# Patient Record
Sex: Male | Born: 1960 | Race: Black or African American | Hispanic: No | Marital: Single | Smoking: Former smoker
Health system: Southern US, Community
[De-identification: ages and names within clinical notes are randomized; demographics above are authoritative.]

## PROBLEM LIST (undated history)

## (undated) DIAGNOSIS — R739 Hyperglycemia, unspecified: Secondary | ICD-10-CM

## (undated) DIAGNOSIS — F17200 Nicotine dependence, unspecified, uncomplicated: Secondary | ICD-10-CM

## (undated) DIAGNOSIS — I77810 Thoracic aortic ectasia: Secondary | ICD-10-CM

## (undated) DIAGNOSIS — I2510 Atherosclerotic heart disease of native coronary artery without angina pectoris: Secondary | ICD-10-CM

## (undated) DIAGNOSIS — E785 Hyperlipidemia, unspecified: Secondary | ICD-10-CM

## (undated) DIAGNOSIS — R569 Unspecified convulsions: Secondary | ICD-10-CM

## (undated) DIAGNOSIS — I219 Acute myocardial infarction, unspecified: Secondary | ICD-10-CM

## (undated) DIAGNOSIS — I10 Essential (primary) hypertension: Secondary | ICD-10-CM

## (undated) DIAGNOSIS — Z91199 Patient's noncompliance with other medical treatment and regimen due to unspecified reason: Secondary | ICD-10-CM

## (undated) DIAGNOSIS — E119 Type 2 diabetes mellitus without complications: Secondary | ICD-10-CM

## (undated) DIAGNOSIS — D689 Coagulation defect, unspecified: Secondary | ICD-10-CM

## (undated) DIAGNOSIS — Z9119 Patient's noncompliance with other medical treatment and regimen: Secondary | ICD-10-CM

## (undated) DIAGNOSIS — D649 Anemia, unspecified: Secondary | ICD-10-CM

## (undated) DIAGNOSIS — I5042 Chronic combined systolic (congestive) and diastolic (congestive) heart failure: Secondary | ICD-10-CM

## (undated) DIAGNOSIS — G40909 Epilepsy, unspecified, not intractable, without status epilepticus: Secondary | ICD-10-CM

## (undated) DIAGNOSIS — I38 Endocarditis, valve unspecified: Secondary | ICD-10-CM

## (undated) HISTORY — DX: Atherosclerotic heart disease of native coronary artery without angina pectoris: I25.10

## (undated) HISTORY — DX: Anemia, unspecified: D64.9

## (undated) HISTORY — DX: Thoracic aortic ectasia: I77.810

## (undated) HISTORY — DX: Coagulation defect, unspecified: D68.9

## (undated) HISTORY — DX: Endocarditis, valve unspecified: I38

## (undated) HISTORY — DX: Epilepsy, unspecified, not intractable, without status epilepticus: G40.909

## (undated) HISTORY — DX: Hyperlipidemia, unspecified: E78.5

## (undated) HISTORY — DX: Patient's noncompliance with other medical treatment and regimen due to unspecified reason: Z91.199

## (undated) HISTORY — DX: Chronic combined systolic (congestive) and diastolic (congestive) heart failure: I50.42

## (undated) HISTORY — PX: COLONOSCOPY: SHX174

## (undated) HISTORY — DX: Hyperglycemia, unspecified: R73.9

## (undated) HISTORY — DX: Patient's noncompliance with other medical treatment and regimen: Z91.19

---

## 1999-02-11 ENCOUNTER — Emergency Department (HOSPITAL_COMMUNITY): Admission: EM | Admit: 1999-02-11 | Discharge: 1999-02-12 | Payer: Self-pay | Admitting: Emergency Medicine

## 2000-03-27 ENCOUNTER — Emergency Department (HOSPITAL_COMMUNITY): Admission: EM | Admit: 2000-03-27 | Discharge: 2000-03-28 | Payer: Self-pay | Admitting: Emergency Medicine

## 2004-06-27 ENCOUNTER — Ambulatory Visit: Payer: Self-pay | Admitting: Nurse Practitioner

## 2004-07-31 ENCOUNTER — Ambulatory Visit: Payer: Self-pay | Admitting: Family Medicine

## 2004-10-05 ENCOUNTER — Ambulatory Visit: Payer: Self-pay | Admitting: *Deleted

## 2004-10-05 ENCOUNTER — Ambulatory Visit: Payer: Self-pay | Admitting: Family Medicine

## 2005-09-17 ENCOUNTER — Ambulatory Visit: Payer: Self-pay | Admitting: Family Medicine

## 2006-04-10 ENCOUNTER — Emergency Department (HOSPITAL_COMMUNITY): Admission: EM | Admit: 2006-04-10 | Discharge: 2006-04-11 | Payer: Self-pay | Admitting: Emergency Medicine

## 2007-10-22 HISTORY — PX: CORONARY ANGIOPLASTY WITH STENT PLACEMENT: SHX49

## 2007-10-28 ENCOUNTER — Ambulatory Visit: Payer: Self-pay | Admitting: Cardiology

## 2007-10-28 ENCOUNTER — Emergency Department (HOSPITAL_COMMUNITY): Admission: EM | Admit: 2007-10-28 | Discharge: 2007-10-28 | Payer: Self-pay | Admitting: *Deleted

## 2007-10-28 ENCOUNTER — Inpatient Hospital Stay (HOSPITAL_COMMUNITY): Admission: EM | Admit: 2007-10-28 | Discharge: 2007-10-30 | Payer: Self-pay | Admitting: Emergency Medicine

## 2007-11-11 ENCOUNTER — Ambulatory Visit: Payer: Self-pay | Admitting: Cardiovascular Disease

## 2008-01-18 ENCOUNTER — Ambulatory Visit: Payer: Self-pay | Admitting: Cardiovascular Disease

## 2008-03-05 ENCOUNTER — Emergency Department (HOSPITAL_COMMUNITY): Admission: EM | Admit: 2008-03-05 | Discharge: 2008-03-05 | Payer: Self-pay | Admitting: Emergency Medicine

## 2008-04-21 ENCOUNTER — Emergency Department (HOSPITAL_COMMUNITY): Admission: EM | Admit: 2008-04-21 | Discharge: 2008-04-22 | Payer: Self-pay | Admitting: Emergency Medicine

## 2008-06-22 ENCOUNTER — Ambulatory Visit: Payer: Self-pay | Admitting: Internal Medicine

## 2008-06-22 LAB — CONVERTED CEMR LAB
ALT: 18 units/L (ref 0–53)
AST: 30 units/L (ref 0–37)
Albumin: 3.4 g/dL — ABNORMAL LOW (ref 3.5–5.2)
Bilirubin, Direct: 0.1 mg/dL (ref 0.0–0.3)
Cholesterol: 126 mg/dL (ref 0–200)
HDL: 43.6 mg/dL (ref >39.0)
Total CHOL/HDL Ratio: 2.9
Total Protein: 7.3 g/dL (ref 6.0–8.3)
Triglycerides: 41 mg/dL (ref 0–149)
VLDL: 8 mg/dL (ref 0–40)

## 2008-08-11 ENCOUNTER — Emergency Department (HOSPITAL_COMMUNITY): Admission: EM | Admit: 2008-08-11 | Discharge: 2008-08-11 | Payer: Self-pay | Admitting: Emergency Medicine

## 2008-08-15 ENCOUNTER — Emergency Department (HOSPITAL_COMMUNITY): Admission: EM | Admit: 2008-08-15 | Discharge: 2008-08-15 | Payer: Self-pay | Admitting: *Deleted

## 2008-09-29 ENCOUNTER — Emergency Department (HOSPITAL_COMMUNITY): Admission: EM | Admit: 2008-09-29 | Discharge: 2008-09-29 | Payer: Self-pay | Admitting: Emergency Medicine

## 2008-12-05 ENCOUNTER — Ambulatory Visit: Payer: Self-pay | Admitting: Cardiovascular Disease

## 2008-12-05 ENCOUNTER — Emergency Department (HOSPITAL_COMMUNITY): Admission: EM | Admit: 2008-12-05 | Discharge: 2008-12-05 | Payer: Self-pay | Admitting: Emergency Medicine

## 2008-12-30 ENCOUNTER — Emergency Department (HOSPITAL_COMMUNITY): Admission: EM | Admit: 2008-12-30 | Discharge: 2008-12-30 | Payer: Self-pay | Admitting: Emergency Medicine

## 2009-02-04 ENCOUNTER — Inpatient Hospital Stay (HOSPITAL_COMMUNITY): Admission: EM | Admit: 2009-02-04 | Discharge: 2009-02-05 | Payer: Self-pay | Admitting: Emergency Medicine

## 2009-02-04 ENCOUNTER — Ambulatory Visit: Payer: Self-pay | Admitting: Cardiology

## 2009-02-06 ENCOUNTER — Telehealth: Payer: Self-pay | Admitting: Cardiovascular Disease

## 2009-02-06 DIAGNOSIS — E7800 Pure hypercholesterolemia, unspecified: Secondary | ICD-10-CM | POA: Insufficient documentation

## 2009-02-06 DIAGNOSIS — R569 Unspecified convulsions: Secondary | ICD-10-CM

## 2009-02-07 ENCOUNTER — Encounter: Payer: Self-pay | Admitting: Cardiovascular Disease

## 2009-02-07 ENCOUNTER — Ambulatory Visit: Payer: Self-pay | Admitting: Cardiovascular Disease

## 2009-02-08 ENCOUNTER — Ambulatory Visit: Payer: Self-pay | Admitting: Cardiovascular Disease

## 2009-02-08 ENCOUNTER — Inpatient Hospital Stay (HOSPITAL_COMMUNITY): Admission: RE | Admit: 2009-02-08 | Discharge: 2009-02-09 | Payer: Self-pay | Admitting: Cardiovascular Disease

## 2009-02-22 ENCOUNTER — Ambulatory Visit: Payer: Self-pay | Admitting: Cardiovascular Disease

## 2009-02-22 ENCOUNTER — Encounter: Payer: Self-pay | Admitting: Physician Assistant

## 2009-02-22 DIAGNOSIS — Z87891 Personal history of nicotine dependence: Secondary | ICD-10-CM | POA: Insufficient documentation

## 2009-02-22 DIAGNOSIS — F17200 Nicotine dependence, unspecified, uncomplicated: Secondary | ICD-10-CM

## 2009-02-22 DIAGNOSIS — Z720 Tobacco use: Secondary | ICD-10-CM

## 2009-02-22 HISTORY — DX: Nicotine dependence, unspecified, uncomplicated: F17.200

## 2009-03-16 ENCOUNTER — Emergency Department (HOSPITAL_COMMUNITY): Admission: EM | Admit: 2009-03-16 | Discharge: 2009-03-16 | Payer: Self-pay | Admitting: Emergency Medicine

## 2009-04-30 ENCOUNTER — Emergency Department (HOSPITAL_COMMUNITY): Admission: EM | Admit: 2009-04-30 | Discharge: 2009-04-30 | Payer: Self-pay | Admitting: Emergency Medicine

## 2009-05-06 ENCOUNTER — Emergency Department (HOSPITAL_COMMUNITY): Admission: EM | Admit: 2009-05-06 | Discharge: 2009-05-06 | Payer: Self-pay | Admitting: Emergency Medicine

## 2009-05-08 ENCOUNTER — Emergency Department (HOSPITAL_COMMUNITY): Admission: EM | Admit: 2009-05-08 | Discharge: 2009-05-08 | Payer: Self-pay | Admitting: Emergency Medicine

## 2009-05-09 ENCOUNTER — Encounter: Payer: Self-pay | Admitting: Internal Medicine

## 2009-06-13 ENCOUNTER — Ambulatory Visit: Payer: Self-pay | Admitting: Cardiovascular Disease

## 2009-06-16 ENCOUNTER — Encounter: Payer: Self-pay | Admitting: Cardiovascular Disease

## 2009-06-19 ENCOUNTER — Telehealth (INDEPENDENT_AMBULATORY_CARE_PROVIDER_SITE_OTHER): Payer: Self-pay | Admitting: *Deleted

## 2009-06-29 ENCOUNTER — Telehealth (INDEPENDENT_AMBULATORY_CARE_PROVIDER_SITE_OTHER): Payer: Self-pay | Admitting: *Deleted

## 2009-07-03 ENCOUNTER — Telehealth: Payer: Self-pay | Admitting: Cardiovascular Disease

## 2009-07-16 ENCOUNTER — Emergency Department (HOSPITAL_COMMUNITY): Admission: EM | Admit: 2009-07-16 | Discharge: 2009-07-16 | Payer: Self-pay | Admitting: Emergency Medicine

## 2009-09-25 ENCOUNTER — Telehealth: Payer: Self-pay | Admitting: Cardiovascular Disease

## 2010-01-22 ENCOUNTER — Telehealth (INDEPENDENT_AMBULATORY_CARE_PROVIDER_SITE_OTHER): Payer: Self-pay | Admitting: *Deleted

## 2010-03-22 ENCOUNTER — Emergency Department (HOSPITAL_COMMUNITY): Admission: EM | Admit: 2010-03-22 | Discharge: 2010-03-22 | Payer: Self-pay | Admitting: Emergency Medicine

## 2010-05-09 ENCOUNTER — Telehealth (INDEPENDENT_AMBULATORY_CARE_PROVIDER_SITE_OTHER): Payer: Self-pay | Admitting: *Deleted

## 2010-09-25 ENCOUNTER — Telehealth (INDEPENDENT_AMBULATORY_CARE_PROVIDER_SITE_OTHER): Payer: Self-pay | Admitting: *Deleted

## 2010-10-02 ENCOUNTER — Encounter: Payer: Self-pay | Admitting: Cardiovascular Disease

## 2010-10-11 ENCOUNTER — Encounter: Payer: Self-pay | Admitting: Cardiovascular Disease

## 2010-10-18 ENCOUNTER — Telehealth (INDEPENDENT_AMBULATORY_CARE_PROVIDER_SITE_OTHER): Payer: Self-pay | Admitting: *Deleted

## 2010-10-20 ENCOUNTER — Emergency Department (HOSPITAL_COMMUNITY)
Admission: EM | Admit: 2010-10-20 | Discharge: 2010-10-20 | Payer: Self-pay | Source: Home / Self Care | Admitting: Emergency Medicine

## 2010-11-20 NOTE — Progress Notes (Signed)
Summary: needs refill of pravix  Phone Note Refill Request   Refills Requested: Medication #1:  PLAVIX 75 MG TABS Take one tablet by mouth daily pt needs refill plavix with the patient assistance program-pls call when [phone]  Initial call taken by: Glynda Jaeger,  May 09, 2010 1:13 PM  Follow-up for Phone Call        Med. ordered Latanya Presser will arrive between the nex 5 to 7 business days Follow-up by: Vikki Ports,  May 09, 2010 2:04 PM     Appended Document: needs refill of pravix Plavix arrived in the office and placed at the front desk for pick-up.  Luz notified pt.  Order #[id number].

## 2010-11-20 NOTE — Progress Notes (Signed)
Summary: PT need his Plavix ordered  Phone Note Refill Request Message from:  Patient on January 22, 2010 4:51 PM  Refills Requested: Medication #1:  PLAVIX 75 MG TABS Take one tablet by mouth daily Pt calling to get Plavix ordered  Initial call taken by: Judie Grieve,  January 22, 2010 4:52 PM  Follow-up for Phone Call        Plavix ordered will arrive between the next 5 to 7 days. Follow-up by: Vikki Ports,  January 23, 2010 1:47 PM     Appended Document: PT need his Plavix ordered Plavix arrived.  I left a message on the pt's voicemail that plavix is at the front desk for pick-up. Order #[id number], Customer PO  number [phone].

## 2010-11-20 NOTE — Progress Notes (Signed)
Summary: plavix  Phone Note Refill Request Message from:  Patient on September 25, 2010 12:46 PM  Refills Requested: Medication #1:  PLAVIX 75 MG TABS Take one tablet by mouth daily patient assistance program-pls call when ready   Method Requested: Pick up at Office Initial call taken by: Roe Coombs,  September 25, 2010 12:47 PM  Follow-up for Phone Call        [facility] contacted. patient assistance expired on september/11. They  will fax a new application. Message left on patient's answer machine to pick up new form and fill it out. Vikki Ports  September 27, 2010 3:54 PM Patient will come next Monday to fill out form. Vikki Ports  September 27, 2010 4:45 PM

## 2010-11-22 NOTE — Progress Notes (Signed)
Summary: Plavix   Phone Note Outgoing Call   Summary of Call: (ORDER # [id number]) Plavix from [facility] placed at front desk. Message left patient for pick up. Vikki Ports  October 18, 2010 12:50 PM

## 2010-11-22 NOTE — Letter (Signed)
Summary: [facility]   Imported By: Marylou Mccoy 11/14/2010 09:45:10  _____________________________________________________________________  External Attachment:    Type:   Image     Comment:   External Document

## 2010-11-25 ENCOUNTER — Emergency Department (HOSPITAL_COMMUNITY)
Admission: EM | Admit: 2010-11-25 | Discharge: 2010-11-25 | Disposition: A | Discharge status: Home / Self Care | Payer: Self-pay | Attending: Emergency Medicine | Admitting: Emergency Medicine

## 2010-11-25 DIAGNOSIS — I252 Old myocardial infarction: Secondary | ICD-10-CM | POA: Insufficient documentation

## 2010-11-25 DIAGNOSIS — Z79899 Other long term (current) drug therapy: Secondary | ICD-10-CM | POA: Insufficient documentation

## 2010-11-25 DIAGNOSIS — G40909 Epilepsy, unspecified, not intractable, without status epilepticus: Secondary | ICD-10-CM | POA: Insufficient documentation

## 2010-11-25 DIAGNOSIS — I2510 Atherosclerotic heart disease of native coronary artery without angina pectoris: Secondary | ICD-10-CM | POA: Insufficient documentation

## 2010-11-25 DIAGNOSIS — R51 Headache: Secondary | ICD-10-CM | POA: Insufficient documentation

## 2010-11-28 NOTE — Letter (Signed)
Summary: [employer] Patient Assistance Program Form   Imported By: Roderic Ovens 11/20/2010 16:13:46  _____________________________________________________________________  External Attachment:    Type:   Image     Comment:   External Document

## 2010-12-31 LAB — URINALYSIS, ROUTINE W REFLEX MICROSCOPIC
Bilirubin Urine: NEGATIVE
Glucose, UA: NEGATIVE mg/dL
Specific Gravity, Urine: 1.02 (ref 1.005–1.030)

## 2010-12-31 LAB — URINE MICROSCOPIC-ADD ON

## 2010-12-31 LAB — DIFFERENTIAL
Lymphocytes Relative: 37 % (ref 12–46)
Monocytes Relative: 8 % (ref 3–12)

## 2010-12-31 LAB — RAPID URINE DRUG SCREEN, HOSP PERFORMED
Amphetamines: NOT DETECTED
Tetrahydrocannabinol: POSITIVE — AB

## 2010-12-31 LAB — BASIC METABOLIC PANEL
Calcium: 9 mg/dL (ref 8.4–10.5)
Creatinine, Ser: 1.16 mg/dL (ref 0.4–1.5)
GFR calc Af Amer: >60 mL/min (ref >60)
GFR calc non Af Amer: >60 mL/min (ref >60)
Potassium: 3.9 mEq/L (ref 3.5–5.1)
Sodium: 139 mEq/L (ref 135–145)

## 2010-12-31 LAB — CBC
MCH: 30.5 pg (ref 26.0–34.0)
MCHC: 33.7 g/dL (ref 30.0–36.0)
RDW: 15.3 % (ref 11.5–15.5)

## 2010-12-31 LAB — PHENOBARBITAL LEVEL: Phenobarbital: 8.8 ug/mL — ABNORMAL LOW (ref 15.0–40.0)

## 2011-01-07 LAB — PHENOBARBITAL LEVEL: Phenobarbital: 6.3 ug/mL — ABNORMAL LOW (ref 15.0–40.0)

## 2011-01-07 LAB — GLUCOSE, CAPILLARY: Glucose-Capillary: 141 mg/dL — ABNORMAL HIGH (ref 70–99)

## 2011-01-27 LAB — BASIC METABOLIC PANEL
BUN: 10 mg/dL (ref 6–23)
CO2: 21 mEq/L (ref 19–32)
Calcium: 8.6 mg/dL (ref 8.4–10.5)
Calcium: 9.1 mg/dL (ref 8.4–10.5)
Calcium: 9 mg/dL (ref 8.4–10.5)
Chloride: 106 mEq/L (ref 96–112)
Creatinine, Ser: 0.92 mg/dL (ref 0.4–1.5)
Creatinine, Ser: 1.11 mg/dL (ref 0.4–1.5)
Creatinine, Ser: 0.98 mg/dL (ref 0.4–1.5)
GFR calc Af Amer: >60 mL/min (ref >60)
GFR calc Af Amer: >60 mL/min (ref >60)
GFR calc non Af Amer: >60 mL/min (ref >60)
GFR calc non Af Amer: >60 mL/min (ref >60)
GFR calc non Af Amer: >60 mL/min (ref >60)
Glucose, Bld: 98 mg/dL (ref 70–99)
Glucose, Bld: 88 mg/dL (ref 70–99)
Potassium: 3.7 mEq/L (ref 3.5–5.1)
Sodium: 138 mEq/L (ref 135–145)
Sodium: 140 mEq/L (ref 135–145)
Sodium: 140 mEq/L (ref 135–145)

## 2011-01-27 LAB — CBC
Hemoglobin: 12.4 g/dL — ABNORMAL LOW (ref 13.0–17.0)
RBC: 4.01 MIL/uL — ABNORMAL LOW (ref 4.22–5.81)
RDW: 17.2 % — ABNORMAL HIGH (ref 11.5–15.5)

## 2011-01-27 LAB — TROPONIN I: Troponin I: 0.02 ng/mL (ref 0.00–0.06)

## 2011-01-27 LAB — DIFFERENTIAL
Basophils Absolute: 0 10*3/uL (ref 0.0–0.1)
Basophils Relative: 1 % (ref 0–1)
Lymphocytes Relative: 20 % (ref 12–46)
Monocytes Absolute: 0.8 10*3/uL (ref 0.1–1.0)
Neutro Abs: 5 10*3/uL (ref 1.7–7.7)
Neutrophils Relative %: 66 % (ref 43–77)

## 2011-01-27 LAB — PHENYTOIN LEVEL, TOTAL: Phenytoin Lvl: <2.5 ug/mL — ABNORMAL LOW (ref 10.0–20.0)

## 2011-01-29 LAB — POCT I-STAT, CHEM 8
BUN: 12 mg/dL (ref 6–23)
Chloride: 112 mEq/L (ref 96–112)
Creatinine, Ser: 1 mg/dL (ref 0.4–1.5)
Potassium: 4.1 mEq/L (ref 3.5–5.1)
Sodium: 140 mEq/L (ref 135–145)
TCO2: 17 mmol/L (ref 0–100)

## 2011-01-29 LAB — RAPID URINE DRUG SCREEN, HOSP PERFORMED
Amphetamines: NOT DETECTED
Barbiturates: POSITIVE — AB
Opiates: NOT DETECTED

## 2011-01-29 LAB — POCT CARDIAC MARKERS
CKMB, poc: <1 ng/mL — ABNORMAL LOW (ref 1.0–8.0)
Myoglobin, poc: 133 ng/mL (ref 12–200)
Myoglobin, poc: 81.9 ng/mL (ref 12–200)
Troponin i, poc: <0.05 ng/mL (ref 0.00–0.09)

## 2011-01-29 LAB — PHENOBARBITAL LEVEL: Phenobarbital: 8 ug/mL — ABNORMAL LOW (ref 15.0–40.0)

## 2011-01-30 LAB — CBC
HCT: 34 % — ABNORMAL LOW (ref 39.0–52.0)
HCT: 34.6 % — ABNORMAL LOW (ref 39.0–52.0)
HCT: 38.5 % — ABNORMAL LOW (ref 39.0–52.0)
Hemoglobin: 11.9 g/dL — ABNORMAL LOW (ref 13.0–17.0)
MCHC: 33.9 g/dL (ref 30.0–36.0)
MCV: 88.8 fL (ref 78.0–100.0)
MCV: 90.3 fL (ref 78.0–100.0)
Platelets: 401 10*3/uL — ABNORMAL HIGH (ref 150–400)
Platelets: 480 10*3/uL — ABNORMAL HIGH (ref 150–400)
Platelets: 517 10*3/uL — ABNORMAL HIGH (ref 150–400)
RBC: 3.89 MIL/uL — ABNORMAL LOW (ref 4.22–5.81)
RDW: 15.2 % (ref 11.5–15.5)
WBC: 8.3 10*3/uL (ref 4.0–10.5)

## 2011-01-30 LAB — BASIC METABOLIC PANEL
BUN: 13 mg/dL (ref 6–23)
BUN: 7 mg/dL (ref 6–23)
CO2: 28 mEq/L (ref 19–32)
Creatinine, Ser: 0.8 mg/dL (ref 0.4–1.5)
Creatinine, Ser: 0.85 mg/dL (ref 0.4–1.5)
GFR calc Af Amer: >60 mL/min (ref >60)
GFR calc non Af Amer: >60 mL/min (ref >60)
GFR calc non Af Amer: >60 mL/min (ref >60)
Glucose, Bld: 111 mg/dL — ABNORMAL HIGH (ref 70–99)
Glucose, Bld: 93 mg/dL (ref 70–99)
Potassium: 3.4 mEq/L — ABNORMAL LOW (ref 3.5–5.1)

## 2011-01-30 LAB — POCT CARDIAC MARKERS
Myoglobin, poc: 77.7 ng/mL (ref 12–200)
Troponin i, poc: 0.19 ng/mL — ABNORMAL HIGH (ref 0.00–0.09)

## 2011-01-30 LAB — DIFFERENTIAL
Basophils Relative: 0 % (ref 0–1)
Eosinophils Absolute: 0.2 10*3/uL (ref 0.0–0.7)
Eosinophils Relative: 3 % (ref 0–5)
Lymphs Abs: 1.5 10*3/uL (ref 0.7–4.0)
Neutrophils Relative %: 78 % — ABNORMAL HIGH (ref 43–77)

## 2011-01-30 LAB — URINALYSIS, ROUTINE W REFLEX MICROSCOPIC
Ketones, ur: 15 mg/dL — AB
Nitrite: NEGATIVE
Specific Gravity, Urine: 1.025 (ref 1.005–1.030)
Urobilinogen, UA: 1 mg/dL (ref 0.0–1.0)
pH: 5.5 (ref 5.0–8.0)

## 2011-01-30 LAB — CK TOTAL AND CKMB (NOT AT ARMC)
CK, MB: 7.4 ng/mL — ABNORMAL HIGH (ref 0.3–4.0)
Total CK: 190 U/L (ref 7–232)

## 2011-01-30 LAB — POCT I-STAT, CHEM 8
Calcium, Ion: 1.07 mmol/L — ABNORMAL LOW (ref 1.12–1.32)
HCT: 40 % (ref 39.0–52.0)
Hemoglobin: 13.6 g/dL (ref 13.0–17.0)
Sodium: 138 mEq/L (ref 135–145)
TCO2: 23 mmol/L (ref 0–100)

## 2011-01-30 LAB — URINE CULTURE: Culture: NO GROWTH

## 2011-01-30 LAB — CARDIAC PANEL(CRET KIN+CKTOT+MB+TROPI)
CK, MB: 10.7 ng/mL — ABNORMAL HIGH (ref 0.3–4.0)
CK, MB: 29.5 ng/mL — ABNORMAL HIGH (ref 0.3–4.0)
Relative Index: 3.8 — ABNORMAL HIGH (ref 0.0–2.5)
Relative Index: 6.3 — ABNORMAL HIGH (ref 0.0–2.5)
Total CK: 280 U/L — ABNORMAL HIGH (ref 7–232)
Troponin I: 3.3 ng/mL (ref 0.00–0.06)

## 2011-01-30 LAB — PROTIME-INR
INR: 0.9 (ref 0.00–1.49)
Prothrombin Time: 12 seconds (ref 11.6–15.2)

## 2011-01-30 LAB — RAPID URINE DRUG SCREEN, HOSP PERFORMED
Amphetamines: NOT DETECTED
Cocaine: NOT DETECTED
Opiates: NOT DETECTED
Tetrahydrocannabinol: POSITIVE — AB

## 2011-01-31 LAB — URINALYSIS, ROUTINE W REFLEX MICROSCOPIC
Ketones, ur: NEGATIVE mg/dL
Nitrite: NEGATIVE
Protein, ur: 30 mg/dL — AB
Urobilinogen, UA: 1 mg/dL (ref 0.0–1.0)

## 2011-01-31 LAB — GLUCOSE, CAPILLARY: Glucose-Capillary: 116 mg/dL — ABNORMAL HIGH (ref 70–99)

## 2011-01-31 LAB — BASIC METABOLIC PANEL
BUN: 9 mg/dL (ref 6–23)
CO2: 18 mEq/L — ABNORMAL LOW (ref 19–32)
Calcium: 9.9 mg/dL (ref 8.4–10.5)
Chloride: 108 mEq/L (ref 96–112)
Creatinine, Ser: 1.07 mg/dL (ref 0.4–1.5)

## 2011-01-31 LAB — RAPID URINE DRUG SCREEN, HOSP PERFORMED
Amphetamines: NOT DETECTED
Barbiturates: POSITIVE — AB
Benzodiazepines: NOT DETECTED
Opiates: NOT DETECTED

## 2011-01-31 LAB — DIFFERENTIAL
Basophils Relative: 0 % (ref 0–1)
Eosinophils Absolute: 0.5 10*3/uL (ref 0.0–0.7)
Neutrophils Relative %: 44 % (ref 43–77)

## 2011-01-31 LAB — CBC
MCHC: 34.1 g/dL (ref 30.0–36.0)
MCV: 91.8 fL (ref 78.0–100.0)
Platelets: 408 10*3/uL — ABNORMAL HIGH (ref 150–400)
WBC: 11.2 10*3/uL — ABNORMAL HIGH (ref 4.0–10.5)

## 2011-01-31 LAB — PHENYTOIN LEVEL, TOTAL: Phenytoin Lvl: <2.5 ug/mL — ABNORMAL LOW (ref 10.0–20.0)

## 2011-01-31 LAB — ETHANOL: Alcohol, Ethyl (B): 5 mg/dL (ref 0–10)

## 2011-03-01 ENCOUNTER — Emergency Department (HOSPITAL_COMMUNITY): Payer: Self-pay

## 2011-03-01 ENCOUNTER — Emergency Department (HOSPITAL_COMMUNITY)
Admission: EM | Admit: 2011-03-01 | Discharge: 2011-03-02 | Disposition: A | Discharge status: Home / Self Care | Payer: Self-pay | Attending: Emergency Medicine | Admitting: Emergency Medicine

## 2011-03-01 DIAGNOSIS — I2510 Atherosclerotic heart disease of native coronary artery without angina pectoris: Secondary | ICD-10-CM | POA: Insufficient documentation

## 2011-03-01 DIAGNOSIS — S058X9A Other injuries of unspecified eye and orbit, initial encounter: Secondary | ICD-10-CM | POA: Insufficient documentation

## 2011-03-01 DIAGNOSIS — Z9119 Patient's noncompliance with other medical treatment and regimen: Secondary | ICD-10-CM | POA: Insufficient documentation

## 2011-03-01 DIAGNOSIS — S0510XA Contusion of eyeball and orbital tissues, unspecified eye, initial encounter: Secondary | ICD-10-CM | POA: Insufficient documentation

## 2011-03-01 DIAGNOSIS — M542 Cervicalgia: Secondary | ICD-10-CM | POA: Insufficient documentation

## 2011-03-01 DIAGNOSIS — Z91199 Patient's noncompliance with other medical treatment and regimen due to unspecified reason: Secondary | ICD-10-CM | POA: Insufficient documentation

## 2011-03-01 DIAGNOSIS — E7800 Pure hypercholesterolemia, unspecified: Secondary | ICD-10-CM | POA: Insufficient documentation

## 2011-03-01 DIAGNOSIS — G40802 Other epilepsy, not intractable, without status epilepticus: Secondary | ICD-10-CM | POA: Insufficient documentation

## 2011-03-01 DIAGNOSIS — Z79899 Other long term (current) drug therapy: Secondary | ICD-10-CM | POA: Insufficient documentation

## 2011-03-01 DIAGNOSIS — Y998 Other external cause status: Secondary | ICD-10-CM | POA: Insufficient documentation

## 2011-03-01 DIAGNOSIS — Z7902 Long term (current) use of antithrombotics/antiplatelets: Secondary | ICD-10-CM | POA: Insufficient documentation

## 2011-03-01 DIAGNOSIS — I252 Old myocardial infarction: Secondary | ICD-10-CM | POA: Insufficient documentation

## 2011-03-01 LAB — DIFFERENTIAL
Basophils Absolute: 0 10*3/uL (ref 0.0–0.1)
Eosinophils Relative: 1 % (ref 0–5)
Lymphocytes Relative: 23 % (ref 12–46)
Monocytes Absolute: 0.8 10*3/uL (ref 0.1–1.0)

## 2011-03-01 LAB — LIPASE, BLOOD: Lipase: 32 U/L (ref 11–59)

## 2011-03-01 LAB — COMPREHENSIVE METABOLIC PANEL
AST: 12 U/L (ref 0–37)
BUN: 10 mg/dL (ref 6–23)
CO2: 25 mEq/L (ref 19–32)
Calcium: 9.2 mg/dL (ref 8.4–10.5)
Creatinine, Ser: 0.92 mg/dL (ref 0.4–1.5)
GFR calc Af Amer: >60 mL/min (ref >60)
GFR calc non Af Amer: >60 mL/min (ref >60)
Glucose, Bld: 95 mg/dL (ref 70–99)

## 2011-03-01 LAB — ETHANOL: Alcohol, Ethyl (B): <11 mg/dL — ABNORMAL HIGH (ref 0–10)

## 2011-03-01 LAB — CBC
HCT: 33.8 % — ABNORMAL LOW (ref 39.0–52.0)
MCHC: 34 g/dL (ref 30.0–36.0)
RDW: 15.5 % (ref 11.5–15.5)

## 2011-03-01 LAB — PROTIME-INR: Prothrombin Time: 12.5 seconds (ref 11.6–15.2)

## 2011-03-02 LAB — URINALYSIS, ROUTINE W REFLEX MICROSCOPIC
Bilirubin Urine: NEGATIVE
Specific Gravity, Urine: 1.008 (ref 1.005–1.030)
pH: 6 (ref 5.0–8.0)

## 2011-03-02 LAB — RAPID URINE DRUG SCREEN, HOSP PERFORMED
Barbiturates: NOT DETECTED
Cocaine: NOT DETECTED
Opiates: NOT DETECTED

## 2011-03-02 LAB — URINE MICROSCOPIC-ADD ON

## 2011-03-05 NOTE — Assessment & Plan Note (Signed)
[facility] HEALTHCARE                            CARDIOLOGY OFFICE NOTE   [name], [name]                       MRN:          [id number]  DATE:11/11/2007                            DOB:          02-03-1961    The patient attended the [facility] on November 11, 2007 per Dr.  Excell Seltzer.  This is a 50 year old African American male patient who  presented to [facility] with a non-ST elevation MI treated with  overlapping bare metal stents to the circumflex artery.  He has a  chronic right coronary artery occlusion, mild LAD stenosis of 30%, and a  moderate LV dysfunction, ejection fraction 45%.   The patient was doing well until he went back to work yesterday.  He  said he had some sharp, shooting chest pain from some lifting and  climbing stairs.  He works as a Nurse, adult at [employer]  He also  had a little bit of dizziness when he was doing dishes at the church.  He denies any dizziness when he goes from a lying or sitting position to  sitting or standing position, and this is the only episode he had.  No  presyncope.  He denies any chest heaviness, arm pain, shortness of  breath, dyspnea on exertion.  To his credit, he has quit smoking  altogether and dramatically changed his diet by avoiding fast foods.  He  cannot afford the Lipitor prescription that was given to him, and he is  about to run out of samples of the Plavix that he was given at the  hospital as well.  The other medications he can afford because they are  four dollar prescriptions.   CURRENT MEDICATIONS:  1. Metoprolol 12.5 mg b.i.d.  2. Plavix 75 mg daily.  3. Lipitor 80 mg.  He is not taking.  4. Phenobarbital 100 mg daily.  5. Aspirin 81 mg daily .  6. Lisinopril 5 mg daily.   PHYSICAL EXAMINATION:  This is a pleasant 50 year old African American  male in no acute distress.  Blood pressure:  98/68.  Pulse:  72.  Weight:  193.  NECK:  Without JVD, HR, bruit or thyroid  enlargement.  LUNGS:  Clear posterior and lateral.  HEART:  Regular rate and rhythm at 70 beats per minutes.  Normal S1, S2.  Positive S4.  No murmur, rub, bruit, thrill or heave noted.  Right  coronary is without  hematoma or hemorrhage.  EXTREMITIES:  Without cyanosis, clubbing or edema.  __________  distal  pulses.   IMPRESSION:  1. Coronary artery disease status post non-ST elevation Myocardial      infarction on October 28, 2007 treated with overlapping bare metal      stents to the circumflex with chronic total RCA, mild LAD disease      and left ventricular dysfunction, ejection fraction 45%.  2. Dyslipidemia.  3. Tobacco abuse.  Quit.  4. History of epilepsy.  5. Limited resources financially with [facility]erve medical and dietary      compliance.   PLAN:  At this time patient has done  quite well as far as smoking  cessation and diet.  We will change his Lipitor to lovastatin 20 mg  daily so he can get it at [facility] for $4.00.  We are giving him samples  of Plavix as well as having him fill out forms for drug assistance.  The  patient's blood pressure is a little bit on the low side today, but  because of his recent myocardial infarction and left ventricular  dysfunction, I am reluctant to change his medications.  I have asked him  to call us if he has any recurrent dizziness and if so, we may need to  stop his lisinopril.  He will see Dr. Excell Seltzer back in one month's time.      Jacolyn Reedy, PA-C  Electronically Signed      Veverly Fells. Excell Seltzer, MD  Electronically Signed   ML/MedQ  DD: 11/11/2007  DT: 11/11/2007  Job #: [id number]

## 2011-03-05 NOTE — Assessment & Plan Note (Signed)
[facility] HEALTHCARE                            CARDIOLOGY OFFICE NOTE   ANTOINE, VANDERMEULEN                       MRN:          [id number]  DATE:12/05/2008                            DOB:          19-Jun-1961    REASON FOR VISIT:  Followup coronary artery disease.   HISTORY OF PRESENT ILLNESS:  Mr. Beyl is a 50 year old gentleman with  coronary artery disease who initially presented 1 year ago with a non-ST  elevation MI.  He had severe left circumflex stenosis and was treated  with overlapping bare metal stents.  He also had chronic right coronary  artery occlusion and minor nonobstructive LAD stenosis with an LVEF of  45%.   Unfortunately, Mr. Knierim fell out of bed this morning and injured his  eye on the bedside table.  He just left the emergency room and is  presenting for his regular followup visit.  From a cardiovascular  standpoint, he is doing well.  He has not been engaged in regular  physical exercise, but he remains physically active with his work at [employer].  He denies chest pain, dyspnea, palpitations, orthopnea, PND,  lightheadedness, or syncope.  He is tolerating his medical program well  and has been compliant with his medications.   MEDICATIONS:  1. Plavix 75 mg daily.  2. Phenobarbital 100 mg daily.  3. Aspirin 81 mg 4 daily.  4. Lisinopril 5 mg daily.  5. Lovastatin 20 mg daily.   ALLERGIES:  NKDA.   PHYSICAL EXAMINATION:  GENERAL:  The patient is alert and oriented.  He  is in no acute distress.  VITAL SIGNS:  Weight is 181 pounds, blood pressure 104/70, heart rate  75, respiratory rate 12.  HEENT:  Normal.  There is edema in the right periorbital region with a  small laceration present just inferior to the eye.  NECK:  Normal carotid upstrokes.  No bruits.  JVP normal.  LUNGS:  Clear bilaterally.  HEART:  Regular rate and rhythm.  No murmurs or gallops.  ABDOMEN:  Soft, nontender.  No organomegaly.  No abdominal bruits.  EXTREMITIES:  No clubbing, cyanosis, or edema.  Peripheral pulses are  intact and equal.   EKG shows normal sinus rhythm is within normal limits.   ASSESSMENT:  1. Coronary artery disease.  The patient remains symptom free with no      angina.  I recommend continue current medical program which      includes dual antiplatelet therapy with aspirin and Plavix.      Continue secondary risk reduction measures with treatment of      hypertension and dyslipidemia.  2. Dyslipidemia.  Lipids from September 2009 showed a total      cholesterol 126, triglycerides 41, HDL 44, LDL 74.  Continue      lovastatin.  Followup lipids and LFTs at the time of next office      visit.  3. Hypertension, blood pressure remains well controlled on lisinopril.   For followup, I would like to see Mr. Stick back in 6 months.  I would  be happy  to see him sooner if any problems arise.     Veverly Fells. Excell Seltzer, MD     MDC/MedQ  DD: 12/07/2008  DT: 12/08/2008  Job #: [id number]

## 2011-03-05 NOTE — Cardiovascular Report (Signed)
NAMESHER, HELLINGER                ACCOUNT NO.:  [id number]   MEDICAL RECORD NO.:  [id number]          PATIENT TYPE:  INP   LOCATION:  6525                         FACILITY:  [facility]   PHYSICIAN:  Veverly Fells. Excell Seltzer, MD  DATE OF BIRTH:  June 30, 1961   DATE OF PROCEDURE:  10/29/2007  DATE OF DISCHARGE:                            CARDIAC CATHETERIZATION   PROCEDURE:  Left heart catheterization, selective coronary angiography,  left ventricular angiography, percutaneous transluminal coronary  angioplasty and stenting of the left circumflex, Angio-Seal of the right  femoral artery.   INDICATIONS:  Mr. Blanck is a 50 year old gentleman who developed severe  chest pain after an argument.  He had no cardiac history.  He was  evaluated in the ER but left against medical advice.  He had further  chest pain and returned to the emergency room.  He ultimately is ruled  in for a myocardial infarction with elevated cardiac enzymes.  He was  referred for cardiac catheterization.  He is been treated with aspirin,  Integrilin and heparin.  He is pain free at the onset of the procedure.   Risks and indications of the procedure were reviewed with the patient.  Informed consent was obtained.  The right groin was prepped, draped,  anesthetized with 1% lidocaine using the modified Seldinger technique.  A 6-French sheath was inserted into the right femoral artery with a  front wall puncture.  Standard 6-French Judkins catheters were used for  selective coronary angiography.  An angled pigtail catheter was used for  left ventriculography.  At the completion of the diagnostic procedure, I  elected to intervene on the left circumflex.   The left circumflex had a severe 95% stenosis in the midportion.  The  proximal portion of the circumflex has diffuse disease.  The right  coronary artery is occluded but this appears chronic.  There is a well-  formed left-to-right collateral network, and there are bridging  collaterals in the right coronary artery, all suggestive of chronic  occlusion.  The patient was already on heparin and Integrilin.  His  initial ACT was 190.  He was given an additional 1500 units of heparin.  A 6-French [id number] guide catheter was used once a therapeutic ACT was  achieved.  A Cougar guidewire was passed beyond the area of severe  stenosis into the distal circumflex.  The lesion was predilated with a  2.0 x 20-mm Sprinter balloon at 12 atmospheres.  I elected to treat the  lesion focally with a bare-metal stent.  A 2.5 x 18-mm Driver stent was  used, and it was deployed at 14 atmospheres.  The stent appeared well  expanded.  After stent placement, I decided to post dilate with a 2.75 x  15-mm DuraStar balloon.  Despite multiple attempts, I could not get the  balloon beyond the proximal portion of the stent.  It was in an unusual  place that the balloon would not advance, as it was beyond the proximal  stent edge.  I then attempted a 2.75 x 8 Quantum Maverick, and it would  not pass.  It would  not pass beyond the same place.  I then attempted a  normal compliant balloon with a 2.5 x 12-mm Sprinter.  Sprinter would  also not pass beyond the area.  I decided to go ahead and dilate with  that balloon, and it was taken to 8 atmospheres.  I then tried the  noncompliant balloon again, and it would not pass.  I then elected to  buddy wire the vessel, and a BMW wire was used.  I attempted the balloon  over the BMW wire and the Cougar wire, and it would not pass over either  wire.  There appeared to be some clot in the O ring, and I pulled all  the equipment out of the coronary and flushed everything through with  saline.  At that point, angiography was performed, and there appeared to  be dissection at just off the proximal aspect of the stent.  I then  rewired the vessel with a BMW wire and did a balloon inflation with a 2-  0 x 20-mm Sprinter to 8 atmospheres.  This improved the  appearance, but  I elected to follow that with a 2.75 x 8-mm Quantum Maverick which was  taken up to 14 atmospheres in the distal aspect of the stent and 20  atmospheres in the mid aspect of the stent.  Following post dilatation,  there still was a hazy appearance proximal to the stent, and I decided  to treat that with a second stent.  A 2.5 x 12-mm driver was used and  was deployed at 12 atmospheres.  I then advanced the stent balloon down  into the stent overlap and inflated to 16 atmospheres.  Finally at the  completion of the procedure, I decided to dilate the entire area with a  2.75 x 15-mm DuraStar which was taken to 16 atmospheres on 3 inflations.  At the completion of the procedure, there was an excellent angiographic  result.  The stents in the circumflex were widely patent, and there was  TIMI 3 flow.  An Angio-Seal device was used to close the arteriotomy.  There were no complications.   FINDINGS:  Aortic pressure 100/65 with a mean of 80, left ventricular  pressure 103/13.   The left mainstem has minor luminal irregularities.  It is widely  patent.  It trifurcates into the LAD intermediate branch and circumflex.  The LAD is a large-caliber vessel that courses down and wraps around the  left ventricular apex.  The LAD has minor plaque in the midportion just  after the first diagonal with 30% stenosis in that region.  The proximal  portion of the LAD is angiographically normal.  The first diagonal  branch is relatively large and has no significant stenosis.  The  remaining portions of the mid and distal LAD have no significant  stenosis.   The intermediate branch is a large-caliber vessel.  There is a 40%  stenosis in the proximal aspect of that branch.   The left circumflex is severely diseased.  There is a 50% stenosis in  the proximal portion followed by 95% stenosis.  The midportion has a 30%  stenosis.  This courses down and supplies a large branch in the second  OM  territory.   Right coronary artery is occluded in its proximal portion.  There is  some bridging collaterals identified.  The distal portions of the right  coronary artery with the PDA and posterolateral branches fill via left-  to-right collaterals.  There is  no antegrade filling through the right  coronary artery into the distal branch vessels.   Left ventricular function assessed by ventriculography shows basal and  mid inferior wall akinesis.  The LVEF is estimated at 45%.  The other LV  wall is normal.   ASSESSMENT:  1. Severe left circumflex stenosis with successful percutaneous      coronary intervention using overlapping bare-metal stents.  2. Chronic right coronary artery occlusion.  3. Mild left anterior descending artery stenosis.  4. Moderate left ventricular systolic dysfunction.   PLAN:  Mr. Sheeran will remain on Integrilin overnight.  He was loaded  with 300 mg of Plavix.  He will continue on aspirin and Plavix for a  minimum of 30 days and preferably for 1 year in the setting of his non-  ST-elevation MI.  We will observe him overnight and determine whether he  will be eligible for discharge tomorrow.      Veverly Fells. Excell Seltzer, MD  Electronically Signed     MDC/MEDQ  D:  10/29/2007  T:  10/30/2007  Job:  [id number]

## 2011-03-05 NOTE — Discharge Summary (Signed)
NAMEFINNLEY, [name]                ACCOUNT NO.:  [id number]   MEDICAL RECORD NO.:  [id number]          PATIENT TYPE:  INP   LOCATION:  6525                         FACILITY:  [facility]   PHYSICIAN:  Veverly Fells. Excell Seltzer, MD  DATE OF BIRTH:  10/14/1961   DATE OF ADMISSION:  10/28/2007  DATE OF DISCHARGE:  10/30/2007                               DISCHARGE SUMMARY   PRIMARY CARDIOLOGIST:  Veverly Fells. Excell Seltzer, MD.   PROCEDURES PERFORMED:  Cardiac catheterization on October 29, 2007  performed by Dr. Tonny Bollman.  Severe left circumflex stenosis with successful percutaneous coronary  intervention using overlapping bare-metal stent.  Chronic right coronary  artery occlusion.  Mild left anterior descending artery stenosis.  Moderate left ventricular systolic function.   FINAL DISCHARGE DIAGNOSES:  1. Non-ST elevated myocardial infarction with 2-vessel coronary artery      disease.  2. Status post percutaneous coronary intervention to the left      circumflex with bare-metal stents and chronic right coronary artery      occlusion.  3. Dyslipidemia.  4. Tobacco abuse.  5. History of epilepsy.  6. Limited resources financially with concern of medical and dietary      compliance.   HOSPITAL COURSE:  This is a 50 year old African-American male who  originally came to the emergency room on the morning of October 28, 2007  after complaints of chest pain.  The patient had chest pain during the  early morning hours.  He works nights.  Around 3 a.m. after he was still  awake, he had chest discomfort while witnessing a major disagreement  with other people in his house.  The chest pain continued and he felt it  is a squeezing tightness.  He came to the emergency room.  First  troponin was 0.24.  Second troponin was 0.61.  The patient had no EKG  changes.  However, his symptoms were concerning.  The patient was  advised to be admitted and follow up with continued cardiac enzymes  evaluation, EKG  checks and stress Myoview versus catheterization if  troponins became positive.   Unfortunately, the patient left AMA secondary to hesitancy to be  admitted.  He was also concerned about his car, money and personal  belongings.  Apparently, someone who was with him took those home.  He  felt that he had taken them from him and he left to find the belongings.   The patient subsequently returned to the emergency room as requested  secondary to continued discomfort.  The patient was re-admitted and  cardiac enzymes were continued to cycle.  Follow up cardiac enzymes  revealed a troponin of 2.39 and 1.30.  The patient subsequently was  taken to cardiac catheterization lab for cardiac catheterization the  following morning.  In the interim, the patient was placed on aspirin,  Plavix and heparin.   The patient did undergo cardiac catheterization per Dr. Excell Seltzer resulting  in left circumflex disease and right coronary artery occlusion.  The  patient did have intervention to the left circumflex using drug-eluting  bare-metal stents which were overlapping.  The right  coronary artery was  not intervened secondary to total occlusion.  The patient also had some  mid left anterior descending stenosis with moderate left ventricular  systolic dysfunction.  His EF was found to be about 45%.  The patient  recovered well after the procedure.  He did have some oozing of his  right groin site the morning following.  He was seen and assessed by Dr.  Tonny Bollman.  If the oozing subsided and the patient was able to get  up and walk around, the plan was to let him be discharged.   A lengthy discussion by Dr. Excell Seltzer towards the patient concerning  lifestyle modifications to include dietary modifications and smoking  cessation was had.  The patient apparently eats a lot of fast food  moving from [employer] to [employer], to [employer], to [facility], to [employer], not eating at home, eating a lot of high-fat foods,  high-  cholesterol foods and continuing to smoke like a __________ It was  advised by Dr. Excell Seltzer and myself that the patient will need to make  significant lifestyle changes in his diet and to quit smoking.   The patient requested that Dr. Excell Seltzer speak with his mother.  Dr. Excell Seltzer  did call the patient's mother, and again explained the situation and  need for dietary compliance along with medical compliance especially  with the Plavix and the other medications.  His mother verbalized  understanding and was willing to be supportive in the patient's care.  Also care manager was requested to see the patient concerning drug  assistance.  Drug assistance forms were filled out along with  medication, coupon for free Plavix for 2 weeks.  The patient will follow  up with care manager's recommendations.   LABORATORY DATA:  Sodium 138, potassium 3.7, chloride 108, CO2 25, BUN  8, creatinine 0.88, glucose 86, hemoglobin 11.0, hematocrit 32.7, white  blood cells 8.9, platelets 502, troponin 2.39, 1.30, 1.60 respectively  with CK 461, 375, 298 respectively, cholesterol 204, triglycerides 33,  LDL 168, HDL 29.   Vital signs on discharge:  Blood pressure 119/74, pulse 75, respirations  18, temperature 98.8, O2 sat 98% on room air.   DIAGNOSTICS:  EKG on October 29, 2007 revealed normal sinus rhythm,  ventricular rate of 69 beats per minute.   DISCHARGE MEDICATIONS:  1. Metoprolol 12.5 mg twice a day.  2. Plavix 75 mg daily.  3. Lipitor 80 mg daily.  4. Phenobarbital 100 mg daily.  5. Aspirin 325 mg daily.  6. Nitroglycerin 0.4 mg p.r.n. sublingual for chest pain.  7. Lisinopril 5 mg daily.   ALLERGIES:  NO KNOWN DRUG ALLERGIES.   FOLLOW UP:  1. The patient will follow up with Dr. Tonny Bollman on November 11, 2007 at 12 p.m. in the afternoon.  2. The patient is advised to contact primary care physician for      continued medical management.   PLAN:  1. Case managers worked with the  patient to assist in medications and      affordability, possibly [employer] brand medications versus drug      assistant program if patient qualifies.  2. The patient has been advised on smoking cessation.  3. The patient has been advised on dietary compliance and need to      review his diet which was gone over by cardiac rehab.  4. The patient has been given a voucher for 2 weeks of Plavix and  may      be eligible for free medication prior to discharge until he is      plugged into a facility that will help him with medications.  5. The patient is advised not to return to work for 1 week.  He      verbalizes understanding.      Bettey Mare. Lyman Bishop, NP      Veverly Fells. Excell Seltzer, MD  Electronically Signed    KML/MEDQ  D:  10/30/2007  T:  10/30/2007  Job:  [id number]

## 2011-03-05 NOTE — Discharge Summary (Signed)
NAMEILLYA, GIENGER                ACCOUNT NO.:  [id number]   MEDICAL RECORD NO.:  [id number]          PATIENT TYPE:  INP   LOCATION:  2503                         FACILITY:  [facility]   PHYSICIAN:  Veverly Fells. Excell Seltzer, MD  DATE OF BIRTH:  1961-05-26   DATE OF ADMISSION:  02/08/2009  DATE OF DISCHARGE:  02/09/2009                               DISCHARGE SUMMARY   PRIMARY CARDIOLOGIST:  Veverly Fells. Excell Seltzer, MD   PRIMARY CARE PHYSICIAN:  None.   DISCHARGE DIAGNOSES:  Coronary artery disease, status post non ST-  elevated myocardial infarction (left hospital against medical advice  over the weekend and returned and had successful percutaneous coronary  intervention to left circumflex on February 08, 2009, with drug-eluting  stent.   SECONDARY DIAGNOSES:  1. Hypertension.  2. Dyslipidemia.  3. History of seizure disorder.   ALLERGIES:  NKDA.   PROCEDURE PERFORMED DURING THIS HOSPITALIZATION:  1. Cardiac catheterization and successful percutaneous coronary      intervention on February 08, 2009, and drug-eluting stent to left      circumflex (in-stent restenosis).  2. EKG performed on February 09, 2009, showing normal sinus rhythm at a      rate of 73 bpm, left posterior fascicular block, peak T-waves in      V3, less so in V2 and V4.  T-wave inversion one in aVL.  Large S-      wave in V3 and large R-wave V4.  Questionable limb lead reversal      and then on February 07, 2009, EKG performed showing normal sinus      rhythm rate of 82 bpm, slightly peaked T-waves in V3 and V4, normal      axis, evidence of left ventricular hypertrophy, possible left      atrial enlargement.  No acute ST-T wave changes compared to      previous EKG limb lead reversal highly likely in prior tracing.   HISTORY OF PRESENT ILLNESS:  Mr. Agerton is a 50 year old male with a  history of CAD, status post NSTMI in January 2009, treated with  overlapping bare-metal stents to circumflex.  At that time, his cath was  noted to  have chronic total occlusion of RCA with left-to-right  collaterals.  His EF was 45% with basal, mid, and inferior akinesis.  He  had done well and was in his usual state of health until the morning of  February 04, 2009, when he developed sudden cough, severe, sharp and heavy  substernal chest discomfort with radiation to his back.  Pain associated  with shortness of breath, nausea and vomiting x3.  Denied syncope.  He  came to the ER, was given aspirin and nitro and eventually Nitro paste.  Chest pain decreased from 10/10-5/10.  Upon initial eval by Cardiology,  the patient was actually seen sleeping, but when aroused, complaining of  mild chest discomfort.  He was placed on IV heparin and nitro.  After  this, he was asymptomatic.  Initial blood POC markers were negative, but  follow up troponin and CK-MB were elevated.  The patient denied recent  history of exertional chest pain, DOE, orthopnea, PND, or pedal edema.  The patient was admitted for planned cardiac catheterization with  possible PCI, but left AMA over the weekend returning on February 07, 2009.   HOSPITAL COURSE:  The patient admitted on February 08, 2009, he underwent  the procedures described above.  He tolerated them well without  significant complications.  At the time of discharge, the patient in  stable condition, however, complaining of mild groin pain.  Note, the  patient's vital signs remained stable during short hospital course.  The  patient underwent both phase 1 cardiac rehab and tobacco cessation this  admission.  The patient will be given his medication list and new  prescriptions, post cath instructions, and follow up instructions in  both oral and written form.  At the time of his discharge, he should to  have no questions or concerns that are not addressed.   DISCHARGE LABORATORIES:  WBC 12.2, HGB 11.6, HCT 34.0, and PLT count  401.  Sodium was 140, potassium 3.4, chloride 106, CO2 of 27, glucose  93, BUN 7,  creatinine 0.8, and calcium 8.5.   FOLLOWUP PLANS AND APPOINTMENTS:  Appointment with Wende Bushy, at  [facility] on May 25, 2009, at 8:30 a.m.   DISCHARGE MEDICATIONS:  1. Plavix 75 mg p.o. daily.  2. Enteric-coated aspirin 325 mg p.o. daily.  3. Phenobarbital as previously prescribed for seizure disorder.  4. Lisinopril 5 mg p.o. daily.  5. Lovastatin 20 mg p.o. daily.  6. Nitroglycerin 0.4 mg sublingual p.r.n. for chest pain.  7. Tylenol 650 mg p.o. q.6 h. p.r.n. for groin pain.   DURATION OF DISCHARGE ENCOUNTER INCLUDING PHYSICIAN TIME:  Thirty-five  minutes.      Jarrett Ables, [facility]      Veverly Fells. Excell Seltzer, MD  Electronically Signed    MS/MEDQ  D:  02/09/2009  T:  02/10/2009  Job:  [id number]   cc:   Wende Bushy, [facility]
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
## 2011-03-05 NOTE — Discharge Summary (Signed)
NAMEOMARRION, CARMER                ACCOUNT NO.:  [id number]   MEDICAL RECORD NO.:  [id number]          PATIENT TYPE:  INP   LOCATION:  2503                         FACILITY:  [facility]   PHYSICIAN:  Veverly Fells. Excell Seltzer, MD  DATE OF BIRTH:  August 21, 1961   DATE OF ADMISSION:  02/08/2009  DATE OF DISCHARGE:  02/09/2009                               DISCHARGE SUMMARY   ADDENDUM:  Please note, the patient mistakenly discharged without being  on a beta-blocker.  No recorded allergy or intolerance to beta-blocker  in progress notes from this admission or H and P completed on original  admit date of February 04, 2009.  The patient has been contacted by way of  voice mail left instructing him to page Jarrett Ables, physician  assistant regarding getting a prescription called into his pharmacy  since possible.  However, the patient does have a followup appointment  on Feb 22, 2009 and if the patient is still not been put on a beta-  blocker at this point, this issue should be addressed then.  This has  been documented in the patient's chart as well.      Jarrett Ables, [facility]      Veverly Fells. Excell Seltzer, MD  Electronically Signed    MS/MEDQ  D:  02/09/2009  T:  02/10/2009  Job:  [id number]

## 2011-03-05 NOTE — Discharge Summary (Signed)
NAMELANDYN, BUCKALEW                ACCOUNT NO.:  [id number]   MEDICAL RECORD NO.:  [id number]          PATIENT TYPE:  INP   LOCATION:  2503                         FACILITY:  [facility]   PHYSICIAN:  Veverly Fells. Excell Seltzer, MD  DATE OF BIRTH:  1961/09/18   DATE OF ADMISSION:  02/08/2009  DATE OF DISCHARGE:  02/09/2009                               DISCHARGE SUMMARY   ADDENDUM   Original report number [id number].  Addendum report number [id number].   REASON FOR ADDENDUM:  Per Dr. Earmon Phoenix instruction, the patient is not  on beta-blocker inpatient and should not be started outpatient.  This  can be reassessed at the followup appointment with Jacolyn Reedy per her  consultation with Dr. Excell Seltzer.  The patient will be notified of this  also.  This has been documented in the chart as well.      Jarrett Ables, [facility]      Veverly Fells. Excell Seltzer, MD  Electronically Signed    MS/MEDQ  D:  02/09/2009  T:  02/10/2009  Job:  [id number]
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
## 2011-03-05 NOTE — Assessment & Plan Note (Signed)
[facility]                            CARDIOLOGY OFFICE NOTE   [name], [name]                       MRN:          [id number]  DATE:01/18/2008                            DOB:          Jul 25, 1961    Clarisa Kindred returned for followup at the [facility] office on  January 18, 2008.  Mr. Osgood is a very nice, 50 year old gentleman with  coronary artery disease who had a non-ST-elevation MI back in January.  He was found to have multivessel CAD, mainly involving chronic occlusion  of the right coronary artery and severe stenosis in the left circumflex.  His LAD disease was only mild.  He was treated with stenting of the left  circumflex using bare metal stents.   Since his hospitalization, Mr. Brigante has done relatively well.  He has  had some trouble with lightheadedness and dizziness.  His blood pressure  has been low.  His blood pressure medicines have been reduced and he is  doing well at present.  He has occasional dizziness when first standing  up, but really has no other symptoms at this time.  He denies chest  pain, dyspnea, orthopnea, PND or edema.  He has had no palpitations.   Mr. Kana is smoking occasionally, but has cut way back.  He has made  dramatic changes in his diet.  He had a horrendous diet prior to his  infarct where he basically lived on [place], [place], [employer]  and other fast food.  He has completely sworn off those foods and has  been eating fish, salads and other heart-healthy foods.  He has gone  from 205 pounds to down under 180 pounds.  He is back to work at [employer] and is doing physical work there without any exertional symptoms.   CURRENT MEDICATIONS:  1. Plavix 75 mg daily.  2. Phenobarbital 100 mg daily.  3. Aspirin 324 mg daily.  4. Lisinopril 5 mg daily.  5. Lovastatin 20 mg daily.   ALLERGIES:  No known drug allergies.   PHYSICAL EXAMINATION:  GENERAL:  The patient is alert and oriented.   He  is in no distress.  VITAL SIGNS:  Weight 179, blood pressure is 98/70, heart rate 72,  respiratory rate 16.  HEENT:  Poor dentition with multiple missing teeth.  NECK:  Normal carotid upstrokes without bruits.  Jugular venous pressure  is normal.  LUNGS:  Clear bilaterally.  HEART:  Regular rate and rhythm without murmurs or gallops.  ABDOMEN:  Soft, nontender, no organomegaly.  No abdominal bruits.  EXTREMITIES:  No clubbing, cyanosis or edema.  Peripheral pulses 2+ and  equal throughout.   ASSESSMENT:  Mr. Mader is currently stable from a cardiovascular  standpoint.  I applauded him today for his lifestyle changes.  I am  hopeful that he can completely discontinue cigarettes.  I did not make  any adjustments in his medical regimen today.  Ideally, he would benefit  from the addition of a beta-blocker, but his blood pressure remains  borderline and I think an ACE inhibitor is important  in his medical  regimen in the setting of his mild left ventricular dysfunction.  He  should have a 61-month followup for his lipids and LFTs since he is  taking lovastatin.  He will otherwise will continue on dual antiplatelet  therapy with aspirin and Plavix.  If he has any problems, he will let us  know, but otherwise will plan on seeing him back in 3 months for regular  followup.     Veverly Fells. Excell Seltzer, MD  Electronically Signed    MDC/MedQ  DD: 01/18/2008  DT: 01/19/2008  Job #: [id number]

## 2011-03-05 NOTE — H&P (Signed)
NAMECARVELL, HOEFFNER NO.:  [id number]   MEDICAL RECORD NO.:  [id number]          PATIENT TYPE:  INP   LOCATION:  2916                         FACILITY:  [facility]   PHYSICIAN:  Marca Ancona, MD      DATE OF BIRTH:  May 22, 1961   DATE OF ADMISSION:  02/04/2009  DATE OF DISCHARGE:                              HISTORY & PHYSICAL   CARDIOLOGIST:  Dr. Tonny Bollman   PRIMARY CARE PHYSICIAN:  None.   CHIEF COMPLAINT:  Chest pain.   HISTORY OF PRESENT ILLNESS:  [name] is a 50 year old male patient  with a history of coronary disease, status post non-ST-elevation  myocardial infarction January 2009 treated with overlapping bare metal  stents to the circumflex.  At the time of his catheterization he was  also noted to have a chronic total occlusion of the RCA with left-to-  right collaterals.  His EF at catheterization was 45% with basal and mid  inferior akinesis.  He has done well since his non-ST elevation  myocardial infarction.  He was in his usual state of health until this  morning around 5:30 when he developed sudden severe sharp and heavy  substernal chest discomfort with radiation to his back and associated  shortness of breath with nausea and vomiting x3.  He denies any  associated syncope.  He came the emergency room and was given aspirin  and nitroglycerin and eventually transition over Nitropaste.  The chest  pain went down from a 10/10 to 5/10.  Upon my initial interview he was  comfortable and actually sleeping.  He was still complaining of some  chest discomfort.  We placed him on IV heparin and IV nitroglycerin.  He  was chest pain free after this intervention.  His initial point of care  markers have been negative.  However, his follow-up troponin and CK-MB  are both elevated.  Of note, the patient denies any recent history of  exertional chest pain or dyspnea on exertion.  He denies orthopnea, PND  or pedal edema.  It should be noted that his EKG  does demonstrate some  peaking of T-waves, especially in V3 and V4, somewhat concerning for  hyperacute T-waves, but the changes do seem to be similar to prior  tracings.   PAST MEDICAL HISTORY:  1. Coronary disease.      a.     Status post non-ST elevation myocardial infarction treated       with overlapping bare mental stents to the circumflex in January       2009.      b.     Check cardiac catheterization January 2009:  RCA totally       occluded proximally with left-to-right collaterals; LAD 30% mid;       ramus intermedius 40% proximal; circumflex 50% proximal, 95% -       treated with PCI, 30% mid.      c.     EF 45% with basal and mid inferior akinesis.  2. Hyperlipidemia.  3. Hypertension.  4. Seizure disorder.   MEDICATIONS AT HOME:  1. Plavix  75 mg daily.  2. Phenobarbital 97.2 mg daily.  3. Lisinopril 5 mg daily.  4. Aspirin 81 mg times 4 tablets daily.  5. Lovastatin 20 mg daily.   ALLERGIES:  No known drug allergies.   SOCIAL HISTORY:  The patient lives in [place] by himself.  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  He continues to smoke cigars, but denies alcohol abuse.  He denies drug  abuse, although his urine drug screen is positive for THC.   FAMILY HISTORY:  Insignificant for premature CAD.   REVIEW OF SYSTEMS:  Please see HPI.  Denies fevers, chills, headache,  dysuria, hematuria, frequency or straining, bright blood per rectum,  melena, dysphagia, odynophagia or GERD symptoms.  Denies diarrhea.  Denies claudication symptoms.  Denies cough.  All other systems reviewed  and negative.   PHYSICAL EXAMINATION:  He is a well-nourished, well-developed male in no  distress.  Blood pressure is 101/58, pulse 89, respirations 19,  temperature 97.6, oxygen saturation 100% on room air.  HEENT is normal.  NECK:  Without JVD, lymphadenopathy.  ENDOCRINE:  Without thyromegaly.  CARDIAC:  S1, S2 regular rate and rhythm  without murmur.  LUNGS:  Clear to auscultation bilaterally without wheezing, rhonchi or  rales.  SKIN:  Without rash.  Warm and dry.  ABDOMEN:  Soft, nontender with normoactive bowel sounds.  No  organomegaly.  Extremities:  Without edema.  MUSCULOSKELETAL:  Without joint deformity.  NEUROLOGIC:  He is alert and oriented x3.  Cranial nerves II-XII grossly  intact.  VASCULAR EXAM:  No carotid bruits noted bilaterally.  Femoral artery  pulses are difficult to palpate but no bruits are auscultated  bilaterally.  Dorsalis pedis and posterior tibialis pulses are  diminished bilaterally.   Chest X-Ray:  Cardiomegaly without pulmonary edema.  Minimal atelectasis  of the right base.  EKG:  Sinus rhythm, heart rate of 80, normal axis, T-  wave changes in V3 and V4, most likely consistent with early  repolarization.   LABS:  Hemoglobin 13.6.  Potassium 3.6, creatinine 1, glucose 99.  INR  0.9.  Point of care markers negative times 1.  Subsequent CK 190, CK-MB  7.4, troponin of 0.32.  Urine drug screen positive for barbiturates and  THC.  Urinalysis:  Abnormal with moderate leukocytes and rare bacteria  and 11-20 white blood cells   ASSESSMENT:  Non-ST elevation myocardial infarction in a 50 year old  male with prior history of coronary artery disease and non-ST-elevation  myocardial infarction in June 2009 treated bare metal stents x2 to the  circumflex and known totally occluded right coronary artery with left  ventricular dysfunction with and ejection fraction of 45%.  1. Dyslipidemia.  2. Hypertension.  3. Tobacco abuse.  4. Seizure disorder  5. Marijuana use.  6. Asymptomatic pyuria.   RECOMMENDATIONS:  The patient was also interviewed and examined by Dr.  Shirlee Latch.  The patient presents with his first episode of chest pain since  his PCI in the setting of an NSTEMI in January 2009.  His pain lasted  total of 2 hours.  His ECG basically shows early repolarization.  He is  now chest  pain free.  His cardiac enzymes are now returning positive,  ruling him in for a non-ST elevation myocardial infarction.  He has  already been  placed on heparin drip and nitroglycerin drip.  His  lovastatin will be changed over to Lipitor 80 mg  a day.  He has been  placed on a beta blocker and his Lopressor will __________ at 12.5 mg  q.6 h.  He will be continued on his aspirin and Plavix.  We will go  ahead and bolus him with high-dose Plavix initially.  A urine culture  will be checked given his abnormal urinalysis.  Currently he is  asymptomatic without dysuria.  Ultimately we will plan for left heart  catheterization on Monday as long as he remains pain free.  Should he  have a change in his symptoms we will consider earlier cardiac  catheterization.      Kevin Newcomer, PA-C      Marca Ancona, MD  Electronically Signed    SW/MEDQ  D:  02/04/2009  T:  02/04/2009  Job:  [id number]

## 2011-03-22 HISTORY — PX: CORONARY ANGIOPLASTY: SHX604

## 2011-03-27 ENCOUNTER — Other Ambulatory Visit: Payer: Self-pay | Admitting: *Deleted

## 2011-03-27 NOTE — Telephone Encounter (Signed)
Plavix arrived from [facility]. Order # [id number] # 90 tablets.  Placed at front desk. Patient notified.

## 2011-04-06 ENCOUNTER — Inpatient Hospital Stay (HOSPITAL_COMMUNITY)
Admission: EM | Admit: 2011-04-06 | Discharge: 2011-04-08 | DRG: 251 | Disposition: A | Discharge status: Home / Self Care | Payer: Self-pay | Attending: Cardiovascular Disease | Admitting: Cardiovascular Disease

## 2011-04-06 ENCOUNTER — Emergency Department (HOSPITAL_COMMUNITY): Payer: Self-pay

## 2011-04-06 DIAGNOSIS — I252 Old myocardial infarction: Secondary | ICD-10-CM

## 2011-04-06 DIAGNOSIS — R079 Chest pain, unspecified: Secondary | ICD-10-CM

## 2011-04-06 DIAGNOSIS — Y92009 Unspecified place in unspecified non-institutional (private) residence as the place of occurrence of the external cause: Secondary | ICD-10-CM

## 2011-04-06 DIAGNOSIS — I214 Non-ST elevation (NSTEMI) myocardial infarction: Principal | ICD-10-CM | POA: Diagnosis present

## 2011-04-06 DIAGNOSIS — Y831 Surgical operation with implant of artificial internal device as the cause of abnormal reaction of the patient, or of later complication, without mention of misadventure at the time of the procedure: Secondary | ICD-10-CM | POA: Diagnosis present

## 2011-04-06 DIAGNOSIS — Z7902 Long term (current) use of antithrombotics/antiplatelets: Secondary | ICD-10-CM

## 2011-04-06 DIAGNOSIS — I2582 Chronic total occlusion of coronary artery: Secondary | ICD-10-CM | POA: Diagnosis present

## 2011-04-06 DIAGNOSIS — I059 Rheumatic mitral valve disease, unspecified: Secondary | ICD-10-CM | POA: Diagnosis present

## 2011-04-06 DIAGNOSIS — Z9861 Coronary angioplasty status: Secondary | ICD-10-CM

## 2011-04-06 DIAGNOSIS — F17200 Nicotine dependence, unspecified, uncomplicated: Secondary | ICD-10-CM | POA: Diagnosis present

## 2011-04-06 DIAGNOSIS — G40909 Epilepsy, unspecified, not intractable, without status epilepticus: Secondary | ICD-10-CM | POA: Diagnosis present

## 2011-04-06 DIAGNOSIS — I10 Essential (primary) hypertension: Secondary | ICD-10-CM | POA: Diagnosis present

## 2011-04-06 DIAGNOSIS — I2510 Atherosclerotic heart disease of native coronary artery without angina pectoris: Secondary | ICD-10-CM

## 2011-04-06 DIAGNOSIS — Z7982 Long term (current) use of aspirin: Secondary | ICD-10-CM

## 2011-04-06 DIAGNOSIS — E785 Hyperlipidemia, unspecified: Secondary | ICD-10-CM | POA: Diagnosis present

## 2011-04-06 LAB — COMPREHENSIVE METABOLIC PANEL
ALT: 27 U/L (ref 0–53)
Albumin: 3.3 g/dL — ABNORMAL LOW (ref 3.5–5.2)
Alkaline Phosphatase: 91 U/L (ref 39–117)
BUN: 13 mg/dL (ref 6–23)
Chloride: 104 mEq/L (ref 96–112)
GFR calc Af Amer: >60 mL/min (ref >60)
Glucose, Bld: 111 mg/dL — ABNORMAL HIGH (ref 70–99)
Potassium: 3.8 mEq/L (ref 3.5–5.1)
Total Bilirubin: 0.1 mg/dL — ABNORMAL LOW (ref 0.3–1.2)

## 2011-04-06 LAB — POCT ACTIVATED CLOTTING TIME: Activated Clotting Time: 144 seconds

## 2011-04-06 LAB — TROPONIN I: Troponin I: 0.33 ng/mL (ref <0.30)

## 2011-04-06 LAB — CBC
HCT: 35.2 % — ABNORMAL LOW (ref 39.0–52.0)
MCHC: 34.4 g/dL (ref 30.0–36.0)
MCV: 87.6 fL (ref 78.0–100.0)
RDW: 15.9 % — ABNORMAL HIGH (ref 11.5–15.5)

## 2011-04-06 LAB — CARDIAC PANEL(CRET KIN+CKTOT+MB+TROPI)
Relative Index: 7.8 — ABNORMAL HIGH (ref 0.0–2.5)
Total CK: 926 U/L — ABNORMAL HIGH (ref 7–232)
Troponin I: 13.65 ng/mL (ref <0.30)

## 2011-04-06 LAB — MRSA PCR SCREENING: MRSA by PCR: NEGATIVE

## 2011-04-06 MED ORDER — IOHEXOL 350 MG/ML SOLN
100.0000 mL | Freq: Once | INTRAVENOUS | Status: AC | PRN
  Administered 2011-04-06: 80 mL via INTRAVENOUS

## 2011-04-07 DIAGNOSIS — I059 Rheumatic mitral valve disease, unspecified: Secondary | ICD-10-CM

## 2011-04-07 DIAGNOSIS — I214 Non-ST elevation (NSTEMI) myocardial infarction: Secondary | ICD-10-CM

## 2011-04-07 LAB — CBC
HCT: 34.7 % — ABNORMAL LOW (ref 39.0–52.0)
MCH: 29 pg (ref 26.0–34.0)
MCV: 88.3 fL (ref 78.0–100.0)
Platelets: 339 10*3/uL (ref 150–400)
RBC: 3.93 MIL/uL — ABNORMAL LOW (ref 4.22–5.81)

## 2011-04-07 LAB — LIPID PANEL
Cholesterol: 162 mg/dL (ref 0–200)
Total CHOL/HDL Ratio: 4.5 RATIO

## 2011-04-07 LAB — BASIC METABOLIC PANEL
Calcium: 8.4 mg/dL (ref 8.4–10.5)
Chloride: 105 mEq/L (ref 96–112)
Creatinine, Ser: 0.82 mg/dL (ref 0.50–1.35)
GFR calc Af Amer: >60 mL/min (ref >60)
GFR calc non Af Amer: >60 mL/min (ref >60)

## 2011-04-07 LAB — CARDIAC PANEL(CRET KIN+CKTOT+MB+TROPI)
Relative Index: 6.8 — ABNORMAL HIGH (ref 0.0–2.5)
Troponin I: >25 ng/mL (ref <0.30)

## 2011-04-08 ENCOUNTER — Ambulatory Visit (HOSPITAL_COMMUNITY): Admission: RE | Admit: 2011-04-08 | Payer: Self-pay | Source: Ambulatory Visit

## 2011-04-08 LAB — CBC
HCT: 33.6 % — ABNORMAL LOW (ref 39.0–52.0)
Hemoglobin: 11.3 g/dL — ABNORMAL LOW (ref 13.0–17.0)
MCH: 29.1 pg (ref 26.0–34.0)
MCV: 86.6 fL (ref 78.0–100.0)
RBC: 3.88 MIL/uL — ABNORMAL LOW (ref 4.22–5.81)

## 2011-04-08 LAB — BASIC METABOLIC PANEL
BUN: 9 mg/dL (ref 6–23)
CO2: 28 mEq/L (ref 19–32)
Calcium: 8.6 mg/dL (ref 8.4–10.5)
Creatinine, Ser: 0.77 mg/dL (ref 0.50–1.35)
Glucose, Bld: 90 mg/dL (ref 70–99)

## 2011-04-09 ENCOUNTER — Ambulatory Visit (HOSPITAL_COMMUNITY): Admission: RE | Admit: 2011-04-09 | Payer: Self-pay | Source: Ambulatory Visit

## 2011-04-11 NOTE — H&P (Signed)
NAMECERRONE, DEBOLD NO.:  [id number]  MEDICAL RECORD NO.:  [id number]  LOCATION:  2917                         FACILITY:  [facility]  PHYSICIAN:  Peter M. Swaziland, M.D.  DATE OF BIRTH:  May 26, 1961  DATE OF ADMISSION:  04/06/2011 DATE OF DISCHARGE:                             HISTORY & PHYSICAL   HISTORY OF PRESENT ILLNESS:  Mr. Steckman is a 50 year old black male who presents to the emergency department this morning for acute onset of chest pain beginning at 6 a.m. today.  The pain radiates across his anterior precordium and into his back.  It is associated with diaphoresis and nausea and vomiting times one.  He has no associated shortness of breath.  He states the pain is the same as when he has had his previous cardiac events.  His pain is better at this time but is still present.  The patient is status post stenting of the proximal left circumflex coronary artery in January 2009, with overlapping bare-metal stents.  This included a 2.5 x 18 and 2.5 x 12 driver stents.  He had non-ST-elevation myocardial infarction in April 2010, and was found to have in-stent restenosis.  This was treated with drug-eluting stent, but I am unable to locate any cath report in his record.  The patient was noted at that time to have occlusion of his right coronary artery with good collateral flow.  The patient reports compliance with his medications and does mention that he is taking aspirin and Plavix.  He is not sure about his other medications.  PAST MEDICAL HISTORY: 1. Coronary artery disease with chronic total occlusion of the right     coronary artery with collaterals.  He is status post stenting     procedures of the proximal left circumflex coronary artery in     January 2009, and April 2010. 2. Hyperlipidemia. 3. Seizure disorder. 4. Question history of hypertension. [name]  MEDICATIONS: 1. Aspirin 81 mg daily. 2. Plavix 75 mg daily. 3. Phenobarbital 100 mg daily. 4. He  is taking another drug which sounds like Lipitor.  ALLERGIES:  He has no known allergies.  SOCIAL HISTORY:  He is unemployed.  He is unmarried.  He has no children.  He smokes three cigars per day.  He drinks an occasional beer but denies any drug use.  FAMILY HISTORY:  Negative for premature coronary artery disease.  REVIEW OF SYSTEMS:  He denies any change in bowel habits.  He has had no recent bleeding.  He has no history of TIA or stroke.  He is relatively inactive.  All other systems are reviewed and are negative.  PHYSICAL EXAMINATION:  GENERAL:  He is a pleasant black male in no distress. VITAL SIGNS:  Blood pressure 124/59, pulse is 75.  He is afebrile. HEENT:  He is normocephalic, atraumatic.  His pupils are equal, round and reactive.  Sclerae are clear.  Oropharynx is clear with poor dentition with several missing teeth. NECK:  Supple without JVD, adenopathy, thyromegaly, or bruits. LUNGS:  Clear. CARDIAC:  Regular rate and rhythm.  There is a grade 2/6 holosystolic murmur heard best at the apex radiating towards the left axilla.  There is no S3. ABDOMEN:  Soft and nontender without masses or bruits.  Femoral and pedal pulses and radial pulses are all 2+ and symmetric. EXTREMITIES:  He has no edema or cyanosis. NEUROLOGIC:  He is alert and oriented x3.  Cranial nerves II-XII are intact.  He has no focal motor or sensory deficits. SKIN:  Warm and dry.  LABORATORY DATA:  ECG shows normal sinus rhythm with possible LVH by voltage.  There are no acute ST or T-wave changes.  Sodium is 138, potassium 3.8, chloride 104, CO2 25, BUN 13, creatinine 0.98, glucose 111.  Liver function studies were normal.  White count 7600, hemoglobin 12.1, hematocrit 35.2, platelets 347,000.  Cardiac enzymes are negative x1.  CT of the chest and abdomen were negative for acute pathology.  He has mild aortic ectasia.  IMPRESSION: 1. Unstable angina pectoris. 2. Coronary artery disease with  known occlusion of the right coronary     artery.  Status post stenting of the proximal left circumflex     coronary artery x2. 3. Seizure disorder. 4. Hyperlipidemia. 5. Murmur that is not mentioned previously.  His exam is consistent     with mitral insufficiency.  PLAN:  We will admit to step-down unit.  He will be treated with IV nitrates and heparin.  We will continue aspirin and Plavix.  He will be placed on statin therapy and a beta-blocker.  We will obtain serial cardiac enzymes.  We will obtain an echocardiogram to further evaluate his murmur.  We will plan on cardiac catheterization with potential intervention on Monday, April 08, 2011.          ______________________________ Peter M. [place]il[place], M.D.     PMJ/MEDQ  D:  04/06/2011  T:  04/06/2011  Job:  [id number]  cc:   Veverly Fells. Excell Seltzer, MD  Electronically Signed by PETER Swaziland M.D. on 04/11/2011 07:43:41 AM

## 2011-04-19 ENCOUNTER — Encounter: Payer: Self-pay | Admitting: Cardiovascular Disease

## 2011-04-22 ENCOUNTER — Encounter: Payer: Self-pay | Admitting: Physician Assistant

## 2011-05-09 NOTE — Cardiovascular Report (Signed)
NAMECURTIS, CAIN NO.:  [id number]  MEDICAL RECORD NO.:  [id number]  LOCATION:  2917                         FACILITY:  [facility]  PHYSICIAN:  Veverly Fells. Excell Seltzer, MD  DATE OF BIRTH:  February 11, 1961  DATE OF PROCEDURE:  04/06/2011 DATE OF DISCHARGE:                           CARDIAC CATHETERIZATION   PROCEDURES: 1. Left heart catheterization. 2. Selective coronary angiography. 3. Left ventricular angiography. 4. Aspiration thrombectomy, percutaneous transluminal coronary     angioplasty, and intravascular ultrasound of left circumflex.  PROCEDURAL INDICATIONS:  Mr. Chismar is a 50 year old gentleman with CAD. He presented with non-ST-elevation infarction now on 3 occasions.  The first was in 2009 and he underwent stenting of the left circumflex with a drug-eluting stent platform.  He then presented again with non-ST- elevation infarction and was found to have severe in-stent restenosis. He was treated with a 2.75 x 23 mm Xience V drug-eluting stent within the previously placed stents.  That was about 2 years ago.  He has done well until early this morning when he developed severe substernal chest pain.  He presented and again ruled in with non-ST-elevation infarction. With increasing troponin, he was brought to the Cardiac Cath Lab emergently.  He admits that he ran out of Plavix about a week ago.  Risks and indications of procedure were reviewed with the patient. Informed consent was obtained.  The right groin was prepped, draped, and anesthetized with 1% lidocaine using modified Seldinger technique.  A 6- French sheath was placed in the right femoral artery.  Standard Judkins catheters were used for coronary angiography and left ventriculography. The patient tolerated the diagnostic procedure well.  Following the diagnostic procedure, I elected to proceed with PCI.  DIAGNOSTIC FINDINGS:  Aortic pressure 109/68 with a mean of 86.  Left ventricular pressure  110/19.  Left mainstem:  The left mainstem is large.  The distal left main has a 40% stenosis before it divides into the LAD and left circumflex.  LAD:  The LAD is a large-caliber vessel that reaches the LV apex.  The mid LAD has 50% stenosis.  The first diagonal branch is moderate caliber and patent.  There is no high-grade stenosis throughout the course of the LAD.  There are diffuse irregularities scattered throughout the vessel.  Left circumflex:  The left circumflex is patent.  The vessel gives off a high obtuse marginal branch that divides into twin vessels.  The OM has a 70% ostial stenosis which is stable from previous studies.  There is a long stented segment in the proximal circumflex just after the OM that has a hazy-appearing 90% lesion suggestive of thrombus.  The mid and distal circumflex leading beyond the OM branch fill slow and that portion of the vessel was diffusely diseased.  Right coronary artery:  The right coronary artery shows a chronic total occlusion in the mid vessel.  This was seen on previous studies.  There are extensive left-to-right collaterals filling the distal RCA territory.  Left ventriculography:  There is marked basal inferior wall akinesis. The left ventricular ejection fraction is estimated at 45%.  PERCUTANEOUS CORONARY INTERVENTION NOTE:  I elected to intervene on the thrombotic in-stent lesion  in the left circumflex.  Heparin and Integrilin were used for anticoagulation.  Once the therapeutic ACT was achieved, an XB-4 guide catheter was inserted and a Cougar wire was advanced easily past the area of stenosis.  Aspiration thrombectomy was performed with an aspiration catheter.  Multiple passes were made and two 20-mL syringes were filled.  This improved the appearance, but there was still some evidence of stenosis throughout that region.  A 3.0 x 15 mm [place] Quantum apex balloon was advanced and dilated to 18 atmospheres in the proximal  midportion of the stent.  At that point, I elected to perform intravascular ultrasound to assess whether there might be late stent mal-apposition or under-expansion.  A mechanical pullback was performed.  This demonstrated two layers of stent with fully-apposed stent throughout the vessel and a small amount of under-expansion in the proximal stented segment.  I then advanced the 3.0 [place] Quantum apex balloon again and dilated to 20 atmospheres for a final inflation. There was an excellent angiographic result with 0% residual stenosis and TIMI 3 flow.  The obtuse marginal branch remained jailed, but had TIMI 3 flow and the patient was chest pain free.  This stenosis remained in the 70% range.  The patient tolerated the procedure well.  There were no immediate complications.  I did not deployed a closure device because the patient had a marked amount of scar around his femoral artery with difficulty even passing a needle through the tissue.  The sheath will be pulled once the ACT is less than 150.  FINAL ASSESSMENT: 1. Severe in-stent thrombosis in the proximal left circumflex with     successful percutaneous coronary intervention as outlined above. 2. Moderate obtuse marginal stenosis, stable from previous studies. 3. Nonobstructive left anterior descending stenosis. 4. Chronic total occlusion of the right coronary artery with left-to-     right collaterals. 5. Mild to moderate segmental left ventricular systolic dysfunction.  RECOMMENDATIONS:  The patient should remain on aspirin and Plavix indefinitely.  He was reloaded with 600 mg of Plavix in the Cath Lab.  I am reluctant to use either Effient or Brilinta as I think the bleeding risk would be greater in this gentleman with grand mal seizures.  He did run out of Plavix about 1 week ago, so he had previously done well when he was on this medication.  He generally gets his Plavix through our office and we will make sure that this is  lined up again before he leaves the hospital.     Veverly Fells. Excell Seltzer, MD     MDC/MEDQ  D:  04/06/2011  T:  04/07/2011  Job:  [id number]  Electronically Signed by Tonny Bollman MD on 05/09/2011 12:19:21 AM

## 2011-05-09 NOTE — Discharge Summary (Signed)
[name], [name] NO.:  [id number]  MEDICAL RECORD NO.:  [id number]  LOCATION:  3706                         FACILITY:  [facility]  PHYSICIAN:  Veverly Fells. Excell Seltzer, MD  DATE OF BIRTH:  Apr 26, 1961  DATE OF ADMISSION:  04/06/2011 DATE OF DISCHARGE:  04/08/2011                              DISCHARGE SUMMARY   PRIMARY CARDIOLOGIST:  Veverly Fells. Excell Seltzer, MD  DISCHARGE DIAGNOSIS:  Non-ST-segment elevation myocardial infarction.  SECONDARY DIAGNOSES: 1. Coronary artery disease status post prior left circumflex stenting     with in-stent thrombosis this admission requiring repeat     percutaneous transluminal angioplasty. 2. Hyperlipidemia. 3. Seizure disorder. 4. Hypertension. 5. Tobacco abuse.  ALLERGIES:  No known drug allergies.  PROCEDURES:  Left heart cardiac catheterization performed April 06, 2011, revealing a chronic total occlusion of right coronary artery which is fed distally via left-to-right collaterals.  There was 90% in-stent thrombosis within the previously placed left circumflex stent.  There was 70% stenosis in the proximal obtuse marginal and 50% mid stenosis in the LAD.  The left main had a 40% distal stenosis.  The left circumflex was successfully treated with balloon angioplasty.  EF was 45% with basal inferior akinesis.  HISTORY OF PRESENT ILLNESS:  A 50 year old African American male prior history of coronary artery disease and known total occlusion right coronary artery with bare-metal stenting of the proximal left circumflex in January 2009.  The patient had a non-ST-elevation MI in April 2010, secondary to in-stent restenosis and this was treated with drug-eluting stent.  The patient had been doing well until the morning of admission when he reported sudden onset of substernal chest discomfort with radiation to his back associated with diaphoresis, nausea, and vomiting.  The patient presented to the ED where his initial point-of-care  markers were negative.  The patient was admitted for further evaluation.  HOSPITAL COURSE:  The patient ruled in for non-ST-elevation MI eventually peaking his CK of 1968, MB of 133.2, and troponin-I of greater than 25.  The patient did report that he ran out of Plavix approximately 1 week prior to admission.  Further, he continued to have chest discomfort on April 06, 2011, prompting urgent catheterization. Diagnostic catheterization revealed 90% in-stent thrombosis within the previously placed left circumflex stent and otherwise anatomy as outlined above.  EF was 45% with basal inferior akinesis.  The left circumflex was successfully treated with PTCA, followed by intravascular ultrasound.  The patient tolerated this procedure well and was reloaded with Plavix.  We have avoided Brilinta or Effient secondary to history of grand mal seizures.  Postprocedure, the patient has been ambulating without recurrent symptoms or limitations.  He has been seen by Cardiac Rehab with outpatient referral made.  He has been counseled on the importance of medication adherence as well as smoking cessation.  The patient will be discharged home today in good condition.  DISCHARGE LABORATORIES:  Hemoglobin 11.3, hematocrit 33.6, WBC 6.2, platelets 347.  Sodium 137, potassium 3.8, chloride 102, CO2 28, BUN 9, creatinine 0.7, glucose 90.  Total bilirubin 0.1, alkaline phosphatase 91, AST 26, ALT 27, total protein 7.3, albumin 3.3 calcium 8.6, CK 1968, MB 133.2, troponin-I  greater than 25.  Total cholesterol 152, triglycerides 62, HDL 36, LDL 114.  MRSA screen was negative.  DISPOSITION:  The patient will be discharged home today in good condition.  FOLLOWUP PLANS AND APPOINTMENTS:  The patient will follow up with Tereso Newcomer, PA on April 22, 2011, at 11:30 a.m. in our [place] office.  DISCHARGE MEDICATIONS: 1. Lisinopril 2.5 mg daily. 2. Metoprolol 25 mg b.i.d. 3. Nitroglycerin 0.4 mg sublingual  p.r.n. chest pain. 4. Pravachol 40 mg two times at bedtime. 5. Aspirin 81 mg daily. 6. Phenobarbital 97.2 mg daily. 7. Phenytoin 100 mg t.i.d. 8. Plavix 75 mg daily.  OUTSTANDING LABORATORY STUDIES:  The patient will require followup with LFTs in approximately 6-8 weeks.  He should have a followup basic metabolic panel once in the office on April 22, 2011, as we have initiated ACE inhibitor.  Duration of discharge encounter 45 minutes including physician time.     Nicolasa Ducking, ANP   ______________________________ Veverly Fells. Excell Seltzer, MD    CB/MEDQ  D:  04/08/2011  T:  04/09/2011  Job:  [id number]  Electronically Signed by Nicolasa Ducking ANP on 04/12/2011 02:32:42 PM Electronically Signed by Tonny Bollman MD on 05/09/2011 12:19:27 AM

## 2011-05-21 ENCOUNTER — Emergency Department (HOSPITAL_COMMUNITY)
Admission: EM | Admit: 2011-05-21 | Discharge: 2011-05-21 | Disposition: A | Discharge status: Home / Self Care | Payer: Self-pay | Attending: Emergency Medicine | Admitting: Emergency Medicine

## 2011-05-21 ENCOUNTER — Emergency Department (HOSPITAL_COMMUNITY): Payer: Self-pay

## 2011-05-21 ENCOUNTER — Encounter (HOSPITAL_COMMUNITY): Payer: Self-pay

## 2011-05-21 DIAGNOSIS — I252 Old myocardial infarction: Secondary | ICD-10-CM | POA: Insufficient documentation

## 2011-05-21 DIAGNOSIS — Z79899 Other long term (current) drug therapy: Secondary | ICD-10-CM | POA: Insufficient documentation

## 2011-05-21 DIAGNOSIS — G40909 Epilepsy, unspecified, not intractable, without status epilepticus: Secondary | ICD-10-CM | POA: Insufficient documentation

## 2011-05-21 DIAGNOSIS — I2510 Atherosclerotic heart disease of native coronary artery without angina pectoris: Secondary | ICD-10-CM | POA: Insufficient documentation

## 2011-05-21 LAB — POCT I-STAT, CHEM 8
BUN: 8 mg/dL (ref 6–23)
Calcium, Ion: 1.1 mmol/L — ABNORMAL LOW (ref 1.12–1.32)
Chloride: 110 mEq/L (ref 96–112)
Glucose, Bld: 71 mg/dL (ref 70–99)
Potassium: 4 mEq/L (ref 3.5–5.1)

## 2011-05-21 LAB — CBC
HCT: 35.2 % — ABNORMAL LOW (ref 39.0–52.0)
MCH: 28.8 pg (ref 26.0–34.0)
MCV: 88.9 fL (ref 78.0–100.0)
Platelets: 345 10*3/uL (ref 150–400)
RBC: 3.96 MIL/uL — ABNORMAL LOW (ref 4.22–5.81)

## 2011-05-21 LAB — DIFFERENTIAL
Eosinophils Absolute: 0.1 10*3/uL (ref 0.0–0.7)
Eosinophils Relative: 1 % (ref 0–5)
Lymphocytes Relative: 14 % (ref 12–46)
Lymphs Abs: 1.2 10*3/uL (ref 0.7–4.0)
Monocytes Relative: 7 % (ref 3–12)

## 2011-05-21 MED ORDER — LORAZEPAM 2 MG/ML IJ SOLN
INTRAMUSCULAR | Status: AC
  Filled 2011-05-21: qty 1

## 2011-07-11 LAB — LIPID PANEL
LDL Cholesterol: 168 — ABNORMAL HIGH
Total CHOL/HDL Ratio: 7
VLDL: 7

## 2011-07-11 LAB — PHENYTOIN LEVEL, TOTAL
Phenytoin Lvl: <2.5 — ABNORMAL LOW
Phenytoin Lvl: <2.5 — ABNORMAL LOW

## 2011-07-11 LAB — RAPID URINE DRUG SCREEN, HOSP PERFORMED
Amphetamines: NOT DETECTED
Barbiturates: POSITIVE — AB
Cocaine: NOT DETECTED
Opiates: NOT DETECTED
Tetrahydrocannabinol: NOT DETECTED

## 2011-07-11 LAB — BASIC METABOLIC PANEL
BUN: 7
BUN: 8
CO2: 25
Chloride: 108
Creatinine, Ser: 0.88
GFR calc Af Amer: >60
GFR calc non Af Amer: >60
Potassium: 3.8
Sodium: 132 — ABNORMAL LOW

## 2011-07-11 LAB — POCT CARDIAC MARKERS
CKMB, poc: 10.7
CKMB, poc: 22.5
Myoglobin, poc: 140
Myoglobin, poc: 174
Myoglobin, poc: 362
Operator id: 294341
Troponin i, poc: 2.54

## 2011-07-11 LAB — I-STAT 8, (EC8 V) (CONVERTED LAB)
Acid-Base Excess: 4 — ABNORMAL HIGH
Chloride: 106
Glucose, Bld: 103 — ABNORMAL HIGH
Potassium: 3.7
pCO2, Ven: 40.7 — ABNORMAL LOW
pH, Ven: 7.454 — ABNORMAL HIGH

## 2011-07-11 LAB — CBC
HCT: 34.8 — ABNORMAL LOW
Hemoglobin: 11.5 — ABNORMAL LOW
MCHC: 33.6
MCV: 87.7
Platelets: 502 — ABNORMAL HIGH
Platelets: 525 — ABNORMAL HIGH
Platelets: 528 — ABNORMAL HIGH
RDW: 15
RDW: 15
WBC: 8.2

## 2011-07-11 LAB — POCT I-STAT CREATININE
Creatinine, Ser: 1
Operator id: 198171

## 2011-07-11 LAB — CK TOTAL AND CKMB (NOT AT ARMC)
CK, MB: 7.4 — ABNORMAL HIGH
Relative Index: 3 — ABNORMAL HIGH
Total CK: 298 — ABNORMAL HIGH
Total CK: 375 — ABNORMAL HIGH

## 2011-07-11 LAB — APTT: aPTT: 169 — ABNORMAL HIGH

## 2011-07-11 LAB — HEPARIN LEVEL (UNFRACTIONATED): Heparin Unfractionated: 0.61

## 2011-07-17 LAB — CBC
HCT: 32.1 — ABNORMAL LOW
MCHC: 33.5
MCV: 88.3
Platelets: 444 — ABNORMAL HIGH
RBC: 3.64 — ABNORMAL LOW

## 2011-07-17 LAB — DIFFERENTIAL
Basophils Absolute: 0
Basophils Relative: 0
Neutro Abs: 7.1
Neutrophils Relative %: 78 — ABNORMAL HIGH

## 2011-07-17 LAB — PHENOBARBITAL LEVEL: Phenobarbital: <5 — ABNORMAL LOW

## 2011-07-17 LAB — COMPREHENSIVE METABOLIC PANEL
Alkaline Phosphatase: 52
BUN: 7
Chloride: 108
Glucose, Bld: 97
Potassium: 3.9
Total Bilirubin: 0.5

## 2011-07-18 LAB — POCT I-STAT, CHEM 8
Calcium, Ion: 1.16
HCT: 34 — ABNORMAL LOW
TCO2: 21

## 2011-07-18 LAB — PHENOBARBITAL LEVEL: Phenobarbital: <5 — ABNORMAL LOW

## 2011-07-26 LAB — POCT I-STAT, CHEM 8
BUN: 18 mg/dL (ref 6–23)
Calcium, Ion: 1.13 mmol/L (ref 1.12–1.32)
HCT: 37 % — ABNORMAL LOW (ref 39.0–52.0)
TCO2: 24 mmol/L (ref 0–100)

## 2011-07-26 LAB — PHENOBARBITAL LEVEL: Phenobarbital: <5 ug/mL — ABNORMAL LOW (ref 15.0–40.0)

## 2011-08-02 ENCOUNTER — Emergency Department (HOSPITAL_COMMUNITY): Payer: Self-pay

## 2011-08-02 ENCOUNTER — Emergency Department (HOSPITAL_COMMUNITY)
Admission: EM | Admit: 2011-08-02 | Discharge: 2011-08-02 | Disposition: A | Discharge status: Home / Self Care | Payer: Self-pay | Attending: Emergency Medicine | Admitting: Emergency Medicine

## 2011-08-02 DIAGNOSIS — Z9119 Patient's noncompliance with other medical treatment and regimen: Secondary | ICD-10-CM | POA: Insufficient documentation

## 2011-08-02 DIAGNOSIS — Z79899 Other long term (current) drug therapy: Secondary | ICD-10-CM | POA: Insufficient documentation

## 2011-08-02 DIAGNOSIS — I2510 Atherosclerotic heart disease of native coronary artery without angina pectoris: Secondary | ICD-10-CM | POA: Insufficient documentation

## 2011-08-02 DIAGNOSIS — Z91199 Patient's noncompliance with other medical treatment and regimen due to unspecified reason: Secondary | ICD-10-CM | POA: Insufficient documentation

## 2011-08-02 DIAGNOSIS — G40802 Other epilepsy, not intractable, without status epilepticus: Secondary | ICD-10-CM | POA: Insufficient documentation

## 2011-08-02 LAB — COMPREHENSIVE METABOLIC PANEL
ALT: 9 U/L (ref 0–53)
Calcium: 8.9 mg/dL (ref 8.4–10.5)
Creatinine, Ser: 0.9 mg/dL (ref 0.50–1.35)
GFR calc Af Amer: >90 mL/min (ref >90)
Glucose, Bld: 84 mg/dL (ref 70–99)
Sodium: 139 mEq/L (ref 135–145)
Total Protein: 6.9 g/dL (ref 6.0–8.3)

## 2011-08-03 ENCOUNTER — Emergency Department (HOSPITAL_COMMUNITY): Payer: Self-pay

## 2011-08-03 ENCOUNTER — Emergency Department (HOSPITAL_COMMUNITY)
Admission: EM | Admit: 2011-08-03 | Discharge: 2011-08-04 | Disposition: A | Discharge status: Home / Self Care | Payer: Self-pay | Attending: Emergency Medicine | Admitting: Emergency Medicine

## 2011-08-03 DIAGNOSIS — J029 Acute pharyngitis, unspecified: Secondary | ICD-10-CM | POA: Insufficient documentation

## 2011-08-03 DIAGNOSIS — R6889 Other general symptoms and signs: Secondary | ICD-10-CM | POA: Insufficient documentation

## 2011-08-03 DIAGNOSIS — R569 Unspecified convulsions: Secondary | ICD-10-CM | POA: Insufficient documentation

## 2011-08-23 ENCOUNTER — Telehealth: Payer: Self-pay | Admitting: Cardiovascular Disease

## 2011-08-23 MED ORDER — CLOPIDOGREL BISULFATE 75 MG PO TABS: 75.0000 mg | ORAL_TABLET | Freq: Every day | ORAL | Status: DC

## 2011-08-23 NOTE — Telephone Encounter (Signed)
Called requesting [place] to call to get pt assist for Plavix. Advised that Plavix is now generic and will have to send Rx to his drug store. See that he didn't keep app post hosp w/[facility]. States he was "locked up". Advised will need to call back to make an app w/Dr. Excell Seltzer. Sent Rx for Plavix to [facility].

## 2011-08-23 NOTE — Telephone Encounter (Signed)
Pt  On pt assitance program ,needs to re order plavix, pt [phone]

## 2011-09-08 ENCOUNTER — Inpatient Hospital Stay (HOSPITAL_COMMUNITY)
Admission: EM | Admit: 2011-09-08 | Discharge: 2011-09-09 | DRG: 101 | Discharge status: Against Medical Advice | Payer: Self-pay | Attending: Internal Medicine | Admitting: Internal Medicine

## 2011-09-08 ENCOUNTER — Encounter (HOSPITAL_COMMUNITY): Payer: Self-pay | Admitting: Emergency Medicine

## 2011-09-08 ENCOUNTER — Emergency Department (HOSPITAL_COMMUNITY): Payer: Self-pay

## 2011-09-08 DIAGNOSIS — Z91199 Patient's noncompliance with other medical treatment and regimen due to unspecified reason: Secondary | ICD-10-CM

## 2011-09-08 DIAGNOSIS — E785 Hyperlipidemia, unspecified: Secondary | ICD-10-CM | POA: Diagnosis present

## 2011-09-08 DIAGNOSIS — I2510 Atherosclerotic heart disease of native coronary artery without angina pectoris: Secondary | ICD-10-CM | POA: Diagnosis present

## 2011-09-08 DIAGNOSIS — Z720 Tobacco use: Secondary | ICD-10-CM | POA: Diagnosis present

## 2011-09-08 DIAGNOSIS — Z87891 Personal history of nicotine dependence: Secondary | ICD-10-CM | POA: Diagnosis present

## 2011-09-08 DIAGNOSIS — I252 Old myocardial infarction: Secondary | ICD-10-CM

## 2011-09-08 DIAGNOSIS — Z9119 Patient's noncompliance with other medical treatment and regimen: Secondary | ICD-10-CM

## 2011-09-08 DIAGNOSIS — F17200 Nicotine dependence, unspecified, uncomplicated: Secondary | ICD-10-CM | POA: Diagnosis present

## 2011-09-08 DIAGNOSIS — G40802 Other epilepsy, not intractable, without status epilepticus: Principal | ICD-10-CM | POA: Diagnosis present

## 2011-09-08 DIAGNOSIS — Z7982 Long term (current) use of aspirin: Secondary | ICD-10-CM

## 2011-09-08 DIAGNOSIS — R569 Unspecified convulsions: Secondary | ICD-10-CM | POA: Diagnosis present

## 2011-09-08 HISTORY — DX: Essential (primary) hypertension: I10

## 2011-09-08 HISTORY — DX: Unspecified convulsions: R56.9

## 2011-09-08 LAB — CBC
Hemoglobin: 10.7 g/dL — ABNORMAL LOW (ref 13.0–17.0)
MCH: 29.8 pg (ref 26.0–34.0)
RBC: 3.59 MIL/uL — ABNORMAL LOW (ref 4.22–5.81)
WBC: 7.3 10*3/uL (ref 4.0–10.5)

## 2011-09-08 LAB — DIFFERENTIAL
Lymphocytes Relative: 12 % (ref 12–46)
Lymphs Abs: 0.9 10*3/uL (ref 0.7–4.0)
Monocytes Relative: 17 % — ABNORMAL HIGH (ref 3–12)
Neutro Abs: 5 10*3/uL (ref 1.7–7.7)
Neutrophils Relative %: 69 % (ref 43–77)

## 2011-09-08 LAB — POCT I-STAT, CHEM 8
Calcium, Ion: 1.09 mmol/L — ABNORMAL LOW (ref 1.12–1.32)
Hemoglobin: 11.6 g/dL — ABNORMAL LOW (ref 13.0–17.0)
Sodium: 141 mEq/L (ref 135–145)
TCO2: 21 mmol/L (ref 0–100)

## 2011-09-08 LAB — COMPREHENSIVE METABOLIC PANEL
ALT: 6 U/L (ref 0–53)
Alkaline Phosphatase: 75 U/L (ref 39–117)
BUN: 9 mg/dL (ref 6–23)
CO2: 23 mEq/L (ref 19–32)
Chloride: 101 mEq/L (ref 96–112)
GFR calc Af Amer: >90 mL/min (ref >90)
Glucose, Bld: 92 mg/dL (ref 70–99)
Potassium: 3.5 mEq/L (ref 3.5–5.1)
Sodium: 138 mEq/L (ref 135–145)
Total Bilirubin: 0.2 mg/dL — ABNORMAL LOW (ref 0.3–1.2)

## 2011-09-08 MED ORDER — PHENOBARBITAL 100 MG PO TABS
200.0000 mg | ORAL_TABLET | Freq: Two times a day (BID) | ORAL | Status: DC
  Administered 2011-09-08: 200 mg via ORAL

## 2011-09-08 MED ORDER — LORAZEPAM 2 MG/ML IJ SOLN
INTRAMUSCULAR | Status: AC
  Administered 2011-09-08: 2 mg via INTRAVENOUS
  Filled 2011-09-08: qty 1

## 2011-09-08 MED ORDER — PHENOBARBITAL 100 MG PO TABS
200.0000 mg | ORAL_TABLET | ORAL | Status: DC
  Filled 2011-09-08: qty 2

## 2011-09-08 MED ORDER — LORAZEPAM 2 MG/ML IJ SOLN
2.0000 mg | Freq: Once | INTRAMUSCULAR | Status: AC
  Administered 2011-09-08: 2 mg via INTRAVENOUS

## 2011-09-08 MED ORDER — SODIUM CHLORIDE 0.9 % IV SOLN
1000.0000 mg | Freq: Once | INTRAVENOUS | Status: AC
  Administered 2011-09-08: 1000 mg via INTRAVENOUS
  Filled 2011-09-08: qty 20

## 2011-09-08 NOTE — ED Notes (Signed)
Pt waking up, responsive. There is no more noted seizure activity at this time. Parmacy called and asked to expidite dilantin. V/s obtained and are observed as being WNL.

## 2011-09-08 NOTE — ED Notes (Signed)
Pt has seizure hx. Pt had 2 seizures pta of ems. Pt post dictal upon ems arrival. Pt now alert and oriented x3. Pt moving all extremities. No neuro deficits.

## 2011-09-08 NOTE — ED Provider Notes (Addendum)
History     CSN: [id number] Arrival date & time: 09/08/2011  8:06 PM   First MD Initiated Contact with Patient 09/08/11 2025      Chief Complaint  Patient presents with  . Seizures    pt states had 2 today. witnessed by family. pt has seizure hx. pt states takes dilantin     (Consider location/radiation/quality/duration/timing/severity/associated sxs/prior treatment) HPI.... level V caveat for post ictal state.  Patient has known seizure disorder. Has not been taking his phenobarbital today. Has had 3 tonic-clonic seizures in the past 24 hours. Has long-standing seizure disorder. 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 Has not been ill lately. Nothing makes symptoms better or worse.  Past Medical History  Diagnosis Date  . CAD (coronary artery disease)   . HLD (hyperlipidemia)   . Seizure disorder     Past Surgical History  Procedure Date  . Carotid stent jan. 2009, apr. 2010    proximal left circumflex artery    History reviewed. No pertinent family history.  History  Substance Use Topics  . Smoking status: Smoker, Current Status Unknown  . Smokeless tobacco: Not on file  . Alcohol Use: Yes     occasionally      Review of Systems  Unable to perform ROS: Other    Allergies  Review of patient's allergies indicates no known allergies.  Home Medications   Current Outpatient Rx  Name Route Sig Dispense Refill  . ASPIRIN 81 MG PO TABS Oral Take 81 mg by mouth daily.      [name] CLOPIDOGREL BISULFATE 75 MG PO TABS Oral Take 1 tablet (75 mg total) by mouth daily. 30 tablet 3  . PHENOBARBITAL 100 MG PO TABS Oral Take 100 mg by mouth daily.       BP 110/74  Pulse 104  Temp(Src) 98.4 F (36.9 C) (Oral)  Resp 17  SpO2 99%  Physical Exam  Nursing note and vitals reviewed. Constitutional: He is oriented to person, place, and time. He appears well-developed and well-nourished.  HENT:  Head: Normocephalic and atraumatic.  Eyes: Conjunctivae and EOM are normal. Pupils  are equal, round, and reactive to light.  Neck: Normal range of motion. Neck supple.  Cardiovascular: Normal rate and regular rhythm.   Pulmonary/Chest: Effort normal and breath sounds normal.  Abdominal: Soft. Bowel sounds are normal.  Musculoskeletal: Normal range of motion.  Neurological: He is alert and oriented to person, place, and time.  Skin: Skin is warm and dry.  Psychiatric: He has a normal mood and affect.    ED Course  Procedures (including critical care time)  Labs Reviewed  PHENOBARBITAL LEVEL - Abnormal; Notable for the following:    Phenobarbital 5.3 (*)    All other components within normal limits  I-STAT, CHEM 8   No results found.   No diagnosis found.  Patient had tonic clonic seizure approximately 1930. Ativan 2 mg IV given along with Dilantin 1 g IV load.  Will admit patient. Discuss with neurology . CRITICAL CARE Performed by: Donnetta Hutching   Total critical care time: 30  Critical care time was exclusive of separately billable procedures and treating other patients.  Critical care was necessary to treat or prevent imminent or life-threatening deterioration.  Critical care was time spent personally by me on the following activities: development of treatment plan with patient and/or surrogate as well as nursing, discussions with consultants, evaluation of patient's response to treatment, examination of patient, obtaining history from patient or surrogate, ordering  and performing treatments and interventions, ordering and review of laboratory studies, ordering and review of radiographic studies, pulse oximetry and re-evaluation of patient's condition. MDM  Patient has had 3 seizures in past 24 hours. Also an additional seizure in the emergency department. Will give benzodiazepine IV along with Dilantin IV. Admit.        Donnetta Hutching, MD 09/08/11 1308  Donnetta Hutching, MD 09/09/11 [phone]

## 2011-09-08 NOTE — ED Notes (Signed)
Patient is resting comfortably. 

## 2011-09-08 NOTE — ED Notes (Signed)
Vital signs stable. 

## 2011-09-08 NOTE — ED Notes (Signed)
Family at bedside. 

## 2011-09-09 ENCOUNTER — Encounter (HOSPITAL_COMMUNITY): Payer: Self-pay | Admitting: Internal Medicine

## 2011-09-09 ENCOUNTER — Emergency Department (HOSPITAL_COMMUNITY): Payer: Self-pay

## 2011-09-09 ENCOUNTER — Encounter (HOSPITAL_COMMUNITY): Payer: Self-pay | Admitting: *Deleted

## 2011-09-09 LAB — CBC
HCT: 31.5 % — ABNORMAL LOW (ref 39.0–52.0)
Hemoglobin: 10.6 g/dL — ABNORMAL LOW (ref 13.0–17.0)
MCV: 89.5 fL (ref 78.0–100.0)
RBC: 3.52 MIL/uL — ABNORMAL LOW (ref 4.22–5.81)
RDW: 15.7 % — ABNORMAL HIGH (ref 11.5–15.5)
WBC: 5.7 10*3/uL (ref 4.0–10.5)

## 2011-09-09 LAB — RAPID URINE DRUG SCREEN, HOSP PERFORMED
Amphetamines: NOT DETECTED
Barbiturates: POSITIVE — AB
Benzodiazepines: NOT DETECTED
Cocaine: NOT DETECTED
Opiates: NOT DETECTED
Tetrahydrocannabinol: NOT DETECTED

## 2011-09-09 LAB — ETHANOL: Alcohol, Ethyl (B): <11 mg/dL (ref 0–11)

## 2011-09-09 LAB — COMPREHENSIVE METABOLIC PANEL
Albumin: 3.2 g/dL — ABNORMAL LOW (ref 3.5–5.2)
Alkaline Phosphatase: 74 U/L (ref 39–117)
BUN: 12 mg/dL (ref 6–23)
Calcium: 8.7 mg/dL (ref 8.4–10.5)
Potassium: 3.7 mEq/L (ref 3.5–5.1)
Sodium: 141 mEq/L (ref 135–145)
Total Protein: 6.9 g/dL (ref 6.0–8.3)

## 2011-09-09 LAB — PHENYTOIN LEVEL, TOTAL: Phenytoin Lvl: 11 ug/mL (ref 10.0–20.0)

## 2011-09-09 MED ORDER — PHENOBARBITAL 100 MG PO TABS
100.0000 mg | ORAL_TABLET | Freq: Every day | ORAL | Status: DC
Start: 2011-09-09 — End: 2011-09-09
  Administered 2011-09-09: 100 mg via ORAL
  Filled 2011-09-09: qty 1

## 2011-09-09 MED ORDER — ACETAMINOPHEN 650 MG RE SUPP: 650.0000 mg | Freq: Four times a day (QID) | RECTAL | Status: DC | PRN

## 2011-09-09 MED ORDER — ASPIRIN 81 MG PO CHEW
81.0000 mg | CHEWABLE_TABLET | Freq: Every day | ORAL | Status: DC
  Administered 2011-09-09: 81 mg via ORAL
  Filled 2011-09-09: qty 1

## 2011-09-09 MED ORDER — SODIUM CHLORIDE 0.9 % IV SOLN
INTRAVENOUS | Status: DC
  Administered 2011-09-09: 05:00:00 via INTRAVENOUS

## 2011-09-09 MED ORDER — ONDANSETRON HCL 4 MG PO TABS: 4.0000 mg | ORAL_TABLET | Freq: Four times a day (QID) | ORAL | Status: DC | PRN

## 2011-09-09 MED ORDER — LORAZEPAM 2 MG/ML IJ SOLN: 1.0000 mg | INTRAMUSCULAR | Status: DC | PRN

## 2011-09-09 MED ORDER — CLOPIDOGREL BISULFATE 75 MG PO TABS
75.0000 mg | ORAL_TABLET | Freq: Every day | ORAL | Status: DC
  Administered 2011-09-09: 75 mg via ORAL
  Filled 2011-09-09 (×2): qty 1

## 2011-09-09 MED ORDER — ONDANSETRON HCL 4 MG/2ML IJ SOLN: 4.0000 mg | Freq: Four times a day (QID) | INTRAMUSCULAR | Status: DC | PRN

## 2011-09-09 MED ORDER — PHENYTOIN SODIUM EXTENDED 100 MG PO CAPS
100.0000 mg | ORAL_CAPSULE | Freq: Three times a day (TID) | ORAL | Status: DC
  Administered 2011-09-09: 100 mg via ORAL
  Filled 2011-09-09 (×3): qty 1

## 2011-09-09 MED ORDER — ACETAMINOPHEN 325 MG PO TABS: 650.0000 mg | ORAL_TABLET | Freq: Four times a day (QID) | ORAL | Status: DC | PRN

## 2011-09-09 NOTE — Plan of Care (Signed)
Problem: Consults Goal: General Medical Patient Education See Patient Education Module for specific education. Outcome: Progressing Pt admitted for seizures

## 2011-09-09 NOTE — Consult Note (Signed)
Pt smokes 3 Black & Mild cigars per day and refuses counseling. Pt in pre-contemplation stage. Refused education info.

## 2011-09-09 NOTE — H&P (Signed)
[name] is an 50 y.o. male.   Chief Complaint: Seizure HPI: 68 yea rold male with known history of CAD S/P stent H/O seizure had ateast three generalzed tonic clonic seizure episodes in his home as witnessed by his mother in a twenty four hour period. He was brought to ER where in he had another seizure. He was given ativan 2mg  iv and dilantin loading dose 1 gm. Later it was foungd patient is on phenobarbital and his levels were sub therapeutic so a loading dose of 200 mg was given orally. Patient states he has not taken his medication for one day.  He had some neck pain x ray c spine does not show anything acute.  Past Medical History  Diagnosis Date  . CAD (coronary artery disease)   . HLD (hyperlipidemia)   . Seizure disorder     Past Surgical History  Procedure Date  . Carotid stent jan. 2009, apr. 2010    proximal left circumflex artery  . Cardiac catheterization     History reviewed. No pertinent family history. Social History:  reports that he has been smoking.  He does not have any smokeless tobacco history on file. He reports that he drinks alcohol. He reports that he does not use illicit drugs.  Allergies: No Known Allergies  Medications Prior to Admission  Medication Dose Route Frequency Provider Last Rate Last Dose  . LORazepam (ATIVAN) injection 2 mg  2 mg Intravenous Once Donnetta Hutching, MD   2 mg at 09/08/11 2128  . PHENObarbital (LUMINAL) tablet 200 mg  200 mg Oral BID Donnetta Hutching, MD   200 mg at 09/08/11 2341  . PHENObarbital (LUMINAL) tablet 200 mg  200 mg Oral To Major Meera Fernand Parkins, PHARMD      . phenytoin (DILANTIN) 1,000 mg in sodium chloride 0.9 % 250 mL IVPB  1,000 mg Intravenous Once Donnetta Hutching, MD   1,000 mg at 09/08/11 2204   Medications Prior to Admission  Medication Sig Dispense Refill  . aspirin 81 MG tablet Take 81 mg by mouth daily.        . clopidogrel (PLAVIX) 75 MG tablet Take 1 tablet (75 mg total) by mouth daily.  30 tablet  3  .  PHENObarbital (LUMINAL) 100 MG tablet Take 100 mg by mouth daily.         Results for orders placed during the hospital encounter of 09/08/11 (from the past 48 hour(s))  PHENOBARBITAL LEVEL     Status: Abnormal   Collection Time   09/08/11  9:06 PM      Component Value Range Comment   Phenobarbital 5.3 (*) 15.0 - 40.0 (ug/mL)   CBC     Status: Abnormal   Collection Time   09/08/11 11:14 PM      Component Value Range Comment   WBC 7.3  4.0 - 10.5 (K/uL)    RBC 3.59 (*) 4.22 - 5.81 (MIL/uL)    Hemoglobin 10.7 (*) 13.0 - 17.0 (g/dL)    HCT 40.9 (*) 81.1 - 52.0 (%)    MCV 88.3  78.0 - 100.0 (fL)    MCH 29.8  26.0 - 34.0 (pg)    MCHC 33.8  30.0 - 36.0 (g/dL)    RDW 91.4 (*) 78.2 - 15.5 (%)    Platelets 295  150 - 400 (K/uL)   DIFFERENTIAL     Status: Abnormal   Collection Time   09/08/11 11:14 PM      Component Value Range Comment  Neutrophils Relative 69  43 - 77 (%)    Neutro Abs 5.0  1.7 - 7.7 (K/uL)    Lymphocytes Relative 12  12 - 46 (%)    Lymphs Abs 0.9  0.7 - 4.0 (K/uL)    Monocytes Relative 17 (*) 3 - 12 (%)    Monocytes Absolute 1.3 (*) 0.1 - 1.0 (K/uL)    Eosinophils Relative 2  0 - 5 (%)    Eosinophils Absolute 0.1  0.0 - 0.7 (K/uL)    Basophils Relative 0  0 - 1 (%)    Basophils Absolute 0.0  0.0 - 0.1 (K/uL)   COMPREHENSIVE METABOLIC PANEL     Status: Abnormal   Collection Time   09/08/11 11:14 PM      Component Value Range Comment   Sodium 138  135 - 145 (mEq/L)    Potassium 3.5  3.5 - 5.1 (mEq/L)    Chloride 101  96 - 112 (mEq/L)    CO2 23  19 - 32 (mEq/L)    Glucose, Bld 92  70 - 99 (mg/dL)    BUN 9  6 - 23 (mg/dL)    Creatinine, Ser 9.14  0.50 - 1.35 (mg/dL)    Calcium 9.1  8.4 - 10.5 (mg/dL)    Total Protein 7.1  6.0 - 8.3 (g/dL)    Albumin 3.7  3.5 - 5.2 (g/dL)    AST 13  0 - 37 (U/L)    ALT 6  0 - 53 (U/L)    Alkaline Phosphatase 75  39 - 117 (U/L)    Total Bilirubin 0.2 (*) 0.3 - 1.2 (mg/dL)    GFR calc non Af Amer 81 (*) >90 (mL/min)    GFR  calc Af Amer >90  >90 (mL/min)   POCT I-STAT, CHEM 8     Status: Abnormal   Collection Time   09/08/11 11:19 PM      Component Value Range Comment   Sodium 141  135 - 145 (mEq/L)    Potassium 3.5  3.5 - 5.1 (mEq/L)    Chloride 106  96 - 112 (mEq/L)    BUN 8  6 - 23 (mg/dL)    Creatinine, Ser 7.82  0.50 - 1.35 (mg/dL)    Glucose, Bld 98  70 - 99 (mg/dL)    Calcium, Ion 9.56 (*) 1.12 - 1.32 (mmol/L)    TCO2 21  0 - 100 (mmol/L)    Hemoglobin 11.6 (*) 13.0 - 17.0 (g/dL)    HCT 21.3 (*) 08.6 - 52.0 (%)   ETHANOL     Status: Normal   Collection Time   09/09/11 12:05 AM      Component Value Range Comment   Alcohol, Ethyl (B) <11  0 - 11 (mg/dL)    Dg Cervical Spine Complete  09/08/2011  *RADIOLOGY REPORT*  Clinical Data: Seizures x 4 in the past 24 hours.  Neck pain.  CERVICAL SPINE - COMPLETE 4+ VIEW 09/08/2011:  Comparison: Soft tissue neck x-rays 08/03/2011.  CT cervical spine 03/01/2011.  Findings: Lateral images were obtained using cross-table technique. Straightening of the usual lordosis.  Anatomic posterior alignment. No visible fractures.  Normal prevertebral soft tissues.  Large bridging anterior osteophytes at C4-5 and C5-6.  Disc spaces well preserved.  Facet joints intact.  As the oblique images were obtained supine, it is difficult to comment on the neural foramina. No static evidence of instability.  No significant change since the prior CT.  IMPRESSION: Straightening of the usual  lordosis which may reflect positioning and/or spasm.  No evidence of fracture or static signs of instability.  Large bridging osteophytes anteriorly at C4-5 and C5- 6.  Original Report Authenticated By: Arnell Sieving, M.D.    Review of Systems  Constitutional: Negative.   HENT: Positive for neck pain.   Eyes: Negative.   Respiratory: Negative.   Cardiovascular: Negative.   Gastrointestinal: Negative.   Genitourinary: Negative.   Skin: Negative.   Neurological: Positive for seizures.    Endo/Heme/Allergies: Negative.   Psychiatric/Behavioral: Negative.     Blood pressure 93/59, pulse 82, temperature 98.4 F (36.9 C), temperature source Oral, resp. rate 13, SpO2 100.00%. Physical Exam  Constitutional: He is oriented to person, place, and time. He appears well-developed and well-nourished.  HENT:  Head: Normocephalic and atraumatic.  Right Ear: External ear normal.  Left Ear: External ear normal.  Nose: Nose normal.  Mouth/Throat: Oropharynx is clear and moist.  Eyes: Conjunctivae and EOM are normal. Pupils are equal, round, and reactive to light.  Neck: Normal range of motion. Neck supple.  Cardiovascular: Normal rate, regular rhythm, normal heart sounds and intact distal pulses.   Respiratory: Effort normal and breath sounds normal.  GI: Soft. Bowel sounds are normal.  Musculoskeletal: Normal range of motion.  Neurological: He is alert and oriented to person, place, and time. He has normal reflexes.       Moves upper and lower extremity 5/5  Skin: Skin is warm and dry.  Psychiatric: His behavior is normal.     Assessment/Plan 1)Break thru seizure 2)H/O CAD S/P stent 3)Ongoing tobacco abuse 4)Poor social condition  Plan:  Admit to telemetry Patient has already received loading doses of dilantin and phenabarbital and dilantin. Will check levels in am. Will now continue with home dose of phenobarbital and dilantin. We need confirm in am his regular home doses. CT head and urine drug screen are still pending which has to be followed Will need social worker consult as patient has difficulty paying for his medications.  Calil Amor N. 09/09/2011, 2:12 AM

## 2011-09-09 NOTE — Progress Notes (Signed)
Utilization review complete 

## 2011-10-09 NOTE — Discharge Summary (Signed)
DISCHARGE SUMMARY  [name]  MR#: [id number]  DOB:November 05, 1960  Date of Admission: 09/08/2011 Date of Discharge: 10/09/2011  Attending Physician:Tiyana Galla I.  Patient's PCP:No primary provider on file.  Consults:NONE Discharge Diagnoses: 1- seizure with break through 2-chest pain refused further investigation and signed against medical advise 3- ongoing tobacco abuse 4-CAD (coronary artery disease sp stent 5- hyperlipidemia Procedure: ct head without contrast No acute intracranial abnormality.  Partially opacified left mastoid air cells again noted. Correlate  clinically if concern for mastoiditis. Disposition: patient signed against medical advise  Discharge Medication List as of 09/09/2011  4:43 PM    CONTINUE these medications which have NOT CHANGED   Details  aspirin 81 MG tablet Take 81 mg by mouth daily.  , Until Discontinued, Historical Med    clopidogrel (PLAVIX) 75 MG tablet Take 1 tablet (75 mg total) by mouth daily., Starting 08/23/2011, Until Discontinued, Normal    lovastatin (MEVACOR) 20 MG tablet Take 20 mg by mouth daily.  , Until Discontinued, Historical Med    PHENObarbital (LUMINAL) 100 MG tablet Take 100 mg by mouth daily. , Until Discontinued, Historical Med          Hospital Course:50 yea rold male with known history of CAD S/P stent H/O seizure had ateast three generalzed tonic clonic seizure episodes in his home as witnessed by his mother in a twenty four hour period. He was brought to ER where in he had another seizure. He was given ativan 2mg  iv and dilantin loading dose 1 gm. Later it was foungd patient is on phenobarbital and his levels were sub therapeutic so a loading dose of 200 mg was given orally. Patient states he has not taken his medication for one day. He had some neck pain x ray c spine does not show anything acute.idid see and examined the patient, patient admitted he did not take his seizure medications for 4 days secondary to run  out of medications, patient also complains of left side chest pain for the last 2 weeks , patient insisted on discharge ,I did DW the patient important oF evaluate his chest pain but he refused to stay, patient given a prescription for his phenobarb,pt signed against medical advise    Day of Discharge BP 95/59  Pulse 75  Temp(Src) 98.1 F (36.7 C) (Oral)  Resp 16  Ht 5\' 10"  (1.778 m)  Wt 73.5 kg (162 lb 0.6 oz)  BMI 23.25 kg/m2  SpO2 98%   poor dental hygiene, dishelves Neck with no LN Heart s1 and s2 ,no added sounds abomen soft non tender BS positive  extremities with no edema  CNS  Awake and fully oriented no focal deficit No results found for this or any previous visit (from the past 24 hour(s)).  Disposition:  home   Follow-up Appts:   Follow-up with MD ONE WEEK AND    Tests Needing Follow-up: CHECK PHENOBARB LEVEL  Signed: Teja Costen I. 10/09/2011, 4:05 PM

## 2011-10-25 ENCOUNTER — Emergency Department (HOSPITAL_COMMUNITY): Payer: Self-pay

## 2011-10-25 ENCOUNTER — Other Ambulatory Visit: Payer: Self-pay

## 2011-10-25 ENCOUNTER — Encounter (HOSPITAL_COMMUNITY): Payer: Self-pay | Admitting: Emergency Medicine

## 2011-10-25 ENCOUNTER — Emergency Department (HOSPITAL_COMMUNITY)
Admission: EM | Admit: 2011-10-25 | Discharge: 2011-10-25 | Payer: Self-pay | Attending: Emergency Medicine | Admitting: Emergency Medicine

## 2011-10-25 DIAGNOSIS — R569 Unspecified convulsions: Secondary | ICD-10-CM

## 2011-10-25 DIAGNOSIS — I2510 Atherosclerotic heart disease of native coronary artery without angina pectoris: Secondary | ICD-10-CM | POA: Insufficient documentation

## 2011-10-25 DIAGNOSIS — R51 Headache: Secondary | ICD-10-CM | POA: Insufficient documentation

## 2011-10-25 DIAGNOSIS — I252 Old myocardial infarction: Secondary | ICD-10-CM | POA: Insufficient documentation

## 2011-10-25 DIAGNOSIS — F17200 Nicotine dependence, unspecified, uncomplicated: Secondary | ICD-10-CM | POA: Insufficient documentation

## 2011-10-25 DIAGNOSIS — Z7982 Long term (current) use of aspirin: Secondary | ICD-10-CM | POA: Insufficient documentation

## 2011-10-25 DIAGNOSIS — Z79899 Other long term (current) drug therapy: Secondary | ICD-10-CM | POA: Insufficient documentation

## 2011-10-25 DIAGNOSIS — Z9889 Other specified postprocedural states: Secondary | ICD-10-CM | POA: Insufficient documentation

## 2011-10-25 DIAGNOSIS — I10 Essential (primary) hypertension: Secondary | ICD-10-CM | POA: Insufficient documentation

## 2011-10-25 DIAGNOSIS — G40909 Epilepsy, unspecified, not intractable, without status epilepticus: Secondary | ICD-10-CM | POA: Insufficient documentation

## 2011-10-25 LAB — URINALYSIS, ROUTINE W REFLEX MICROSCOPIC
Bilirubin Urine: NEGATIVE
Glucose, UA: NEGATIVE mg/dL
Ketones, ur: NEGATIVE mg/dL
pH: 6 (ref 5.0–8.0)

## 2011-10-25 LAB — RAPID URINE DRUG SCREEN, HOSP PERFORMED
Amphetamines: NOT DETECTED
Barbiturates: POSITIVE — AB
Benzodiazepines: NOT DETECTED
Tetrahydrocannabinol: NOT DETECTED

## 2011-10-25 LAB — BASIC METABOLIC PANEL
CO2: 25 mEq/L (ref 19–32)
Calcium: 9.3 mg/dL (ref 8.4–10.5)
Creatinine, Ser: 1.04 mg/dL (ref 0.50–1.35)
GFR calc non Af Amer: 83 mL/min — ABNORMAL LOW (ref >90)
Glucose, Bld: 95 mg/dL (ref 70–99)

## 2011-10-25 LAB — URINE MICROSCOPIC-ADD ON

## 2011-10-25 MED ORDER — PHENOBARBITAL 60 MG PO TABS: 60.0000 mg | ORAL_TABLET | Freq: Two times a day (BID) | ORAL | Status: DC

## 2011-10-25 MED ORDER — PHENOBARBITAL 32.4 MG PO TABS
64.8000 mg | ORAL_TABLET | Freq: Two times a day (BID) | ORAL | Status: DC
  Administered 2011-10-25: 64.8 mg via ORAL
  Filled 2011-10-25: qty 2

## 2011-10-25 MED ORDER — PHENOBARBITAL 60 MG PO TABS: 120.0000 mg | ORAL_TABLET | Freq: Every day | ORAL | Status: DC

## 2011-10-25 NOTE — ED Provider Notes (Signed)
History     CSN: [id number]  Arrival date & time 10/25/11  0807   First MD Initiated Contact with Patient 10/25/11 0818      No chief complaint on file.   (Consider location/radiation/quality/duration/timing/severity/associated sxs/prior treatment) HPI Comments: Patient presents via EMS having a witnessed seizure at home. Mother states he had clonic tonic seizure for about 5 minutes this morning.  He does have a history of seizures and takes phenobarbital. States compliance with the phenobarbital. There is no tongue biting or incontinence. Patient is initially postictal EMS arrival but is now awake alert. He denies any complaints and is frustrated that he is here.  He has not seen his neurologist Dr. love in 4 years because he can't afford it. He was admitted in November 18 with breakthrough seizures and found to be noncompliant with phenobarbital.  The history is provided by the patient and a relative.    Past Medical History  Diagnosis Date  . CAD (coronary artery disease)   . HLD (hyperlipidemia)   . Seizure disorder   . Angina   . Seizures   . Hypertension   . Myocardial infarction     Past Surgical History  Procedure Date  . Carotid stent jan. 2009, apr. 2010    proximal left circumflex artery  . Cardiac catheterization     History reviewed. No pertinent family history.  History  Substance Use Topics  . Smoking status: Smoker, Current Status Unknown -- 1.0 packs/day for 35 years    Types: Cigarettes  . Smokeless tobacco: Not on file  . Alcohol Use: 0.6 oz/week    1 Cans of beer per week     occasionally      Review of Systems  Constitutional: Negative for fever, activity change and appetite change.  HENT: Negative for rhinorrhea.   Respiratory: Negative for cough, chest tightness and shortness of breath.   Cardiovascular: Negative for chest pain.  Gastrointestinal: Negative for nausea, vomiting and abdominal pain.  Genitourinary: Negative for dysuria.    Musculoskeletal: Negative for back pain.  Skin: Negative for rash.  Neurological: Positive for seizures. Negative for headaches.    Allergies  Food allergy formula  Home Medications   Current Outpatient Rx  Name Route Sig Dispense Refill  . ASPIRIN 81 MG PO TABS Oral Take 81 mg by mouth daily.      [name] CLOPIDOGREL BISULFATE 75 MG PO TABS Oral Take 1 tablet (75 mg total) by mouth daily. 30 tablet 3  . PHENOBARBITAL 100 MG PO TABS Oral Take 100 mg by mouth daily.     [name] LOVASTATIN 20 MG PO TABS Oral Take 20 mg by mouth daily.      [name] NITROGLYCERIN 0.4 MG SL SUBL Sublingual Place 0.4 mg under the tongue every 5 (five) minutes as needed.      [name] PHENOBARBITAL 60 MG PO TABS Oral Take 2 tablets (120 mg total) by mouth daily. 60 tablet 0    BP 120/77  Pulse 67  Temp(Src) 97.7 F (36.5 C) (Oral)  Resp 18  SpO2 100%  Physical Exam  Constitutional: He is oriented to person, place, and time. He appears well-developed and well-nourished. No distress.  HENT:  Head: Normocephalic and atraumatic.  Mouth/Throat: Oropharynx is clear and moist. No oropharyngeal exudate.  Eyes: Conjunctivae and EOM are normal. Pupils are equal, round, and reactive to light.  Neck: Normal range of motion. Neck supple.       No meningismus  Cardiovascular: Normal rate, regular rhythm  and normal heart sounds.   Pulmonary/Chest: Effort normal and breath sounds normal. No respiratory distress.  Abdominal: Soft. There is no tenderness. There is no rebound and no guarding.  Musculoskeletal: Normal range of motion. He exhibits no edema and no tenderness.  Neurological: He is alert and oriented to person, place, and time. No cranial nerve deficit.       Cranial nerves 2-12 intact, 5 of 5 strength throughout, no ataxia  Skin: Skin is warm.    ED Course  Procedures (including critical care time)  Labs Reviewed  BASIC METABOLIC PANEL - Abnormal; Notable for the following:    Sodium 134 (*)    GFR calc non Af Amer 83  (*)    All other components within normal limits  PHENOBARBITAL LEVEL - Abnormal; Notable for the following:    Phenobarbital 11.3 (*)    All other components within normal limits  URINALYSIS, ROUTINE W REFLEX MICROSCOPIC - Abnormal; Notable for the following:    Hgb urine dipstick TRACE (*)    All other components within normal limits  URINE RAPID DRUG SCREEN (HOSP PERFORMED) - Abnormal; Notable for the following:    Barbiturates POSITIVE (*)    All other components within normal limits  URINE MICROSCOPIC-ADD ON - Abnormal; Notable for the following:    Bacteria, UA FEW (*)    All other components within normal limits   Ct Head Wo Contrast  10/25/2011  *RADIOLOGY REPORT*  Clinical Data: Recent seizure.  History of seizure disorder. Headaches.  CT HEAD WITHOUT CONTRAST  Technique:  Contiguous axial images were obtained from the base of the skull through the vertex without contrast.  Comparison: 09/09/2011 is the most recent.  There are multiple other CT scans as far back as 2009.  Findings: Premature cerebellar atrophy is present for the patient's age of 79.  There is no definite cerebral cortical atrophy.  No definite white matter disease is seen.  I see no acute stroke, intracranial hemorrhage, mass lesion, hydrocephalus, or extra-axial fluid.  There is advanced vascular calcification in the carotid siphon regions and basilar artery. The calvarium is intact.  Left mastoid fluid remains of uncertain significance.  Mastoiditis is not excluded.  There may be some coalescence.  No middle ear fluid is seen.  Incompletely visualized sinuses are clear.  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  No acute intracranial findings.  Left mastoid fluid appears chronic; mastoiditis not excluded.  Original Report Authenticated By: Elsie Stain, M.D.     1. Seizure       MDM  Seizure with known seizure disorder. At neurological baseline, without complaint.  States compliance with phenobarbital.  We'll check labs lites, phenobarbital level   Date: 10/25/2011  Rate: 78  Rhythm: normal sinus rhythm  QRS Axis: normal  Intervals: normal  ST/T Wave abnormalities: nonspecific ST/T changes  Conduction Disutrbances:none  Narrative Interpretation:   Old EKG Reviewed: unchanged This is stable and the EMS had no further seizures. Awake and alert and his neurological baseline  Phenobarbital level subtherapeutic 11.3. Discussed with neurology Dr. Lyman Speller.  He recommends dosing 60 mg of phenobarbital now and increasing patient's daily dose to 120. He should followup with Dr. love next week.      Glynn Octave, MD 10/25/11 1451

## 2011-10-25 NOTE — ED Notes (Signed)
Patient transported to CT 

## 2011-10-25 NOTE — ED Notes (Signed)
ZOX:[id number]<AV> Expected date:10/25/11<BR> Expected time: 7:43 AM<BR> Means of arrival:Ambulance<BR> Comments:<BR> seizures

## 2011-12-08 ENCOUNTER — Other Ambulatory Visit: Payer: Self-pay

## 2011-12-08 ENCOUNTER — Emergency Department (HOSPITAL_COMMUNITY)
Admission: EM | Admit: 2011-12-08 | Discharge: 2011-12-08 | Disposition: A | Discharge status: Home / Self Care | Payer: Self-pay | Attending: Emergency Medicine | Admitting: Emergency Medicine

## 2011-12-08 ENCOUNTER — Encounter (HOSPITAL_COMMUNITY): Payer: Self-pay | Admitting: *Deleted

## 2011-12-08 DIAGNOSIS — I252 Old myocardial infarction: Secondary | ICD-10-CM | POA: Insufficient documentation

## 2011-12-08 DIAGNOSIS — G40909 Epilepsy, unspecified, not intractable, without status epilepticus: Secondary | ICD-10-CM | POA: Insufficient documentation

## 2011-12-08 DIAGNOSIS — I2510 Atherosclerotic heart disease of native coronary artery without angina pectoris: Secondary | ICD-10-CM | POA: Insufficient documentation

## 2011-12-08 DIAGNOSIS — I10 Essential (primary) hypertension: Secondary | ICD-10-CM | POA: Insufficient documentation

## 2011-12-08 DIAGNOSIS — R569 Unspecified convulsions: Secondary | ICD-10-CM

## 2011-12-08 DIAGNOSIS — Z79899 Other long term (current) drug therapy: Secondary | ICD-10-CM | POA: Insufficient documentation

## 2011-12-08 LAB — DIFFERENTIAL
Basophils Absolute: 0 10*3/uL (ref 0.0–0.1)
Lymphs Abs: 3.7 10*3/uL (ref 0.7–4.0)
Monocytes Relative: 9 % (ref 3–12)
Neutro Abs: 7.6 10*3/uL (ref 1.7–7.7)

## 2011-12-08 LAB — CBC
HCT: 38.4 % — ABNORMAL LOW (ref 39.0–52.0)
MCH: 29.9 pg (ref 26.0–34.0)
MCHC: 32.8 g/dL (ref 30.0–36.0)
MCV: 91 fL (ref 78.0–100.0)
RDW: 14.9 % (ref 11.5–15.5)
WBC: 12.7 10*3/uL — ABNORMAL HIGH (ref 4.0–10.5)

## 2011-12-08 LAB — BASIC METABOLIC PANEL
BUN: 16 mg/dL (ref 6–23)
Chloride: 102 mEq/L (ref 96–112)
Creatinine, Ser: 1.14 mg/dL (ref 0.50–1.35)
GFR calc Af Amer: 86 mL/min — ABNORMAL LOW (ref >90)

## 2011-12-08 MED ORDER — LORAZEPAM 2 MG/ML IJ SOLN
1.0000 mg | Freq: Once | INTRAMUSCULAR | Status: AC
  Administered 2011-12-08: 2 mg via INTRAVENOUS

## 2011-12-08 MED ORDER — PHENOBARBITAL 32.4 MG PO TABS
97.2000 mg | ORAL_TABLET | Freq: Two times a day (BID) | ORAL | Status: DC
  Administered 2011-12-08: 97.2 mg via ORAL
  Filled 2011-12-08: qty 3

## 2011-12-08 MED ORDER — LORAZEPAM 2 MG/ML IJ SOLN
INTRAMUSCULAR | Status: AC
  Administered 2011-12-08: 2 mg
  Filled 2011-12-08: qty 1

## 2011-12-08 MED ORDER — PHENOBARBITAL 60 MG PO TABS: 120.0000 mg | ORAL_TABLET | Freq: Every day | ORAL | Status: DC

## 2011-12-08 MED ORDER — PHENOBARBITAL 32.4 MG PO TABS
32.4000 mg | ORAL_TABLET | Freq: Two times a day (BID) | ORAL | Status: DC
  Administered 2011-12-08: 32.4 mg via ORAL
  Filled 2011-12-08: qty 1

## 2011-12-08 MED ORDER — PHENOBARBITAL 30 MG PO TABS
30.0000 mg | ORAL_TABLET | Freq: Two times a day (BID) | ORAL | Status: DC
  Filled 2011-12-08: qty 1

## 2011-12-08 MED ORDER — LORAZEPAM 2 MG PO TABS: 2.0000 mg | ORAL_TABLET | Freq: Three times a day (TID) | ORAL | Status: AC | PRN

## 2011-12-08 MED ORDER — PHENOBARBITAL 100 MG PO TABS
100.0000 mg | ORAL_TABLET | Freq: Two times a day (BID) | ORAL | Status: DC
  Filled 2011-12-08: qty 1

## 2011-12-08 NOTE — ED Notes (Signed)
ZOX:[id number]<AV> Expected date:12/08/11<BR> Expected time: 3:57 PM<BR> Means of arrival:Ambulance<BR> Comments:<BR> M11. 50 uyo m. Sz, hf same. Post ictal. 10 mins

## 2011-12-08 NOTE — ED Notes (Signed)
Per EMS: pt from home with hx of seizures. Ran out of medication and had seizure today at family members house. Family at bedside. Pt in NAD.

## 2011-12-08 NOTE — ED Provider Notes (Signed)
History     CSN: [id number]  Arrival date & time 12/08/11  1609   First MD Initiated Contact with Patient 12/08/11 1710      Chief Complaint  Patient presents with  . Seizures    (Consider location/radiation/quality/duration/timing/severity/associated sxs/prior treatment) Patient is a 51 y.o. male presenting with seizures. The history is provided by a relative. The history is limited by the condition of the patient (post ictal).  Seizures   He has a history of a seizure disorder for which he takes phenobarbital and did take his dose yesterday. He had a seizure last night and another one today. While in the emergency department, he had a third seizure. Seizures are generalized with a post ictal state. He did wake up completely following the 2 seizures prior to his being in the emergency department. There is no associated lip or tongue. Last seizure had been about 3 months ago.  Past Medical History  Diagnosis Date  . CAD (coronary artery disease)   . HLD (hyperlipidemia)   . Seizure disorder   . Angina   . Seizures   . Hypertension   . Myocardial infarction     Past Surgical History  Procedure Date  . Carotid stent jan. 2009, apr. 2010    proximal left circumflex artery  . Cardiac catheterization     History reviewed. No pertinent family history.  History  Substance Use Topics  . Smoking status: Smoker, Current Status Unknown -- 1.0 packs/day for 35 years    Types: Cigarettes  . Smokeless tobacco: Not on file  . Alcohol Use: 0.6 oz/week    1 Cans of beer per week     occasionally      Review of Systems  Unable to perform ROS: Mental status change  Neurological: Positive for seizures.    Allergies  Food allergy formula  Home Medications   Current Outpatient Rx  Name Route Sig Dispense Refill  . ASPIRIN 81 MG PO TABS Oral Take 81 mg by mouth daily.      [name] CLOPIDOGREL BISULFATE 75 MG PO TABS Oral Take 1 tablet (75 mg total) by mouth daily. 30 tablet 3  .  IBUPROFEN 200 MG PO TABS Oral Take 200 mg by mouth every 6 (six) hours as needed.    [name] LOVASTATIN 20 MG PO TABS Oral Take 20 mg by mouth daily.      [name] NITROGLYCERIN 0.4 MG SL SUBL Sublingual Place 0.4 mg under the tongue every 5 (five) minutes as needed.      [name] PHENOBARBITAL 100 MG PO TABS Oral Take 100 mg by mouth daily.       BP 123/61  Pulse 107  Temp(Src) 98.7 F (37.1 C) (Oral)  Resp 13  SpO2 99%  Physical Exam  Nursing note and vitals reviewed. 51 year old male who is post ictal but in no acute distress. Vital signs are significant for tachycardia with heart rate 107. Oxygen saturation is 99% which is normal. Head is normocephalic and atraumatic. PERRLA. He cannot cooperate for EOM exam because of post ictal state. Oropharynx shows poor dentition but no trauma. Neck is nontender and supple. Back is nontender. Lungs are clear without rales, wheezes, rhonchi. Heart is regular rate and rhythm without murmur. Abdomen is soft, flat without masses or hepatosplenomegaly. Extremities showed no trauma, no cyanosis or edema. Skin is warm and dry without rash. Neurologic: He is post ictal with no focal neurologic findings.  ED Course  Procedures (including critical care time)  Results  for orders placed during the hospital encounter of 12/08/11  CBC      Component Value Range   WBC 12.7 (*) 4.0 - 10.5 (K/uL)   RBC 4.22  4.22 - 5.81 (MIL/uL)   Hemoglobin 12.6 (*) 13.0 - 17.0 (g/dL)   HCT 54.0 (*) 98.1 - 52.0 (%)   MCV 91.0  78.0 - 100.0 (fL)   MCH 29.9  26.0 - 34.0 (pg)   MCHC 32.8  30.0 - 36.0 (g/dL)   RDW 19.1  47.8 - 29.5 (%)   Platelets 358  150 - 400 (K/uL)  DIFFERENTIAL      Component Value Range   Neutrophils Relative 60  43 - 77 (%)   Lymphocytes Relative 29  12 - 46 (%)   Monocytes Relative 9  3 - 12 (%)   Eosinophils Relative 2  0 - 5 (%)   Basophils Relative 0  0 - 1 (%)   Neutro Abs 7.6  1.7 - 7.7 (K/uL)   Lymphs Abs 3.7  0.7 - 4.0 (K/uL)   Monocytes Absolute 1.1 (*) 0.1  - 1.0 (K/uL)   Eosinophils Absolute 0.3  0.0 - 0.7 (K/uL)   Basophils Absolute 0.0  0.0 - 0.1 (K/uL)   Smear Review MORPHOLOGY UNREMARKABLE    BASIC METABOLIC PANEL      Component Value Range   Sodium 144  135 - 145 (mEq/L)   Potassium 3.3 (*) 3.5 - 5.1 (mEq/L)   Chloride 102  96 - 112 (mEq/L)   CO2 12 (*) 19 - 32 (mEq/L)   Glucose, Bld 115 (*) 70 - 99 (mg/dL)   BUN 16  6 - 23 (mg/dL)   Creatinine, Ser 6.21  0.50 - 1.35 (mg/dL)   Calcium 9.7  8.4 - 30.8 (mg/dL)   GFR calc non Af Amer 74 (*) >90 (mL/min)   GFR calc Af Amer 86 (*) >90 (mL/min)  PHENOBARBITAL LEVEL      Component Value Range   Phenobarbital <2.4 (*) 15.0 - 40.0 (ug/mL)  GLUCOSE, CAPILLARY      Component Value Range   Glucose-Capillary 79  70 - 99 (mg/dL)   Comment 1 Notify RN     He required Ativan following his seizure. He has had no further seizures in the emergency department and is now awake, alert, and oriented. Blood phenobarbital level has come back 0 indicating that subtherapeutic anticonvulsant levels the reason he had a seizures. He does state that he ran out of his medication yesterday. The bottle shows he was taking 120 mg of phenobarbital once a day. He is given a dose in the emergency department. Because he had several seizures and required Ativan, and he is also given a prescription for Ativan 2 mg #15 to take if needed if he has a seizures try and prevent additional seizures. He does not have a primary care physician. He will be given resource guide to try and get established with a PCP and appeared distressed that he should never allow his prescription for phenobarbital to run out.  1. Seizure       MDM   seizure in a patient who is compliant with his medication according to family. Blood level will need to be obtained of his phenobarbital and decision made whether he needs to have additional anticonvulsants added to his regimen. Old charts are reviewed, and he has had prior ED visits for  seizures.        Dione Booze, MD 12/08/11 2045

## 2012-02-10 ENCOUNTER — Encounter (HOSPITAL_COMMUNITY): Payer: Self-pay | Admitting: Emergency Medicine

## 2012-02-10 ENCOUNTER — Emergency Department (HOSPITAL_COMMUNITY)
Admission: EM | Admit: 2012-02-10 | Discharge: 2012-02-11 | Disposition: A | Discharge status: Home / Self Care | Payer: Self-pay | Attending: Emergency Medicine | Admitting: Emergency Medicine

## 2012-02-10 DIAGNOSIS — E785 Hyperlipidemia, unspecified: Secondary | ICD-10-CM | POA: Insufficient documentation

## 2012-02-10 DIAGNOSIS — R51 Headache: Secondary | ICD-10-CM | POA: Insufficient documentation

## 2012-02-10 DIAGNOSIS — I2510 Atherosclerotic heart disease of native coronary artery without angina pectoris: Secondary | ICD-10-CM | POA: Insufficient documentation

## 2012-02-10 DIAGNOSIS — I252 Old myocardial infarction: Secondary | ICD-10-CM | POA: Insufficient documentation

## 2012-02-10 DIAGNOSIS — R569 Unspecified convulsions: Secondary | ICD-10-CM | POA: Insufficient documentation

## 2012-02-10 DIAGNOSIS — I10 Essential (primary) hypertension: Secondary | ICD-10-CM | POA: Insufficient documentation

## 2012-02-10 MED ORDER — PHENOBARBITAL 60 MG PO TABS: 60.0000 mg | ORAL_TABLET | Freq: Two times a day (BID) | ORAL | Status: DC

## 2012-02-10 NOTE — ED Notes (Signed)
CBG 125 °

## 2012-02-10 NOTE — ED Notes (Signed)
Has missed doses of his Phenobarbital

## 2012-02-10 NOTE — ED Notes (Signed)
ZOX:[id number]<AV> Expected date:<BR> Expected time:<BR> Means of arrival:<BR> Comments:<BR> EMS 10-seizure-male

## 2012-02-10 NOTE — ED Provider Notes (Signed)
History     CSN: [id number]  Arrival date & time 02/10/12  2210   First MD Initiated Contact with Patient 02/10/12 2249      Chief Complaint  Patient presents with  . Seizures    C&A X 3 then X4 not Post Ictal    (Consider location/radiation/quality/duration/timing/severity/associated sxs/prior treatment) HPI This 51 year old male has a known seizure disorder, PA witnessed generalized seizure at home today without trauma, is now recovered fully and feels back to baseline except for his typical mild post ictal headache. He is no altered mental status, he is no change in speech or vision swallowing or understanding, he has no localizing or lateralizing weakness or numbness, he has no neck pain back pain chest pain shortness breath abdominal pain or other concerns. He is almost out of his phenobarbital and skipped his medicine this morning. He has not had a seizure in the last couple of months. 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 He does not want imaging or testing in the ED he feels well and wants to go home, his mom agrees with this assessment and plan as well. Past Medical History  Diagnosis Date  . CAD (coronary artery disease)   . HLD (hyperlipidemia)   . Seizure disorder   . Angina   . Seizures   . Hypertension   . Myocardial infarction     Past Surgical History  Procedure Date  . Carotid stent jan. 2009, apr. 2010    proximal left circumflex artery  . Cardiac catheterization     No family history on file.  History  Substance Use Topics  . Smoking status: Smoker, Current Status Unknown -- 1.0 packs/day for 35 years    Types: Cigarettes, Cigars  . Smokeless tobacco: Not on file  . Alcohol Use: No     occasionally      Review of Systems  Constitutional: Negative for fever.       10 Systems reviewed and are negative for acute change except as noted in the HPI.  HENT: Negative for congestion.     Eyes: Negative for discharge and redness.  Respiratory: Negative for cough and shortness of breath.   Cardiovascular: Negative for chest pain.  Gastrointestinal: Negative for vomiting and abdominal pain.  Musculoskeletal: Negative for back pain.  Skin: Negative for rash.  Neurological: Positive for headaches. Negative for syncope and numbness.  Psychiatric/Behavioral:       No behavior change.    Allergies  Food allergy formula  Home Medications   Current Outpatient Rx  Name Route Sig Dispense Refill  . ASPIRIN 81 MG PO TABS Oral Take 81 mg by mouth daily.      [name] CLOPIDOGREL BISULFATE 75 MG PO TABS Oral Take 1 tablet (75 mg total) by mouth daily. 30 tablet 3  . IBUPROFEN 200 MG PO TABS Oral Take 600 mg by mouth every 6 (six) hours as needed. Dental pain    . LOVASTATIN 20 MG PO TABS Oral Take 20 mg by mouth daily.      [name] NAPROXEN SODIUM 220 MG PO TABS Oral Take 440 mg by mouth 2 (two) times daily as needed. Dental pain    . NITROGLYCERIN 0.4 MG SL SUBL Sublingual Place 0.4 mg under the tongue every 5 (five) minutes as needed.      [name] PHENOBARBITAL 60 MG PO TABS Oral Take 2 tablets (120 mg total) by mouth daily. 60 tablet 0  .  PHENOBARBITAL 60 MG PO TABS Oral Take 1 tablet (60 mg total) by mouth 2 (two) times daily. 30 tablet 0    BP 104/70  Pulse 73  Temp(Src) 98.2 F (36.8 C) (Oral)  Resp 16  SpO2 100%  Physical Exam  Nursing note and vitals reviewed. Constitutional: He is oriented to person, place, and time.       Awake, alert, nontoxic appearance with baseline speech for patient.  HENT:  Head: Atraumatic.  Mouth/Throat: No oropharyngeal exudate.  Eyes: EOM are normal. Pupils are equal, round, and reactive to light. Right eye exhibits no discharge. Left eye exhibits no discharge.  Neck: Neck supple.  Cardiovascular: Normal rate and regular rhythm.   No murmur heard. Pulmonary/Chest: Effort normal and breath sounds normal. No stridor. No respiratory distress. He has no  wheezes. He has no rales. He exhibits no tenderness.  Abdominal: Soft. Bowel sounds are normal. He exhibits no mass. There is no tenderness. There is no rebound.  Musculoskeletal: He exhibits no tenderness.       Baseline ROM, moves extremities with no obvious new focal weakness.  Lymphadenopathy:    He has no cervical adenopathy.  Neurological: He is alert and oriented to person, place, and time.       Awake, alert, cooperative and aware of situation; motor strength bilaterally; sensation normal to light touch bilaterally; peripheral visual fields full to confrontation; no facial asymmetry; tongue midline; major cranial nerves appear intact; no pronator drift, normal finger to nose bilaterally  Skin: No rash noted.  Psychiatric: He has a normal mood and affect.    ED Course  Procedures (including critical care time) This patient was ready for discharge he had a witnessed generalized seizure so Ativan was given and the patient was placed back on pulse oximetry.  Pt feels improved after observation and/or treatment in ED.Patient / Family / Caregiver informed of clinical course, understand medical decision-making process, and agree with plan. Labs Reviewed - No data to display No results found.   1. Seizure       MDM  I doubt any other EMC precluding discharge at this time including, but not necessarily limited to the following:status epilepticus.        Hurman Horn, MD 02/20/12 1228

## 2012-02-10 NOTE — Discharge Instructions (Signed)
Epilepsy People with epilepsy have times when they shake and jerk uncontrollably (seizures). This happens when there is a sudden change in brain function. Epilepsy may have many possible causes. Anything that disturbs the normal pattern of brain cell activity can lead to seizures. HOME CARE   Listen to your doctor about driving and safety during normal activities.   Only take medicine as told by your doctor.   Take blood tests as told by your doctor.   Tell the people you live and work with that you have seizures. Make sure they know how to help you. They should:   Cushion your head and body.   Turn you on your side.   Not restrain you.   Not place anything inside your mouth.   Call for local emergency medical help if there is any question about what has happened.   Write down when your seizures happen and what you remember about each seizure. Write down anything you think may have caused the seizure to happen (trigger).   Keep all follow-up visits with your doctor. This is very important.  GET HELP RIGHT AWAY IF:   You get an infection or start to feel sick. You may have more seizures when you are sick.   You are having seizures more often.   Your seizure pattern is changing.   A seizure does not stop after a few seconds or minutes.   A seizure causes you to have trouble breathing.   A seizure gives you a very bad headache.   A seizure makes you unable to speak or use a part of your body.  MAKE SURE YOU:   Understand these instructions.   Will watch your condition.   Will get help right away if you are not doing well or get worse.  Document Released: 08/04/2009 Document Revised: 09/26/2011 Document Reviewed: 08/04/2009 [facility] Patient Information 2012 [place], [place].  Medical conditions can also worsen, so it is also important to return immediately as directed below, or if you have other serious concerns develop. RETURN IMMEDIATELY IF you develop new shortness of  breath, chest pain, fever, have difficulty moving parts of your body (new weakness, numbness, or incoordination), sudden change in speech, vision, swallowing, or understanding, faint or develop new dizziness, severe headache, become poorly responsive or have an altered mental status compared to baseline for you, new rash, abdominal pain, or bloody stools,  Return sooner also if you develop new problems for which you have not talked to your caregiver but you feel may be emergency medical conditions, or are unable to be cared for safely at home.

## 2012-02-11 MED ORDER — LORAZEPAM 2 MG/ML IJ SOLN
2.0000 mg | Freq: Once | INTRAMUSCULAR | Status: AC
  Administered 2012-02-11: 2 mg via INTRAMUSCULAR
  Filled 2012-02-11: qty 1

## 2012-02-11 NOTE — ED Notes (Signed)
DC'd for primary nurse per Charge nurse orders

## 2012-02-11 NOTE — ED Notes (Signed)
Patient sent home with family. Has taken 2 of his siezure pills and was given one prescription for refill of his medication.

## 2012-11-09 ENCOUNTER — Encounter (HOSPITAL_COMMUNITY): Payer: Self-pay | Admitting: Emergency Medicine

## 2012-11-09 ENCOUNTER — Other Ambulatory Visit: Payer: Self-pay

## 2012-11-09 ENCOUNTER — Emergency Department (HOSPITAL_COMMUNITY): Payer: Self-pay

## 2012-11-09 ENCOUNTER — Observation Stay (HOSPITAL_COMMUNITY)
Admission: EM | Admit: 2012-11-09 | Discharge: 2012-11-10 | Disposition: A | Discharge status: Home / Self Care | Payer: Medicaid Other | Attending: Internal Medicine | Admitting: Internal Medicine

## 2012-11-09 DIAGNOSIS — E785 Hyperlipidemia, unspecified: Secondary | ICD-10-CM

## 2012-11-09 DIAGNOSIS — Z87891 Personal history of nicotine dependence: Secondary | ICD-10-CM | POA: Diagnosis present

## 2012-11-09 DIAGNOSIS — I10 Essential (primary) hypertension: Secondary | ICD-10-CM

## 2012-11-09 DIAGNOSIS — E7800 Pure hypercholesterolemia, unspecified: Secondary | ICD-10-CM | POA: Diagnosis present

## 2012-11-09 DIAGNOSIS — Z720 Tobacco use: Secondary | ICD-10-CM | POA: Diagnosis present

## 2012-11-09 DIAGNOSIS — R569 Unspecified convulsions: Secondary | ICD-10-CM

## 2012-11-09 DIAGNOSIS — G40909 Epilepsy, unspecified, not intractable, without status epilepticus: Secondary | ICD-10-CM | POA: Insufficient documentation

## 2012-11-09 DIAGNOSIS — I2510 Atherosclerotic heart disease of native coronary artery without angina pectoris: Secondary | ICD-10-CM

## 2012-11-09 DIAGNOSIS — R079 Chest pain, unspecified: Principal | ICD-10-CM

## 2012-11-09 DIAGNOSIS — Z91199 Patient's noncompliance with other medical treatment and regimen due to unspecified reason: Secondary | ICD-10-CM | POA: Insufficient documentation

## 2012-11-09 DIAGNOSIS — Z9861 Coronary angioplasty status: Secondary | ICD-10-CM | POA: Insufficient documentation

## 2012-11-09 DIAGNOSIS — Z9119 Patient's noncompliance with other medical treatment and regimen: Secondary | ICD-10-CM | POA: Insufficient documentation

## 2012-11-09 DIAGNOSIS — F17200 Nicotine dependence, unspecified, uncomplicated: Secondary | ICD-10-CM

## 2012-11-09 HISTORY — DX: Nicotine dependence, unspecified, uncomplicated: F17.200

## 2012-11-09 MED ORDER — NITROGLYCERIN 2 % TD OINT
0.5000 [in_us] | TOPICAL_OINTMENT | Freq: Once | TRANSDERMAL | Status: AC
  Administered 2012-11-09: 0.5 [in_us] via TOPICAL
  Filled 2012-11-09: qty 1

## 2012-11-09 MED ORDER — SODIUM CHLORIDE 0.9 % IV SOLN
1000.0000 mL | INTRAVENOUS | Status: DC
  Administered 2012-11-10 (×2): 1000 mL via INTRAVENOUS

## 2012-11-09 NOTE — ED Notes (Addendum)
Pt brought to ED via [facility] for evaluation of left sided chest pain that occurred after the patients 4th seizure today, EMS denies post ictal state post seizure, cardiac history and hx of seizures in the past.  Pt denies any N/V/D.  Complaining of lower left back pain upon arrival to ED.  Placed on cardiac monitor.  Pt in NAD at this time.

## 2012-11-09 NOTE — ED Provider Notes (Signed)
History     CSN: [id number]  Arrival date & time 11/09/12  2317   First MD Initiated Contact with Patient 11/09/12 2338      Chief Complaint  Patient presents with  . Chest Pain  . Seizures    (Consider location/radiation/quality/duration/timing/severity/associated sxs/prior treatment) HPIDwayne E Myers is a 52 y.o. male with a known history of seizures taking phenobarbital, patient is not skip any medication and denies any drug or alcohol use says he has 3-4 seizures today. These were witnessed by his mother. She says they were "small seizures" patient made a staccato coughing sound leaned over to the left had some abnormal breathing in and recovered which was followed by about 20-30 minutes postictal state, no tonic-clonic movements, did not injure himself, did not fall, no tongue biting or incontinence.   This evening after one of his episodes of seizure activity, he started having chest pain, his chest pain is left-sided the chest it is sharp, it is constant, it's gotten worse and worse and is now located under the left arm and radiates to the back. 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  Past Medical History  Diagnosis Date  . CAD (coronary artery disease)   . HLD (hyperlipidemia)   . Seizure disorder   . Angina   . Seizures   . Hypertension   . Myocardial infarction   . TOBACCO ABUSE 02/22/2009    Past Surgical History  Procedure Date  . Carotid stent jan. 2009, apr. 2010    proximal left circumflex artery  . Cardiac catheterization     No family history on file.  History  Substance Use Topics  . Smoking status: Smoker, Current Status Unknown -- 1.0 packs/day for 35 years    Types: Cigarettes, Cigars  . Smokeless tobacco: Not on file  . Alcohol Use: No     Comment: occasionally      Review of Systems At least 10pt or greater review of systems completed and are negative except where specified in the  HPI.  Allergies  Food allergy formula  Home Medications   Current Outpatient Rx  Name  Route  Sig  Dispense  Refill  . ASPIRIN 81 MG PO TABS   Oral   Take 81 mg by mouth daily.           [name] CLOPIDOGREL BISULFATE 75 MG PO TABS   Oral   Take 1 tablet (75 mg total) by mouth daily.   30 tablet   3   . IBUPROFEN 200 MG PO TABS   Oral   Take 600 mg by mouth every 6 (six) hours as needed. Dental pain         . LOVASTATIN 20 MG PO TABS   Oral   Take 20 mg by mouth daily.           [name] NAPROXEN SODIUM 220 MG PO TABS   Oral   Take 440 mg by mouth 2 (two) times daily as needed. Dental pain         . NITROGLYCERIN 0.4 MG SL SUBL   Sublingual   Place 0.4 mg under the tongue every 5 (five) minutes as needed.           [name] PHENOBARBITAL 60 MG PO TABS   Oral   Take 2 tablets (120 mg total) by mouth daily.   60 tablet   0   . PHENOBARBITAL 60 MG PO  TABS   Oral   Take 1 tablet (60 mg total) by mouth 2 (two) times daily.   30 tablet   0     BP 127/90  Pulse 85  Temp 98.8 F (37.1 C) (Oral)  Resp 14  SpO2 100%  Physical Exam  Nursing notes reviewed.  Electronic medical record reviewed. VITAL SIGNS:   Filed Vitals:   11/09/12 2327  BP: 127/90  Pulse: 85  Temp: 98.8 F (37.1 C)  TempSrc: Oral  Resp: 14  SpO2: 100%   CONSTITUTIONAL: Awake, oriented, appears non-toxic HENT: Atraumatic, normocephalic, oral mucosa pink and moist, airway patent. Poor dentition. Nares patent without drainage. External ears normal. EYES: Conjunctiva clear, EOMI, PERRLA NECK: Trachea midline, non-tender, supple CARDIOVASCULAR: Normal heart rate, Normal rhythm, No murmurs, rubs, gallops PULMONARY/CHEST: Clear to auscultation, no rhonchi, wheezes, or rales. Symmetrical breath sounds. Non-tender. ABDOMINAL: Non-distended, soft, non-tender - no rebound or guarding.  BS normal. NEUROLOGIC: Non-focal, moving all four extremities, no gross sensory or motor deficits. EXTREMITIES: No  clubbing, cyanosis, or edema SKIN: Warm, Dry, No erythema, No rash  ED Course  Procedures (including critical care time)  Date: 11/11/2012  Rate: 76  Rhythm: normal sinus rhythm  QRS Axis: normal  Intervals: normal  ST/T Wave abnormalities: normal  Conduction Disutrbances: none  Narrative Interpretation: Possible LVH, no ST or T wave abnormalities suggestive of ischemia or infarction. PVC.   Labs Reviewed  CBC - Abnormal; Notable for the following:    RBC 4.15 (*)     Hemoglobin 12.9 (*)     HCT 36.6 (*)     All other components within normal limits  COMPREHENSIVE METABOLIC PANEL - Abnormal; Notable for the following:    Total Protein 8.6 (*)     Total Bilirubin 0.2 (*)     All other components within normal limits  URINE RAPID DRUG SCREEN (HOSP PERFORMED) - Abnormal; Notable for the following:    Barbiturates POSITIVE (*)     All other components within normal limits  TROPONIN I  PHENOBARBITAL LEVEL  ETHANOL  TSH  TROPONIN I   Dg Chest Portable 1 View  11/10/2012  *RADIOLOGY REPORT*  Clinical Data: Chest pain and seizures.  PORTABLE CHEST - 1 VIEW  Comparison: 03/16/2009 and chest CT from 04/06/2011  Findings: Single view of the chest was obtained.  Heart size is upper limits of normal.  The ascending thoracic aorta appears to be prominent.  The lungs are clear without airspace disease or edema. Trachea is midline.  IMPRESSION: No acute chest findings.  The ascending thoracic aortic shadow is prominent and compatible with known enlargement or ectasia of the ascending thoracic aorta.   Original Report Authenticated By: Richarda Overlie, M.D.      1. Chest pain at rest   2. Other convulsions   3. Tobacco use disorder   4. HYPERLIPIDEMIA   5. TOBACCO ABUSE   6. HYPERTENSION   7. CAD   8. SEIZURE DISORDER      MDM  [name] is a 52 y.o. male with a history of medication noncompliance, seizures and coronary artery disease presents with history of seizures and chest pain.   Patient last had cardiac catheterization in 2012 by Dr. 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  Initial lab results are unremarkable, patient is therapeutic on his phenobarbital level, troponin is negative. Discussed with Dr. Thad Ranger the neural hospitalist secondary to breakthrough seizures.  In this patient with history of medical noncompliance, prior  stent thrombosis, he will need a rule out for his chest pain especially when he says it feels just like his prior MIs.  Discussed with hospitalist Dr. Conley Rolls for admission.   Jones Skene, MD 11/11/12 0145

## 2012-11-10 ENCOUNTER — Encounter (HOSPITAL_COMMUNITY): Payer: Self-pay | Admitting: Internal Medicine

## 2012-11-10 DIAGNOSIS — R079 Chest pain, unspecified: Secondary | ICD-10-CM

## 2012-11-10 LAB — TROPONIN I
Troponin I: <0.3 ng/mL (ref <0.30)
Troponin I: <0.3 ng/mL (ref <0.30)

## 2012-11-10 LAB — CBC
Hemoglobin: 12.9 g/dL — ABNORMAL LOW (ref 13.0–17.0)
MCH: 31.1 pg (ref 26.0–34.0)
MCHC: 35.2 g/dL (ref 30.0–36.0)
MCV: 88.2 fL (ref 78.0–100.0)

## 2012-11-10 LAB — COMPREHENSIVE METABOLIC PANEL
ALT: 13 U/L (ref 0–53)
CO2: 24 mEq/L (ref 19–32)
Calcium: 9.6 mg/dL (ref 8.4–10.5)
Creatinine, Ser: 0.85 mg/dL (ref 0.50–1.35)
GFR calc Af Amer: >90 mL/min (ref >90)
GFR calc non Af Amer: >90 mL/min (ref >90)
Glucose, Bld: 85 mg/dL (ref 70–99)
Sodium: 135 mEq/L (ref 135–145)
Total Protein: 8.6 g/dL — ABNORMAL HIGH (ref 6.0–8.3)

## 2012-11-10 LAB — RAPID URINE DRUG SCREEN, HOSP PERFORMED
Amphetamines: NOT DETECTED
Opiates: NOT DETECTED

## 2012-11-10 LAB — ETHANOL: Alcohol, Ethyl (B): <11 mg/dL (ref 0–11)

## 2012-11-10 LAB — PHENOBARBITAL LEVEL: Phenobarbital: 15.4 ug/mL (ref 15.0–40.0)

## 2012-11-10 MED ORDER — NITROGLYCERIN 0.4 MG SL SUBL: 0.4000 mg | SUBLINGUAL_TABLET | SUBLINGUAL | Status: DC | PRN

## 2012-11-10 MED ORDER — SODIUM CHLORIDE 0.9 % IJ SOLN: 3.0000 mL | Freq: Two times a day (BID) | INTRAMUSCULAR | Status: DC

## 2012-11-10 MED ORDER — SODIUM CHLORIDE 0.9 % IV SOLN: 250.0000 mL | INTRAVENOUS | Status: DC | PRN

## 2012-11-10 MED ORDER — ONDANSETRON HCL 4 MG/2ML IJ SOLN: 4.0000 mg | Freq: Four times a day (QID) | INTRAMUSCULAR | Status: DC | PRN

## 2012-11-10 MED ORDER — PHENOBARBITAL 30 MG PO TABS: 120.0000 mg | ORAL_TABLET | Freq: Every day | ORAL | Status: DC

## 2012-11-10 MED ORDER — PHENOBARBITAL 32.4 MG PO TABS: 97.2000 mg | ORAL_TABLET | Freq: Every day | ORAL | Status: DC

## 2012-11-10 MED ORDER — SODIUM CHLORIDE 0.9 % IJ SOLN: 3.0000 mL | INTRAMUSCULAR | Status: DC | PRN

## 2012-11-10 MED ORDER — ASPIRIN 81 MG PO TABS: 81.0000 mg | ORAL_TABLET | Freq: Every day | ORAL | Status: DC

## 2012-11-10 MED ORDER — PHENOBARBITAL 97.2 MG PO TABS: 97.2000 mg | ORAL_TABLET | Freq: Every evening | ORAL | Status: DC

## 2012-11-10 MED ORDER — NITROGLYCERIN 2 % TD OINT
0.5000 [in_us] | TOPICAL_OINTMENT | Freq: Four times a day (QID) | TRANSDERMAL | Status: DC
  Administered 2012-11-10: 0.5 [in_us] via TOPICAL
  Filled 2012-11-10: qty 30

## 2012-11-10 MED ORDER — HEPARIN BOLUS VIA INFUSION
4000.0000 [IU] | Freq: Once | INTRAVENOUS | Status: AC
  Administered 2012-11-10: 4000 [IU] via INTRAVENOUS
  Filled 2012-11-10: qty 4000

## 2012-11-10 MED ORDER — DOCUSATE SODIUM 100 MG PO CAPS
100.0000 mg | ORAL_CAPSULE | Freq: Two times a day (BID) | ORAL | Status: DC
  Filled 2012-11-10 (×2): qty 1

## 2012-11-10 MED ORDER — ASPIRIN 81 MG PO CHEW
81.0000 mg | CHEWABLE_TABLET | Freq: Every day | ORAL | Status: DC
  Administered 2012-11-10: 81 mg via ORAL
  Filled 2012-11-10: qty 1

## 2012-11-10 MED ORDER — PHENOBARBITAL 32.4 MG PO TABS
32.4000 mg | ORAL_TABLET | Freq: Every day | ORAL | Status: DC
Start: 2012-11-10 — End: 2012-11-10
  Administered 2012-11-10: 32.4 mg via ORAL
  Filled 2012-11-10: qty 1

## 2012-11-10 MED ORDER — HEPARIN (PORCINE) IN NACL 100-0.45 UNIT/ML-% IJ SOLN
1000.0000 [IU]/h | INTRAMUSCULAR | Status: DC
  Administered 2012-11-10: 1000 [IU]/h via INTRAVENOUS
  Filled 2012-11-10: qty 250

## 2012-11-10 MED ORDER — PHENOBARBITAL 60 MG PO TABS: 60.0000 mg | ORAL_TABLET | Freq: Every morning | ORAL | Status: DC

## 2012-11-10 MED ORDER — ONDANSETRON HCL 4 MG PO TABS: 4.0000 mg | ORAL_TABLET | Freq: Four times a day (QID) | ORAL | Status: DC | PRN

## 2012-11-10 MED ORDER — MORPHINE SULFATE 2 MG/ML IJ SOLN
2.0000 mg | INTRAMUSCULAR | Status: DC | PRN
  Administered 2012-11-10: 2 mg via INTRAVENOUS
  Filled 2012-11-10: qty 1

## 2012-11-10 NOTE — ED Notes (Signed)
Patient does not want to be admitted into the hospital and wants to leave AMA. Patient's family at bedside and can not convince pt to stay. RN in to speak to patient. Patient has agreed to stay. Informed EDP and Dr. Conley Rolls. Patient will be admitted.

## 2012-11-10 NOTE — H&P (Signed)
[facility] History and Physical  [name] JWJ:[id number] DOB: [birth year]    PCP:   Burtis Junes, MD   Chief Complaint: Seizure and chest pain.  HPI: [name] is an 52 y.o. male with hx of seizure, known severe CAD with 4 cardiac stents, HTN, several prior MI, tobacco use, noncompliant with Plavix due to cost, presents to the ER with several small episodes of seizures today and a generalized tonic clonic seizure.  When he came through after the post ictal period, he complained of substernal chest pain, about 8/10, with radiation to his left arm, now 4/10, described as similar to his prior MI.  He didn't recall if he had nausea, vomiting, or diaphoresis.  He was not short of breath.  Evaluation in the ER included a phenobarb level of 15, and EKG with slight peak T waves essentially unchanged from the old, and negative initial cardiac marker.  His CXR showed no acute finding.  He has normal WBC and Hb of 12.9 grams per DL.  Initially, he was very agitated, calling me ignorant and refused to stay, requesting AMA, but after his family spoke to him, he agreed to be admitted.  His family states he is known to be hostile, and had signed out AMA before.  Rewiew of Systems:  Constitutional: Negative for malaise, fever and chills. No significant weight loss or weight gain Eyes: Negative for eye pain, redness and discharge, diplopia, visual changes, or flashes of light. ENMT: Negative for ear pain, hoarseness, nasal congestion, sinus pressure and sore throat. No headaches; tinnitus, drooling, or problem swallowing. Cardiovascular: Negative for  palpitations, diaphoresis, dyspnea and peripheral edema. ; No orthopnea, PND Respiratory: Negative for cough, hemoptysis, wheezing and stridor. No pleuritic chestpain. Gastrointestinal: Negative for nausea, vomiting, diarrhea, constipation, abdominal pain, melena, blood in stool, hematemesis, jaundice and rectal bleeding.    Genitourinary:  Negative for frequency, dysuria, incontinence,flank pain and hematuria; Musculoskeletal: Negative for back pain and neck pain. Negative for swelling and trauma.;  Skin: . Negative for pruritus, rash, abrasions, bruising and skin lesion.; ulcerations Neuro: Negative for headache, lightheadedness and neck stiffness. Negative for weakness, altered level of consciousness , extremity weakness, burning feet, involuntary movement,and syncope.  Psych: negative for anxiety, depression, insomnia, tearfulness, panic attacks, hallucinations, paranoia, suicidal or homicidal ideation    Past Medical History  Diagnosis Date  . CAD (coronary artery disease)   . HLD (hyperlipidemia)   . Seizure disorder   . Angina   . Seizures   . Hypertension   . Myocardial infarction   . TOBACCO ABUSE 02/22/2009    Past Surgical History  Procedure Date  . Carotid stent jan. 2009, apr. 2010    proximal left circumflex artery  . Cardiac catheterization     Medications:  HOME MEDS: Prior to Admission medications   Medication Sig Start Date End Date Taking? Authorizing Provider  aspirin 81 MG tablet Take 81 mg by mouth daily.     Yes Historical Provider, MD  naproxen sodium (ANAPROX) 220 MG tablet Take 440 mg by mouth 2 (two) times daily as needed. Dental pain   Yes Historical Provider, MD  nitroGLYCERIN (NITROSTAT) 0.4 MG SL tablet Place 0.4 mg under the tongue every 5 (five) minutes as needed.     Yes Historical Provider, MD  PHENObarbital (LUMINAL) 60 MG tablet Take 2 tablets (120 mg total) by mouth daily. 12/08/11  Yes Dione Booze, MD     Allergies:  Allergies  Allergen Reactions  . Food  Allergy Formula Other (See Comments)    Sage Causes seizures:    Social History:   reports that he has been smoking Cigarettes and Cigars.  He has a 35 pack-year smoking history. He does not have any smokeless tobacco history on file. He reports that he does not drink alcohol or use illicit drugs.  Family History: No  family history on file.   Physical Exam: Filed Vitals:   11/09/12 2327 11/10/12 0018 11/10/12 0200 11/10/12 0300  BP: 127/90  120/79 148/84  Pulse: 85  77 84  Temp: 98.8 F (37.1 C)     TempSrc: Oral     Resp: 14  15 14   Height:  6\' 2"  (1.88 m)    Weight:  81.647 kg (180 lb)    SpO2: 100%  98% 98%   Blood pressure 148/84, pulse 84, temperature 98.8 F (37.1 C), temperature source Oral, resp. rate 14, height 6\' 2"  (1.88 m), weight 81.647 kg (180 lb), SpO2 98.00%.  GEN:   patient lying in the stretcher in no acute distress; cooperative with exam. PSYCH:  alert and oriented x4; does not appear anxious or depressed; affect is appropriate. HEENT: Mucous membranes pink and anicteric; PERRLA; EOM intact; no cervical lymphadenopathy nor thyromegaly or carotid bruit; no JVD; There were no stridor. Neck is very supple. Breasts:: Not examined CHEST WALL: No tenderness CHEST: Normal respiration, clear to auscultation bilaterally.  HEART: Regular rate and rhythm.  There are no murmur, rub, or gallops.   BACK: No kyphosis or scoliosis; no CVA tenderness ABDOMEN: soft and non-tender; no masses, no organomegaly, normal abdominal bowel sounds; no pannus; no intertriginous candida. There is no rebound and no distention. Rectal Exam: Not done EXTREMITIES: No bone or joint deformity; age-appropriate arthropathy of the hands and knees; no edema; no ulcerations.  There is no calf tenderness. Genitalia: not examined PULSES: 2+ and symmetric SKIN: Normal hydration no rash or ulceration CNS: Cranial nerves 2-12 grossly intact no focal lateralizing neurologic deficit.  Speech is fluent; uvula elevated with phonation, facial symmetry and tongue midline. DTR are normal bilaterally, cerebella exam is intact, barbinski is negative and strengths are equaled bilaterally.  No sensory loss.   Labs on Admission:  Basic Metabolic Panel:  Lab 11/09/12 1610  NA 135  K 4.3  CL 97  CO2 24  GLUCOSE 85  BUN 11    CREATININE 0.85  CALCIUM 9.6  MG --  PHOS --   Liver Function Tests:  Lab 11/09/12 2353  AST 17  ALT 13  ALKPHOS 83  BILITOT 0.2*  PROT 8.6*  ALBUMIN 4.2   No results found for this basename: LIPASE:5,AMYLASE:5 in the last 168 hours No results found for this basename: AMMONIA:5 in the last 168 hours CBC:  Lab 11/09/12 2353  WBC 6.4  NEUTROABS --  HGB 12.9*  HCT 36.6*  MCV 88.2  PLT 385   Cardiac Enzymes:  Lab 11/10/12 0023  CKTOTAL --  CKMB --  CKMBINDEX --  TROPONINI <0.30    CBG: No results found for this basename: GLUCAP:5 in the last 168 hours   Radiological Exams on Admission: Dg Chest Portable 1 View  11/10/2012  *RADIOLOGY REPORT*  Clinical Data: Chest pain and seizures.  PORTABLE CHEST - 1 VIEW  Comparison: 03/16/2009 and chest CT from 04/06/2011  Findings: Single view of the chest was obtained.  Heart size is upper limits of normal.  The ascending thoracic aorta appears to be prominent.  The lungs are clear without  airspace disease or edema. Trachea is midline.  IMPRESSION: No acute chest findings.  The ascending thoracic aortic shadow is prominent and compatible with known enlargement or ectasia of the ascending thoracic aorta.   Original Report Authenticated By: Richarda Overlie, M.D.     EKG: Independently reviewed. Slight peaking of T waves in the precordial lead, but no definite ischemic changes.   Assessment/Plan Present on Admission:  . TOBACCO ABUSE . SEIZURE DISORDER . HYPERTENSION . HYPERLIPIDEMIA . CAD   PLAN: Given his known severe CAD and 4 cardiac stents, I will admit him to telemetry for r/out.  I will go ahead and start him on IV Heparin along with ASA.  For his seizure, will continue his phenobarb as it is on the low therapeutic goal.  He is stable, full code, and will be admitted to telemetry under [facility] service.  Other plans as per orders.  Code Status: FULL Unk Lightning, MD. [facility] Pager [phone] 7pm to  7am.  11/10/2012, 3:58 AM
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
## 2012-11-10 NOTE — Progress Notes (Signed)
ANTICOAGULATION CONSULT NOTE - Initial Consult  Pharmacy Consult for heparin Indication: chest pain/ACS  Allergies  Allergen Reactions  . Food Allergy Formula Other (See Comments)    Sage Causes seizures:    Patient Measurements: Height: 6\' 2"  (188 cm) Weight: 180 lb (81.647 kg) IBW/kg (Calculated) : 82.2   Vital Signs: Temp: 98.8 F (37.1 C) (01/20 2327) Temp src: Oral (01/20 2327) BP: 116/79 mmHg (01/21 0400) Pulse Rate: 77  (01/21 0400)  Labs:  Basename 11/10/12 0023 11/09/12 2353  HGB -- 12.9*  HCT -- 36.6*  PLT -- 385  APTT -- --  LABPROT -- --  INR -- --  HEPARINUNFRC -- --  CREATININE -- 0.85  CKTOTAL -- --  CKMB -- --  TROPONINI <0.30 --    Estimated Creatinine Clearance: 120 ml/min (by C-G formula based on Cr of 0.85).   Medical History: Past Medical History  Diagnosis Date  . CAD (coronary artery disease)   . HLD (hyperlipidemia)   . Seizure disorder   . Angina   . Seizures   . Hypertension   . Myocardial infarction   . TOBACCO ABUSE 02/22/2009    Medications:  Prescriptions prior to admission  Medication Sig Dispense Refill  . aspirin 81 MG tablet Take 81 mg by mouth daily.        . naproxen sodium (ANAPROX) 220 MG tablet Take 440 mg by mouth 2 (two) times daily as needed. Dental pain      . nitroGLYCERIN (NITROSTAT) 0.4 MG SL tablet Place 0.4 mg under the tongue every 5 (five) minutes as needed.        [name] PHENObarbital (LUMINAL) 60 MG tablet Take 2 tablets (120 mg total) by mouth daily.  60 tablet  0   Scheduled:    . aspirin  81 mg Oral Daily  . docusate sodium  100 mg Oral BID  . heparin  4,000 Units Intravenous Once  . [COMPLETED] nitroGLYCERIN  0.5 inch Topical Once  . PHENObarbital  120 mg Oral Daily  . sodium chloride  3 mL Intravenous Q12H  . sodium chloride  3 mL Intravenous Q12H  . [DISCONTINUED] aspirin  81 mg Oral Daily    Assessment: 52yo male with h/o CAD c/o several Sz over one day, then developed radiating CP after  post-ictal period, concerning given noncompliance with Plavix; initial troponin negative; to begin heparin.  Goal of Therapy:  Heparin level 0.3-0.7 units/ml Monitor platelets by anticoagulation protocol: Yes   Plan:  Will give heparin 4000 units IV bolus x1 followed by gtt at 1000 units/hr and monitor heparin levels and CBC.  Colleen Can PharmD BCPS 11/10/2012,5:27 AM

## 2012-11-10 NOTE — Progress Notes (Addendum)
Pt resting in bed and stated he had CP radiating to his left arm. Pt stated his pain was a 8/10. VS stable. Np on call made aware. New order received. Morphine and nitro paste given to pt which brought his pain down to a 2.
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
## 2012-12-04 ENCOUNTER — Emergency Department (HOSPITAL_COMMUNITY): Payer: Self-pay

## 2012-12-04 ENCOUNTER — Emergency Department (HOSPITAL_COMMUNITY)
Admission: EM | Admit: 2012-12-04 | Discharge: 2012-12-04 | Disposition: A | Discharge status: Home / Self Care | Payer: Self-pay | Attending: Emergency Medicine | Admitting: Emergency Medicine

## 2012-12-04 ENCOUNTER — Encounter (HOSPITAL_COMMUNITY): Payer: Self-pay | Admitting: *Deleted

## 2012-12-04 DIAGNOSIS — Z862 Personal history of diseases of the blood and blood-forming organs and certain disorders involving the immune mechanism: Secondary | ICD-10-CM | POA: Insufficient documentation

## 2012-12-04 DIAGNOSIS — Z9861 Coronary angioplasty status: Secondary | ICD-10-CM | POA: Insufficient documentation

## 2012-12-04 DIAGNOSIS — F17200 Nicotine dependence, unspecified, uncomplicated: Secondary | ICD-10-CM | POA: Insufficient documentation

## 2012-12-04 DIAGNOSIS — Z7982 Long term (current) use of aspirin: Secondary | ICD-10-CM | POA: Insufficient documentation

## 2012-12-04 DIAGNOSIS — I252 Old myocardial infarction: Secondary | ICD-10-CM | POA: Insufficient documentation

## 2012-12-04 DIAGNOSIS — I10 Essential (primary) hypertension: Secondary | ICD-10-CM | POA: Insufficient documentation

## 2012-12-04 DIAGNOSIS — G40909 Epilepsy, unspecified, not intractable, without status epilepticus: Secondary | ICD-10-CM | POA: Insufficient documentation

## 2012-12-04 DIAGNOSIS — R0789 Other chest pain: Secondary | ICD-10-CM | POA: Insufficient documentation

## 2012-12-04 DIAGNOSIS — Z79899 Other long term (current) drug therapy: Secondary | ICD-10-CM | POA: Insufficient documentation

## 2012-12-04 DIAGNOSIS — I2510 Atherosclerotic heart disease of native coronary artery without angina pectoris: Secondary | ICD-10-CM | POA: Insufficient documentation

## 2012-12-04 DIAGNOSIS — Z8639 Personal history of other endocrine, nutritional and metabolic disease: Secondary | ICD-10-CM | POA: Insufficient documentation

## 2012-12-04 DIAGNOSIS — I209 Angina pectoris, unspecified: Secondary | ICD-10-CM | POA: Insufficient documentation

## 2012-12-04 LAB — CBC WITH DIFFERENTIAL/PLATELET
Hemoglobin: 12.1 g/dL — ABNORMAL LOW (ref 13.0–17.0)
MCH: 30.1 pg (ref 26.0–34.0)
MCHC: 33.8 g/dL (ref 30.0–36.0)
MCV: 89.1 fL (ref 78.0–100.0)
Platelets: 365 10*3/uL (ref 150–400)

## 2012-12-04 LAB — COMPREHENSIVE METABOLIC PANEL
ALT: 11 U/L (ref 0–53)
Calcium: 9.2 mg/dL (ref 8.4–10.5)
Creatinine, Ser: 1.01 mg/dL (ref 0.50–1.35)
GFR calc Af Amer: >90 mL/min (ref >90)
Glucose, Bld: 94 mg/dL (ref 70–99)
Sodium: 136 mEq/L (ref 135–145)
Total Protein: 7.7 g/dL (ref 6.0–8.3)

## 2012-12-04 LAB — TROPONIN I
Troponin I: <0.3 ng/mL (ref <0.30)
Troponin I: <0.3 ng/mL (ref <0.30)

## 2012-12-04 NOTE — ED Notes (Signed)
Per EMS pts mother states pt had a total of 4 seizures this am, last one about an hour ago, pt states has been taking seizure medication, states usually has 20 seizures a week. BP 102/74, HR 70, RR 16, CBG 80.

## 2012-12-04 NOTE — ED Provider Notes (Signed)
History  This chart was scribed for Loren Racer, MD by Bennett Scrape, ED Scribe. This patient was seen in room WA13/WA13 and the patient's care was started at 7:51 AM  CSN: [id number]  Arrival date & time 12/04/12  4098   First MD Initiated Contact with Patient 12/04/12 0751      Chief Complaint  Patient presents with  . Seizures    Patient is a 52 y.o. male presenting with seizures. The history is provided by the patient. No language interpreter was used.  Seizures Seizure activity on arrival: no   Seizure type:  Myoclonic Preceding symptoms: no headache   Initial focality:  Diffuse Episode characteristics: generalized shaking   Episode characteristics: no incontinence and no tongue biting   Return to baseline: yes   Timing:  Unable to specify Progression:  Resolved Recent head injury:  No recent head injuries History of seizures: yes     [name] is a 52 y.o. male with a h/o seizures brought in by ambulance, who presents to the Emergency Department complaining of 4 witnessed seizures by mother, the last one occuring one hour PTA. Pt states that he feels back to baseline currently. He reports that he remembers "jerking" but nothing else which is at baseline for his seizures. He states that he typically has 20 seizures a week even with his medications. He casually reports a very mild left-sided CP that started after the seizures. He denies that this is a recurrent post-seizure complaint. He states that he a h/o MI and prior stent placement and he denies similarities to his prior anginal pain. He denies urinary or bowel incontinence and tongue biting as associated symptoms. He is on 60 mg phenobarbital (2 tablets in the morning and 1.5 tablets at night) which was increased 2 weeks ago after a visit to the ED for the same. He states that he was given a new neurologist, Dr. Thana Farr, and has been taking his medications as prescribed. He also has a h/o CAD, HLD and HTN. He  is a current everyday smoker and occasional alcohol user.  Past Medical History  Diagnosis Date  . CAD (coronary artery disease)   . HLD (hyperlipidemia)   . Seizure disorder   . Angina   . Seizures   . Hypertension   . Myocardial infarction   . TOBACCO ABUSE 02/22/2009    Past Surgical History  Procedure Laterality Date  . Carotid stent  jan. 2009, apr. 2010    proximal left circumflex artery  . Cardiac catheterization      History reviewed. No pertinent family history.  History  Substance Use Topics  . Smoking status: Smoker, Current Status Unknown -- 1.00 packs/day for 35 years    Types: Cigarettes, Cigars  . Smokeless tobacco: Not on file  . Alcohol Use: No     Comment: occasionally      Review of Systems  Constitutional: Negative for fever and chills.  Cardiovascular: Positive for chest pain.  Gastrointestinal: Negative for nausea and vomiting.  Neurological: Positive for seizures. Negative for headaches.  Psychiatric/Behavioral: Negative for confusion.  All other systems reviewed and are negative.    Allergies  Food allergy formula  Home Medications   Current Outpatient Rx  Name  Route  Sig  Dispense  Refill  . aspirin 81 MG tablet   Oral   Take 81 mg by mouth daily.          . naproxen sodium (ANAPROX) 220 MG tablet  Oral   Take 440 mg by mouth 2 (two) times daily as needed. Dental pain         . PHENObarbital (LUMINAL) 60 MG tablet   Oral   Take 60-90 mg by mouth every morning. Pt takes 2 tablets in the morning. At bedtime pt takes 1.5 tabs         . nitroGLYCERIN (NITROSTAT) 0.4 MG SL tablet   Sublingual   Place 1 tablet (0.4 mg total) under the tongue every 5 (five) minutes as needed.   30 tablet   0   . PHENobarbital (LUMINAL) 97.2 MG tablet   Oral   Take 1 tablet (97.2 mg total) by mouth every evening.   30 tablet   1     Triage Vitals: BP 111/61  Pulse 87  Temp(Src) 98 F (36.7 C) (Oral)  Resp 16  SpO2 92%  Physical  Exam  Nursing note and vitals reviewed. Constitutional: He is oriented to person, place, and time. He appears well-developed and well-nourished. No distress.  HENT:  Head: Normocephalic and atraumatic.  Mouth/Throat: Oropharynx is clear and moist.  No head trauma, no tongue trauma  Eyes: Conjunctivae and EOM are normal. Pupils are equal, round, and reactive to light.  Neck: Neck supple. No tracheal deviation present.  No cervical spine tenderness  Cardiovascular: Normal rate and regular rhythm.   No murmur heard. Pulmonary/Chest: Effort normal and breath sounds normal. No respiratory distress. He exhibits no tenderness (No reproducible chest tenderness upon palpation).  Abdominal: Soft. There is no tenderness.  Musculoskeletal: Normal range of motion.  No spinal tenderness  Neurological: He is alert and oriented to person, place, and time.  5/5 strength throughout, sensation intact  Skin: Skin is warm and dry.  Psychiatric: He has a normal mood and affect. His behavior is normal.    ED Course  Procedures (including critical care time)  DIAGNOSTIC STUDIES: Oxygen Saturation is 97% on room air, adequate by my interpretation.    COORDINATION OF CARE: 8:31 AM-Discussed treatment plan which includes CXR, CBC panel, troponin and phenobarbital level check with pt at bedside and pt agreed to plan.   11:12 AM-Pt rechecked and feels improved. He denies pain currently and is asking for food. Informed pt of lab and radiology results. Will get pt food and reorder troponin. If troponin is negative, will discharge pt home.  Labs Reviewed  CBC WITH DIFFERENTIAL - Abnormal; Notable for the following:    RBC 4.02 (*)    Hemoglobin 12.1 (*)    HCT 35.8 (*)    All other components within normal limits  COMPREHENSIVE METABOLIC PANEL - Abnormal; Notable for the following:    Total Bilirubin 0.1 (*)    GFR calc non Af Amer 85 (*)    All other components within normal limits  PHENOBARBITAL LEVEL   TROPONIN I  TROPONIN I   Dg Chest 2 View  12/04/2012  *RADIOLOGY REPORT*  Clinical Data: Seizures.  Weakness and cough.  CHEST - 2 VIEW  Comparison: Single view of the chest 11/09/2012.  CT chest 04/06/2011.  Findings: There is cardiomegaly without edema.  Lungs are clear with emphysematous disease identified.  No pneumothorax or pleural effusion.  IMPRESSION: Cardiomegaly and emphysema without acute disease.   Original Report Authenticated By: Holley Dexter, M.D.      1. Seizure disorder   2. Atypical chest pain       MDM  I personally performed the services described in this documentation, which was scribed  in my presence. The recorded information has been reviewed and is accurate.       Loren Racer, MD 12/15/12 [phone]

## 2012-12-04 NOTE — ED Notes (Signed)
[id number]<AV> Expected date:12/04/12<BR> Expected time: 7:34 AM<BR> Means of arrival:Ambulance<BR> Comments:<BR> Seizures; hx same

## 2012-12-04 NOTE — ED Notes (Signed)
Pt states had seizures today, pt a/o x 4, denies incontinence during seizures, pt a/o x 4, neuro intact, pt states usually has 20 seizures a week, pt states he takes medication like scheduled.

## 2012-12-04 NOTE — ED Provider Notes (Signed)
History     CSN: [id number]  Arrival date & time 12/04/12  4098   First MD Initiated Contact with Patient 12/04/12 [phone]      Chief Complaint  Patient presents with  . Seizures    (Consider location/radiation/quality/duration/timing/severity/associated sxs/prior treatment) HPI  Past Medical History  Diagnosis Date  . CAD (coronary artery disease)   . HLD (hyperlipidemia)   . Seizure disorder   . Angina   . Seizures   . Hypertension   . Myocardial infarction   . TOBACCO ABUSE 02/22/2009    Past Surgical History  Procedure Laterality Date  . Carotid stent  jan. 2009, apr. 2010    proximal left circumflex artery  . Cardiac catheterization      History reviewed. No pertinent family history.  History  Substance Use Topics  . Smoking status: Smoker, Current Status Unknown -- 1.00 packs/day for 35 years    Types: Cigarettes, Cigars  . Smokeless tobacco: Not on file  . Alcohol Use: No     Comment: occasionally      Review of Systems  Allergies  Food allergy formula  Home Medications   Current Outpatient Rx  Name  Route  Sig  Dispense  Refill  . aspirin 81 MG tablet   Oral   Take 81 mg by mouth daily.          . naproxen sodium (ANAPROX) 220 MG tablet   Oral   Take 440 mg by mouth 2 (two) times daily as needed. Dental pain         . PHENObarbital (LUMINAL) 60 MG tablet   Oral   Take 60-90 mg by mouth every morning. Pt takes 2 tablets in the morning. At bedtime pt takes 1.5 tabs         . nitroGLYCERIN (NITROSTAT) 0.4 MG SL tablet   Sublingual   Place 1 tablet (0.4 mg total) under the tongue every 5 (five) minutes as needed.   30 tablet   0   . PHENobarbital (LUMINAL) 97.2 MG tablet   Oral   Take 1 tablet (97.2 mg total) by mouth every evening.   30 tablet   1     BP 111/61  Pulse 87  Temp(Src) 98 F (36.7 C) (Oral)  Resp 16  SpO2 92%  Physical Exam  ED Course  Procedures (including critical care time)  Labs Reviewed  CBC WITH  DIFFERENTIAL - Abnormal; Notable for the following:    RBC 4.02 (*)    Hemoglobin 12.1 (*)    HCT 35.8 (*)    All other components within normal limits  COMPREHENSIVE METABOLIC PANEL - Abnormal; Notable for the following:    Total Bilirubin 0.1 (*)    GFR calc non Af Amer 85 (*)    All other components within normal limits  PHENOBARBITAL LEVEL  TROPONIN I  TROPONIN I   Dg Chest 2 View  12/04/2012  *RADIOLOGY REPORT*  Clinical Data: Seizures.  Weakness and cough.  CHEST - 2 VIEW  Comparison: Single view of the chest 11/09/2012.  CT chest 04/06/2011.  Findings: There is cardiomegaly without edema.  Lungs are clear with emphysematous disease identified.  No pneumothorax or pleural effusion.  IMPRESSION: Cardiomegaly and emphysema without acute disease.   Original Report Authenticated By: Holley Dexter, M.D.      1. Seizure disorder   2. Atypical chest pain      Date: 12/04/2012  Rate: 87  Rhythm: normal sinus rhythm  QRS Axis: normal  Intervals: normal  ST/T Wave abnormalities: normal  Conduction Disutrbances:none  Narrative Interpretation:   Old EKG Reviewed: unchanged    MDM  I personally performed the services described in this documentation, which was scribed in my presence. The recorded information has been reviewed and is accurate.  Pt CP is atypical and states it is not similar to prev CP related with angina. Symptoms are now resolved. Normal EKG, Trop x 2 negative. Encouraged to f/u with his cardiologist.   No seizures in ED. Phenobarb WNL. Advised to f/u with Neurologist. Return for worsening symptoms        Loren Racer, MD 12/04/12 1232

## 2013-05-06 ENCOUNTER — Ambulatory Visit
Admission: RE | Admit: 2013-05-06 | Discharge: 2013-05-06 | Disposition: A | Discharge status: Home / Self Care | Payer: Medicaid Other | Source: Ambulatory Visit | Attending: Family Medicine | Admitting: Family Medicine

## 2013-05-06 ENCOUNTER — Other Ambulatory Visit: Payer: Self-pay | Admitting: Family Medicine

## 2013-05-06 DIAGNOSIS — R569 Unspecified convulsions: Secondary | ICD-10-CM

## 2013-05-06 DIAGNOSIS — I6789 Other cerebrovascular disease: Secondary | ICD-10-CM

## 2013-05-31 ENCOUNTER — Emergency Department (HOSPITAL_COMMUNITY)
Admission: EM | Admit: 2013-05-31 | Discharge: 2013-06-01 | Disposition: A | Discharge status: Home / Self Care | Payer: Medicaid Other | Attending: Emergency Medicine | Admitting: Emergency Medicine

## 2013-05-31 ENCOUNTER — Other Ambulatory Visit: Payer: Self-pay

## 2013-05-31 DIAGNOSIS — Z9889 Other specified postprocedural states: Secondary | ICD-10-CM | POA: Insufficient documentation

## 2013-05-31 DIAGNOSIS — F17200 Nicotine dependence, unspecified, uncomplicated: Secondary | ICD-10-CM | POA: Insufficient documentation

## 2013-05-31 DIAGNOSIS — R0789 Other chest pain: Secondary | ICD-10-CM | POA: Insufficient documentation

## 2013-05-31 DIAGNOSIS — R110 Nausea: Secondary | ICD-10-CM | POA: Insufficient documentation

## 2013-05-31 DIAGNOSIS — R569 Unspecified convulsions: Secondary | ICD-10-CM

## 2013-05-31 DIAGNOSIS — I2510 Atherosclerotic heart disease of native coronary artery without angina pectoris: Secondary | ICD-10-CM | POA: Insufficient documentation

## 2013-05-31 DIAGNOSIS — Z7982 Long term (current) use of aspirin: Secondary | ICD-10-CM | POA: Insufficient documentation

## 2013-05-31 DIAGNOSIS — Z9861 Coronary angioplasty status: Secondary | ICD-10-CM | POA: Insufficient documentation

## 2013-05-31 DIAGNOSIS — I10 Essential (primary) hypertension: Secondary | ICD-10-CM | POA: Insufficient documentation

## 2013-05-31 DIAGNOSIS — E785 Hyperlipidemia, unspecified: Secondary | ICD-10-CM | POA: Insufficient documentation

## 2013-05-31 DIAGNOSIS — G40909 Epilepsy, unspecified, not intractable, without status epilepticus: Secondary | ICD-10-CM | POA: Insufficient documentation

## 2013-05-31 DIAGNOSIS — I252 Old myocardial infarction: Secondary | ICD-10-CM | POA: Insufficient documentation

## 2013-05-31 DIAGNOSIS — Z79899 Other long term (current) drug therapy: Secondary | ICD-10-CM | POA: Insufficient documentation

## 2013-05-31 DIAGNOSIS — R51 Headache: Secondary | ICD-10-CM | POA: Insufficient documentation

## 2013-05-31 NOTE — ED Notes (Signed)
Patient is alert and oriented x3.  His mother states that he had a "full blown seizure" this evening.   No LOC or urinary incontinence noted by EMS

## 2013-06-01 ENCOUNTER — Emergency Department (HOSPITAL_COMMUNITY): Payer: Medicaid Other

## 2013-06-01 LAB — COMPREHENSIVE METABOLIC PANEL
ALT: 25 U/L (ref 0–53)
AST: 14 U/L (ref 0–37)
Albumin: 3.9 g/dL (ref 3.5–5.2)
CO2: 24 mEq/L (ref 19–32)
Chloride: 99 mEq/L (ref 96–112)
GFR calc non Af Amer: >90 mL/min (ref >90)
Potassium: 3.9 mEq/L (ref 3.5–5.1)
Sodium: 134 mEq/L — ABNORMAL LOW (ref 135–145)
Total Bilirubin: 0.1 mg/dL — ABNORMAL LOW (ref 0.3–1.2)

## 2013-06-01 LAB — CBC WITH DIFFERENTIAL/PLATELET
Basophils Absolute: 0 10*3/uL (ref 0.0–0.1)
Basophils Relative: 0 % (ref 0–1)
HCT: 34.9 % — ABNORMAL LOW (ref 39.0–52.0)
Lymphocytes Relative: 25 % (ref 12–46)
MCHC: 35.2 g/dL (ref 30.0–36.0)
Monocytes Absolute: 0.7 10*3/uL (ref 0.1–1.0)
Neutro Abs: 4.5 10*3/uL (ref 1.7–7.7)
Neutrophils Relative %: 62 % (ref 43–77)
Platelets: 358 10*3/uL (ref 150–400)
RDW: 16.1 % — ABNORMAL HIGH (ref 11.5–15.5)
WBC: 7.3 10*3/uL (ref 4.0–10.5)

## 2013-06-01 LAB — POCT I-STAT TROPONIN I

## 2013-06-01 MED ORDER — SODIUM CHLORIDE 0.9 % IV BOLUS (SEPSIS)
1000.0000 mL | Freq: Once | INTRAVENOUS | Status: AC
  Administered 2013-06-01: 1000 mL via INTRAVENOUS

## 2013-06-01 MED ORDER — NITROGLYCERIN 0.4 MG SL SUBL
0.4000 mg | SUBLINGUAL_TABLET | SUBLINGUAL | Status: DC | PRN
  Administered 2013-06-01: 0.4 mg via SUBLINGUAL

## 2013-06-01 NOTE — ED Notes (Signed)
Pt c/o rt sided chest pain describes as a pulling sensation to his rt chest; pt denies SHOB, nausea or vomiting; pt states "it's nothing that a cup of coffee won't fix"

## 2013-06-01 NOTE — ED Provider Notes (Signed)
Francely Craw S 2:00AM Pt discussed in sign out.  Pt with seizure disorder.  Last seizure around 10PM.  Also complaining of some chest discomforts and pain.  Second troponin pending.  Plan if negative to send home to follow up with pcp.  4:15AM Second troponin negative.  Pt stable for d/c home.  Angus Seller, PA-C 06/01/13 [phone]

## 2013-06-01 NOTE — ED Provider Notes (Signed)
Medical screening examination/treatment/procedure(s) were performed by non-physician practitioner and as supervising physician I was immediately available for consultation/collaboration.   Jannie Doyle, MD 06/01/13 0436 

## 2013-06-01 NOTE — ED Provider Notes (Signed)
CSN: [id number]     Arrival date & time 05/31/13  2304 History     First MD Initiated Contact with Patient 05/31/13 2352     Chief Complaint  Patient presents with  . Seizures   (Consider location/radiation/quality/duration/timing/severity/associated sxs/prior Treatment) The history is provided by the patient and a parent. No language interpreter was used.  [name] is a 52 y/o M with PMHx of seizures, CAD, angina, HTN, and MI presenting to the ED, with mother, after two seizure episodes that occurred today. Mother reported that patient normally has grand-mal seizures, which is how he presented today - patient stares, makes a noise, stomps one foot, sits still than has whole body jerks. Mother reported that patient had one episode of seizures at 11:00AM this morning and then again a little after 11:00PM, reported that each seizure lasted approximately 5 minutes - as per mother's report. Mother reported that patient usually has a short lasting post-ictal period of mild confusion, which lasted today for approximately 3 minutes and difficulty speaking - patient reported that this normal. Stated that he has been experiencing headache and mild nausea which occurs after most seizures. Mother reported that patient can get up to 9 seizures per day. Patient reported that he has been experiencing chest pain today described as a sharp pain to the right side, described as a grabbing sensation, reported that the pain increased during seizure episode. Patient reported that the pain the he was experiencing was very similar to that of the MI he had once before. Reported that he took his anti-seizure medications today - patient reported that he is on phenobarbitol. Patient reported that he was seen by his PCP Dr. Bruna Potter approximately 2 weeks ago and reported that everything was okay. Denied shortness of  Breath, difficulty breathing, neck pain, neck stiffness, numbness, tingling, nausea, vomiting, diarrhea,  abdominal pain, confusion.  PCP Dr. Bruna Potter   Past Medical History  Diagnosis Date  . CAD (coronary artery disease)   . HLD (hyperlipidemia)   . Seizure disorder   . Angina   . Seizures   . Hypertension   . Myocardial infarction   . TOBACCO ABUSE 02/22/2009   Past Surgical History  Procedure Laterality Date  . Carotid stent  jan. 2009, apr. 2010    proximal left circumflex artery  . Cardiac catheterization     No family history on file. History  Substance Use Topics  . Smoking status: Smoker, Current Status Unknown -- 1.00 packs/day for 35 years    Types: Cigarettes, Cigars  . Smokeless tobacco: Not on file  . Alcohol Use: No     Comment: occasionally    Review of Systems  Constitutional: Negative for fever and chills.  HENT: Negative for sore throat, trouble swallowing, neck pain and neck stiffness.   Eyes: Negative for visual disturbance.  Respiratory: Negative for chest tightness and shortness of breath.   Cardiovascular: Positive for chest pain.  Gastrointestinal: Negative for nausea, vomiting, abdominal pain and diarrhea.  Musculoskeletal: Negative for arthralgias.  Neurological: Positive for headaches. Negative for dizziness, weakness and numbness.  All other systems reviewed and are negative.    Allergies  Food allergy formula  Home Medications   Current Outpatient Rx  Name  Route  Sig  Dispense  Refill  . aspirin 81 MG tablet   Oral   Take 162 mg by mouth daily.          [name] atorvastatin (LIPITOR) 20 MG tablet   Oral  Take 20 mg by mouth daily.         . naproxen sodium (ANAPROX) 220 MG tablet   Oral   Take 440 mg by mouth 2 (two) times daily as needed. Dental pain         . nitroGLYCERIN (NITROSTAT) 0.4 MG SL tablet   Sublingual   Place 1 tablet (0.4 mg total) under the tongue every 5 (five) minutes as needed.   30 tablet   0   . PHENObarbital (LUMINAL) 60 MG tablet   Oral   Take 60 mg by mouth 2 (two) times daily.          BP  125/82  Pulse 93  Temp(Src) 98.3 F (36.8 C) (Oral)  Resp 15  Ht 6\' 2"  (1.88 m)  Wt 195 lb (88.451 kg)  BMI 25.03 kg/m2  SpO2 95% Physical Exam  Nursing note and vitals reviewed. Constitutional: He is oriented to person, place, and time. He appears well-developed and well-nourished. No distress.  HENT:  Head: Normocephalic and atraumatic.  Eyes: Conjunctivae and EOM are normal. Pupils are equal, round, and reactive to light. Right eye exhibits no discharge. Left eye exhibits no discharge.  Negative nystagmus  Neck: Normal range of motion. Neck supple.  Negative neck stiffness Negative nuchal rigidity Negative lymphadenopathy Negative pain upon palpation to the cervical spine  Cardiovascular: Normal rate, regular rhythm and normal heart sounds.  Exam reveals no friction rub.   No murmur heard. Pulses:      Radial pulses are 2+ on the right side, and 2+ on the left side.       Dorsalis pedis pulses are 2+ on the right side, and 2+ on the left side.  Pulmonary/Chest: Effort normal and breath sounds normal. No respiratory distress. He has no wheezes. He has no rales. He exhibits no tenderness.  Pain not reproducible upon palpation to the chest wall   Musculoskeletal: Normal range of motion.  Lymphadenopathy:    He has no cervical adenopathy.  Neurological: He is alert and oriented to person, place, and time. No cranial nerve deficit. He exhibits normal muscle tone. Coordination normal.  Cranial nerves III-XII grossly intact  Sensation intact with differentiation to sharp and dull touch to upper and lower extremities bilaterally Strength 5+/5+ with resistance to BLE and BUE  Able to touch finger to nose  Skin: Skin is warm and dry. No rash noted. He is not diaphoretic. No erythema.  Psychiatric: He has a normal mood and affect. His behavior is normal. Thought content normal.    ED Course   Procedures (including critical care time)   Date: 06/01/2013  Rate: 91  Rhythm: normal  sinus rhythm  QRS Axis: normal  Intervals: normal  ST/T Wave abnormalities: normal  Conduction Disutrbances:none  Narrative Interpretation:   Old EKG Reviewed: unchanged   Labs Reviewed - No data to display No results found. No diagnosis found.  MDM  Patient presenting to the ED after two seizure episodes that occurred today. Patient has a history of seizures, grand-mal. Alert and oriented. Patient responds to questions appropriately, follows commands. Sensation intact. Strength intact. Full ROM to upper and lower extremities. Lungs clear to auscultation. Negative pain upon palpation to the chest wall - chest pain non-reproducible. EKG negative ischemic changes noted. First set of troponins ordered. CBC negative WBC elevation, negative findings noted. CMP mildly low sodium (134), negative findings. Phenobarbital level mildly low (12.4). Chest xray cardiomegaly noted, negative acute abnormalities noted. No seizure activity noted while  in the ED setting. Patient has history of angina, atypical chest pain and seizures - seen in ED numerous times for seizure activity. Second set of troponins ordered for 3:50-4:00AM. Discussed case with Phill Mutter. Dammen, PA-C, transfer of care to [employer]. Dammen at end of shift.         Raymon Mutton, PA-C 06/01/13 0345

## 2013-06-01 NOTE — ED Provider Notes (Signed)
Medical screening examination/treatment/procedure(s) were performed by non-physician practitioner and as supervising physician I was immediately available for consultation/collaboration.   Tome Wilson, MD 06/01/13 0352 

## 2013-07-08 ENCOUNTER — Ambulatory Visit: Payer: Medicaid Other | Admitting: Cardiovascular Disease

## 2013-07-20 ENCOUNTER — Encounter: Payer: Self-pay | Admitting: Internal Medicine

## 2013-07-23 ENCOUNTER — Encounter: Payer: Self-pay | Admitting: Gastroenterology

## 2013-08-02 ENCOUNTER — Ambulatory Visit: Payer: Medicaid Other | Admitting: Cardiovascular Disease

## 2013-08-24 ENCOUNTER — Encounter: Payer: Self-pay | Admitting: Cardiovascular Disease

## 2013-08-25 ENCOUNTER — Encounter: Payer: Self-pay | Admitting: Gastroenterology

## 2013-08-27 ENCOUNTER — Other Ambulatory Visit (INDEPENDENT_AMBULATORY_CARE_PROVIDER_SITE_OTHER): Payer: Medicaid Other

## 2013-08-27 ENCOUNTER — Encounter: Payer: Self-pay | Admitting: Gastroenterology

## 2013-08-27 ENCOUNTER — Ambulatory Visit (INDEPENDENT_AMBULATORY_CARE_PROVIDER_SITE_OTHER): Payer: Medicaid Other | Admitting: Gastroenterology

## 2013-08-27 VITALS — BP 122/72 | HR 80 | Ht 73.0 in | Wt 200.4 lb

## 2013-08-27 DIAGNOSIS — N509 Disorder of male genital organs, unspecified: Secondary | ICD-10-CM

## 2013-08-27 DIAGNOSIS — Z1211 Encounter for screening for malignant neoplasm of colon: Secondary | ICD-10-CM

## 2013-08-27 DIAGNOSIS — I2510 Atherosclerotic heart disease of native coronary artery without angina pectoris: Secondary | ICD-10-CM

## 2013-08-27 DIAGNOSIS — N50811 Right testicular pain: Secondary | ICD-10-CM

## 2013-08-27 LAB — URINALYSIS
Hgb urine dipstick: NEGATIVE
Nitrite: NEGATIVE
Specific Gravity, Urine: 1.015 (ref 1.000–1.030)
Total Protein, Urine: NEGATIVE
Urine Glucose: NEGATIVE
Urobilinogen, UA: 0.2 (ref 0.0–1.0)

## 2013-08-27 NOTE — Assessment & Plan Note (Signed)
Plan urinalysis

## 2013-08-27 NOTE — Assessment & Plan Note (Signed)
The patient's cardiac issues appear stable.  Plan screening colonoscopy.

## 2013-08-27 NOTE — Progress Notes (Signed)
History of Present Illness: 52 year old Afro-American male with history of coronary artery disease, seizure disorder, hypertension, status post MI and clotting disorder referred for screening colonoscopy.  He has no GI complaints including change of bowel habits, abdominal pain, melena or hematochezia.  He has a prescription for Plavix but has not been taking the medicine recently.    Past Medical History  Diagnosis Date  . CAD (coronary artery disease)   . HLD (hyperlipidemia)   . Seizure disorder   . Angina   . Hypertension   . Myocardial infarction   . TOBACCO ABUSE 02/22/2009  . Clotting disorder    Past Surgical History  Procedure Laterality Date  . Carotid stent  jan. 2009, apr. 2010    proximal left circumflex artery  . Cardiac catheterization     family history is not on file. Current Outpatient Prescriptions  Medication Sig Dispense Refill  . AMBULATORY NON FORMULARY MEDICATION Prosvent prostate health Take 1 softgel once daily      . aspirin 81 MG tablet Take 162 mg by mouth daily.       . nitroGLYCERIN (NITROSTAT) 0.4 MG SL tablet Place 1 tablet (0.4 mg total) under the tongue every 5 (five) minutes as needed.  30 tablet  0  . Omega-3 Fatty Acids (FISH OIL PO) Take 1 capsule by mouth daily.      [name] PHENObarbital (LUMINAL) 30 MG tablet Take 60 mg by mouth 2 (two) times daily.      . pravastatin (PRAVACHOL) 20 MG tablet Take 20 mg by mouth daily.       No current facility-administered medications for this visit.   Allergies as of 08/27/2013 - Review Complete 08/27/2013  Allergen Reaction Noted  . Food allergy formula Other (See Comments) 10/25/2011    reports that he has been smoking Cigars.  He has never used smokeless tobacco. He reports that he does not drink alcohol or use illicit drugs.     Review of Systems: He complains of intermittent right testicular pain.  He describes what sounds like sediment in his urine Pertinent positive and negative review of systems  were noted in the above HPI section. All other review of systems were otherwise negative.  Vital signs were reviewed in today's medical record Physical Exam: General: Well developed , well nourished, no acute distress Skin: anicteric Head: Normocephalic and atraumatic Eyes:  sclerae anicteric, EOMI Ears: Normal auditory acuity Mouth: No deformity or lesions Neck: Supple, no masses or thyromegaly Lungs: Clear throughout to auscultation Heart: Regular rate and rhythm; no murmurs, rubs or bruits Abdomen: Soft, non tender and non distended. No masses, hepatosplenomegaly or hernias noted. Normal Bowel sounds Rectal:deferred Musculoskeletal: Symmetrical with no gross deformities  Skin: No lesions on visible extremities Pulses:  Normal pulses noted Extremities: No clubbing, cyanosis, edema or deformities noted Neurological: Alert oriented x 4, grossly nonfocal Cervical Nodes:  No significant cervical adenopathy Inguinal Nodes: No significant inguinal adenopathy Psychological:  Alert and cooperative. Normal mood and affect

## 2013-08-27 NOTE — Patient Instructions (Addendum)
You have been given a separate informational sheet regarding your tobacco use, the importance of quitting and local resources to help you quit. You will need to follow up with your doctor about Plavix Because you stopped it on your on  You have been scheduled for a colonoscopy with propofol. Please follow written instructions given to you at your visit today.  Please pick up your prep kit at the pharmacy within the next 1-3 days. If you use inhalers (even only as needed), please bring them with you on the day of your procedure. Your physician has requested that you go to www.startemmi.com and enter the access code given to you at your visit today. This web site gives a general overview about your procedure. However, you should still follow specific instructions given to you by our office regarding your preparation for the procedure.

## 2013-08-27 NOTE — Assessment & Plan Note (Signed)
I advised the patient to talk with his cardiologist or PCP regarding Plavix which she currently is not taking although this medicine was prescribed

## 2013-09-06 ENCOUNTER — Telehealth: Payer: Self-pay | Admitting: Gastroenterology

## 2013-09-06 NOTE — Telephone Encounter (Signed)
Patient called and I advised that Rx. Was phoned in to [facility]  Suprep use as directed per prep instructions.

## 2013-09-06 NOTE — Telephone Encounter (Signed)
Returned patient's call and phone was busy.  Attempted to call 3 times.  No pharmacy is in the computer to send prep to.  I will attempt to call again later.

## 2013-09-06 NOTE — Telephone Encounter (Signed)
Returned patient's call once again regarding him eating beans.  L/M for him to call back.

## 2013-09-07 ENCOUNTER — Ambulatory Visit (AMBULATORY_SURGERY_CENTER): Payer: Medicaid Other | Admitting: Gastroenterology

## 2013-09-07 ENCOUNTER — Encounter: Payer: Self-pay | Admitting: Gastroenterology

## 2013-09-07 VITALS — BP 121/81 | HR 66 | Temp 97.1°F | Resp 21 | Ht 73.0 in | Wt 200.0 lb

## 2013-09-07 DIAGNOSIS — D126 Benign neoplasm of colon, unspecified: Secondary | ICD-10-CM

## 2013-09-07 DIAGNOSIS — K5730 Diverticulosis of large intestine without perforation or abscess without bleeding: Secondary | ICD-10-CM

## 2013-09-07 DIAGNOSIS — Z1211 Encounter for screening for malignant neoplasm of colon: Secondary | ICD-10-CM

## 2013-09-07 MED ORDER — SODIUM CHLORIDE 0.9 % IV SOLN: 500.0000 mL | INTRAVENOUS | Status: DC

## 2013-09-07 NOTE — Patient Instructions (Signed)
YOU HAD AN ENDOSCOPIC PROCEDURE TODAY AT THE [facility]: Refer to the procedure report that was given to you for any specific questions about what was found during the examination.  If the procedure report does not answer your questions, please call your gastroenterologist to clarify.  If you requested that your care partner not be given the details of your procedure findings, then the procedure report has been included in a sealed envelope for you to review at your convenience later.  YOU SHOULD EXPECT: Some feelings of bloating in the abdomen. Passage of more gas than usual.  Walking can help get rid of the air that was put into your GI tract during the procedure and reduce the bloating. If you had a lower endoscopy (such as a colonoscopy or flexible sigmoidoscopy) you may notice spotting of blood in your stool or on the toilet paper. If you underwent a bowel prep for your procedure, then you may not have a normal bowel movement for a few days.  DIET: Your first meal following the procedure should be a light meal and then it is ok to progress to your normal diet.  A half-sandwich or bowl of soup is an example of a good first meal.  Heavy or fried foods are harder to digest and may make you feel nauseous or bloated.  Likewise meals heavy in dairy and vegetables can cause extra gas to form and this can also increase the bloating.  Drink plenty of fluids but you should avoid alcoholic beverages for 24 hours.  ACTIVITY: Your care partner should take you home directly after the procedure.  You should plan to take it easy, moving slowly for the rest of the day.  You can resume normal activity the day after the procedure however you should NOT DRIVE or use heavy machinery for 24 hours (because of the sedation medicines used during the test).    SYMPTOMS TO REPORT IMMEDIATELY: A gastroenterologist can be reached at any hour.  During normal business hours, 8:30 AM to 5:00 PM Monday through Friday,  call [phone].  After hours and on weekends, please call the GI answering service at [phone] who will take a message and have the physician on call contact you.   Following lower endoscopy (colonoscopy or flexible sigmoidoscopy):  Excessive amounts of blood in the stool  Significant tenderness or worsening of abdominal pains  Swelling of the abdomen that is new, acute  Fever of 100F or higher  FOLLOW UP: If any biopsies were taken you will be contacted by phone or by letter within the next 1-3 weeks.  Call your gastroenterologist if you have not heard about the biopsies in 3 weeks.  Our staff will call the home number listed on your records the next business day following your procedure to check on you and address any questions or concerns that you may have at that time regarding the information given to you following your procedure. This is a courtesy call and so if there is no answer at the home number and we have not heard from you through the emergency physician on call, we will assume that you have returned to your regular daily activities without incident.  SIGNATURES/CONFIDENTIALITY: You and/or your care partner have signed paperwork which will be entered into your electronic medical record.  These signatures attest to the fact that that the information above on your After Visit Summary has been reviewed and is understood.  Full responsibility of the confidentiality of this   discharge information lies with you and/or your care-partner.  Recommendations See procedure report  

## 2013-09-07 NOTE — Op Note (Signed)
[facility] [address] [place], [address]   COLONOSCOPY PROCEDURE REPORT  PATIENT: [name], [name]  MR#: [id number] BIRTHDATE: 25-May-1961 , 52  yrs. old GENDER: Male ENDOSCOPIST: Louis Meckel, MD REFERRED VH:QIONG Blount, M.D. PROCEDURE DATE:  09/07/2013 PROCEDURE:   Colonoscopy with cold biopsy polypectomy First Screening Colonoscopy - Avg.  risk and is 50 yrs.  old or older Yes.  Prior Negative Screening - Now for repeat screening. [name]  History of Adenoma - Now for follow-up colonoscopy & has been > or = to 3 yrs.  [name]  Polyps Removed Today? Yes. ASA CLASS:   Class III INDICATIONS:Average risk patient for colon cancer. MEDICATIONS: MAC sedation, administered by CRNA and propofol (Diprivan) 200mg  IV  DESCRIPTION OF PROCEDURE:   After the risks benefits and alternatives of the procedure were thoroughly explained, informed consent was obtained.  A digital rectal exam revealed no abnormalities of the rectum.   The [id number]  endoscope was introduced through the anus and advanced to the cecum, which was identified by both the appendix and ileocecal valve. No adverse events experienced.   The quality of the prep was Suprep fair  The instrument was then slowly withdrawn as the colon was fully examined.      COLON FINDINGS: A sessile polyp measuring 2 mm in size was found in the rectum.  A polypectomy was performed with cold forceps.   Mild diverticulosis was noted in the descending colon.   The colon mucosa was otherwise normal.  Retroflexed views revealed no abnormalities. The time to cecum=3 minutes 25 seconds.  Withdrawal time=8 minutes 58 seconds.  The scope was withdrawn and the procedure completed. COMPLICATIONS: There were no complications.  ENDOSCOPIC IMPRESSION: 1.   Sessile polyp measuring 2 mm in size was found in the rectum; polypectomy was performed with cold forceps 2.   Mild diverticulosis was noted in the descending  colon 3.   The colon mucosa was otherwise normal  RECOMMENDATIONS: If the polyp(s) removed today are proven to be adenomatous (pre-cancerous) polyps, you will need a repeat colonoscopy in 5 years.  Otherwise you should continue to follow colorectal cancer screening guidelines for "routine risk" patients with colonoscopy in 10 years.  You will receive a letter within 1-2 weeks with the results of your biopsy as well as final recommendations.  Please call my office if you have not received a letter after 3 weeks.   eSigned:  Louis Meckel, MD 09/07/2013 11:39 AM   cc:   PATIENT NAME:  [name], [name] MR#: [id number]

## 2013-09-07 NOTE — Progress Notes (Signed)
Report to pacu rn, vss, bbs=clear 

## 2013-09-07 NOTE — Progress Notes (Signed)
Called to room to assist during endoscopic procedure.  Patient ID and intended procedure confirmed with present staff. Received instructions for my participation in the procedure from the performing physician.  

## 2013-09-07 NOTE — Progress Notes (Signed)
Patient did not have preoperative order for IV antibiotic SSI prophylaxis. (G8918)  Patient did not experience any of the following events: a burn prior to discharge; a fall within the facility; wrong site/side/patient/procedure/implant event; or a hospital transfer or hospital admission upon discharge from the facility. (G8907)  

## 2013-09-08 ENCOUNTER — Telehealth: Payer: Self-pay | Admitting: *Deleted

## 2013-09-08 NOTE — Telephone Encounter (Signed)
#   not complete taken by admitting yesterday .Both phone #s listed on demographics called for f/u but received no answer and did not leave message since this was not complete in adm

## 2013-09-14 ENCOUNTER — Encounter: Payer: Self-pay | Admitting: Gastroenterology

## 2013-10-04 ENCOUNTER — Encounter: Payer: Medicaid Other | Admitting: Internal Medicine

## 2013-10-12 ENCOUNTER — Emergency Department (HOSPITAL_COMMUNITY): Payer: Medicaid Other

## 2013-10-12 ENCOUNTER — Inpatient Hospital Stay (HOSPITAL_COMMUNITY)
Admission: EM | Admit: 2013-10-12 | Discharge: 2013-10-13 | DRG: 287 | Disposition: A | Discharge status: Home / Self Care | Payer: Medicaid Other | Attending: Internal Medicine | Admitting: Internal Medicine

## 2013-10-12 ENCOUNTER — Encounter (HOSPITAL_COMMUNITY): Payer: Self-pay | Admitting: Emergency Medicine

## 2013-10-12 DIAGNOSIS — E7800 Pure hypercholesterolemia, unspecified: Secondary | ICD-10-CM | POA: Diagnosis present

## 2013-10-12 DIAGNOSIS — Z9861 Coronary angioplasty status: Secondary | ICD-10-CM

## 2013-10-12 DIAGNOSIS — R079 Chest pain, unspecified: Secondary | ICD-10-CM

## 2013-10-12 DIAGNOSIS — R0989 Other specified symptoms and signs involving the circulatory and respiratory systems: Secondary | ICD-10-CM | POA: Diagnosis present

## 2013-10-12 DIAGNOSIS — I10 Essential (primary) hypertension: Secondary | ICD-10-CM | POA: Diagnosis present

## 2013-10-12 DIAGNOSIS — I2510 Atherosclerotic heart disease of native coronary artery without angina pectoris: Principal | ICD-10-CM | POA: Diagnosis present

## 2013-10-12 DIAGNOSIS — Z91199 Patient's noncompliance with other medical treatment and regimen due to unspecified reason: Secondary | ICD-10-CM

## 2013-10-12 DIAGNOSIS — G40909 Epilepsy, unspecified, not intractable, without status epilepticus: Secondary | ICD-10-CM | POA: Diagnosis present

## 2013-10-12 DIAGNOSIS — I200 Unstable angina: Secondary | ICD-10-CM | POA: Diagnosis present

## 2013-10-12 DIAGNOSIS — E785 Hyperlipidemia, unspecified: Secondary | ICD-10-CM | POA: Diagnosis present

## 2013-10-12 DIAGNOSIS — R569 Unspecified convulsions: Secondary | ICD-10-CM

## 2013-10-12 DIAGNOSIS — R0609 Other forms of dyspnea: Secondary | ICD-10-CM | POA: Diagnosis present

## 2013-10-12 DIAGNOSIS — I252 Old myocardial infarction: Secondary | ICD-10-CM

## 2013-10-12 DIAGNOSIS — Z9119 Patient's noncompliance with other medical treatment and regimen: Secondary | ICD-10-CM

## 2013-10-12 DIAGNOSIS — F17200 Nicotine dependence, unspecified, uncomplicated: Secondary | ICD-10-CM | POA: Diagnosis present

## 2013-10-12 DIAGNOSIS — I2582 Chronic total occlusion of coronary artery: Secondary | ICD-10-CM | POA: Diagnosis present

## 2013-10-12 LAB — BASIC METABOLIC PANEL
CO2: 25 mEq/L (ref 19–32)
Calcium: 9.1 mg/dL (ref 8.4–10.5)
Chloride: 98 mEq/L (ref 96–112)
Creatinine, Ser: 0.93 mg/dL (ref 0.50–1.35)
GFR calc Af Amer: >90 mL/min (ref >90)
GFR calc non Af Amer: >90 mL/min (ref >90)
Potassium: 3.7 mEq/L (ref 3.5–5.1)

## 2013-10-12 NOTE — ED Notes (Signed)
Per EMS, pt has had 4 seizures in the past 24 hours that have lasted longer than usual. Pt family member stated that pt has been having seizures more frequently, taking Phenobarbital but has not seen his neurologist lately.  Pt was not postictal on EMS arrival. Pt a&o x4 at this time. Reports 7/10 left sided HA

## 2013-10-13 ENCOUNTER — Encounter (HOSPITAL_COMMUNITY): Payer: Self-pay | Admitting: Physician Assistant

## 2013-10-13 ENCOUNTER — Encounter (HOSPITAL_COMMUNITY)
Admission: EM | Disposition: A | Discharge status: Home / Self Care | Payer: Self-pay | Source: Home / Self Care | Attending: Internal Medicine

## 2013-10-13 DIAGNOSIS — R569 Unspecified convulsions: Secondary | ICD-10-CM

## 2013-10-13 DIAGNOSIS — F17200 Nicotine dependence, unspecified, uncomplicated: Secondary | ICD-10-CM

## 2013-10-13 DIAGNOSIS — R079 Chest pain, unspecified: Secondary | ICD-10-CM

## 2013-10-13 DIAGNOSIS — I200 Unstable angina: Secondary | ICD-10-CM

## 2013-10-13 DIAGNOSIS — I2510 Atherosclerotic heart disease of native coronary artery without angina pectoris: Secondary | ICD-10-CM

## 2013-10-13 HISTORY — PX: LEFT HEART CATHETERIZATION WITH CORONARY ANGIOGRAM: SHX5451

## 2013-10-13 LAB — CBC
Hemoglobin: 11 g/dL — ABNORMAL LOW (ref 13.0–17.0)
MCH: 28.9 pg (ref 26.0–34.0)
MCHC: 33.1 g/dL (ref 30.0–36.0)
MCV: 87.4 fL (ref 78.0–100.0)
Platelets: 409 10*3/uL — ABNORMAL HIGH (ref 150–400)
RDW: 15.8 % — ABNORMAL HIGH (ref 11.5–15.5)
WBC: 7.9 10*3/uL (ref 4.0–10.5)

## 2013-10-13 LAB — PROTIME-INR: INR: 0.92 (ref 0.00–1.49)

## 2013-10-13 LAB — PHENOBARBITAL LEVEL: Phenobarbital: 14.9 ug/mL — ABNORMAL LOW (ref 15.0–40.0)

## 2013-10-13 LAB — MRSA PCR SCREENING: MRSA by PCR: NEGATIVE

## 2013-10-13 SURGERY — LEFT HEART CATHETERIZATION WITH CORONARY ANGIOGRAM
Anesthesia: LOCAL

## 2013-10-13 MED ORDER — SODIUM CHLORIDE 0.9 % IV SOLN
INTRAVENOUS | Status: DC
  Administered 2013-10-13: 75 mL/h via INTRAVENOUS

## 2013-10-13 MED ORDER — MIDAZOLAM HCL 2 MG/2ML IJ SOLN
INTRAMUSCULAR | Status: AC
  Filled 2013-10-13: qty 2

## 2013-10-13 MED ORDER — SODIUM CHLORIDE 0.9 % IJ SOLN
3.0000 mL | Freq: Two times a day (BID) | INTRAMUSCULAR | Status: DC
  Administered 2013-10-13: 3 mL via INTRAVENOUS

## 2013-10-13 MED ORDER — ASPIRIN 325 MG PO TABS: 325.0000 mg | ORAL_TABLET | Freq: Every day | ORAL | Status: DC

## 2013-10-13 MED ORDER — SIMVASTATIN 10 MG PO TABS
10.0000 mg | ORAL_TABLET | Freq: Every day | ORAL | Status: DC
  Filled 2013-10-13: qty 1

## 2013-10-13 MED ORDER — LIDOCAINE HCL (PF) 1 % IJ SOLN
INTRAMUSCULAR | Status: AC
  Filled 2013-10-13: qty 30

## 2013-10-13 MED ORDER — NITROGLYCERIN 0.2 MG/ML ON CALL CATH LAB
INTRAVENOUS | Status: AC
  Filled 2013-10-13: qty 1

## 2013-10-13 MED ORDER — METOPROLOL TARTRATE 25 MG PO TABS: 25.0000 mg | ORAL_TABLET | Freq: Two times a day (BID) | ORAL | Status: DC

## 2013-10-13 MED ORDER — PHENOBARBITAL 64.8 MG PO TABS: 64.8000 mg | ORAL_TABLET | Freq: Two times a day (BID) | ORAL | Status: DC

## 2013-10-13 MED ORDER — FENTANYL CITRATE 0.05 MG/ML IJ SOLN
INTRAMUSCULAR | Status: AC
  Filled 2013-10-13: qty 2

## 2013-10-13 MED ORDER — SIMVASTATIN 40 MG PO TABS
40.0000 mg | ORAL_TABLET | Freq: Every day | ORAL | Status: DC
  Filled 2013-10-13: qty 1

## 2013-10-13 MED ORDER — ASPIRIN 81 MG PO CHEW
324.0000 mg | CHEWABLE_TABLET | Freq: Once | ORAL | Status: AC
  Administered 2013-10-13: 324 mg via ORAL
  Filled 2013-10-13: qty 4

## 2013-10-13 MED ORDER — SODIUM CHLORIDE 0.9 % IV SOLN: INTRAVENOUS | Status: DC

## 2013-10-13 MED ORDER — HEPARIN SODIUM (PORCINE) 1000 UNIT/ML IJ SOLN
INTRAMUSCULAR | Status: AC
  Filled 2013-10-13: qty 1

## 2013-10-13 MED ORDER — PHENOBARBITAL 32.4 MG PO TABS: 64.8000 mg | ORAL_TABLET | Freq: Two times a day (BID) | ORAL | Status: DC

## 2013-10-13 MED ORDER — PHENOBARBITAL 32.4 MG PO TABS
64.8000 mg | ORAL_TABLET | Freq: Once | ORAL | Status: AC
  Administered 2013-10-13: 64.8 mg via ORAL
  Filled 2013-10-13: qty 2

## 2013-10-13 MED ORDER — ONDANSETRON HCL 4 MG/2ML IJ SOLN: 4.0000 mg | Freq: Four times a day (QID) | INTRAMUSCULAR | Status: DC | PRN

## 2013-10-13 MED ORDER — NITROGLYCERIN 0.4 MG SL SUBL
0.4000 mg | SUBLINGUAL_TABLET | SUBLINGUAL | Status: DC | PRN
  Administered 2013-10-13 (×2): 0.4 mg via SUBLINGUAL
  Filled 2013-10-13: qty 25

## 2013-10-13 MED ORDER — ACETAMINOPHEN 325 MG PO TABS: 650.0000 mg | ORAL_TABLET | ORAL | Status: DC | PRN

## 2013-10-13 MED ORDER — METOPROLOL TARTRATE 25 MG PO TABS
25.0000 mg | ORAL_TABLET | Freq: Two times a day (BID) | ORAL | Status: DC
  Administered 2013-10-13: 25 mg via ORAL
  Filled 2013-10-13: qty 1

## 2013-10-13 MED ORDER — ASPIRIN EC 81 MG PO TBEC
81.0000 mg | DELAYED_RELEASE_TABLET | Freq: Every day | ORAL | Status: DC
  Filled 2013-10-13 (×2): qty 1

## 2013-10-13 MED ORDER — SODIUM CHLORIDE 0.9 % IJ SOLN: 3.0000 mL | INTRAMUSCULAR | Status: DC | PRN

## 2013-10-13 MED ORDER — PHENOBARBITAL 60 MG PO TABS: 60.0000 mg | ORAL_TABLET | Freq: Two times a day (BID) | ORAL | Status: DC

## 2013-10-13 MED ORDER — VERAPAMIL HCL 2.5 MG/ML IV SOLN
INTRAVENOUS | Status: AC
  Filled 2013-10-13: qty 2

## 2013-10-13 MED ORDER — MORPHINE SULFATE 2 MG/ML IJ SOLN: 2.0000 mg | INTRAMUSCULAR | Status: DC | PRN

## 2013-10-13 MED ORDER — HEPARIN SODIUM (PORCINE) 5000 UNIT/ML IJ SOLN
5000.0000 [IU] | Freq: Three times a day (TID) | INTRAMUSCULAR | Status: DC
  Filled 2013-10-13 (×5): qty 1

## 2013-10-13 MED ORDER — HEPARIN (PORCINE) IN NACL 2-0.9 UNIT/ML-% IJ SOLN
INTRAMUSCULAR | Status: AC
  Filled 2013-10-13: qty 1000

## 2013-10-13 MED ORDER — SODIUM CHLORIDE 0.9 % IV SOLN: 250.0000 mL | INTRAVENOUS | Status: DC | PRN

## 2013-10-13 NOTE — H&P (View-Only) (Signed)
 Cardiology Consult Note   Patient ID: [name] MRN: [id number], DOB/AGE: 12/04/1960   Admit date: 10/12/2013 Date of Consult: 10/13/2013  Primary Physician: BLOUNT,ALVIN VINCENT, MD Primary Cardiologist: M. Poonam Woehrle, MD  Reason for consult: chest pain, dyspnea, h/o CAD   HPI: [name] is a 52 y.o. African American male w/ PMHx s/f CAD (see below), h/o "clotting disorder", seizure disorder, HLD, HTN, ongoing tobacco abuse and medical noncompliance who was admitted to [facility] early this morning after experiencing 4 seizures.  Cardiac history is outlined below. He had trouble affording Plavix after his discharge in 03/2011. Assistance was arranged as an outpatient. The patient did not attend his post-hospital follow-up appointment. He had a very similar presentation in 10/2012- chest pain in the setting of tonic-clonic seizures, TnI negative x 3. The patient left AMA prior to further ischemic eval. He has had multiple instances of AMA in the past. Prior financial limitations, now on Medicaid. The patient is unable to provide specific details about h/o clotting disorder. Never had a blood clot, no prior anticoagulation.  He reports experiencing worsening DOE x 2-3 weeks, most noticeable when climbing steps outside his home. He was carrying groceries yesterday, climbed the steps and sat down to rest. Shortly after, he developed R-sided radiating to substernal chest "grabbing" w/o radiation and w/ associated diaphoresis, nausea and shortness of breath. He initially thought this was indigestion. He took two ASA w/ eventual relief x 30 min. He denies PND, orthopnea, LE edema, abdominal distention, palpitations or syncope. No fevers or chills. He has not been taking Plavix. Takes a daily baby ASA and fish oil.   In the ED, EKG shows no ischemic changes. TnI x 2 WNL. CBC- Hgb 11.0/Hct 33.2, MCV 87.4, PLT 209. BMET- Na 132, otherwise WNL. CXR- no acute cardiopulmonary process, mild  cardiomegaly. He was admitted by the medicine service mostly due to his c/o exertional chest pain and dyspnea w/ significant cardiac history.   Problem List: Past Medical History  Diagnosis Date  . CAD (coronary artery disease)     a. Overlapping BMS-prox LCx in 2009 b. NSTEMI 2012 due to prox LCx ISR s/p PTCA alone; EF 45% w/ basal inf HK. Had run out of Plavix 1 week prior.  . HLD (hyperlipidemia)   . Seizure disorder   . Angina   . Hypertension   . TOBACCO ABUSE 02/22/2009  . Clotting disorder   . Seizures   . Structural heart disease     a. Echo 03/2011: EF 50%, mild LVH, basal-mid posterior and basal inferior wall HK, mild AI, mod MR, mild biatrial enlargement, normal RV size/function, PASP     Past Surgical History  Procedure Laterality Date  . Coronary angioplasty with stent placement  10/2007    BMS-prox LCx  . Coronary angioplasty  03/2011    PTCA-prox LCx ISR     Allergies:  Allergies  Allergen Reactions  . Food Allergy Formula Other (See Comments)    Sage Causes seizures:    Home Medications: Prior to Admission medications   Medication Sig Start Date End Date Taking? Authorizing Provider  AMBULATORY NON FORMULARY MEDICATION Prosvent prostate health Take 1 softgel once daily   Yes Historical Provider, MD  aspirin 325 MG tablet Take 325 mg by mouth daily.   Yes Historical Provider, MD  aspirin 81 MG tablet Take 162 mg by mouth daily.    Yes Historical Provider, MD  dextromethorphan (ROBITUSSIN CHILDRENS COUGH LA) 7.5 MG/5ML SYRP Take   15 mg by mouth every 6 (six) hours as needed.   Yes Historical Provider, MD  nitroGLYCERIN (NITROSTAT) 0.4 MG SL tablet Place 1 tablet (0.4 mg total) under the tongue every 5 (five) minutes as needed. 11/10/12  Yes Estela Y Hernandez Acosta, MD  Omega-3 Fatty Acids (FISH OIL PO) Take 1 capsule by mouth daily.   Yes Historical Provider, MD  PHENObarbital (LUMINAL) 30 MG tablet Take 60 mg by mouth 2 (two) times daily.   Yes Historical Provider,  MD  pravastatin (PRAVACHOL) 20 MG tablet Take 20 mg by mouth daily.   Yes Historical Provider, MD    Inpatient Medications:  . aspirin  325 mg Oral Daily  . heparin  5,000 Units Subcutaneous Q8H  . PHENObarbital  60 mg Oral BID  . simvastatin  10 mg Oral q1800   Prescriptions prior to admission  Medication Sig Dispense Refill  . AMBULATORY NON FORMULARY MEDICATION Prosvent prostate health Take 1 softgel once daily      . aspirin 325 MG tablet Take 325 mg by mouth daily.      . aspirin 81 MG tablet Take 162 mg by mouth daily.       . dextromethorphan (ROBITUSSIN CHILDRENS COUGH LA) 7.5 MG/5ML SYRP Take 15 mg by mouth every 6 (six) hours as needed.      . nitroGLYCERIN (NITROSTAT) 0.4 MG SL tablet Place 1 tablet (0.4 mg total) under the tongue every 5 (five) minutes as needed.  30 tablet  0  . Omega-3 Fatty Acids (FISH OIL PO) Take 1 capsule by mouth daily.      . PHENObarbital (LUMINAL) 30 MG tablet Take 60 mg by mouth 2 (two) times daily.      . pravastatin (PRAVACHOL) 20 MG tablet Take 20 mg by mouth daily.       FAMILY HISTORY:  Maternal aunts and uncles with CAD/MI. No cardiac history in the immediate family.   History   Social History  . Marital Status: Single    Spouse Name: [name]    Number of Children: 0  . Years of Education: [name]   Occupational History  . Not on file.   Social History Main Topics  . Smoking status: Current Every Day Smoker -- 1.00 packs/day for 35 years    Types: Cigars  . Smokeless tobacco: Never Used  . Alcohol Use: No  . Drug Use: No  . Sexual Activity: Not Currently   Other Topics Concern  . Not on file   Social History Narrative  . No narrative on file     Review of Systems: General: negative for chills, fever, night sweats or weight changes.  Cardiovascular: positive for chest pain, dyspnea on exertion, negative for edema, orthopnea, palpitations, paroxysmal nocturnal dyspnea or shortness of breath Dermatological: negative for  rash Respiratory: negative for cough or wheezing Urologic: negative for hematuria Abdominal: negative for nausea, vomiting, diarrhea, bright red blood per rectum, melena, or hematemesis Neurologic: negative for visual changes, syncope, or dizziness All other systems reviewed and are otherwise negative except as noted above.  Physical Exam: Blood pressure 153/102, pulse 73, temperature 98.6 F (37 C), temperature source Oral, resp. rate 12, SpO2 99.00%.    General: Well developed, well nourished, in no acute distress. Head: Normocephalic, atraumatic, sclera non-icteric, no xanthomas, nares are without discharge, poor dentition Neck: Negative for carotid bruits. JVD not elevated. Lungs: Clear bilaterally to auscultation without wheezes, rales, or rhonchi. Breathing is unlabored. Heart: RRR with S1 S2. No murmurs, rubs,   or gallops appreciated. Abdomen: Soft, non-tender, non-distended with normoactive bowel sounds. No hepatomegaly. No rebound/guarding. No obvious abdominal masses. Msk:  Strength and tone appears normal for age. Extremities: No clubbing, cyanosis or edema.  Distal pedal pulses are 2+ and equal bilaterally. Neuro: Alert and oriented X 3. Moves all extremities spontaneously. Psych:  Responds to questions appropriately with a normal affect.  Labs: Recent Labs     10/12/13  2245  WBC  7.9  HGB  11.0*  HCT  33.2*  MCV  87.4  PLT  409*   Recent Labs Lab 10/12/13 2245  NA 132*  K 3.7  CL 98  CO2 25  BUN 9  CREATININE 0.93  CALCIUM 9.1  GLUCOSE 95   Recent Labs     10/12/13  2245  10/13/13  0200  TROPONINI  <0.30  <0.30   Radiology/Studies: Dg Chest Port 1 View  10/12/2013   CLINICAL DATA:  Shortness of breath and altered mental status; seizures.  EXAM: PORTABLE CHEST - 1 VIEW  COMPARISON:  Chest radiograph performed 06/01/2013  FINDINGS: The lungs are well-aerated and clear. There is no evidence of focal opacification, pleural effusion or pneumothorax. The  right costophrenic angle is incompletely imaged on this study.  The heart is mildly enlarged. No acute osseous abnormalities are seen.  IMPRESSION: No acute cardiopulmonary process seen; mild cardiomegaly noted.   Electronically Signed   By: Jeffery  Chang M.D.   On: 10/12/2013 22:57    EKG: NSR, 88 bpm, LAE, LVH, no ST/T changes  ASSESSMENT AND PLAN:   1. Chest pain, moderate-high risk for cardiac etiology 2. Dyspnea on exertion 3. H/o CAD s/p multiple PCIs 4. HTN 5. HLD 6. History of noncompliance 7. Seizure disorder 8. Questionable history of clotting disorder 9. Ongoing tobacco abuse  The patient presented to the hospital yesterday after experiencing 4-5 seizures. Upon further questioning, he endorses worsening DOE x 2-3 weeks when climbing stairs. He had an episode of R sided chest pressure radiating to his substernal chest with nausea, diaphoresis. This occurred while resting after exertion. Objectively, EKG shows no evidence of ischemia. LVH is apparent. He was hypertensive (SBP 180s) on interview. TnI WNLx 2. Of note, the patient had 5/10 chest pain on my exam, relieved to 2/10 after NTG SL x 2. Given his cardiac history, medical noncompliance, lack of risk factor modification and high pretest probability, concern is for unstable angina. Will proceed with ischemic evaluation in the form of diagnostic cardiac cath today. The main limiting factor going forward will be noncompliance i.e. Should he need PCI/stenting, DAPT uninterrupted for at least 1 month if BMS used would be mandatory. Keep NPO. NTG SL, morphine PRN. Agree with cycling troponins. Risk stratify with lipid panel, Hgb A1C. Check TSH. Low-dose ASA, BB, upgrade statin, NTG SL PRN. Smoking cessation counseling. Consider case management consult to assist with medication affordability. Further recommendations per interventionalist's findings. Of note, chest pain was R sided. No prior cholecystectomy. Could radiate from RUQ. If cath  unrevealing, consider GI/biliary etiologies.    Signed, R. Tony Arguello, PA-C 10/13/2013, 7:20 AM   Patient seen, examined. Available data reviewed. Agree with findings, assessment, and plan as outlined by Roger Arguello, PA-C. The patient is known to me from previous PCI procedures and outpatient followup. He hasn't been seen in the outpatient setting and at least 2-3 years. Unfortunately his medical care has been complicated by limited resources and noncompliance. He has undergone previous stenting of the left circumflex on 3   separate occasions. He presents now with symptoms concerning for unstable angina. He's had exertional dyspnea with low-level activity as well as chest pain and diaphoresis. Effects of resting symptoms with pain in his upper back that have felt like his previous heart attack. EKG is nondiagnostic and troponin levels are normal. Considering his continued poorly controlled risk factors, typical history of unstable angina, and crescendo pattern of symptoms, recommend proceeding directly with diagnostic cardiac catheterization and possible PCI. I have reviewed the risks, indications, and alternatives to this approach. The patient understands and agrees to proceed. At his request, I called his mother and discussed our plan with her as well.  Jiayi Lengacher, M.D. 10/13/2013 9:51 AM    

## 2013-10-13 NOTE — Interval H&P Note (Signed)
History and Physical Interval Note:  10/13/2013 9:54 AM  [name]  has presented today for surgery, with the diagnosis of Chest pain  The various methods of treatment have been discussed with the patient and family. After consideration of risks, benefits and other options for treatment, the patient has consented to  Procedure(s): LEFT HEART CATHETERIZATION WITH CORONARY ANGIOGRAM ([name]) as a surgical intervention .  The patient's history has been reviewed, patient examined, no change in status, stable for surgery.  I have reviewed the patient's chart and labs.  Questions were answered to the patient's satisfaction.    Cath Lab Visit (complete for each Cath Lab visit)  Clinical Evaluation Leading to the Procedure:   ACS: yes  Non-ACS:    Anginal Classification: CCS IV  Anti-ischemic medical therapy: Minimal Therapy (1 class of medications)  Non-Invasive Test Results: No non-invasive testing performed  Prior CABG: No previous CABG       Tonny Bollman

## 2013-10-13 NOTE — Discharge Summary (Signed)
Physician Discharge Summary  WHITTEN ANDREONI ZOX:[id number] DOB: 07/15/1961 DOA: 10/12/2013  PCP: Burtis Junes, MD  Admit date: 10/12/2013 Discharge date: 10/13/2013  Time spent: 40 minutes  Recommendations for Outpatient Follow-up:  1. Home with outpatient PCP follow up in 1 week  Discharge Diagnoses:  Principal Problem:   Unstable angina pectoris  Active Problems:   SEIZURE DISORDER   HYPERLIPIDEMIA   HYPERTENSION   Chest pain at rest   CAD (coronary artery disease)   Discharge Condition: fair  Diet recommendation: cardiac  There were no vitals filed for this visit.  History of present illness:  52 y.o. male with significant CAD, ongoing tobacco use and medication non compliance,  who presents to the ED after 4 seizures on the day of admission (has chronic seizure issues), while here he does admit to chest pain. Patient has been having chest pain and shortness of breath with any activity recently. Worse over the past few months. Chest pain does feel like when he was having his prior CAD symptoms.  Patient has an extensive history of CAD including 4 stents, non-compliance with plavix, and re intervention on an occluded stent last in 2012. Patient admitted to stepdown telemetry for unstable angina symptoms. Serial troponins were negative. Patient seen by [facility] cardiology and taken for cardiac cath today.   Hospital Course:  Unstable angina  has significant CAD. Underlying medication non compliance and active tobacco use. Cardiac cath reported as : "1. Mild to Moderate stenosis of the distal left main, ostial circumflex, and ramus intermedius, and the mid LAD  2. Continued patency of the stented segment in the proximal left circumflex.  3. Total occlusion of the right coronary artery.  4. Moderate segmental left ventricular systolic dysfunction Recommend this to be same findigns as previous cath and recommend for aggressive risk reduction.  Patient stable post  cath and chest pain free. Will resume home meds including ASA and statin. Added metoprolol for better BP control. continue s/l nitrate prn. He has been strongly advised on smoking cessation and medication adherence . Also instructed on outpt follow up   Seizure dx  patient reported 3-4 episode of seizures yesterday. Phenobarbitone level was subtheraperutic . Suspect medication non compliance. instructed on compliance. Dose increased and should follow up with his PCP.  patient can be discharged homer with outpt follow up.   Procedures:  cardiac cath on 12/24  Consultations:  [facility]  Discharge Exam: Filed Vitals:   10/13/13 1330  BP: 144/92  Pulse: 58  Temp:   Resp: 13    General: middle aged male in NAD  HEENT: no pallor, moist mucosa  chest: clear b/l  CVS: NS1&S2, no murmurs Abd: soft, NT, N,D BS+ Ext: warm, no edema, rt wrist catheter site clean, no bleeding  CNS: AAOX3   Discharge Instructions     Medication List         AMBULATORY NON FORMULARY MEDICATION  - Prosvent prostate health  - Take 1 softgel once daily     aspirin 325 MG tablet  Take 325 mg by mouth daily.     FISH OIL PO  Take 1 capsule by mouth daily.     metoprolol tartrate 25 MG tablet  Commonly known as:  LOPRESSOR  Take 1 tablet (25 mg total) by mouth 2 (two) times daily.     nitroGLYCERIN 0.4 MG SL tablet  Commonly known as:  NITROSTAT  Place 1 tablet (0.4 mg total) under the tongue every 5 (five) minutes as  needed.     PHENobarbital 32.4 MG tablet  Commonly known as:  LUMINAL  Take 2 tablets (64.8 mg total) by mouth 2 (two) times daily.     pravastatin 20 MG tablet  Commonly known as:  PRAVACHOL  Take 20 mg by mouth daily.     ROBITUSSIN CHILDRENS COUGH LA 7.5 MG/5ML Syrp  Generic drug:  dextromethorphan  Take 15 mg by mouth every 6 (six) hours as needed.       Allergies  Allergen Reactions  . Food Allergy Formula Other (See Comments)    Sage Causes  seizures:       Follow-up Information   Follow up with Burtis Junes, MD. Schedule an appointment as soon as possible for a visit in 1 week.   Specialty:  Family Medicine   Contact information:   [address] [address] [place] [address] [phone]       Follow up with Tonny Bollman, MD. Schedule an appointment as soon as possible for a visit in 4 weeks.   Specialty:  Cardiology   Contact information:   [address] [place] [address] [phone]        The results of significant diagnostics from this hospitalization (including imaging, microbiology, ancillary and laboratory) are listed below for reference.    Significant Diagnostic Studies: Dg Chest Port 1 View  10/12/2013   CLINICAL DATA:  Shortness of breath and altered mental status; seizures.  EXAM: PORTABLE CHEST - 1 VIEW  COMPARISON:  Chest radiograph performed 06/01/2013  FINDINGS: The lungs are well-aerated and clear. There is no evidence of focal opacification, pleural effusion or pneumothorax. The right costophrenic angle is incompletely imaged on this study.  The heart is mildly enlarged. No acute osseous abnormalities are seen.  IMPRESSION: No acute cardiopulmonary process seen; mild cardiomegaly noted.   Electronically Signed   By: Roanna Raider M.D.   On: 10/12/2013 22:57    Microbiology: Recent Results (from the past 240 hour(s))  MRSA PCR SCREENING     Status: None   Collection Time    10/13/13  5:36 AM      Result Value Range Status   MRSA by PCR NEGATIVE  NEGATIVE Final   Comment:            The GeneXpert MRSA Assay (FDA     approved for NASAL specimens     only), is one component of a     comprehensive MRSA colonization     surveillance program. It is not     intended to diagnose MRSA     infection nor to guide or     monitor treatment for     MRSA infections.     Labs: Basic Metabolic Panel:  Recent Labs Lab 10/12/13 2245  NA 132*  K 3.7  CL 98   CO2 25  GLUCOSE 95  BUN 9  CREATININE 0.93  CALCIUM 9.1   Liver Function Tests: No results found for this basename: AST, ALT, ALKPHOS, BILITOT, PROT, ALBUMIN,  in the last 168 hours No results found for this basename: LIPASE, AMYLASE,  in the last 168 hours No results found for this basename: AMMONIA,  in the last 168 hours CBC:  Recent Labs Lab 10/12/13 2245  WBC 7.9  HGB 11.0*  HCT 33.2*  MCV 87.4  PLT 409*   Cardiac Enzymes:  Recent Labs Lab 10/12/13 2245 10/13/13 0200 10/13/13 0920  TROPONINI <0.30 <0.30 <0.30   BNP: BNP (  last 3 results) No results found for this basename: PROBNP,  in the last 8760 hours CBG: No results found for this basename: GLUCAP,  in the last 168 hours     Signed:  Eddie North  [facility] 10/13/2013, 1:52 PM

## 2013-10-13 NOTE — ED Provider Notes (Signed)
CSN: [id number]     Arrival date & time 10/12/13  2214 History   First MD Initiated Contact with Patient 10/12/13 2302     Chief Complaint  Patient presents with  . Seizures  . Headache  . Chest Pain   (Consider location/radiation/quality/duration/timing/severity/associated sxs/prior Treatment) HPI Comments: 52 yo male with CAD, cardiac stents, seizure disorder on phenobarb, no blood thinners presents after 4 brief seizures, witnessed by family, generalized, similar to previous, pt has not missed doses of meds but has had URI sxs and taking OTC meds.  He started having chest ache on arrival- more right sided, similar to previous cardiac cp, non radiating.  Recently has had exertional sxs.  He has refused work up in the past but is willing to be admitted.   Patient is a 52 y.o. male presenting with seizures, headaches, and chest pain. The history is provided by the patient.  Seizures Seizure activity on arrival: no   Headache Associated symptoms: seizures   Chest Pain Associated symptoms: headache     Past Medical History  Diagnosis Date  . CAD (coronary artery disease)     a. Overlapping BMS-prox LCx in 2009 b. NSTEMI 2012 due to prox LCx ISR s/p PTCA alone; EF 45% w/ basal inf HK. Had run out of Plavix 1 week prior.  [name] HLD (hyperlipidemia)   . Seizure disorder   . Angina   . Hypertension   . TOBACCO ABUSE 02/22/2009  . Clotting disorder   . Seizures   . Structural heart disease     a. Echo 03/2011: EF 50%, mild LVH, basal-mid posterior and basal inferior wall HK, mild AI, mod MR, mild biatrial enlargement, normal RV size/function, PASP    Past Surgical History  Procedure Laterality Date  . Coronary angioplasty with stent placement  10/2007    BMS-prox LCx  . Coronary angioplasty  03/2011    PTCA-prox LCx ISR   Family History  Problem Relation Age of Onset  .      History  Substance Use Topics  . Smoking status: Current Every Day Smoker -- 1.00 packs/day for 35 years     Types: Cigars  . Smokeless tobacco: Never Used  . Alcohol Use: No    Review of Systems  Cardiovascular: Positive for chest pain.  Neurological: Positive for seizures and headaches.    Allergies  Food allergy formula  Home Medications  No current outpatient prescriptions on file. BP 129/82  Pulse 64  Temp(Src) 98.2 F (36.8 C) (Oral)  Resp 11  SpO2 98% Physical Exam  Nursing note and vitals reviewed. Constitutional: He is oriented to person, place, and time. He appears well-developed and well-nourished.  HENT:  Head: Normocephalic and atraumatic.  Eyes: Conjunctivae are normal. Right eye exhibits no discharge. Left eye exhibits no discharge.  Neck: Normal range of motion. Neck supple. No tracheal deviation present.  Cardiovascular: Normal rate and regular rhythm.   Pulmonary/Chest: Effort normal and breath sounds normal.  Abdominal: Soft. He exhibits no distension. There is no tenderness. There is no guarding.  Musculoskeletal: He exhibits no edema.  Neurological: He is alert and oriented to person, place, and time. No cranial nerve deficit. GCS eye subscore is 4. GCS verbal subscore is 5. GCS motor subscore is 6.  5+ strength in UE and LE with f/e at major joints. Sensation to palpation intact in UE and LE. CNs 2-12 grossly intact.  EOMFI.  PERRL.   Finger nose and coordination intact bilateral.   Visual fields  intact to finger testing.   Skin: Skin is warm. No rash noted.  Psychiatric: He has a normal mood and affect.    ED Course  Procedures (including critical care time) Labs Review Labs Reviewed  CBC - Abnormal; Notable for the following:    RBC 3.80 (*)    Hemoglobin 11.0 (*)    HCT 33.2 (*)    RDW 15.8 (*)    Platelets 409 (*)    All other components within normal limits  BASIC METABOLIC PANEL - Abnormal; Notable for the following:    Sodium 132 (*)    All other components within normal limits  PHENOBARBITAL LEVEL - Abnormal; Notable for the following:     Phenobarbital 14.9 (*)    All other components within normal limits  MRSA PCR SCREENING  TROPONIN I  TROPONIN I   Imaging Review Dg Chest Port 1 View  10/12/2013   CLINICAL DATA:  Shortness of breath and altered mental status; seizures.  EXAM: PORTABLE CHEST - 1 VIEW  COMPARISON:  Chest radiograph performed 06/01/2013  FINDINGS: The lungs are well-aerated and clear. There is no evidence of focal opacification, pleural effusion or pneumothorax. The right costophrenic angle is incompletely imaged on this study.  The heart is mildly enlarged. No acute osseous abnormalities are seen.  IMPRESSION: No acute cardiopulmonary process seen; mild cardiomegaly noted.   Electronically Signed   By: Roanna Raider M.D.   On: 10/12/2013 22:57    EKG Interpretation    Date/Time:  Tuesday October 12 2013 22:20:39 EST Ventricular Rate:  88 PR Interval:  166 QRS Duration: 102 QT Interval:  366 QTC Calculation: 443 R Axis:   9 Text Interpretation:  Age not entered, assumed to be  52 years old for purpose of ECG interpretation Sinus rhythm Probable left atrial enlargement Abnormal R-wave progression, early transition Confirmed by Amun Stemm  MD, Bronco Mcgrory ([id number]) on 10/12/2013 11:09:45 PM            MDM   1. CAD (coronary artery disease)   2. Chest pain at rest   3. Other convulsions    Seizures - similar to previous, no signs of meningitis, phenobarb low- po given. CP - high risk hx, plan for admission for stress vs cath.  ASA in ED.   Spoke with [facility], coordinated admission for patient.  The patients results and plan were reviewed and discussed.   Any x-rays performed were personally reviewed by myself.   Differential diagnosis were considered with the presenting HPI.  Diagnosis: above plus Seizures  EKG: reviewed  Admission/ observation were discussed with the admitting physician, patient and/or family and they are comfortable with the plan.     Enid Skeens, MD 10/13/13 [phone]

## 2013-10-13 NOTE — CV Procedure (Signed)
    Cardiac Catheterization Procedure Note  Name: [name] MRN: [id number] DOB: Nov 12, 1960  Procedure: Left Heart Cath, Selective Coronary Angiography, LV angiography, IVUS of the mid LAD and left main.  Indication: Unstable angina   Procedural Details: The right wrist was prepped, draped, and anesthetized with 1% lidocaine. Using the modified Seldinger technique, a 5/6 French sheath was introduced into the right radial artery. 3 mg of verapamil was administered through the sheath, weight-based unfractionated heparin was administered intravenously. Standard Judkins catheters were used for selective coronary angiography and left ventriculography. Catheter exchanges were performed over an exchange length guidewire. Following angiography, I elected to perform intravascular ultrasound of the mid LAD and left main. There was moderate approximately 50% distal left main stenosis that has progressed slightly since his previous study. I felt this was important to better define considering his presentation typical of unstable angina. There were no immediate procedural complications. A TR band was used for radial hemostasis at the completion of the procedure.  The patient was transferred to the post catheterization recovery area for further monitoring.  Procedural Findings: Hemodynamics: AO 112/66 LV 114/14  Coronary angiography: Coronary dominance: right  Left mainstem: Large vessel proximally, tapers with 50% distal left main stenosis with a shelf-like plaque. Divides into the LAD and left circumflex the  Left anterior descending (LAD): The LAD is a large-caliber vessel. Proximally the vessel is widely patent. The mid vessel has 40-50% stenosis. The first diagonal branch is patent without significant stenosis. The vessel wraps around the left ventricular apex and is large in caliber even into the distal segment.  Left circumflex (LCx): The left circumflex has 50% ostial stenosis. There is an  intermediate branch with 70-75% stenosis which is unchanged from previous studies. The stented segment in the mid circumflex is patent with mild diffuse in-stent restenosis of 30%. Beyond the stented segment there is nonobstructive 30-40% stenosis leading into a single obtuse marginal branch  Right coronary artery (RCA): The vessel is totally occluded in its proximal aspect. It fills from left to right collaterals. This is unchanged from the previous study.  Intravascular ultrasound findings. The mid LAD reference vessel is widely patent with no atherosclerosis. The luminal area is 12 mm. The mid LAD lesion site has a minimal lumen area of 5.4 and percent stenosis of 55. The distal left main has eccentric plaque with a minimal lumen area of 7.5 mm. The proximal left main is very large in caliber.  Left ventriculography: The basal and midinferior wall are akinetic. The remaining portions of left ventricle contract normally. The estimated left ventricular ejection fraction is 40%.  Final Conclusions:   1. Mild to Moderate stenosis of the distal left main, ostial circumflex, and ramus intermedius, and the mid LAD 2. Continued patency of the stented segment in the proximal left circumflex. 3. Total occlusion of the right coronary artery. 4. Moderate segmental left ventricular systolic dysfunction  Recommendations: Essentially stable findings from previous cardiac catheterization. The patient needs continued aggressive risk reduction. I do not see evidence of a "culprit lesion" to explain acute coronary syndrome.  Tonny Bollman 10/13/2013, 10:55 AM

## 2013-10-13 NOTE — H&P (Signed)
[facility] History and Physical  [name] ZOX:[id number] DOB: 1961/09/09 DOA: 10/12/2013  Referring physician: EDP PCP: Kevin Junes, MD   Chief Complaint: Chest pain   HPI: [name] is a 52 y.o. male who presents to the ED after 4 seizures today (has chronic seizure issues), while here he does admit to chest pain.  Patient has been having chest pain and shortness of breath with any activity recently.  Worse over the past few months.  Chest pain does feel like when he was having his prior CAD symptoms.  Patient has an extensive history of CAD including 4 stents, non-compliance with plavix, and re intervention on an occluded stent last in 2012.  Review of Systems: Systems reviewed.  As above, otherwise negative  Past Medical History  Diagnosis Date  . CAD (coronary artery disease)   . HLD (hyperlipidemia)   . Seizure disorder   . Angina   . Hypertension   . Myocardial infarction   . TOBACCO ABUSE 02/22/2009  . Clotting disorder   . Seizures    Past Surgical History  Procedure Laterality Date  . Carotid stent  jan. 2009, apr. 2010    proximal left circumflex artery  . Cardiac catheterization     Social History:  reports that he has been smoking Cigars.  He has never used smokeless tobacco. He reports that he does not drink alcohol or use illicit drugs.  Allergies  Allergen Reactions  . Food Allergy Formula Other (See Comments)    Sage Causes seizures:    History reviewed. No pertinent family history.   Prior to Admission medications   Medication Sig Start Date End Date Taking? Authorizing Provider  AMBULATORY NON FORMULARY MEDICATION Prosvent prostate health Take 1 softgel once daily   Yes Historical Provider, MD  aspirin 325 MG tablet Take 325 mg by mouth daily.   Yes Historical Provider, MD  aspirin 81 MG tablet Take 162 mg by mouth daily.    Yes Historical Provider, MD  dextromethorphan (ROBITUSSIN CHILDRENS COUGH LA) 7.5 MG/5ML SYRP Take  15 mg by mouth every 6 (six) hours as needed.   Yes Historical Provider, MD  nitroGLYCERIN (NITROSTAT) 0.4 MG SL tablet Place 1 tablet (0.4 mg total) under the tongue every 5 (five) minutes as needed. 11/10/12  Yes Henderson Cloud, MD  Omega-3 Fatty Acids (FISH OIL PO) Take 1 capsule by mouth daily.   Yes Historical Provider, MD  PHENObarbital (LUMINAL) 30 MG tablet Take 60 mg by mouth 2 (two) times daily.   Yes Historical Provider, MD  pravastatin (PRAVACHOL) 20 MG tablet Take 20 mg by mouth daily.   Yes Historical Provider, MD   Physical Exam: Filed Vitals:   10/13/13 0127  BP: 136/86  Temp: 98.6 F (37 C)  Resp: 20    BP 136/86  Temp(Src) 98.6 F (37 C) (Oral)  Resp 20  SpO2 98%  General Appearance:    Alert, oriented, no distress, appears stated age  Head:    Normocephalic, atraumatic  Eyes:    PERRL, EOMI, sclera non-icteric        Nose:   Nares without drainage or epistaxis. Mucosa, turbinates normal  Throat:   Moist mucous membranes. Oropharynx without erythema or exudate.  Neck:   Supple. No carotid bruits.  No thyromegaly.  No lymphadenopathy.   Back:     No CVA tenderness, no spinal tenderness  Lungs:     Clear to auscultation bilaterally, without wheezes, rhonchi or rales  Chest wall:    No tenderness to palpitation  Heart:    Regular rate and rhythm without murmurs, gallops, rubs  Abdomen:     Soft, non-tender, nondistended, normal bowel sounds, no organomegaly  Genitalia:    deferred  Rectal:    deferred  Extremities:   No clubbing, cyanosis or edema.  Pulses:   2+ and symmetric all extremities  Skin:   Skin color, texture, turgor normal, no rashes or lesions  Lymph nodes:   Cervical, supraclavicular, and axillary nodes normal  Neurologic:   CNII-XII intact. Normal strength, sensation and reflexes      throughout    Labs on Admission:  Basic Metabolic Panel:  Recent Labs Lab 10/12/13 2245  NA 132*  K 3.7  CL 98  CO2 25  GLUCOSE 95  BUN 9   CREATININE 0.93  CALCIUM 9.1   Liver Function Tests: No results found for this basename: AST, ALT, ALKPHOS, BILITOT, PROT, ALBUMIN,  in the last 168 hours No results found for this basename: LIPASE, AMYLASE,  in the last 168 hours No results found for this basename: AMMONIA,  in the last 168 hours CBC: No results found for this basename: WBC, NEUTROABS, HGB, HCT, MCV, PLT,  in the last 168 hours Cardiac Enzymes:  Recent Labs Lab 10/12/13 2245  TROPONINI <0.30    BNP (last 3 results) No results found for this basename: PROBNP,  in the last 8760 hours CBG: No results found for this basename: GLUCAP,  in the last 168 hours  Radiological Exams on Admission: Dg Chest Port 1 View  10/12/2013   CLINICAL DATA:  Shortness of breath and altered mental status; seizures.  EXAM: PORTABLE CHEST - 1 VIEW  COMPARISON:  Chest radiograph performed 06/01/2013  FINDINGS: The lungs are well-aerated and clear. There is no evidence of focal opacification, pleural effusion or pneumothorax. The right costophrenic angle is incompletely imaged on this study.  The heart is mildly enlarged. No acute osseous abnormalities are seen.  IMPRESSION: No acute cardiopulmonary process seen; mild cardiomegaly noted.   Electronically Signed   By: Roanna Raider M.D.   On: 10/12/2013 22:57    EKG: Independently reviewed.  Assessment/Plan Principal Problem:   CAD (coronary artery disease) Active Problems:   SEIZURE DISORDER   Chest pain at rest   1. CAD and chest pain - given the patients history of CAD requiring multiple stents, non-compliance with plavix and subsequent in-stent restenosis recurring re-intervention in 2012, and the very classic description of his symptoms of SOB and chest pain worse with exertion progressively over the past few months.  I am very highly suspicious that the patient may have recurrence of CAD!  As I discussed with the patient, likely will need cardiology evaluation in AM, and given  that I suspect they may wish to proceed with heart cath, will go ahead and admit patient to [facility].  What is more alarming to me is that the patient almost never has allowed [place] to do risk stratification testing in the past (leaving AMA after being admitted for chest pain), and he IS allowing [place] to consider heart cath this time. 2. Seizure disorder - continue home meds.    Code Status: Full  Family Communication: Family at bedside Disposition Plan: Admit to inpatient   Time spent: 70 min  GARDNER, JARED M. [facility] Pager [phone]  If 7AM-7PM, please contact the day team taking care of the patient [email or web address] Password [facility] 10/13/2013, 2:34 AM

## 2013-10-13 NOTE — Consult Note (Signed)
Cardiology Consult Note   Patient ID: [name] MRN: [id number], DOB/AGE: Jun 27, 1961   Admit date: 10/12/2013 Date of Consult: 10/13/2013  Primary Physician: Burtis Junes, MD Primary Cardiologist: Judie Petit. Excell Seltzer, MD  Reason for consult: chest pain, dyspnea, h/o CAD   HPI: [name] is a 52 y.o. African American male w/ PMHx s/f CAD (see below), h/o "clotting disorder", seizure disorder, HLD, HTN, ongoing tobacco abuse and medical noncompliance who was admitted to [facility] early this morning after experiencing 4 seizures.  Cardiac history is outlined below. He had trouble affording Plavix after his discharge in 03/2011. Assistance was arranged as an outpatient. The patient did not attend his post-hospital follow-up appointment. He had a very similar presentation in 10/2012- chest pain in the setting of tonic-clonic seizures, TnI negative x 3. The patient left AMA prior to further ischemic eval. He has had multiple instances of AMA in the past. Prior financial limitations, now on Medicaid. The patient is unable to provide specific details about h/o clotting disorder. Never had a blood clot, no prior anticoagulation.  He reports experiencing worsening DOE x 2-3 weeks, most noticeable when climbing steps outside his home. He was carrying groceries yesterday, climbed the steps and sat down to rest. Shortly after, he developed R-sided radiating to substernal chest "grabbing" w/o radiation and w/ associated diaphoresis, nausea and shortness of breath. He initially thought this was indigestion. He took two ASA w/ eventual relief x 30 min. He denies PND, orthopnea, LE edema, abdominal distention, palpitations or syncope. No fevers or chills. He has not been taking Plavix. Takes a daily baby ASA and fish oil.   In the ED, EKG shows no ischemic changes. TnI x 2 WNL. CBC- Hgb 11.0/Hct 33.2, MCV 87.4, PLT 209. BMET- Na 132, otherwise WNL. CXR- no acute cardiopulmonary process, mild  cardiomegaly. He was admitted by the medicine service mostly due to his c/o exertional chest pain and dyspnea w/ significant cardiac history.   Problem List: Past Medical History  Diagnosis Date  . CAD (coronary artery disease)     a. Overlapping BMS-prox LCx in 2009 b. NSTEMI 2012 due to prox LCx ISR s/p PTCA alone; EF 45% w/ basal inf HK. Had run out of Plavix 1 week prior.  [name] HLD (hyperlipidemia)   . Seizure disorder   . Angina   . Hypertension   . TOBACCO ABUSE 02/22/2009  . Clotting disorder   . Seizures   . Structural heart disease     a. Echo 03/2011: EF 50%, mild LVH, basal-mid posterior and basal inferior wall HK, mild AI, mod MR, mild biatrial enlargement, normal RV size/function, PASP     Past Surgical History  Procedure Laterality Date  . Coronary angioplasty with stent placement  10/2007    BMS-prox LCx  . Coronary angioplasty  03/2011    PTCA-prox LCx ISR     Allergies:  Allergies  Allergen Reactions  . Food Allergy Formula Other (See Comments)    Sage Causes seizures:    Home Medications: Prior to Admission medications   Medication Sig Start Date End Date Taking? Authorizing Provider  AMBULATORY NON FORMULARY MEDICATION Prosvent prostate health Take 1 softgel once daily   Yes Historical Provider, MD  aspirin 325 MG tablet Take 325 mg by mouth daily.   Yes Historical Provider, MD  aspirin 81 MG tablet Take 162 mg by mouth daily.    Yes Historical Provider, MD  dextromethorphan (ROBITUSSIN CHILDRENS COUGH LA) 7.5 MG/5ML SYRP Take  15 mg by mouth every 6 (six) hours as needed.   Yes Historical Provider, MD  nitroGLYCERIN (NITROSTAT) 0.4 MG SL tablet Place 1 tablet (0.4 mg total) under the tongue every 5 (five) minutes as needed. 11/10/12  Yes Henderson Cloud, MD  Omega-3 Fatty Acids (FISH OIL PO) Take 1 capsule by mouth daily.   Yes Historical Provider, MD  PHENObarbital (LUMINAL) 30 MG tablet Take 60 mg by mouth 2 (two) times daily.   Yes Historical Provider,  MD  pravastatin (PRAVACHOL) 20 MG tablet Take 20 mg by mouth daily.   Yes Historical Provider, MD    Inpatient Medications:  . aspirin  325 mg Oral Daily  . heparin  5,000 Units Subcutaneous Q8H  . PHENObarbital  60 mg Oral BID  . simvastatin  10 mg Oral q1800   Prescriptions prior to admission  Medication Sig Dispense Refill  . AMBULATORY NON FORMULARY MEDICATION Prosvent prostate health Take 1 softgel once daily      . aspirin 325 MG tablet Take 325 mg by mouth daily.      [name] aspirin 81 MG tablet Take 162 mg by mouth daily.       [name] dextromethorphan (ROBITUSSIN CHILDRENS COUGH LA) 7.5 MG/5ML SYRP Take 15 mg by mouth every 6 (six) hours as needed.      . nitroGLYCERIN (NITROSTAT) 0.4 MG SL tablet Place 1 tablet (0.4 mg total) under the tongue every 5 (five) minutes as needed.  30 tablet  0  . Omega-3 Fatty Acids (FISH OIL PO) Take 1 capsule by mouth daily.      [name] PHENObarbital (LUMINAL) 30 MG tablet Take 60 mg by mouth 2 (two) times daily.      . pravastatin (PRAVACHOL) 20 MG tablet Take 20 mg by mouth daily.       FAMILY HISTORY:  Maternal aunts and uncles with CAD/MI. No cardiac history in the immediate family.   History   Social History  . Marital Status: Single    Spouse Name: [name]    Number of Children: 0  . Years of Education: [name]   Occupational History  . Not on file.   Social History Main Topics  . Smoking status: Current Every Day Smoker -- 1.00 packs/day for 35 years    Types: Cigars  . Smokeless tobacco: Never Used  . Alcohol Use: No  . Drug Use: No  . Sexual Activity: Not Currently   Other Topics Concern  . Not on file   Social History Narrative  . No narrative on file     Review of Systems: General: negative for chills, fever, night sweats or weight changes.  Cardiovascular: positive for chest pain, dyspnea on exertion, negative for edema, orthopnea, palpitations, paroxysmal nocturnal dyspnea or shortness of breath Dermatological: negative for  rash Respiratory: negative for cough or wheezing Urologic: negative for hematuria Abdominal: negative for nausea, vomiting, diarrhea, bright red blood per rectum, melena, or hematemesis Neurologic: negative for visual changes, syncope, or dizziness All other systems reviewed and are otherwise negative except as noted above.  Physical Exam: Blood pressure 153/102, pulse 73, temperature 98.6 F (37 C), temperature source Oral, resp. rate 12, SpO2 99.00%.    General: Well developed, well nourished, in no acute distress. Head: Normocephalic, atraumatic, sclera non-icteric, no xanthomas, nares are without discharge, poor dentition Neck: Negative for carotid bruits. JVD not elevated. Lungs: Clear bilaterally to auscultation without wheezes, rales, or rhonchi. Breathing is unlabored. Heart: RRR with S1 S2. No murmurs, rubs,  or gallops appreciated. Abdomen: Soft, non-tender, non-distended with normoactive bowel sounds. No hepatomegaly. No rebound/guarding. No obvious abdominal masses. Msk:  Strength and tone appears normal for age. Extremities: No clubbing, cyanosis or edema.  Distal pedal pulses are 2+ and equal bilaterally. Neuro: Alert and oriented X 3. Moves all extremities spontaneously. Psych:  Responds to questions appropriately with a normal affect.  Labs: Recent Labs     10/12/13  2245  WBC  7.9  HGB  11.0*  HCT  33.2*  MCV  87.4  PLT  409*   Recent Labs Lab 10/12/13 2245  NA 132*  K 3.7  CL 98  CO2 25  BUN 9  CREATININE 0.93  CALCIUM 9.1  GLUCOSE 95   Recent Labs     10/12/13  2245  10/13/13  0200  TROPONINI  <0.30  <0.30   Radiology/Studies: Dg Chest Port 1 View  10/12/2013   CLINICAL DATA:  Shortness of breath and altered mental status; seizures.  EXAM: PORTABLE CHEST - 1 VIEW  COMPARISON:  Chest radiograph performed 06/01/2013  FINDINGS: The lungs are well-aerated and clear. There is no evidence of focal opacification, pleural effusion or pneumothorax. The  right costophrenic angle is incompletely imaged on this study.  The heart is mildly enlarged. No acute osseous abnormalities are seen.  IMPRESSION: No acute cardiopulmonary process seen; mild cardiomegaly noted.   Electronically Signed   By: Roanna Raider M.D.   On: 10/12/2013 22:57    EKG: NSR, 88 bpm, LAE, LVH, no ST/T changes  ASSESSMENT AND PLAN:   1. Chest pain, moderate-high risk for cardiac etiology 2. Dyspnea on exertion 3. H/o CAD s/p multiple PCIs 4. HTN 5. HLD 6. History of noncompliance 7. Seizure disorder 8. Questionable history of clotting disorder 9. Ongoing tobacco abuse  The patient presented to the hospital yesterday after experiencing 4-5 seizures. Upon further questioning, he endorses worsening DOE x 2-3 weeks when climbing stairs. He had an episode of R sided chest pressure radiating to his substernal chest with nausea, diaphoresis. This occurred while resting after exertion. Objectively, EKG shows no evidence of ischemia. LVH is apparent. He was hypertensive (SBP 180s) on interview. TnI WNLx 2. Of note, the patient had 5/10 chest pain on my exam, relieved to 2/10 after NTG SL x 2. Given his cardiac history, medical noncompliance, lack of risk factor modification and high pretest probability, concern is for unstable angina. Will proceed with ischemic evaluation in the form of diagnostic cardiac cath today. The main limiting factor going forward will be noncompliance i.e. Should he need PCI/stenting, DAPT uninterrupted for at least 1 month if BMS used would be mandatory. Keep NPO. NTG SL, morphine PRN. Agree with cycling troponins. Risk stratify with lipid panel, Hgb A1C. Check TSH. Low-dose ASA, BB, upgrade statin, NTG SL PRN. Smoking cessation counseling. Consider case management consult to assist with medication affordability. Further recommendations per interventionalist's findings. Of note, chest pain was R sided. No prior cholecystectomy. Could radiate from RUQ. If cath  unrevealing, consider GI/biliary etiologies.    Signed, R. Hurman Horn, PA-C 10/13/2013, 7:20 AM   Patient seen, examined. Available data reviewed. Agree with findings, assessment, and plan as outlined by Odella Aquas, PA-C. The patient is known to me from previous PCI procedures and outpatient followup. He hasn't been seen in the outpatient setting and at least 2-3 years. Unfortunately his medical care has been complicated by limited resources and noncompliance. He has undergone previous stenting of the left circumflex on 3  separate occasions. He presents now with symptoms concerning for unstable angina. He's had exertional dyspnea with low-level activity as well as chest pain and diaphoresis. Effects of resting symptoms with pain in his upper back that have felt like his previous heart attack. EKG is nondiagnostic and troponin levels are normal. Considering his continued poorly controlled risk factors, typical history of unstable angina, and crescendo pattern of symptoms, recommend proceeding directly with diagnostic cardiac catheterization and possible PCI. I have reviewed the risks, indications, and alternatives to this approach. The patient understands and agrees to proceed. At his request, I called his mother and discussed our plan with her as well.  Tonny Bollman, M.D. 10/13/2013 9:51 AM

## 2013-10-22 ENCOUNTER — Emergency Department (HOSPITAL_COMMUNITY)
Admission: EM | Admit: 2013-10-22 | Discharge: 2013-10-22 | Disposition: A | Discharge status: Home / Self Care | Payer: Medicaid Other | Attending: Emergency Medicine | Admitting: Emergency Medicine

## 2013-10-22 ENCOUNTER — Encounter: Payer: Self-pay | Admitting: Neurology

## 2013-10-22 ENCOUNTER — Emergency Department (HOSPITAL_COMMUNITY): Payer: Medicaid Other

## 2013-10-22 ENCOUNTER — Encounter (HOSPITAL_COMMUNITY): Payer: Self-pay | Admitting: Emergency Medicine

## 2013-10-22 DIAGNOSIS — E785 Hyperlipidemia, unspecified: Secondary | ICD-10-CM | POA: Insufficient documentation

## 2013-10-22 DIAGNOSIS — R569 Unspecified convulsions: Secondary | ICD-10-CM

## 2013-10-22 DIAGNOSIS — G40909 Epilepsy, unspecified, not intractable, without status epilepticus: Secondary | ICD-10-CM | POA: Insufficient documentation

## 2013-10-22 DIAGNOSIS — Z9861 Coronary angioplasty status: Secondary | ICD-10-CM | POA: Insufficient documentation

## 2013-10-22 DIAGNOSIS — R05 Cough: Secondary | ICD-10-CM | POA: Insufficient documentation

## 2013-10-22 DIAGNOSIS — R079 Chest pain, unspecified: Secondary | ICD-10-CM

## 2013-10-22 DIAGNOSIS — R0789 Other chest pain: Secondary | ICD-10-CM | POA: Insufficient documentation

## 2013-10-22 DIAGNOSIS — R059 Cough, unspecified: Secondary | ICD-10-CM | POA: Insufficient documentation

## 2013-10-22 DIAGNOSIS — Z7982 Long term (current) use of aspirin: Secondary | ICD-10-CM | POA: Insufficient documentation

## 2013-10-22 DIAGNOSIS — R51 Headache: Secondary | ICD-10-CM | POA: Insufficient documentation

## 2013-10-22 DIAGNOSIS — F17200 Nicotine dependence, unspecified, uncomplicated: Secondary | ICD-10-CM | POA: Insufficient documentation

## 2013-10-22 DIAGNOSIS — Z79899 Other long term (current) drug therapy: Secondary | ICD-10-CM | POA: Insufficient documentation

## 2013-10-22 DIAGNOSIS — I209 Angina pectoris, unspecified: Secondary | ICD-10-CM | POA: Insufficient documentation

## 2013-10-22 DIAGNOSIS — Z862 Personal history of diseases of the blood and blood-forming organs and certain disorders involving the immune mechanism: Secondary | ICD-10-CM | POA: Insufficient documentation

## 2013-10-22 DIAGNOSIS — I2510 Atherosclerotic heart disease of native coronary artery without angina pectoris: Secondary | ICD-10-CM | POA: Insufficient documentation

## 2013-10-22 DIAGNOSIS — I10 Essential (primary) hypertension: Secondary | ICD-10-CM | POA: Insufficient documentation

## 2013-10-22 LAB — HEPATIC FUNCTION PANEL
ALT: 48 U/L (ref 0–53)
AST: 21 U/L (ref 0–37)
Albumin: 3.7 g/dL (ref 3.5–5.2)
Alkaline Phosphatase: 80 U/L (ref 39–117)
Bilirubin, Direct: <0.2 mg/dL (ref 0.0–0.3)
TOTAL PROTEIN: 7.7 g/dL (ref 6.0–8.3)
Total Bilirubin: <0.2 mg/dL — ABNORMAL LOW (ref 0.3–1.2)

## 2013-10-22 LAB — CBC WITH DIFFERENTIAL/PLATELET
BASOS ABS: 0 10*3/uL (ref 0.0–0.1)
Basophils Relative: 0 % (ref 0–1)
EOS ABS: 0.3 10*3/uL (ref 0.0–0.7)
EOS PCT: 3 % (ref 0–5)
HCT: 33.7 % — ABNORMAL LOW (ref 39.0–52.0)
Hemoglobin: 11.4 g/dL — ABNORMAL LOW (ref 13.0–17.0)
Lymphocytes Relative: 24 % (ref 12–46)
Lymphs Abs: 2.2 10*3/uL (ref 0.7–4.0)
MCH: 29.3 pg (ref 26.0–34.0)
MCHC: 33.8 g/dL (ref 30.0–36.0)
MCV: 86.6 fL (ref 78.0–100.0)
Monocytes Absolute: 0.8 10*3/uL (ref 0.1–1.0)
Monocytes Relative: 9 % (ref 3–12)
Neutro Abs: 5.7 10*3/uL (ref 1.7–7.7)
Neutrophils Relative %: 63 % (ref 43–77)
Platelets: 383 10*3/uL (ref 150–400)
RBC: 3.89 MIL/uL — ABNORMAL LOW (ref 4.22–5.81)
RDW: 16.2 % — AB (ref 11.5–15.5)
WBC: 9 10*3/uL (ref 4.0–10.5)

## 2013-10-22 LAB — POCT I-STAT TROPONIN I: TROPONIN I, POC: 0 ng/mL (ref 0.00–0.08)

## 2013-10-22 LAB — BASIC METABOLIC PANEL
BUN: 13 mg/dL (ref 6–23)
CALCIUM: 8.9 mg/dL (ref 8.4–10.5)
CO2: 27 mEq/L (ref 19–32)
Chloride: 103 mEq/L (ref 96–112)
Creatinine, Ser: 1.04 mg/dL (ref 0.50–1.35)
GFR calc Af Amer: >90 mL/min (ref >90)
GFR calc non Af Amer: 81 mL/min — ABNORMAL LOW (ref >90)
Glucose, Bld: 96 mg/dL (ref 70–99)
Potassium: 4.5 mEq/L (ref 3.7–5.3)
Sodium: 142 mEq/L (ref 137–147)

## 2013-10-22 LAB — D-DIMER, QUANTITATIVE: D-Dimer, Quant: 0.36 ug/mL-FEU (ref 0.00–0.48)

## 2013-10-22 LAB — PHENOBARBITAL LEVEL: PHENOBARBITAL: 15.3 ug/mL (ref 15.0–40.0)

## 2013-10-22 MED ORDER — LEVETIRACETAM 500 MG PO TABS
500.0000 mg | ORAL_TABLET | Freq: Two times a day (BID) | ORAL | Status: DC
Start: 2013-10-22 — End: 2013-12-05

## 2013-10-22 MED ORDER — IBUPROFEN 800 MG PO TABS
800.0000 mg | ORAL_TABLET | Freq: Once | ORAL | Status: AC
  Administered 2013-10-22: 800 mg via ORAL
  Filled 2013-10-22: qty 1

## 2013-10-22 MED ORDER — SODIUM CHLORIDE 0.9 % IV SOLN
1000.0000 mg | Freq: Once | INTRAVENOUS | Status: AC
  Administered 2013-10-22: 1000 mg via INTRAVENOUS
  Filled 2013-10-22: qty 10

## 2013-10-22 NOTE — ED Provider Notes (Signed)
CSN: [id number]     Arrival date & time 10/22/13  [id number] History   First MD Initiated Contact with Patient 10/22/13 [phone]     Chief Complaint  Patient presents with  . Seizures  . Chest Pain   (Consider location/radiation/quality/duration/timing/severity/associated sxs/prior Treatment) HPI Comments: 53 year old male presents by EMS after having a seizure. The patient states that she's been having seizures daily for a long time. He is on phenobarbital has not missed any doses. He states his mom called EMS because he was "turning blue" after this most recent seizure this morning. Mom states he appeared gray towards the end of the seizure. She does not know how long the seizure was. Patient denies any neck stiffness or fevers. No focal weakness. He states he has a mild headache has been improving since the seizure. Patient also states she's been having right-sided chest pain for the last 2 days. He was here just over one week ago and had a cardiac cath that does not show any new cardiac blockage. Patient states that the right sided pain he is having is different than when he had to have the cath. He states he is having shortness of breath but it does hurt worse to breathe. He has had a low but of a cough but no productive cough. Denies any leg pain or leg swelling. Denies abdominal pain or vomiting. He states a cath is like a pressure it radiates up to his right shoulder and then back down to his right lower chest. Occasionally does worsen as sharp pain. He states he's been having this pain constantly over the last 48 hours. He was given 4 baby aspirin by EMS.   Past Medical History  Diagnosis Date  . CAD (coronary artery disease)     a. Overlapping BMS-prox LCx in 2009 b. NSTEMI 2012 due to prox LCx ISR s/p PTCA alone; EF 45% w/ basal inf HK. Had run out of Plavix 1 week prior.  [name] HLD (hyperlipidemia)   . Seizure disorder   . Angina   . Hypertension   . TOBACCO ABUSE 02/22/2009  . Clotting disorder   .  Seizures   . Structural heart disease     a. Echo 03/2011: EF 50%, mild LVH, basal-mid posterior and basal inferior wall HK, mild AI, mod MR, mild biatrial enlargement, normal RV size/function, PASP    Past Surgical History  Procedure Laterality Date  . Coronary angioplasty with stent placement  10/2007    BMS-prox LCx  . Coronary angioplasty  03/2011    PTCA-prox LCx ISR   Family History  Problem Relation Age of Onset  .      History  Substance Use Topics  . Smoking status: Current Every Day Smoker -- 1.00 packs/day for 35 years    Types: Cigars  . Smokeless tobacco: Never Used  . Alcohol Use: No    Review of Systems  Constitutional: Negative for fever and chills.  Eyes: Negative for visual disturbance.  Respiratory: Positive for cough. Negative for shortness of breath.   Cardiovascular: Positive for chest pain. Negative for leg swelling.  Gastrointestinal: Negative for nausea, vomiting and abdominal pain.  Neurological: Positive for seizures and headaches. Negative for dizziness, weakness and light-headedness.  All other systems reviewed and are negative.    Allergies  Food allergy formula  Home Medications   Current Outpatient Rx  Name  Route  Sig  Dispense  Refill  . AMBULATORY NON FORMULARY MEDICATION      Prosvent prostate  health Take 1 softgel once daily         . aspirin 325 MG tablet   Oral   Take 325 mg by mouth daily.         [name] dextromethorphan (ROBITUSSIN CHILDRENS COUGH LA) 7.5 MG/5ML SYRP   Oral   Take 15 mg by mouth every 6 (six) hours as needed.         . metoprolol tartrate (LOPRESSOR) 25 MG tablet   Oral   Take 1 tablet (25 mg total) by mouth 2 (two) times daily.   60 tablet   2   . nitroGLYCERIN (NITROSTAT) 0.4 MG SL tablet   Sublingual   Place 1 tablet (0.4 mg total) under the tongue every 5 (five) minutes as needed.   30 tablet   0   . Omega-3 Fatty Acids (FISH OIL PO)   Oral   Take 1 capsule by mouth daily.         [name]  PHENObarbital (LUMINAL) 32.4 MG tablet   Oral   Take 2 tablets (64.8 mg total) by mouth 2 (two) times daily.   60 tablet   1   . pravastatin (PRAVACHOL) 20 MG tablet   Oral   Take 20 mg by mouth daily.          SpO2 97% Physical Exam  Nursing note and vitals reviewed. Constitutional: He is oriented to person, place, and time. He appears well-developed and well-nourished. No distress.  HENT:  Head: Normocephalic and atraumatic.  Right Ear: External ear normal.  Left Ear: External ear normal.  Nose: Nose normal.  Multiple teeth missing  Eyes: EOM are normal. Pupils are equal, round, and reactive to light. Right eye exhibits no discharge. Left eye exhibits no discharge.  Neck: Normal range of motion. Neck supple.  Cardiovascular: Normal rate, regular rhythm, normal heart sounds and intact distal pulses.   Pulmonary/Chest: Effort normal and breath sounds normal. He exhibits no tenderness.  Abdominal: Soft. He exhibits no distension. There is no tenderness.  Musculoskeletal: He exhibits no edema.  Neurological: He is alert and oriented to person, place, and time. He has normal strength. No cranial nerve deficit or sensory deficit. GCS eye subscore is 4. GCS verbal subscore is 5. GCS motor subscore is 6.  CN 2-12 grossly intact. 5/5 strength in all 4 extremities  Skin: Skin is warm and dry.    ED Course  Procedures (including critical care time) Labs Review Labs Reviewed  CBC WITH DIFFERENTIAL - Abnormal; Notable for the following:    RBC 3.89 (*)    Hemoglobin 11.4 (*)    HCT 33.7 (*)    RDW 16.2 (*)    All other components within normal limits  BASIC METABOLIC PANEL - Abnormal; Notable for the following:    GFR calc non Af Amer 81 (*)    All other components within normal limits  HEPATIC FUNCTION PANEL - Abnormal; Notable for the following:    Total Bilirubin <0.2 (*)    All other components within normal limits  PHENOBARBITAL LEVEL  D-DIMER, QUANTITATIVE  POCT I-STAT  TROPONIN I   Imaging Review Dg Chest Port 1 View  10/22/2013   CLINICAL DATA:  Right-sided chest pain  EXAM: PORTABLE CHEST - 1 VIEW  COMPARISON:  10/12/2013  FINDINGS: Stable cardiomegaly is noted. The lungs are well aerated bilaterally without focal infiltrate or sizable effusion. No acute bony abnormality is seen.  IMPRESSION: No active disease.   Electronically Signed   By: Elta Guadeloupe  Lukens M.D.   On: 10/22/2013 08:38    EKG Interpretation    Date/Time:  Friday October 22 2013 07:55:15 EST Ventricular Rate:  88 PR Interval:  165 QRS Duration: 98 QT Interval:  372 QTC Calculation: 450 R Axis:   2 Text Interpretation:  Normal sinus rhythm Abnormal R-wave progression, early transition No significant change since last tracing Confirmed by Merwyn Hodapp  MD, Shiv Shuey ([id number]) on 10/22/2013 8:03:06 AM            MDM   1. Chest pain   2. Seizure    Chest pain does not sound cardiac. EKG is unchanged from prior and had a stable cath less than 2 weeks ago. Is low risk for PE, and with neg ddimer feel he does not need further w/u. Unlikely to be dissection with benign appearing xray and well appearance of patient. As for seizure, the patient seems to have daily seizures. He does have a history of noncompliance, however at this time he has a therapeutic (low therapeutic) phenobarbital level. I discussed with the neurologist on-call Dr. Aram Beecham, who recommends getting an IV dose of Keppra and then starting on Keppra twice a day in addition to his phenobarbital since he is having daily seizures even while on phenobarbital.. No seizure activity while here, and he has a normal neurologic exam.    Ephraim Hamburger, MD 10/22/13 1139

## 2013-10-22 NOTE — ED Notes (Signed)
To ED via [place] from home (lives with mom) c/o seizure activity at home. Also states has right sided abd pain radiating to right sided chest. Alert/oriented x 4.

## 2013-10-22 NOTE — ED Notes (Signed)
Mother states seizure activity today lasted approx 30 seconds-- with pt turning blue. Mother also states is having seizures every day, petite mal -- with arm shaking, makes a noise, multiple times a day.

## 2013-10-22 NOTE — Progress Notes (Signed)
   CARE MANAGEMENT ED NOTE 10/22/2013  Patient:  [name], [name]   Account Number:  [id number]  Date Initiated:  10/22/2013  Documentation initiated by:  Jackelyn Poling  Subjective/Objective Assessment:   53 yr old male medicaid of [place] pt Cm consult for medicaton needs Cm called by [facility] [place] and Santiago Glad [facility] ED RN CM reviewed with them that pt is not eligible for [facility] MATCH program Pt has discounted meds because of his medicaid coverage     Subjective/Objective Assessment Detail:   Pt informed ED Rn he was having trouble with his keppra, wanting cheaper medication and filing for disability     Action/Plan:   CM spoke with [facility] ED RN to offer resources for pt   Action/Plan Detail:   see blow notes   Anticipated DC Date:  10/22/2013     Status Recommendation to Physician:   Result of Recommendation:    Other ED Services  Consult Working [place]  Other  Medication Assistance  Outpatient Services - Pt will follow up    Choice offered to / List presented to:            Status of service:  Completed, signed off  ED Comments:   ED Comments Detail:  CM discussed and provided  information for www.needymeds.org, financial assistance such as [address] offers $50 copy on mondays, first come firs serve basis [phone], Social security office or local attorney for disability filing, DSS. Santiago Glad to offer resources to the pt States pt lives with his mother PCp listed as alvin blount

## 2013-10-22 NOTE — Discharge Instructions (Signed)
Chest Pain (Nonspecific) °It is often hard to give a specific diagnosis for the cause of chest pain. There is always a chance that your pain could be related to something serious, such as a heart attack or a blood clot in the lungs. You need to follow up with your caregiver for further evaluation. °CAUSES  °· Heartburn. °· Pneumonia or bronchitis. °· Anxiety or stress. °· Inflammation around your heart (pericarditis) or lung (pleuritis or pleurisy). °· A blood clot in the lung. °· A collapsed lung (pneumothorax). It can develop suddenly on its own (spontaneous pneumothorax) or from injury (trauma) to the chest. °· Shingles infection (herpes zoster virus). °The chest wall is composed of bones, muscles, and cartilage. Any of these can be the source of the pain. °· The bones can be bruised by injury. °· The muscles or cartilage can be strained by coughing or overwork. °· The cartilage can be affected by inflammation and become sore (costochondritis). °DIAGNOSIS  °Lab tests or other studies, such as X-rays, electrocardiography, stress testing, or cardiac imaging, may be needed to find the cause of your pain.  °TREATMENT  °· Treatment depends on what may be causing your chest pain. Treatment may include: °· Acid blockers for heartburn. °· Anti-inflammatory medicine. °· Pain medicine for inflammatory conditions. °· Antibiotics if an infection is present. °· You may be advised to change lifestyle habits. This includes stopping smoking and avoiding alcohol, caffeine, and chocolate. °· You may be advised to keep your head raised (elevated) when sleeping. This reduces the chance of acid going backward from your stomach into your esophagus. °· Most of the time, nonspecific chest pain will improve within 2 to 3 days with rest and mild pain medicine. °HOME CARE INSTRUCTIONS  °· If antibiotics were prescribed, take your antibiotics as directed. Finish them even if you start to feel better. °· For the next few days, avoid physical  activities that bring on chest pain. Continue physical activities as directed. °· Do not smoke. °· Avoid drinking alcohol. °· Only take over-the-counter or prescription medicine for pain, discomfort, or fever as directed by your caregiver. °· Follow your caregiver's suggestions for further testing if your chest pain does not go away. °· Keep any follow-up appointments you made. If you do not go to an appointment, you could develop lasting (chronic) problems with pain. If there is any problem keeping an appointment, you must call to reschedule. °SEEK MEDICAL CARE IF:  °· You think you are having problems from the medicine you are taking. Read your medicine instructions carefully. °· Your chest pain does not go away, even after treatment. °· You develop a rash with blisters on your chest. °SEEK IMMEDIATE MEDICAL CARE IF:  °· You have increased chest pain or pain that spreads to your arm, neck, jaw, back, or abdomen. °· You develop shortness of breath, an increasing cough, or you are coughing up blood. °· You have severe back or abdominal pain, feel nauseous, or vomit. °· You develop severe weakness, fainting, or chills. °· You have a fever. °THIS IS AN EMERGENCY. Do not wait to see if the pain will go away. Get medical help at once. Call your local emergency services (911 in [place] Do not drive yourself to the hospital. °MAKE SURE YOU:  °· Understand these instructions. °· Will watch your condition. °· Will get help right away if you are not doing well or get worse. °Document Released: 07/17/2005 Document Revised: 12/30/2011 Document Reviewed: 05/12/2008 °ExitCare® Patient Information ©2014 [employer]   [employer]. ° °

## 2013-10-22 NOTE — ED Notes (Signed)
Spoke with Alwyn Ren RN Case manager from [facility] ED regarding patient's concerns about affordability of medications. Resources received and given to pt's mother. Pt is on medicaid--and is attempting to obtain disability at this time also. Information also given on Keppra.

## 2013-11-23 ENCOUNTER — Other Ambulatory Visit: Payer: Self-pay | Admitting: Internal Medicine

## 2013-12-01 ENCOUNTER — Encounter: Payer: Self-pay | Admitting: Physician Assistant

## 2013-12-01 ENCOUNTER — Ambulatory Visit (INDEPENDENT_AMBULATORY_CARE_PROVIDER_SITE_OTHER): Payer: Medicaid Other | Admitting: Physician Assistant

## 2013-12-01 VITALS — BP 160/110 | HR 56 | Ht 74.0 in | Wt 201.0 lb

## 2013-12-01 DIAGNOSIS — I10 Essential (primary) hypertension: Secondary | ICD-10-CM

## 2013-12-01 DIAGNOSIS — I255 Ischemic cardiomyopathy: Secondary | ICD-10-CM

## 2013-12-01 DIAGNOSIS — I2510 Atherosclerotic heart disease of native coronary artery without angina pectoris: Secondary | ICD-10-CM

## 2013-12-01 DIAGNOSIS — I2589 Other forms of chronic ischemic heart disease: Secondary | ICD-10-CM

## 2013-12-01 DIAGNOSIS — E785 Hyperlipidemia, unspecified: Secondary | ICD-10-CM

## 2013-12-01 DIAGNOSIS — F17200 Nicotine dependence, unspecified, uncomplicated: Secondary | ICD-10-CM

## 2013-12-01 MED ORDER — CARVEDILOL 6.25 MG PO TABS: 6.2500 mg | ORAL_TABLET | Freq: Two times a day (BID) | ORAL | Status: DC

## 2013-12-01 MED ORDER — LISINOPRIL 10 MG PO TABS: 10.0000 mg | ORAL_TABLET | Freq: Every day | ORAL | Status: DC

## 2013-12-01 NOTE — Progress Notes (Signed)
[address] [place], [place]  [address] Phone: [phone] Fax:  [phone]  Date:  12/01/2013   ID:  [name], DOB [birth year], MRN [id number]  PCP:  Elizabeth Palau, MD  Cardiologist:  Dr. Sherren Mocha     History of Present Illness: [name] is a 53 y.o. male with a history of CAD status post BMS x 2 to the prox CFX in 2009, non-STEMI 2012 due to ISR of CFX stents (off Plavix) treated with angioplasty, ischemic cardiomyopathy, HTN, HL, seizure disorder, reported "clotting disorder," tobacco abuse, noncompliance, poor dentition. He was seen in the hospital in 09/2013 for chest pain. Cardiac markers were normal. Symptoms were felt to be consistent with unstable angina and he underwent cardiac catheterization. This demonstrated patent stents and nonobstructive disease elsewhere. Continued, aggressive, medical therapy was recommended.  LHC (10/13/13):  dLM 50, mLAD 40-50, oCFX 50, RI 70-75 (unchanged from prior study), mCFX patent with 30% ISR, then 30-40, pRCA occluded (left-right collaterals), EF 40% with inferior AK. IVUS of the mid LAD without significant obstructive lesion.  Since discharge, he has been doing well. He has had one episode of chest pain. This occurred while walking. He has had no recurrence. He denies any exertional chest symptoms. He denies significant dyspnea. He is NYHA class II. He denies orthopnea, PND or edema. He denies syncope.  Recent Labs: 10/22/2013: ALT 48; Creatinine 1.04; Hemoglobin 11.4*; Potassium 4.5   Wt Readings from Last 3 Encounters:  12/01/13 201 lb (91.173 kg)  09/07/13 200 lb (90.719 kg)  08/27/13 200 lb 6 oz (90.89 kg)     Past Medical History  Diagnosis Date  . CAD (coronary artery disease)     a. Overlapping BMS-prox LCx in 2009 b. NSTEMI 2012 due to prox LCx ISR s/p PTCA alone; EF 45% w/ basal inf HK. Had run out of Plavix 1 week prior.  [name] HLD (hyperlipidemia)   . Seizure disorder   . Angina   .  Hypertension   . TOBACCO ABUSE 02/22/2009  . Clotting disorder   . Seizures   . Structural heart disease     a. Echo 03/2011: EF 50%, mild LVH, basal-mid posterior and basal inferior wall HK, mild AI, mod MR, mild biatrial enlargement, normal RV size/function, PASP     Current Outpatient Prescriptions  Medication Sig Dispense Refill  . AMBULATORY NON FORMULARY MEDICATION Prosvent prostate health Take 1 softgel once daily      . aspirin 325 MG tablet Take 325 mg by mouth daily.      [name] levETIRAcetam (KEPPRA) 500 MG tablet Take 1 tablet (500 mg total) by mouth 2 (two) times daily.  60 tablet  0  . metoprolol tartrate (LOPRESSOR) 25 MG tablet Take 1 tablet (25 mg total) by mouth 2 (two) times daily.  60 tablet  2  . nitroGLYCERIN (NITROSTAT) 0.4 MG SL tablet Place 1 tablet (0.4 mg total) under the tongue every 5 (five) minutes as needed.  30 tablet  0  . Omega-3 Fatty Acids (FISH OIL PO) Take 1 capsule by mouth daily.      [name] PHENObarbital (LUMINAL) 32.4 MG tablet Take 2 tablets (64.8 mg total) by mouth 2 (two) times daily.  60 tablet  1  . pravastatin (PRAVACHOL) 20 MG tablet Take 20 mg by mouth daily.       No current facility-administered medications for this visit.    Allergies:   Food allergy formula   Social History:  The  patient  reports that he has been smoking Cigars.  He has never used smokeless tobacco. He reports that he does not drink alcohol or use illicit drugs.   Family History:  The patient's family history includes Coronary artery disease in his maternal aunt.   ROS:  Please see the history of present illness.   He denies headaches or blurred vision.   All other systems reviewed and negative.   PHYSICAL EXAM: VS:  BP 160/110  Pulse 56  Ht 6\' 2"  (1.88 m)  Wt 201 lb (91.173 kg)  BMI 25.80 kg/m2 Well nourished, well developed, in no acute distress HEENT: normal Neck: no JVD Cardiac:  normal S1, S2; RRR; no murmur Lungs:  clear to auscultation bilaterally, no wheezing,  rhonchi or rales Abd: soft, nontender, no hepatomegaly Ext: no edemaright wrist without hematoma or mass  Skin: warm and dry Neuro:  CNs 2-12 intact, no focal abnormalities noted  EKG:  Sinus bradycardia, HR 56, normal axis, no acute changes     ASSESSMENT AND PLAN:  1. CAD: Overall stable without significant angina. Continue aspirin, statin, beta blocker. 2. Hypertension: Uncontrolled. DC metoprolol tartrate. Start carvedilol 6.25 mg twice a day. Start lisinopril 10 mg daily. Check a basic metabolic panel in one week. 3. Ischemic cardiomyopathy: Initiate ACEI. Change beta blocker to carvedilol. Consider adding spironolactone if his blood pressure remains difficult to control. Ejection fraction has remained > 35%.  Volume is stable. 4. Hyperlipidemia: Continue statin.  Arrange follow up lipids and LFTs. 5. Seizure disorder: Continue follow up with neurology. 6. Tobacco abuse: He has been advised to quit. 7. Disposition:  Follow up with me in 2-3 weeks.  Signed, Kevin Dopp, PA-C  12/01/2013 3:47 PM

## 2013-12-01 NOTE — Patient Instructions (Addendum)
STOP METOPROLOL  START COREG 6.25 TWICE DAILY; YOU WILL START THIS ON 12/02/13 START LISINOPRIL 10 MG DAILY; YOU WILL START THIS ON 12/03/13  LAB WORK 1 WEEK; BMET, FASTING LIPID AND LIVER PANEL   Your physician recommends that you schedule a follow-up appointment in: 2-3 [place], [facility]

## 2013-12-02 ENCOUNTER — Other Ambulatory Visit: Payer: Self-pay | Admitting: Internal Medicine

## 2013-12-05 ENCOUNTER — Emergency Department (HOSPITAL_COMMUNITY)
Admission: EM | Admit: 2013-12-05 | Discharge: 2013-12-05 | Disposition: A | Discharge status: Home / Self Care | Payer: No Typology Code available for payment source | Attending: Emergency Medicine | Admitting: Emergency Medicine

## 2013-12-05 ENCOUNTER — Encounter (HOSPITAL_COMMUNITY): Payer: Self-pay | Admitting: Emergency Medicine

## 2013-12-05 DIAGNOSIS — T148XXA Other injury of unspecified body region, initial encounter: Secondary | ICD-10-CM

## 2013-12-05 DIAGNOSIS — IMO0002 Reserved for concepts with insufficient information to code with codable children: Secondary | ICD-10-CM | POA: Insufficient documentation

## 2013-12-05 DIAGNOSIS — Y939 Activity, unspecified: Secondary | ICD-10-CM | POA: Insufficient documentation

## 2013-12-05 DIAGNOSIS — Z91018 Allergy to other foods: Secondary | ICD-10-CM | POA: Insufficient documentation

## 2013-12-05 DIAGNOSIS — E785 Hyperlipidemia, unspecified: Secondary | ICD-10-CM | POA: Insufficient documentation

## 2013-12-05 DIAGNOSIS — F17200 Nicotine dependence, unspecified, uncomplicated: Secondary | ICD-10-CM | POA: Insufficient documentation

## 2013-12-05 DIAGNOSIS — I2510 Atherosclerotic heart disease of native coronary artery without angina pectoris: Secondary | ICD-10-CM | POA: Insufficient documentation

## 2013-12-05 DIAGNOSIS — D689 Coagulation defect, unspecified: Secondary | ICD-10-CM | POA: Insufficient documentation

## 2013-12-05 DIAGNOSIS — I519 Heart disease, unspecified: Secondary | ICD-10-CM | POA: Insufficient documentation

## 2013-12-05 DIAGNOSIS — Y92410 Unspecified street and highway as the place of occurrence of the external cause: Secondary | ICD-10-CM | POA: Insufficient documentation

## 2013-12-05 DIAGNOSIS — R209 Unspecified disturbances of skin sensation: Secondary | ICD-10-CM | POA: Insufficient documentation

## 2013-12-05 DIAGNOSIS — G40909 Epilepsy, unspecified, not intractable, without status epilepticus: Secondary | ICD-10-CM | POA: Insufficient documentation

## 2013-12-05 DIAGNOSIS — Z79899 Other long term (current) drug therapy: Secondary | ICD-10-CM | POA: Insufficient documentation

## 2013-12-05 DIAGNOSIS — Z7982 Long term (current) use of aspirin: Secondary | ICD-10-CM | POA: Insufficient documentation

## 2013-12-05 DIAGNOSIS — Z8679 Personal history of other diseases of the circulatory system: Secondary | ICD-10-CM | POA: Insufficient documentation

## 2013-12-05 DIAGNOSIS — I252 Old myocardial infarction: Secondary | ICD-10-CM | POA: Insufficient documentation

## 2013-12-05 DIAGNOSIS — I10 Essential (primary) hypertension: Secondary | ICD-10-CM | POA: Insufficient documentation

## 2013-12-05 HISTORY — DX: Acute myocardial infarction, unspecified: I21.9

## 2013-12-05 MED ORDER — METHOCARBAMOL 500 MG PO TABS: 1000.0000 mg | ORAL_TABLET | Freq: Four times a day (QID) | ORAL | Status: DC
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
## 2013-12-05 NOTE — ED Notes (Signed)
Pt c/o soreness in left upper rib area.  Also st's he had pain in lower back but pain has subsided and now feels sore.  Also c/o pain in between shoulder blades.
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
## 2013-12-06 NOTE — ED Provider Notes (Signed)
Medical screening examination/treatment/procedure(s) were performed by non-physician practitioner and as supervising physician I was immediately available for consultation/collaboration.  EKG Interpretation   None         Ralphael Southgate N Jarvin Ogren, DO 12/06/13 0234 

## 2013-12-08 ENCOUNTER — Encounter: Payer: Self-pay | Admitting: Internal Medicine

## 2013-12-08 ENCOUNTER — Ambulatory Visit: Payer: No Typology Code available for payment source | Attending: Internal Medicine | Admitting: Internal Medicine

## 2013-12-08 VITALS — BP 119/79 | HR 80 | Temp 98.9°F | Resp 14 | Ht 74.0 in | Wt 197.4 lb

## 2013-12-08 DIAGNOSIS — I10 Essential (primary) hypertension: Secondary | ICD-10-CM | POA: Insufficient documentation

## 2013-12-08 DIAGNOSIS — M542 Cervicalgia: Secondary | ICD-10-CM | POA: Insufficient documentation

## 2013-12-08 DIAGNOSIS — M545 Low back pain, unspecified: Secondary | ICD-10-CM | POA: Insufficient documentation

## 2013-12-08 DIAGNOSIS — T148XXA Other injury of unspecified body region, initial encounter: Secondary | ICD-10-CM

## 2013-12-08 DIAGNOSIS — E785 Hyperlipidemia, unspecified: Secondary | ICD-10-CM | POA: Insufficient documentation

## 2013-12-08 DIAGNOSIS — I2510 Atherosclerotic heart disease of native coronary artery without angina pectoris: Secondary | ICD-10-CM

## 2013-12-08 DIAGNOSIS — F17200 Nicotine dependence, unspecified, uncomplicated: Secondary | ICD-10-CM | POA: Insufficient documentation

## 2013-12-08 DIAGNOSIS — IMO0002 Reserved for concepts with insufficient information to code with codable children: Secondary | ICD-10-CM | POA: Insufficient documentation
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
## 2013-12-09 ENCOUNTER — Emergency Department (HOSPITAL_COMMUNITY)
Admission: EM | Admit: 2013-12-09 | Discharge: 2013-12-09 | Disposition: A | Discharge status: Home / Self Care | Payer: Medicaid Other | Attending: Emergency Medicine | Admitting: Emergency Medicine

## 2013-12-09 ENCOUNTER — Other Ambulatory Visit: Payer: Medicaid Other

## 2013-12-09 ENCOUNTER — Emergency Department (HOSPITAL_COMMUNITY): Payer: Medicaid Other

## 2013-12-09 ENCOUNTER — Encounter (HOSPITAL_COMMUNITY): Payer: Self-pay | Admitting: Emergency Medicine

## 2013-12-09 DIAGNOSIS — I209 Angina pectoris, unspecified: Secondary | ICD-10-CM | POA: Insufficient documentation

## 2013-12-09 DIAGNOSIS — G8911 Acute pain due to trauma: Secondary | ICD-10-CM | POA: Insufficient documentation

## 2013-12-09 DIAGNOSIS — Z9861 Coronary angioplasty status: Secondary | ICD-10-CM | POA: Insufficient documentation

## 2013-12-09 DIAGNOSIS — Z79899 Other long term (current) drug therapy: Secondary | ICD-10-CM | POA: Insufficient documentation

## 2013-12-09 DIAGNOSIS — Z7982 Long term (current) use of aspirin: Secondary | ICD-10-CM | POA: Insufficient documentation

## 2013-12-09 DIAGNOSIS — R0789 Other chest pain: Secondary | ICD-10-CM

## 2013-12-09 DIAGNOSIS — G40909 Epilepsy, unspecified, not intractable, without status epilepticus: Secondary | ICD-10-CM | POA: Insufficient documentation

## 2013-12-09 DIAGNOSIS — R071 Chest pain on breathing: Secondary | ICD-10-CM | POA: Insufficient documentation

## 2013-12-09 DIAGNOSIS — I252 Old myocardial infarction: Secondary | ICD-10-CM | POA: Insufficient documentation

## 2013-12-09 DIAGNOSIS — E785 Hyperlipidemia, unspecified: Secondary | ICD-10-CM | POA: Insufficient documentation

## 2013-12-09 DIAGNOSIS — I10 Essential (primary) hypertension: Secondary | ICD-10-CM | POA: Insufficient documentation

## 2013-12-09 DIAGNOSIS — I2510 Atherosclerotic heart disease of native coronary artery without angina pectoris: Secondary | ICD-10-CM | POA: Insufficient documentation

## 2013-12-09 DIAGNOSIS — F17200 Nicotine dependence, unspecified, uncomplicated: Secondary | ICD-10-CM | POA: Insufficient documentation

## 2013-12-09 DIAGNOSIS — Z862 Personal history of diseases of the blood and blood-forming organs and certain disorders involving the immune mechanism: Secondary | ICD-10-CM | POA: Insufficient documentation

## 2013-12-09 LAB — CBC WITH DIFFERENTIAL/PLATELET
BASOS ABS: 0 10*3/uL (ref 0.0–0.1)
BASOS PCT: 0 % (ref 0–1)
EOS ABS: 0.3 10*3/uL (ref 0.0–0.7)
EOS PCT: 4 % (ref 0–5)
HCT: 35.7 % — ABNORMAL LOW (ref 39.0–52.0)
Hemoglobin: 12 g/dL — ABNORMAL LOW (ref 13.0–17.0)
Lymphocytes Relative: 35 % (ref 12–46)
Lymphs Abs: 2.1 10*3/uL (ref 0.7–4.0)
MCH: 29.5 pg (ref 26.0–34.0)
MCHC: 33.6 g/dL (ref 30.0–36.0)
MCV: 87.7 fL (ref 78.0–100.0)
Monocytes Absolute: 0.6 10*3/uL (ref 0.1–1.0)
Monocytes Relative: 10 % (ref 3–12)
Neutro Abs: 3 10*3/uL (ref 1.7–7.7)
Neutrophils Relative %: 51 % (ref 43–77)
PLATELETS: 391 10*3/uL (ref 150–400)
RBC: 4.07 MIL/uL — ABNORMAL LOW (ref 4.22–5.81)
RDW: 15.4 % (ref 11.5–15.5)
WBC: 6 10*3/uL (ref 4.0–10.5)

## 2013-12-09 LAB — URINALYSIS, ROUTINE W REFLEX MICROSCOPIC
Bilirubin Urine: NEGATIVE
Glucose, UA: NEGATIVE mg/dL
Ketones, ur: 15 mg/dL — AB
LEUKOCYTES UA: NEGATIVE
Nitrite: NEGATIVE
Protein, ur: NEGATIVE mg/dL
SPECIFIC GRAVITY, URINE: 1.01 (ref 1.005–1.030)
UROBILINOGEN UA: 1 mg/dL (ref 0.0–1.0)
pH: 5.5 (ref 5.0–8.0)

## 2013-12-09 LAB — COMPREHENSIVE METABOLIC PANEL
ALT: 12 U/L (ref 0–53)
AST: 14 U/L (ref 0–37)
Albumin: 4.1 g/dL (ref 3.5–5.2)
Alkaline Phosphatase: 103 U/L (ref 39–117)
BUN: 14 mg/dL (ref 6–23)
CALCIUM: 9.5 mg/dL (ref 8.4–10.5)
CO2: 26 mEq/L (ref 19–32)
CREATININE: 1.01 mg/dL (ref 0.50–1.35)
Chloride: 97 mEq/L (ref 96–112)
GFR calc Af Amer: >90 mL/min (ref >90)
GFR calc non Af Amer: 84 mL/min — ABNORMAL LOW (ref >90)
Glucose, Bld: 90 mg/dL (ref 70–99)
Potassium: 4.1 mEq/L (ref 3.7–5.3)
SODIUM: 136 meq/L — AB (ref 137–147)
TOTAL PROTEIN: 8.4 g/dL — AB (ref 6.0–8.3)
Total Bilirubin: 0.3 mg/dL (ref 0.3–1.2)

## 2013-12-09 LAB — URINE MICROSCOPIC-ADD ON

## 2013-12-09 LAB — TROPONIN I: Troponin I: <0.3 ng/mL (ref <0.30)

## 2013-12-09 MED ORDER — HYDROCODONE-ACETAMINOPHEN 5-325 MG PO TABS: 2.0000 | ORAL_TABLET | ORAL | Status: DC | PRN

## 2013-12-09 MED ORDER — ONDANSETRON 4 MG PO TBDP
4.0000 mg | ORAL_TABLET | Freq: Once | ORAL | Status: AC
  Administered 2013-12-09: 4 mg via ORAL
  Filled 2013-12-09: qty 1

## 2013-12-09 MED ORDER — ONDANSETRON 4 MG PO TBDP: 4.0000 mg | ORAL_TABLET | Freq: Three times a day (TID) | ORAL | Status: DC | PRN

## 2013-12-09 MED ORDER — HYDROCODONE-ACETAMINOPHEN 5-325 MG PO TABS
1.0000 | ORAL_TABLET | Freq: Once | ORAL | Status: AC
  Administered 2013-12-09: 1 via ORAL
  Filled 2013-12-09: qty 1
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
## 2013-12-09 NOTE — Discharge Instructions (Signed)
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
## 2013-12-12 ENCOUNTER — Encounter (HOSPITAL_COMMUNITY): Payer: Self-pay | Admitting: Emergency Medicine

## 2013-12-12 ENCOUNTER — Emergency Department (HOSPITAL_COMMUNITY)
Admission: EM | Admit: 2013-12-12 | Discharge: 2013-12-12 | Disposition: A | Discharge status: Home / Self Care | Payer: No Typology Code available for payment source | Attending: Emergency Medicine | Admitting: Emergency Medicine

## 2013-12-12 ENCOUNTER — Emergency Department (HOSPITAL_COMMUNITY): Payer: No Typology Code available for payment source

## 2013-12-12 DIAGNOSIS — I209 Angina pectoris, unspecified: Secondary | ICD-10-CM | POA: Insufficient documentation

## 2013-12-12 DIAGNOSIS — Y9389 Activity, other specified: Secondary | ICD-10-CM | POA: Insufficient documentation

## 2013-12-12 DIAGNOSIS — S139XXA Sprain of joints and ligaments of unspecified parts of neck, initial encounter: Secondary | ICD-10-CM | POA: Insufficient documentation

## 2013-12-12 DIAGNOSIS — Z862 Personal history of diseases of the blood and blood-forming organs and certain disorders involving the immune mechanism: Secondary | ICD-10-CM | POA: Insufficient documentation

## 2013-12-12 DIAGNOSIS — Y92410 Unspecified street and highway as the place of occurrence of the external cause: Secondary | ICD-10-CM | POA: Insufficient documentation

## 2013-12-12 DIAGNOSIS — F17200 Nicotine dependence, unspecified, uncomplicated: Secondary | ICD-10-CM | POA: Insufficient documentation

## 2013-12-12 DIAGNOSIS — Z7982 Long term (current) use of aspirin: Secondary | ICD-10-CM | POA: Insufficient documentation

## 2013-12-12 DIAGNOSIS — S161XXA Strain of muscle, fascia and tendon at neck level, initial encounter: Secondary | ICD-10-CM

## 2013-12-12 DIAGNOSIS — Z79899 Other long term (current) drug therapy: Secondary | ICD-10-CM | POA: Insufficient documentation

## 2013-12-12 DIAGNOSIS — G40909 Epilepsy, unspecified, not intractable, without status epilepticus: Secondary | ICD-10-CM | POA: Insufficient documentation

## 2013-12-12 DIAGNOSIS — I2510 Atherosclerotic heart disease of native coronary artery without angina pectoris: Secondary | ICD-10-CM | POA: Insufficient documentation

## 2013-12-12 DIAGNOSIS — Z9861 Coronary angioplasty status: Secondary | ICD-10-CM | POA: Insufficient documentation

## 2013-12-12 DIAGNOSIS — I10 Essential (primary) hypertension: Secondary | ICD-10-CM | POA: Insufficient documentation

## 2013-12-12 DIAGNOSIS — E785 Hyperlipidemia, unspecified: Secondary | ICD-10-CM | POA: Insufficient documentation

## 2013-12-12 DIAGNOSIS — I252 Old myocardial infarction: Secondary | ICD-10-CM | POA: Insufficient documentation

## 2013-12-12 DIAGNOSIS — Z791 Long term (current) use of non-steroidal anti-inflammatories (NSAID): Secondary | ICD-10-CM | POA: Insufficient documentation

## 2013-12-12 MED ORDER — NAPROXEN 250 MG PO TABS
500.0000 mg | ORAL_TABLET | Freq: Once | ORAL | Status: AC
  Administered 2013-12-12: 500 mg via ORAL
  Filled 2013-12-12: qty 2

## 2013-12-12 MED ORDER — NAPROXEN 500 MG PO TABS: 500.0000 mg | ORAL_TABLET | Freq: Two times a day (BID) | ORAL | Status: DC

## 2013-12-12 NOTE — Discharge Instructions (Signed)
Your x-rays today were negative for fracture, dislocation, or instability. Recommend you take naproxen as prescribed for symptoms. Apply ice to your neck 3-4 times per day for at least 30 minutes each time. Followup with the primary care provider for further evaluation of your symptoms.  Muscle Strain A muscle strain is an injury that occurs when a muscle is stretched beyond its normal length. Usually a small number of muscle fibers are torn when this happens. Muscle strain is rated in degrees. First-degree strains have the least amount of muscle fiber tearing and pain. Second-degree and third-degree strains have increasingly more tearing and pain.  Usually, recovery from muscle strain takes 1 2 weeks. Complete healing takes 5 6 weeks.  CAUSES  Muscle strain happens when a sudden, violent force placed on a muscle stretches it too far. This may occur with lifting, sports, or a fall.  RISK FACTORS Muscle strain is especially common in athletes.  SIGNS AND SYMPTOMS At the site of the muscle strain, there may be:  Pain.  Bruising.  Swelling.  Difficulty using the muscle due to pain or lack of normal function. DIAGNOSIS  Your health care provider will perform a physical exam and ask about your medical history. TREATMENT  Often, the best treatment for a muscle strain is resting, icing, and applying cold compresses to the injured area.  HOME CARE INSTRUCTIONS   Use the PRICE method of treatment to promote muscle healing during the first 2 3 days after your injury. The PRICE method involves:  Protecting the muscle from being injured again.  Restricting your activity and resting the injured body part.  Icing your injury. To do this, put ice in a plastic bag. Place a towel between your skin and the bag. Then, apply the ice and leave it on from 15 20 minutes each hour. After the third day, switch to moist heat packs.  Apply compression to the injured area with a splint or elastic bandage. Be  careful not to wrap it too tightly. This may interfere with blood circulation or increase swelling.  Elevate the injured body part above the level of your heart as often as you can.  Only take over-the-counter or prescription medicines for pain, discomfort, or fever as directed by your health care provider.  Warming up prior to exercise helps to prevent future muscle strains. SEEK MEDICAL CARE IF:   You have increasing pain or swelling in the injured area.  You have numbness, tingling, or a significant loss of strength in the injured area. MAKE SURE YOU:   Understand these instructions.  Will watch your condition.  Will get help right away if you are not doing well or get worse. Document Released: 10/07/2005 Document Revised: 07/28/2013 Document Reviewed: 05/06/2013 [facility] Patient Information 2014 [place], [place].
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
## 2013-12-12 NOTE — ED Notes (Signed)
EKG completed and given to [place].  Pt is cleared to be d/c'd.

## 2013-12-12 NOTE — ED Notes (Signed)
Charge RN, [place], Security, and [employer] at bedside to assist with d/c of pt.  Pt instructions reviewed along with prescriptions and f/u care.  Pt verbalized understanding.

## 2013-12-12 NOTE — ED Notes (Signed)
Pt ambulatory from triage independently and with NAD.
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
## 2013-12-13 NOTE — ED Provider Notes (Signed)
Medical screening examination/treatment/procedure(s) were performed by non-physician practitioner and as supervising physician I was immediately available for consultation/collaboration.  EKG Interpretation    Date/Time:  Sunday December 12 2013 11:33:51 EST Ventricular Rate:  86 PR Interval:  152 QRS Duration: 90 QT Interval:  372 QTC Calculation: 445 R Axis:   9 Text Interpretation:  Normal sinus rhythm Minimal voltage criteria for LVH, may be normal variant No significant change since last tracing Confirmed by Siren Porrata  MD, [place] ([id number]) on 12/12/2013 11:40:36 AM              Blanchard Kelch, MD 12/13/13 [phone]

## 2013-12-15 ENCOUNTER — Other Ambulatory Visit: Payer: Self-pay | Admitting: Internal Medicine

## 2013-12-30 ENCOUNTER — Ambulatory Visit: Payer: Medicaid Other | Admitting: Physician Assistant

## 2013-12-30 ENCOUNTER — Telehealth: Payer: Self-pay | Admitting: Physician Assistant

## 2013-12-30 ENCOUNTER — Encounter: Payer: Self-pay | Admitting: Physician Assistant

## 2013-12-30 ENCOUNTER — Ambulatory Visit (INDEPENDENT_AMBULATORY_CARE_PROVIDER_SITE_OTHER): Payer: Medicaid Other | Admitting: Physician Assistant

## 2013-12-30 VITALS — BP 116/75 | HR 59 | Ht 74.0 in | Wt 197.0 lb

## 2013-12-30 DIAGNOSIS — I255 Ischemic cardiomyopathy: Secondary | ICD-10-CM

## 2013-12-30 DIAGNOSIS — I2589 Other forms of chronic ischemic heart disease: Secondary | ICD-10-CM

## 2013-12-30 DIAGNOSIS — I10 Essential (primary) hypertension: Secondary | ICD-10-CM

## 2013-12-30 DIAGNOSIS — I2510 Atherosclerotic heart disease of native coronary artery without angina pectoris: Secondary | ICD-10-CM

## 2013-12-30 LAB — BASIC METABOLIC PANEL
BUN: 11 mg/dL (ref 6–23)
CALCIUM: 9.1 mg/dL (ref 8.4–10.5)
CO2: 30 meq/L (ref 19–32)
CREATININE: 1 mg/dL (ref 0.4–1.5)
Chloride: 102 mEq/L (ref 96–112)
GFR: 96.53 mL/min (ref >60.00)
Glucose, Bld: 92 mg/dL (ref 70–99)
Potassium: 4.6 mEq/L (ref 3.5–5.1)
SODIUM: 137 meq/L (ref 135–145)

## 2013-12-30 MED ORDER — CARVEDILOL 12.5 MG PO TABS: 12.5000 mg | ORAL_TABLET | Freq: Two times a day (BID) | ORAL | Status: DC

## 2013-12-30 NOTE — Assessment & Plan Note (Signed)
Blood pressure is much better controlled since lisinopril was added.

## 2013-12-30 NOTE — Patient Instructions (Addendum)
INCREASE COREG TO 12.5 MG 1 TABLET TWICE DAILY; NEW RX WAS SENT IN TODAY TO [place]  LAB WORK TODAY; BMET  Your physician recommends that you schedule a follow-up appointment in: 2-3 MONTHS WITH DR. Burt Knack

## 2013-12-30 NOTE — Telephone Encounter (Signed)
lmom labs normal 

## 2013-12-30 NOTE — Assessment & Plan Note (Signed)
Patient's heart failure is well compensated. We will check blood work today. Increase his Coreg to 12.5 mg twice a day. Hopefully the patient will remember to take it twice a day. If not he may need to be switched back to metoprolol.
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
## 2014-01-03 ENCOUNTER — Ambulatory Visit: Payer: No Typology Code available for payment source | Attending: Family Medicine | Admitting: Physical Therapy

## 2014-01-03 DIAGNOSIS — IMO0001 Reserved for inherently not codable concepts without codable children: Secondary | ICD-10-CM | POA: Insufficient documentation

## 2014-01-03 DIAGNOSIS — M25519 Pain in unspecified shoulder: Secondary | ICD-10-CM | POA: Insufficient documentation

## 2014-01-03 DIAGNOSIS — M542 Cervicalgia: Secondary | ICD-10-CM | POA: Insufficient documentation

## 2014-01-05 ENCOUNTER — Ambulatory Visit: Payer: No Typology Code available for payment source | Admitting: Physical Therapy

## 2014-01-10 ENCOUNTER — Ambulatory Visit: Payer: No Typology Code available for payment source | Admitting: Physical Therapy

## 2014-01-10 ENCOUNTER — Telehealth: Payer: Self-pay | Admitting: Cardiovascular Disease

## 2014-01-10 NOTE — Telephone Encounter (Signed)
Received request from Nurse fax box, documents faxed for surgical clearance. To: [facility] Fax number: [phone] Attention: [phone]/kdm

## 2014-01-13 ENCOUNTER — Ambulatory Visit: Payer: No Typology Code available for payment source | Admitting: Physical Therapy

## 2014-01-17 ENCOUNTER — Ambulatory Visit: Payer: Medicaid Other | Admitting: Physician Assistant

## 2014-01-18 ENCOUNTER — Telehealth: Payer: Self-pay | Admitting: Cardiovascular Disease

## 2014-01-18 NOTE — Telephone Encounter (Signed)
Advised patient that clearance was faxed on 3/23.  Patient verbalized understanding and thanked me for the call.

## 2014-01-18 NOTE — Telephone Encounter (Signed)
Left message for patient to call back  

## 2014-01-18 NOTE — Telephone Encounter (Signed)
New problem   Pt want to know the status of the medical clearance that was faxed to [place] from his dentist office concerning tooth extractions. Please call pt.

## 2014-01-19 ENCOUNTER — Ambulatory Visit: Payer: No Typology Code available for payment source | Admitting: Physical Therapy

## 2014-01-20 ENCOUNTER — Ambulatory Visit (INDEPENDENT_AMBULATORY_CARE_PROVIDER_SITE_OTHER): Payer: Medicaid Other | Admitting: Neurology

## 2014-01-20 ENCOUNTER — Encounter: Payer: Self-pay | Admitting: Neurology

## 2014-01-20 VITALS — BP 98/61 | HR 64 | Ht 74.0 in | Wt 202.0 lb

## 2014-01-20 DIAGNOSIS — G40319 Generalized idiopathic epilepsy and epileptic syndromes, intractable, without status epilepticus: Secondary | ICD-10-CM

## 2014-01-20 DIAGNOSIS — G40919 Epilepsy, unspecified, intractable, without status epilepticus: Secondary | ICD-10-CM

## 2014-01-20 MED ORDER — LEVETIRACETAM 500 MG PO TABS: 1000.0000 mg | ORAL_TABLET | Freq: Two times a day (BID) | ORAL | Status: DC
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
## 2014-01-20 NOTE — Patient Instructions (Signed)
I had a long discussion with the patient's with regards to his refractory seizures and advised him to increase the Keppra to 100 mg twice daily and continue phenobarbital in the current dose. Consider referral to an epilepsy center at [facility] for consideration for vagal nerve stimulation or epilepsy surgery for his refractory seizures. He was advised not to drive. Return for followup in 2 months with Jeani Hawking, NP or call earlier if necessary  Epilepsy Epilepsy is a disorder in which a person has repeated seizures over time. A seizure is a release of abnormal electrical activity in the brain. Seizures can cause a change in attention, behavior, or the ability to remain awake and alert (altered mental status). Seizures often involve uncontrollable shaking (convulsions).  Most people with epilepsy lead normal lives. However, people with epilepsy are at an increased risk of falls, accidents, and injuries. Therefore, it is important to begin treatment right away. CAUSES  Epilepsy has many possible causes. Anything that disturbs the normal pattern of brain cell activity can lead to seizures. This may include:   Head injury.  Birth trauma.  High fever as a child.  Stroke.  Bleeding into or around the brain.  Certain drugs.  Prolonged low oxygen, such as what occurs after CPR efforts.  Abnormal brain development.  Certain illnesses, such as meningitis, encephalitis (brain infection), malaria, and other infections.  An imbalance of nerve signaling chemicals (neurotransmitters).  SIGNS AND SYMPTOMS  The symptoms of a seizure can vary greatly from one person to another. Right before a seizure, you may have a warning (aura) that a seizure is about to occur. An aura may include the following symptoms:  Fear or anxiety.  Nausea.  Feeling like the room is spinning (vertigo).  Vision changes, such as seeing flashing lights or spots. Common symptoms during a seizure include:  Abnormal sensations,  such as an abnormal smell or a bitter taste in the mouth.   Sudden, general body stiffness.   Convulsions that involve rhythmic jerking of the face, arm, or leg on one or both sides.   Sudden change in consciousness.   Appearing to be awake but not responding.   Appearing to be asleep but cannot be awakened.   Grimacing, chewing, lip smacking, drooling, tongue biting, or loss of bowel or bladder control. After a seizure, you may feel sleepy for a while. DIAGNOSIS  Your health care provider will ask about your symptoms and take a medical history. Descriptions from any witnesses to your seizures will be very helpful in the diagnosis. A physical exam, including a detailed neurological exam, is necessary. Various tests may be done, such as:   An electroencephalogram (EEG). This is a painless test of your brain waves. In this test, a diagram is created of your brain waves. These diagrams can be interpreted by a specialist.  An MRI of the brain.   A CT scan of the brain.   A spinal tap (lumbar puncture, LP).  Blood tests to check for signs of infection or abnormal blood chemistry. TREATMENT  There is no cure for epilepsy, but it is generally treatable. Once epilepsy is diagnosed, it is important to begin treatment as soon as possible. For most people with epilepsy, seizures can be controlled with medicines. The following may also be used:  A pacemaker for the brain (vagus nerve stimulator) can be used for people with seizures that are not well controlled by medicine.  Surgery on the brain. For some people, epilepsy eventually goes away. HOME  CARE INSTRUCTIONS   Follow your health care provider's recommendations on driving and safety in normal activities.  Get enough rest. Lack of sleep can cause seizures.  Only take over-the-counter or prescription medicines as directed by your health care provider. Take any prescribed medicine exactly as directed.  Avoid any known  triggers of your seizures.  Keep a seizure diary. Record what you recall about any seizure, especially any possible trigger.   Make sure the people you live and work with know that you are prone to seizures. They should receive instructions on how to help you. In general, a witness to a seizure should:   Cushion your head and body.   Turn you on your side.   Avoid unnecessarily restraining you.   Not place anything inside your mouth.   Call for emergency medical help if there is any question about what has occurred.   Follow up with your health care provider as directed. You may need regular blood tests to monitor the levels of your medicine.  SEEK MEDICAL CARE IF:   You develop signs of infection or other illness. This might increase the risk of a seizure.   You seem to be having more frequent seizures.   Your seizure pattern is changing.  SEEK IMMEDIATE MEDICAL CARE IF:   You have a seizure that does not stop after a few moments.   You have a seizure that causes any difficulty in breathing.   You have a seizure that results in a very severe headache.   You have a seizure that leaves you with the inability to speak or use a part of your body.  Document Released: 10/07/2005 Document Revised: 07/28/2013 Document Reviewed: 05/19/2013 [facility] Patient Information 2014 [place].

## 2014-01-27 ENCOUNTER — Telehealth: Payer: Self-pay | Admitting: Neurology

## 2014-01-27 NOTE — Telephone Encounter (Signed)
Last OV says:  PLAN: I had a long discussion with the patient's with regards to his refractory seizures and advised him to increase the Keppra to 1000 mg twice daily and continue phenobarbital in the current dose I called the patient back.  Wanted to clarify dose is 2000mg  total daily.  He will continue to take meds as directed.

## 2014-01-27 NOTE — Telephone Encounter (Signed)
Patient calling with questions regarding his Keppra dosage. Please call and advise patient.

## 2014-02-01 ENCOUNTER — Other Ambulatory Visit (INDEPENDENT_AMBULATORY_CARE_PROVIDER_SITE_OTHER): Payer: Medicaid Other | Admitting: Radiology

## 2014-02-01 DIAGNOSIS — G40319 Generalized idiopathic epilepsy and epileptic syndromes, intractable, without status epilepticus: Secondary | ICD-10-CM

## 2014-03-03 ENCOUNTER — Ambulatory Visit: Payer: Medicaid Other | Admitting: Cardiovascular Disease

## 2014-03-16 ENCOUNTER — Encounter: Payer: Self-pay | Admitting: Cardiovascular Disease

## 2014-03-16 ENCOUNTER — Ambulatory Visit (INDEPENDENT_AMBULATORY_CARE_PROVIDER_SITE_OTHER): Payer: Medicaid Other | Admitting: Cardiovascular Disease

## 2014-03-16 VITALS — BP 128/78 | HR 79 | Ht 74.0 in | Wt 199.0 lb

## 2014-03-16 DIAGNOSIS — I2510 Atherosclerotic heart disease of native coronary artery without angina pectoris: Secondary | ICD-10-CM

## 2014-03-16 NOTE — Progress Notes (Signed)
HPI:  53 year old gentleman presenting for followup evaluation. The patient is followed for coronary artery disease. He has presented with non-ST elevation infarction June 2009 and again in 2012. He's undergone stenting of the left circumflex as well as angioplasty. Medical noncompliance has been an issue and his resources are limited. His most recent heart catheterization in December 2014 demonstrated patent stents with nonobstructive disease elsewhere. 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  The patient had no recent chest pain, chest pressure, shortness of breath, or heart palpitations. He continues to smoke cigars. He's had his teeth pulled. He is awaiting dentures.  Outpatient Encounter Prescriptions as of 03/16/2014  Medication Sig  . AMBULATORY NON FORMULARY MEDICATION Take 1 capsule by mouth every other day. [place]  . aspirin 325 MG tablet Take 325 mg by mouth daily.  . carvedilol (COREG) 12.5 MG tablet Take 1 tablet (12.5 mg total) by mouth 2 (two) times daily with a meal.  . levETIRAcetam (KEPPRA) 500 MG tablet Take 2 tablets (1,000 mg total) by mouth 2 (two) times daily.  [name] lisinopril (PRINIVIL,ZESTRIL) 10 MG tablet Take 1 tablet (10 mg total) by mouth daily.  . nitroGLYCERIN (NITROSTAT) 0.4 MG SL tablet Place 0.4 mg under the tongue every 5 (five) minutes as needed for chest pain.  . Omega-3 Fatty Acids (FISH OIL) 1000 MG CAPS Take 1,000 mg by mouth daily.  [name] PHENobarbital (LUMINAL) 32.4 MG tablet Take 129.6 mg by mouth daily.  . pravastatin (PRAVACHOL) 20 MG tablet Take 20 mg by mouth daily.  . [DISCONTINUED] ibuprofen (ADVIL,MOTRIN) 800 MG tablet Take 800 mg by mouth 2 (two) times daily as needed (pain).    Allergies  Allergen Reactions  . Food Allergy Formula Other (See Comments)    Sage Causes seizures:    Past Medical History  Diagnosis Date  . CAD (coronary artery disease)     a. Overlapping BMS-prox LCx in 2009 b. NSTEMI 2012 due to  prox LCx ISR s/p PTCA alone; EF 45% w/ basal inf HK. Had run out of Plavix 1 week prior.  [name] HLD (hyperlipidemia)   . Seizure disorder   . Angina   . Hypertension   . TOBACCO ABUSE 02/22/2009  . Clotting disorder   . Seizures   . Structural heart disease     a. Echo 03/2011: EF 50%, mild LVH, basal-mid posterior and basal inferior wall HK, mild AI, mod MR, mild biatrial enlargement, normal RV size/function, PASP   . MI (myocardial infarction) x 7    ROS: Negative except as per HPI  BP 128/78  Pulse 79  Ht 6\' 2"  (1.88 m)  Wt 90.266 kg (199 lb)  BMI 25.54 kg/m2  PHYSICAL EXAM: Pt is alert and oriented, NAD HEENT: The patient is edentulous Neck: JVP - normal, carotids 2+= without bruits Lungs: CTA bilaterally CV: RRR without murmur or gallop Abd: soft, NT, Positive BS, no hepatomegaly Ext: no C/C/E, distal pulses intact and equal Skin: warm/dry no rash  Cardiac Catheterization 10/12/2014: Procedural Findings:  Hemodynamics:  AO 112/66  LV 114/14  Coronary angiography:  Coronary dominance: right  Left mainstem: Large vessel proximally, tapers with 50% distal left main stenosis with a shelf-like plaque. Divides into the LAD and left circumflex the  Left anterior descending (LAD): The LAD is a large-caliber vessel. Proximally the vessel is widely patent. The mid vessel has 40-50% stenosis. The first diagonal branch is patent without significant stenosis. The vessel wraps around the left ventricular apex  and is large in caliber even into the distal segment.  Left circumflex (LCx): The left circumflex has 50% ostial stenosis. There is an intermediate branch with 70-75% stenosis which is unchanged from previous studies. The stented segment in the mid circumflex is patent with mild diffuse in-stent restenosis of 30%. Beyond the stented segment there is nonobstructive 30-40% stenosis leading into a single obtuse marginal branch  Right coronary artery (RCA): The vessel is totally occluded in  its proximal aspect. It fills from left to right collaterals. This is unchanged from the previous study.  Intravascular ultrasound findings. The mid LAD reference vessel is widely patent with no atherosclerosis. The luminal area is 12 mm. The mid LAD lesion site has a minimal lumen area of 5.4 and percent stenosis of 55. The distal left main has eccentric plaque with a minimal lumen area of 7.5 mm. The proximal left main is very large in caliber.  Left ventriculography: The basal and midinferior wall are akinetic. The remaining portions of left ventricle contract normally. The estimated left ventricular ejection fraction is 40%.  Final Conclusions:  1. Mild to Moderate stenosis of the distal left main, ostial circumflex, and ramus intermedius, and the mid LAD  2. Continued patency of the stented segment in the proximal left circumflex.  3. Total occlusion of the right coronary artery.  4. Moderate segmental left ventricular systolic dysfunction  Recommendations: Essentially stable findings from previous cardiac catheterization. The patient needs continued aggressive risk reduction. I do not see evidence of a "culprit lesion" to explain acute coronary syndrome.  Sherren Mocha  10/13/2013, 10:55 AM  ASSESSMENT AND PLAN: 1. CAD, native vessel. The patient is stable without symptoms of angina. He will continue on aspirin, a statin drug, beta blocker, and ACE inhibitor. We discussed the critical importance of complete tobacco cessation.  2. Hypertension. Blood pressure well controlled on lisinopril and carvedilol.  3. Hyperlipidemia. He remains on low-dose pravastatin. Lipids followed by his PCP.  Sherren Mocha 03/16/2014 3:24 PM

## 2014-03-16 NOTE — Patient Instructions (Signed)
Your physician recommends that you continue on your current medications as directed. Please refer to the Current Medication list given to you today.  Your physician wants you to follow-up in: 6 months with our Nurse practitioner or our physician assistant and in 1 year with Dr Burt Knack. You will receive a reminder letter in the mail two months in advance. If you don't receive a letter, please call our office to schedule the follow-up appointment.

## 2014-09-29 ENCOUNTER — Encounter (HOSPITAL_COMMUNITY): Payer: Self-pay | Admitting: Cardiovascular Disease

## 2014-12-29 ENCOUNTER — Telehealth: Payer: Self-pay | Admitting: Neurology

## 2014-12-29 NOTE — Telephone Encounter (Signed)
[name], [place] with [facility] @ [phone], stated [facility] will not refill Rx PHENobarbital (LUMINAL) 32.4 MG tablet due to being a controlled substance.  Please call and advise.

## 2014-12-30 NOTE — Telephone Encounter (Signed)
I called back.  Spoke with Audrea Muscat.  I relayed Dr Clydene Fake message.  She verbalized understand, but said now that the patient is homeless, his only means of obtaining meds is through the health dept.  She is a PA and tried to prescribe this med for them to fill, but due to their policy, since Phenobarb is a controlled substance, they will not dispense it.  She asked that another message be sent to provider asking for a recommendation for alternate med, says otherwise patient will not be able to get Phenobarb, but will continue Keppra.

## 2014-12-30 NOTE — Telephone Encounter (Signed)
Unfortunate social situation I do not know where we can get phenobarb up from since it such an old medication and rarely used. He is likely to be at increased risk for seizures when he runs out of phenobarb. We can certainly prescribe Keppra which he can take and he may need to increase the dose if he has breakthrough seizures after stopping phenobarbital.  Arrange for Outpatient follow-up visit in our office with a a nurse practitioner

## 2014-12-30 NOTE — Telephone Encounter (Signed)
I called back.  Spoke with Ms [name].  Relayed Dr Clydene Fake message.  She verbalized understanding.  Says the patient never increased his Keppra dose to 1000mg  BID as instructed at previous OV, so she will speak with him about this.  She will call us back if anything further is needed.

## 2014-12-30 NOTE — Telephone Encounter (Signed)
[name] is calling back stating that patient needs another Rx something that is not a controlled substance.  He is homeless and there is no pharmacy to send to because the pt cannot pay for the meds.  Please call back @ [phone].

## 2014-12-30 NOTE — Telephone Encounter (Signed)
Message was just forwarded to me today.  I called back, got no answer.  Left message asking for return call to clarify.  Unclear if Rx needs to go to a different pharmacy?  Patient has 4 pharmacies listed in chart.  Will need to verify what pharmacy as well as what dose he is currently taking.  Also patient is due for annual appt.

## 2014-12-30 NOTE — Telephone Encounter (Signed)
I have seen this patient just once. My understanding is that he has been on phenobarbital for 20+ years. He needs to stay on it as sudden stoppage is likely going to precipitate more seizures. He needs to manage to get it from the same sources he has managed so far. I do not understand why with a physician's prescription he is having trouble getting phenobarbital now when he has been taking it for 20 years

## 2015-01-23 ENCOUNTER — Encounter (HOSPITAL_COMMUNITY): Payer: Self-pay | Admitting: Emergency Medicine

## 2015-01-23 ENCOUNTER — Other Ambulatory Visit: Payer: Self-pay

## 2015-01-23 ENCOUNTER — Emergency Department (HOSPITAL_COMMUNITY)
Admission: EM | Admit: 2015-01-23 | Discharge: 2015-01-24 | Disposition: A | Discharge status: Home / Self Care | Payer: Medicaid Other | Attending: Emergency Medicine | Admitting: Emergency Medicine

## 2015-01-23 DIAGNOSIS — Z720 Tobacco use: Secondary | ICD-10-CM | POA: Diagnosis not present

## 2015-01-23 DIAGNOSIS — I252 Old myocardial infarction: Secondary | ICD-10-CM | POA: Insufficient documentation

## 2015-01-23 DIAGNOSIS — Z79899 Other long term (current) drug therapy: Secondary | ICD-10-CM | POA: Insufficient documentation

## 2015-01-23 DIAGNOSIS — Z9119 Patient's noncompliance with other medical treatment and regimen: Secondary | ICD-10-CM | POA: Insufficient documentation

## 2015-01-23 DIAGNOSIS — R569 Unspecified convulsions: Secondary | ICD-10-CM | POA: Diagnosis present

## 2015-01-23 DIAGNOSIS — I25119 Atherosclerotic heart disease of native coronary artery with unspecified angina pectoris: Secondary | ICD-10-CM | POA: Diagnosis not present

## 2015-01-23 DIAGNOSIS — Z9861 Coronary angioplasty status: Secondary | ICD-10-CM | POA: Diagnosis not present

## 2015-01-23 DIAGNOSIS — G40909 Epilepsy, unspecified, not intractable, without status epilepticus: Secondary | ICD-10-CM | POA: Diagnosis not present

## 2015-01-23 DIAGNOSIS — Z7982 Long term (current) use of aspirin: Secondary | ICD-10-CM | POA: Insufficient documentation

## 2015-01-23 DIAGNOSIS — I10 Essential (primary) hypertension: Secondary | ICD-10-CM | POA: Diagnosis not present

## 2015-01-23 DIAGNOSIS — Z862 Personal history of diseases of the blood and blood-forming organs and certain disorders involving the immune mechanism: Secondary | ICD-10-CM | POA: Insufficient documentation

## 2015-01-23 DIAGNOSIS — Z9114 Patient's other noncompliance with medication regimen: Secondary | ICD-10-CM

## 2015-01-23 DIAGNOSIS — Z7901 Long term (current) use of anticoagulants: Secondary | ICD-10-CM | POA: Insufficient documentation

## 2015-01-23 DIAGNOSIS — Z8639 Personal history of other endocrine, nutritional and metabolic disease: Secondary | ICD-10-CM | POA: Insufficient documentation

## 2015-01-23 DIAGNOSIS — Z9889 Other specified postprocedural states: Secondary | ICD-10-CM | POA: Diagnosis not present

## 2015-01-23 DIAGNOSIS — Z791 Long term (current) use of non-steroidal anti-inflammatories (NSAID): Secondary | ICD-10-CM | POA: Insufficient documentation

## 2015-01-23 MED ORDER — PHENOBARBITAL 32.4 MG PO TABS
32.4000 mg | ORAL_TABLET | Freq: Once | ORAL | Status: AC
  Administered 2015-01-24: 32.4 mg via ORAL

## 2015-01-23 MED ORDER — LEVETIRACETAM 500 MG PO TABS
1000.0000 mg | ORAL_TABLET | Freq: Once | ORAL | Status: AC
  Administered 2015-01-24: 1000 mg via ORAL
  Filled 2015-01-23: qty 2

## 2015-01-23 MED ORDER — LORAZEPAM 2 MG/ML IJ SOLN: 1.0000 mg | INTRAMUSCULAR | Status: DC | PRN

## 2015-01-23 NOTE — ED Notes (Signed)
Per EMS: Pt from [facility]. Residents state that pt was sleeping, then woke up and started having a seizure, fell on the floor, had full body convulsions lasting about 2 minutes. Upon EMS arrival, pt responsive to verbal stimuli, was post-ictal and unable to answer questions. Upon arrival in ED pt alert and oriented. Pt c/o headache and chest pain. Denies neck or back pain. Pt has hx of seizures.

## 2015-01-23 NOTE — ED Notes (Signed)
Bed: [facility] Expected date:  Expected time:  Means of arrival:  Comments: EMS seizure 

## 2015-01-24 ENCOUNTER — Emergency Department (HOSPITAL_COMMUNITY)
Admission: EM | Admit: 2015-01-24 | Discharge: 2015-01-25 | Disposition: A | Discharge status: Home / Self Care | Payer: Medicaid Other | Source: Home / Self Care | Attending: Emergency Medicine | Admitting: Emergency Medicine

## 2015-01-24 ENCOUNTER — Encounter (HOSPITAL_COMMUNITY): Payer: Self-pay | Admitting: Emergency Medicine

## 2015-01-24 DIAGNOSIS — Z8639 Personal history of other endocrine, nutritional and metabolic disease: Secondary | ICD-10-CM | POA: Insufficient documentation

## 2015-01-24 DIAGNOSIS — G40909 Epilepsy, unspecified, not intractable, without status epilepticus: Secondary | ICD-10-CM

## 2015-01-24 DIAGNOSIS — Z862 Personal history of diseases of the blood and blood-forming organs and certain disorders involving the immune mechanism: Secondary | ICD-10-CM

## 2015-01-24 DIAGNOSIS — Z79899 Other long term (current) drug therapy: Secondary | ICD-10-CM | POA: Insufficient documentation

## 2015-01-24 DIAGNOSIS — Z9861 Coronary angioplasty status: Secondary | ICD-10-CM | POA: Insufficient documentation

## 2015-01-24 DIAGNOSIS — Z9889 Other specified postprocedural states: Secondary | ICD-10-CM

## 2015-01-24 DIAGNOSIS — I10 Essential (primary) hypertension: Secondary | ICD-10-CM | POA: Insufficient documentation

## 2015-01-24 DIAGNOSIS — Z720 Tobacco use: Secondary | ICD-10-CM | POA: Insufficient documentation

## 2015-01-24 DIAGNOSIS — I25119 Atherosclerotic heart disease of native coronary artery with unspecified angina pectoris: Secondary | ICD-10-CM

## 2015-01-24 DIAGNOSIS — Z7902 Long term (current) use of antithrombotics/antiplatelets: Secondary | ICD-10-CM

## 2015-01-24 DIAGNOSIS — Z7982 Long term (current) use of aspirin: Secondary | ICD-10-CM | POA: Insufficient documentation

## 2015-01-24 MED ORDER — LEVETIRACETAM 750 MG PO TABS
750.0000 mg | ORAL_TABLET | Freq: Once | ORAL | Status: AC
  Administered 2015-01-25: 750 mg via ORAL
  Filled 2015-01-24: qty 1

## 2015-01-24 NOTE — ED Provider Notes (Signed)
CSN: [id number]     Arrival date & time 01/24/15  2144 History   First MD Initiated Contact with Patient 01/24/15 2227     Chief Complaint  Patient presents with  . Seizures     (Consider location/radiation/quality/duration/timing/severity/associated sxs/prior Treatment) HPI Patient presents to the emergency department with seizure that occurred earlier this evening.  The patient states that he has seizure history.  Patient is not very helpful and is very difficult to get a history out of because he is not wanting to answer questions.  She denies any pain, headache, blurred vision, weakness, dizziness, neck pain, back pain or fevers.  The patient is not willing to tell me any further history as he states she is here.  All the time Past Medical History  Diagnosis Date  . CAD (coronary artery disease)     a. Overlapping BMS-prox LCx in 2009 b. NSTEMI 2012 due to prox LCx ISR s/p PTCA alone; EF 45% w/ basal inf HK. Had run out of Plavix 1 week prior.  [name] HLD (hyperlipidemia)   . Seizure disorder   . Angina   . Hypertension   . TOBACCO ABUSE 02/22/2009  . Clotting disorder   . Seizures   . Structural heart disease     a. Echo 03/2011: EF 50%, mild LVH, basal-mid posterior and basal inferior wall HK, mild AI, mod MR, mild biatrial enlargement, normal RV size/function, PASP   . MI (myocardial infarction) x 7   Past Surgical History  Procedure Laterality Date  . Coronary angioplasty with stent placement  10/2007    BMS-prox LCx  . Coronary angioplasty  03/2011    PTCA-prox LCx ISR  . Left heart catheterization with coronary angiogram [name] 10/13/2013    Procedure: LEFT HEART CATHETERIZATION WITH CORONARY ANGIOGRAM;  Surgeon: Blane Ohara, MD;  Location: [facility] CATH LAB;  Service: Cardiovascular;  Laterality: [name];   Family History  Problem Relation Age of Onset  . Coronary artery disease Maternal Aunt   . Heart disease Maternal Uncle   . Stroke Maternal Uncle    History  Substance Use  Topics  . Smoking status: Light Tobacco Smoker -- 1.00 packs/day for 35 years    Types: Cigars  . Smokeless tobacco: Never Used  . Alcohol Use: No    Review of Systems Level V caveat applies due to uncooperativeness  Allergies  Food allergy formula  Home Medications   Prior to Admission medications   Medication Sig Start Date End Date Taking? Authorizing Provider  AMBULATORY NON FORMULARY MEDICATION Take 1 capsule by mouth every other day. [place]   Yes Historical Provider, MD  aspirin 325 MG tablet Take 325 mg by mouth daily.   Yes Historical Provider, MD  carvedilol (COREG) 12.5 MG tablet Take 1 tablet (12.5 mg total) by mouth 2 (two) times daily with a meal. 12/30/13  Yes Imogene Burn, PA-C  clopidogrel (PLAVIX) 75 MG tablet Take 75 mg by mouth daily.   Yes Historical Provider, MD  levETIRAcetam (KEPPRA XR) 500 MG 24 hr tablet Take 500 mg by mouth daily.   Yes Historical Provider, MD  nitroGLYCERIN (NITROSTAT) 0.4 MG SL tablet Place 0.4 mg under the tongue every 5 (five) minutes as needed for chest pain.   Yes Historical Provider, MD  Omega-3 Fatty Acids (FISH OIL) 1000 MG CAPS Take 1,000 mg by mouth daily.   Yes Historical Provider, MD  PHENobarbital (LUMINAL) 32.4 MG tablet Take 32.4 mg by mouth 4 (four) times daily.  Yes Historical Provider, MD  levETIRAcetam (KEPPRA) 500 MG tablet Take 2 tablets (1,000 mg total) by mouth 2 (two) times daily. Patient not taking: Reported on 01/23/2015 01/20/14   Garvin Fila, MD  lisinopril (PRINIVIL,ZESTRIL) 10 MG tablet Take 1 tablet (10 mg total) by mouth daily. Patient not taking: Reported on 01/23/2015 12/03/13   Sherren Mocha, MD   BP 103/61 mmHg  Pulse 88  Temp(Src) 98.7 F (37.1 C) (Oral)  Resp 21  SpO2 97% Physical Exam  Constitutional: He is oriented to person, place, and time. He appears well-developed and well-nourished. No distress.  HENT:  Head: Normocephalic and atraumatic.  Mouth/Throat: Oropharynx is  clear and moist.  Eyes: Pupils are equal, round, and reactive to light.  Neck: Normal range of motion. Neck supple.  Cardiovascular: Normal rate, regular rhythm and normal heart sounds.  Exam reveals no gallop and no friction rub.   No murmur heard. Pulmonary/Chest: Effort normal and breath sounds normal. No respiratory distress.  Neurological: He is alert and oriented to person, place, and time. He exhibits normal muscle tone. Coordination normal.  Skin: Skin is warm and dry. No rash noted. No erythema.  Nursing note and vitals reviewed.   ED Course  Procedures (including critical care time) Labs Review Labs Reviewed  PHENOBARBITAL LEVEL  ETHANOL  I-STAT CHEM 8, ED    Patient refuses taking any medications here and like to be discharged home.  Patient is advised return here as needed.  Follow-up with his primary care doctor  Dalia Heading, PA-C 01/25/15 0018  Everlene Balls, MD 01/25/15 [phone]

## 2015-01-24 NOTE — ED Notes (Signed)
Bed: WA02 Expected date: 01/24/15 Expected time: 9:30 PM Means of arrival: Ambulance Comments: Seizures

## 2015-01-24 NOTE — ED Provider Notes (Signed)
CSN: [id number]     Arrival date & time 01/23/15  2316 History   First MD Initiated Contact with Patient 01/23/15 2325     Chief Complaint  Patient presents with  . Seizures      HPI  Vision presents for evaluation after seizure. He was at [employer] and had a witnessed generalized seizure. He was laying when it happened. He spoke a few words to someone next exam and then had generalized seizure. Postictal for 4-5 minutes. Awakened in route. Arrives here asymptomatic back to his baseline.  Past Medical History  Diagnosis Date  . CAD (coronary artery disease)     a. Overlapping BMS-prox LCx in 2009 b. NSTEMI 2012 due to prox LCx ISR s/p PTCA alone; EF 45% w/ basal inf HK. Had run out of Plavix 1 week prior.  [name] HLD (hyperlipidemia)   . Seizure disorder   . Angina   . Hypertension   . TOBACCO ABUSE 02/22/2009  . Clotting disorder   . Seizures   . Structural heart disease     a. Echo 03/2011: EF 50%, mild LVH, basal-mid posterior and basal inferior wall HK, mild AI, mod MR, mild biatrial enlargement, normal RV size/function, PASP   . MI (myocardial infarction) x 7   Past Surgical History  Procedure Laterality Date  . Coronary angioplasty with stent placement  10/2007    BMS-prox LCx  . Coronary angioplasty  03/2011    PTCA-prox LCx ISR  . Left heart catheterization with coronary angiogram [name] 10/13/2013    Procedure: LEFT HEART CATHETERIZATION WITH CORONARY ANGIOGRAM;  Surgeon: Blane Ohara, MD;  Location: [facility] CATH LAB;  Service: Cardiovascular;  Laterality: [name];   Family History  Problem Relation Age of Onset  . Coronary artery disease Maternal Aunt   . Heart disease Maternal Uncle   . Stroke Maternal Uncle    History  Substance Use Topics  . Smoking status: Light Tobacco Smoker -- 1.00 packs/day for 35 years    Types: Cigars  . Smokeless tobacco: Never Used  . Alcohol Use: No    Review of Systems  Constitutional: Negative for fever, chills, diaphoresis, appetite  change and fatigue.  HENT: Negative for mouth sores, sore throat and trouble swallowing.   Eyes: Negative for visual disturbance.  Respiratory: Negative for cough, chest tightness, shortness of breath and wheezing.   Cardiovascular: Negative for chest pain.  Gastrointestinal: Negative for nausea, vomiting, abdominal pain, diarrhea and abdominal distention.  Endocrine: Negative for polydipsia, polyphagia and polyuria.  Genitourinary: Negative for dysuria, frequency and hematuria.  Musculoskeletal: Negative for gait problem.  Skin: Negative for color change, pallor and rash.  Neurological: Positive for seizures. Negative for dizziness, syncope, light-headedness and headaches.  Hematological: Does not bruise/bleed easily.  Psychiatric/Behavioral: Negative for behavioral problems and confusion.      Allergies  Food allergy formula  Home Medications   Prior to Admission medications   Medication Sig Start Date End Date Taking? Authorizing Provider  AMBULATORY NON FORMULARY MEDICATION Take 1 capsule by mouth every other day. [place]   Yes Historical Provider, MD  aspirin 325 MG tablet Take 325 mg by mouth daily.   Yes Historical Provider, MD  carvedilol (COREG) 12.5 MG tablet Take 1 tablet (12.5 mg total) by mouth 2 (two) times daily with a meal. 12/30/13  Yes Imogene Burn, PA-C  clopidogrel (PLAVIX) 75 MG tablet Take 75 mg by mouth daily.   Yes Historical Provider, MD  levETIRAcetam (KEPPRA XR) 500  MG 24 hr tablet Take 500 mg by mouth daily.   Yes Historical Provider, MD  nitroGLYCERIN (NITROSTAT) 0.4 MG SL tablet Place 0.4 mg under the tongue every 5 (five) minutes as needed for chest pain.   Yes Historical Provider, MD  Omega-3 Fatty Acids (FISH OIL) 1000 MG CAPS Take 1,000 mg by mouth daily.   Yes Historical Provider, MD  PHENobarbital (LUMINAL) 32.4 MG tablet Take 32.4 mg by mouth 4 (four) times daily.    Yes Historical Provider, MD  levETIRAcetam (KEPPRA) 500 MG  tablet Take 2 tablets (1,000 mg total) by mouth 2 (two) times daily. Patient not taking: Reported on 01/23/2015 01/20/14   Garvin Fila, MD  lisinopril (PRINIVIL,ZESTRIL) 10 MG tablet Take 1 tablet (10 mg total) by mouth daily. Patient not taking: Reported on 01/23/2015 12/03/13   Sherren Mocha, MD   There were no vitals taken for this visit. Physical Exam  Constitutional: He is oriented to person, place, and time. He appears well-developed and well-nourished. No distress.  HENT:  Head: Normocephalic.  Eyes: Conjunctivae are normal. Pupils are equal, round, and reactive to light. No scleral icterus.  Neck: Normal range of motion. Neck supple. No thyromegaly present.  Cardiovascular: Normal rate and regular rhythm.  Exam reveals no gallop and no friction rub.   No murmur heard. Pulmonary/Chest: Effort normal and breath sounds normal. No respiratory distress. He has no wheezes. He has no rales.  Abdominal: Soft. Bowel sounds are normal. He exhibits no distension. There is no tenderness. There is no rebound.  Musculoskeletal: Normal range of motion.  Neurological: He is alert and oriented to person, place, and time.  Skin: Skin is warm and dry. No rash noted.  Psychiatric: He has a normal mood and affect. His behavior is normal.    ED Course  Procedures (including critical care time) Labs Review Labs Reviewed - No data to display  Imaging Review No results found.   EKG Interpretation None      MDM   Final diagnoses:  Seizure  Noncompliance w/medication treatment due to intermit use of medication    Indication for extensive evaluation at this time. He was given by mouth doses here with all. Was given his by mouth/PM dose of Keppra. Given IV Ativan 1 mg for prophylaxis. After completion medications think is appropriate for discharge. He states that his care manager can fill his phenobarbital prescription tomorrow.    Tanna Furry, MD 01/24/15 [phone]

## 2015-01-24 NOTE — ED Notes (Signed)
Pt arrived to the ED via EMS from the [facility] with a complaint of a seizure.  Pt was seen here this am for same.  Pt states seizure lasted 3 minutes. EMS arrived 5-6 minutes after initial EMS call to find patient alert and orientated.  Pt was able to walk to the bed from EMS stretcher.  EMS states they did see a episode of bigeminy on their monitor but it was too brief to record.

## 2015-01-24 NOTE — Discharge Instructions (Signed)
Seizure, Adult A seizure means there is unusual activity in the brain. A seizure can cause changes in attention or behavior. Seizures often cause shaking (convulsions). Seizures often last from 30 seconds to 2 minutes. HOME CARE   If you are given medicines, take them exactly as told by your doctor.  Keep all doctor visits as told.  Do not swim or drive until your doctor says it is okay.  Teach others what to do if you have a seizure. They should:  Lay you on the ground.  Put a cushion under your head.  Loosen any tight clothing around your neck.  Turn you on your side.  Stay with you until you get better. GET HELP RIGHT AWAY IF:   The seizure lasts longer than 2 to 5 minutes.  The seizure is very bad.  The person does not wake up after the seizure.  The person's attention or behavior changes. Drive the person to the emergency room or call your local emergency services (911 in [place] MAKE SURE YOU:   Understand these instructions.  Will watch your condition.  Will get help right away if you are not doing well or get worse. Document Released: 03/25/2008 Document Revised: 12/30/2011 Document Reviewed: 09/25/2011 ExitCare Patient Information 2015 [employer]. This information is not intended to replace advice given to you by your health care provider. Make sure you discuss any questions you have with your health care provider.  

## 2015-01-25 LAB — I-STAT CHEM 8, ED
BUN: 12 mg/dL (ref 6–23)
Calcium, Ion: 1.21 mmol/L (ref 1.12–1.23)
Chloride: 103 mmol/L (ref 96–112)
Creatinine, Ser: 1 mg/dL (ref 0.50–1.35)
Glucose, Bld: 105 mg/dL — ABNORMAL HIGH (ref 70–99)
HCT: 33 % — ABNORMAL LOW (ref 39.0–52.0)
Hemoglobin: 11.2 g/dL — ABNORMAL LOW (ref 13.0–17.0)
Potassium: 3.6 mmol/L (ref 3.5–5.1)
Sodium: 143 mmol/L (ref 135–145)
TCO2: 23 mmol/L (ref 0–100)

## 2015-01-25 LAB — PHENOBARBITAL LEVEL: Phenobarbital: 5.1 ug/mL — ABNORMAL LOW (ref 15.0–40.0)

## 2015-01-25 LAB — ETHANOL: Alcohol, Ethyl (B): <5 mg/dL (ref 0–9)

## 2015-01-25 NOTE — Discharge Instructions (Signed)
Return here as needed.  Follow-up with your primary care doctor °

## 2015-01-25 NOTE — ED Notes (Signed)
Pt states he has no means to return to the [facility].  Pt states that the Hospital is responsible for his transportation.  Pt told he could stay until the buses run and would be given a bus pass.  Pt said he was glad because he could now sleep.

## 2015-02-21 ENCOUNTER — Ambulatory Visit: Payer: Self-pay | Admitting: Nurse Practitioner

## 2015-05-03 DIAGNOSIS — R251 Tremor, unspecified: Secondary | ICD-10-CM | POA: Diagnosis not present

## 2015-05-26 ENCOUNTER — Emergency Department (HOSPITAL_COMMUNITY): Payer: Medicare Other

## 2015-05-26 ENCOUNTER — Encounter (HOSPITAL_COMMUNITY): Payer: Self-pay | Admitting: *Deleted

## 2015-05-26 ENCOUNTER — Inpatient Hospital Stay (HOSPITAL_COMMUNITY)
Admission: EM | Admit: 2015-05-26 | Discharge: 2015-05-30 | DRG: 281 | Discharge status: Against Medical Advice | Payer: Medicare Other | Attending: Internal Medicine | Admitting: Internal Medicine

## 2015-05-26 DIAGNOSIS — G40909 Epilepsy, unspecified, not intractable, without status epilepticus: Secondary | ICD-10-CM

## 2015-05-26 DIAGNOSIS — R0602 Shortness of breath: Secondary | ICD-10-CM | POA: Diagnosis not present

## 2015-05-26 DIAGNOSIS — Z955 Presence of coronary angioplasty implant and graft: Secondary | ICD-10-CM | POA: Diagnosis not present

## 2015-05-26 DIAGNOSIS — I10 Essential (primary) hypertension: Secondary | ICD-10-CM | POA: Diagnosis present

## 2015-05-26 DIAGNOSIS — I214 Non-ST elevation (NSTEMI) myocardial infarction: Principal | ICD-10-CM | POA: Diagnosis present

## 2015-05-26 DIAGNOSIS — I25110 Atherosclerotic heart disease of native coronary artery with unstable angina pectoris: Secondary | ICD-10-CM

## 2015-05-26 DIAGNOSIS — Z87891 Personal history of nicotine dependence: Secondary | ICD-10-CM | POA: Diagnosis present

## 2015-05-26 DIAGNOSIS — Z79899 Other long term (current) drug therapy: Secondary | ICD-10-CM

## 2015-05-26 DIAGNOSIS — I2582 Chronic total occlusion of coronary artery: Secondary | ICD-10-CM | POA: Diagnosis not present

## 2015-05-26 DIAGNOSIS — I080 Rheumatic disorders of both mitral and aortic valves: Secondary | ICD-10-CM | POA: Diagnosis present

## 2015-05-26 DIAGNOSIS — E785 Hyperlipidemia, unspecified: Secondary | ICD-10-CM | POA: Diagnosis not present

## 2015-05-26 DIAGNOSIS — R079 Chest pain, unspecified: Secondary | ICD-10-CM | POA: Diagnosis not present

## 2015-05-26 DIAGNOSIS — I2510 Atherosclerotic heart disease of native coronary artery without angina pectoris: Secondary | ICD-10-CM | POA: Insufficient documentation

## 2015-05-26 DIAGNOSIS — Z7902 Long term (current) use of antithrombotics/antiplatelets: Secondary | ICD-10-CM

## 2015-05-26 DIAGNOSIS — Z7982 Long term (current) use of aspirin: Secondary | ICD-10-CM

## 2015-05-26 DIAGNOSIS — I255 Ischemic cardiomyopathy: Secondary | ICD-10-CM | POA: Diagnosis present

## 2015-05-26 DIAGNOSIS — Z9119 Patient's noncompliance with other medical treatment and regimen: Secondary | ICD-10-CM | POA: Diagnosis present

## 2015-05-26 DIAGNOSIS — I5022 Chronic systolic (congestive) heart failure: Secondary | ICD-10-CM | POA: Diagnosis not present

## 2015-05-26 DIAGNOSIS — I25119 Atherosclerotic heart disease of native coronary artery with unspecified angina pectoris: Secondary | ICD-10-CM | POA: Insufficient documentation

## 2015-05-26 DIAGNOSIS — F17290 Nicotine dependence, other tobacco product, uncomplicated: Secondary | ICD-10-CM | POA: Diagnosis present

## 2015-05-26 DIAGNOSIS — I2584 Coronary atherosclerosis due to calcified coronary lesion: Secondary | ICD-10-CM | POA: Diagnosis present

## 2015-05-26 DIAGNOSIS — I252 Old myocardial infarction: Secondary | ICD-10-CM

## 2015-05-26 DIAGNOSIS — Z720 Tobacco use: Secondary | ICD-10-CM | POA: Diagnosis present

## 2015-05-26 LAB — CBC
HCT: 36.9 % — ABNORMAL LOW (ref 39.0–52.0)
HEMOGLOBIN: 12.3 g/dL — AB (ref 13.0–17.0)
MCH: 29.3 pg (ref 26.0–34.0)
MCHC: 33.3 g/dL (ref 30.0–36.0)
MCV: 87.9 fL (ref 78.0–100.0)
PLATELETS: 365 10*3/uL (ref 150–400)
RBC: 4.2 MIL/uL — AB (ref 4.22–5.81)
RDW: 16 % — AB (ref 11.5–15.5)
WBC: 6.3 10*3/uL (ref 4.0–10.5)

## 2015-05-26 LAB — BASIC METABOLIC PANEL
ANION GAP: 10 (ref 5–15)
BUN: 10 mg/dL (ref 6–20)
CALCIUM: 9.6 mg/dL (ref 8.9–10.3)
CHLORIDE: 99 mmol/L — AB (ref 101–111)
CO2: 27 mmol/L (ref 22–32)
Creatinine, Ser: 0.98 mg/dL (ref 0.61–1.24)
GFR calc Af Amer: >60 mL/min (ref >60)
Glucose, Bld: 87 mg/dL (ref 65–99)
Potassium: 4.4 mmol/L (ref 3.5–5.1)
SODIUM: 136 mmol/L (ref 135–145)

## 2015-05-26 LAB — I-STAT TROPONIN, ED
TROPONIN I, POC: 0.16 ng/mL — AB (ref 0.00–0.08)
Troponin i, poc: 0.58 ng/mL (ref 0.00–0.08)

## 2015-05-26 LAB — PHENOBARBITAL LEVEL: Phenobarbital: 15.9 ug/mL (ref 15.0–40.0)

## 2015-05-26 MED ORDER — NITROGLYCERIN IN D5W 200-5 MCG/ML-% IV SOLN
0.0000 ug/min | Freq: Once | INTRAVENOUS | Status: AC
  Administered 2015-05-26: 5 ug/min via INTRAVENOUS
  Filled 2015-05-26: qty 250

## 2015-05-26 MED ORDER — HEPARIN (PORCINE) IN NACL 100-0.45 UNIT/ML-% IJ SOLN
1250.0000 [IU]/h | INTRAMUSCULAR | Status: DC
  Administered 2015-05-26: 1050 [IU]/h via INTRAVENOUS
  Administered 2015-05-27 – 2015-05-28 (×2): 1250 [IU]/h via INTRAVENOUS
  Filled 2015-05-26 (×4): qty 250

## 2015-05-26 MED ORDER — NITROGLYCERIN 0.4 MG SL SUBL
0.4000 mg | SUBLINGUAL_TABLET | Freq: Once | SUBLINGUAL | Status: DC
  Filled 2015-05-26: qty 1

## 2015-05-26 MED ORDER — HEPARIN BOLUS VIA INFUSION
4000.0000 [IU] | Freq: Once | INTRAVENOUS | Status: AC
  Administered 2015-05-26: 4000 [IU] via INTRAVENOUS
  Filled 2015-05-26: qty 4000

## 2015-05-26 NOTE — ED Notes (Signed)
MD at bedside. 

## 2015-05-26 NOTE — Progress Notes (Signed)
ANTICOAGULATION CONSULT NOTE - Initial Consult  Pharmacy Consult for heparin  Indication: chest pain/ACS  Allergies  Allergen Reactions  . Food Allergy Formula Other (See Comments)    Sage Causes seizures:    Patient Measurements: Height: 6\' 2"  (188 cm) Weight: 195 lb (88.451 kg) IBW/kg (Calculated) : 82.2 Heparin Dosing Weight: 88.5 kg   Vital Signs: Temp: 98.1 F (36.7 C) (08/05 1804) Temp Source: Oral (08/05 1804) BP: 115/73 mmHg (08/05 1910) Pulse Rate: 69 (08/05 1910)  Labs:  Recent Labs  05/26/15 1807  HGB 12.3*  HCT 36.9*  PLT 365    CrCl cannot be calculated (Patient has no serum creatinine result on file.).   Medical History: Past Medical History  Diagnosis Date  . CAD (coronary artery disease)     a. Overlapping BMS-prox LCx in 2009 b. NSTEMI 2012 due to prox LCx ISR s/p PTCA alone; EF 45% w/ basal inf HK. Had run out of Plavix 1 week prior.  [name] HLD (hyperlipidemia)   . Seizure disorder   . Angina   . Hypertension   . TOBACCO ABUSE 02/22/2009  . Clotting disorder   . Seizures   . Structural heart disease     a. Echo 03/2011: EF 50%, mild LVH, basal-mid posterior and basal inferior wall HK, mild AI, mod MR, mild biatrial enlargement, normal RV size/function, PASP   . MI (myocardial infarction) x 7    Medications:   (Not in a hospital admission)  Assessment: 54 yo male admitted with chest pain. Initiating heparin gtt for ACS. SCr 0.98, CBC stable, no anticoagulation PTA.  Goal of Therapy:  Heparin level 0.3-0.7 units/ml Monitor platelets by anticoagulation protocol: Yes   Plan:  -Heparin 4000 units x1 then 1050 units/hr -Daily HL, CBC -Monitor s/sx bleeding   Hughes Better, PharmD, BCPS Clinical Pharmacist Pager: [phone] 05/26/2015 7:23 PM

## 2015-05-26 NOTE — ED Provider Notes (Signed)
CSN: [id number]     Arrival date & time 05/26/15  1754 History   First MD Initiated Contact with Patient 05/26/15 1904     Chief Complaint  Patient presents with  . Chest Pain     (Consider location/radiation/quality/duration/timing/severity/associated sxs/prior Treatment) The history is provided by the patient.   patient presents with chest pain. His chest pressure on his anterior chest that feels like his previous MI. States he had an episode yesterday and then again today that started around noon. States he did not come in yesterday It Was Gas and Go Away. No Fevers. Mild Shortness of Breath. No Cough. States He Is on Medicines for His Heart and Took His Last Plavix Today. No Diaphoresis. Slight Nausea. No Blood in the Stool. He Sees Dr. Burt Knack.  Past Medical History  Diagnosis Date  . CAD (coronary artery disease)     a. Overlapping BMS-prox LCx in 2009 b. NSTEMI 2012 due to prox LCx ISR s/p PTCA alone; EF 45% w/ basal inf HK. Had run out of Plavix 1 week prior.  [name] HLD (hyperlipidemia)   . Seizure disorder   . Angina   . Hypertension   . TOBACCO ABUSE 02/22/2009  . Clotting disorder   . Seizures   . Structural heart disease     a. Echo 03/2011: EF 50%, mild LVH, basal-mid posterior and basal inferior wall HK, mild AI, mod MR, mild biatrial enlargement, normal RV size/function, PASP   . MI (myocardial infarction) x 7   Past Surgical History  Procedure Laterality Date  . Coronary angioplasty with stent placement  10/2007    BMS-prox LCx  . Coronary angioplasty  03/2011    PTCA-prox LCx ISR  . Left heart catheterization with coronary angiogram [name] 10/13/2013    Procedure: LEFT HEART CATHETERIZATION WITH CORONARY ANGIOGRAM;  Surgeon: Blane Ohara, MD;  Location: [facility] CATH LAB;  Service: Cardiovascular;  Laterality: [name];   Family History  Problem Relation Age of Onset  . Coronary artery disease Maternal Aunt   . Heart disease Maternal Uncle   . Stroke Maternal Uncle    History   Substance Use Topics  . Smoking status: Light Tobacco Smoker -- 1.00 packs/day for 35 years    Types: Cigars  . Smokeless tobacco: Never Used  . Alcohol Use: No    Review of Systems  Constitutional: Negative for activity change and appetite change.  Eyes: Negative for pain.  Respiratory: Positive for shortness of breath. Negative for chest tightness.   Cardiovascular: Positive for chest pain. Negative for leg swelling.  Gastrointestinal: Negative for nausea, vomiting, abdominal pain and diarrhea.  Genitourinary: Negative for flank pain.  Musculoskeletal: Negative for back pain and neck stiffness.  Skin: Negative for rash.  Neurological: Negative for weakness, numbness and headaches.  Psychiatric/Behavioral: Negative for behavioral problems.      Allergies  Food allergy formula  Home Medications   Prior to Admission medications   Medication Sig Start Date End Date Taking? Authorizing Provider  acetaminophen (TYLENOL) 500 MG tablet Take 1,000 mg by mouth every 6 (six) hours as needed for mild pain, moderate pain or headache.   Yes Historical Provider, MD  aspirin 325 MG tablet Take 325 mg by mouth daily.   Yes Historical Provider, MD  atorvastatin (LIPITOR) 20 MG tablet Take 20 mg by mouth daily.   Yes Historical Provider, MD  carvedilol (COREG) 6.25 MG tablet Take 6.25 mg by mouth 2 (two) times daily with a meal.   Yes Historical  Provider, MD  clopidogrel (PLAVIX) 75 MG tablet Take 75 mg by mouth daily.   Yes Historical Provider, MD  levETIRAcetam (KEPPRA XR) 500 MG 24 hr tablet Take 1,000 mg by mouth daily.    Yes Historical Provider, MD  nitroGLYCERIN (NITROSTAT) 0.4 MG SL tablet Place 0.4 mg under the tongue every 5 (five) minutes as needed for chest pain.   Yes Historical Provider, MD  PHENobarbital (LUMINAL) 32.4 MG tablet Take 32.4 mg by mouth 4 (four) times daily.    Yes Historical Provider, MD  POTASSIUM PO Take 1 tablet by mouth daily at 12 noon.   Yes Historical  Provider, MD  carvedilol (COREG) 12.5 MG tablet Take 1 tablet (12.5 mg total) by mouth 2 (two) times daily with a meal. Patient not taking: Reported on 05/26/2015 12/30/13   Imogene Burn, PA-C  levETIRAcetam (KEPPRA) 500 MG tablet Take 2 tablets (1,000 mg total) by mouth 2 (two) times daily. Patient not taking: Reported on 01/23/2015 01/20/14   Garvin Fila, MD  lisinopril (PRINIVIL,ZESTRIL) 10 MG tablet Take 1 tablet (10 mg total) by mouth daily. Patient not taking: Reported on 01/23/2015 12/03/13   Sherren Mocha, MD   BP 113/79 mmHg  Pulse 63  Temp(Src) 98.1 F (36.7 C) (Oral)  Resp 11  Ht 6\' 2"  (1.88 m)  Wt 195 lb (88.451 kg)  BMI 25.03 kg/m2  SpO2 95% Physical Exam  Constitutional: He is oriented to person, place, and time. He appears well-developed and well-nourished.  HENT:  Head: Normocephalic and atraumatic.  Eyes: EOM are normal. Pupils are equal, round, and reactive to light.  Neck: Normal range of motion. Neck supple.  Cardiovascular: Normal rate, regular rhythm and normal heart sounds.   No murmur heard. Pulmonary/Chest: Effort normal and breath sounds normal.  Abdominal: Soft. Bowel sounds are normal. He exhibits no distension. There is no tenderness.  Musculoskeletal: Normal range of motion. He exhibits no edema.  Neurological: He is alert and oriented to person, place, and time. No cranial nerve deficit.  Skin: Skin is warm and dry.  Psychiatric: He has a normal mood and affect.  Nursing note and vitals reviewed.   ED Course  Procedures (including critical care time) Labs Review Labs Reviewed  BASIC METABOLIC PANEL - Abnormal; Notable for the following:    Chloride 99 (*)    All other components within normal limits  CBC - Abnormal; Notable for the following:    RBC 4.20 (*)    Hemoglobin 12.3 (*)    HCT 36.9 (*)    RDW 16.0 (*)    All other components within normal limits  I-STAT TROPOININ, ED - Abnormal; Notable for the following:    Troponin i, poc 0.16  (*)    All other components within normal limits  I-STAT TROPOININ, ED - Abnormal; Notable for the following:    Troponin i, poc 0.58 (*)    All other components within normal limits  PHENOBARBITAL LEVEL  HEPARIN LEVEL (UNFRACTIONATED)  CBC  HEPARIN LEVEL (UNFRACTIONATED)    Imaging Review Dg Chest 2 View  05/26/2015   CLINICAL DATA:  Chest pain and shortness of Breath for 2 days.  EXAM: CHEST  2 VIEW  COMPARISON:  12/09/2013  FINDINGS: The cardiac silhouette, mediastinal and hilar contours are within normal limits and stable. Coronary artery stents are noted. There is tortuosity of the thoracic aorta. There are mild emphysematous changes and chronic bronchitic type lung changes but no infiltrates, edema or effusions. The bony thorax  is intact.  IMPRESSION: Chronic emphysematous and bronchitic type lung changes but no definite acute overlying pulmonary process.   Electronically Signed   By: Marijo Sanes M.D.   On: 05/26/2015 18:36     EKG Interpretation   Date/Time:  Friday May 26 2015 17:59:28 EDT Ventricular Rate:  71 PR Interval:  146 QRS Duration: 112 QT Interval:  412 QTC Calculation: 447 R Axis:   20 Text Interpretation:  Normal sinus rhythm Incomplete right bundle branch  block Nonspecific ST and T wave abnormality Abnormal ECG Confirmed by  Alvino Chapel  MD, Naythen Heikkila [phone]) on 05/26/2015 7:04:19 PM    EKG Interpretation  Date/Time:  Friday May 26 2015 19:10:25 EDT Ventricular Rate:  66 PR Interval:  148 QRS Duration: 127 QT Interval:  450 QTC Calculation: 471 R Axis:   -13 Text Interpretation:  Sinus rhythm Probable left ventricular hypertrophy Borderline T abnormalities, inferior leads ST elev, probable normal early repol pattern No significant change since last tracing Confirmed by Xavyer Steenson  MD, Ovid Curd [phone]) on 05/26/2015 7:25:33 PM        MDM   Final diagnoses:  Non-STEMI (non-ST elevated myocardial infarction)    Patient with chest pain. Like previous  MI per patient. Troponin is elevated at 0.1 2.5. Pain is now relieved on nitro drip and heparin drip. Patient states that he took his aspirin and Plavix today. Discussed with cardiology after some difficulty getting a hold of the fellow.  Will admit to internal medicine and cardiology will follow.  CRITICAL CARE Performed by: Mackie Pai Total critical care time: 30 Critical care time was exclusive of separately billable procedures and treating other patients. Critical care was necessary to treat or prevent imminent or life-threatening deterioration. Critical care was time spent personally by me on the following activities: development of treatment plan with patient and/or surrogate as well as nursing, discussions with consultants, evaluation of patient's response to treatment, examination of patient, obtaining history from patient or surrogate, ordering and performing treatments and interventions, ordering and review of laboratory studies, ordering and review of radiographic studies, pulse oximetry and re-evaluation of patient's condition.    Davonna Belling, MD 05/26/15 2350

## 2015-05-26 NOTE — ED Notes (Signed)
Pt reports pain all the way across his chest, started yesterday and pt thought it was indigestion. Denies sob.

## 2015-05-27 ENCOUNTER — Encounter (HOSPITAL_COMMUNITY): Payer: Self-pay | Admitting: Internal Medicine

## 2015-05-27 ENCOUNTER — Other Ambulatory Visit (HOSPITAL_COMMUNITY): Payer: Medicaid Other

## 2015-05-27 DIAGNOSIS — G40909 Epilepsy, unspecified, not intractable, without status epilepticus: Secondary | ICD-10-CM

## 2015-05-27 DIAGNOSIS — I2584 Coronary atherosclerosis due to calcified coronary lesion: Secondary | ICD-10-CM | POA: Diagnosis present

## 2015-05-27 DIAGNOSIS — I214 Non-ST elevation (NSTEMI) myocardial infarction: Secondary | ICD-10-CM | POA: Diagnosis not present

## 2015-05-27 DIAGNOSIS — Z9119 Patient's noncompliance with other medical treatment and regimen: Secondary | ICD-10-CM | POA: Diagnosis present

## 2015-05-27 DIAGNOSIS — Z79899 Other long term (current) drug therapy: Secondary | ICD-10-CM | POA: Diagnosis not present

## 2015-05-27 DIAGNOSIS — I2510 Atherosclerotic heart disease of native coronary artery without angina pectoris: Secondary | ICD-10-CM | POA: Diagnosis not present

## 2015-05-27 DIAGNOSIS — I255 Ischemic cardiomyopathy: Secondary | ICD-10-CM | POA: Diagnosis not present

## 2015-05-27 DIAGNOSIS — F17290 Nicotine dependence, other tobacco product, uncomplicated: Secondary | ICD-10-CM | POA: Diagnosis present

## 2015-05-27 DIAGNOSIS — I5022 Chronic systolic (congestive) heart failure: Secondary | ICD-10-CM | POA: Diagnosis present

## 2015-05-27 DIAGNOSIS — I25110 Atherosclerotic heart disease of native coronary artery with unstable angina pectoris: Secondary | ICD-10-CM | POA: Diagnosis not present

## 2015-05-27 DIAGNOSIS — R079 Chest pain, unspecified: Secondary | ICD-10-CM | POA: Diagnosis not present

## 2015-05-27 DIAGNOSIS — I252 Old myocardial infarction: Secondary | ICD-10-CM | POA: Diagnosis not present

## 2015-05-27 DIAGNOSIS — Z7902 Long term (current) use of antithrombotics/antiplatelets: Secondary | ICD-10-CM | POA: Diagnosis not present

## 2015-05-27 DIAGNOSIS — I080 Rheumatic disorders of both mitral and aortic valves: Secondary | ICD-10-CM | POA: Diagnosis present

## 2015-05-27 DIAGNOSIS — E785 Hyperlipidemia, unspecified: Secondary | ICD-10-CM | POA: Diagnosis present

## 2015-05-27 DIAGNOSIS — Z720 Tobacco use: Secondary | ICD-10-CM | POA: Diagnosis not present

## 2015-05-27 DIAGNOSIS — Z7982 Long term (current) use of aspirin: Secondary | ICD-10-CM | POA: Diagnosis not present

## 2015-05-27 DIAGNOSIS — I2582 Chronic total occlusion of coronary artery: Secondary | ICD-10-CM | POA: Diagnosis present

## 2015-05-27 DIAGNOSIS — Z955 Presence of coronary angioplasty implant and graft: Secondary | ICD-10-CM | POA: Diagnosis not present

## 2015-05-27 DIAGNOSIS — I10 Essential (primary) hypertension: Secondary | ICD-10-CM | POA: Diagnosis present

## 2015-05-27 LAB — PROTIME-INR
INR: 1.01 (ref 0.00–1.49)
Prothrombin Time: 13.5 seconds (ref 11.6–15.2)

## 2015-05-27 LAB — HEPARIN LEVEL (UNFRACTIONATED)
HEPARIN UNFRACTIONATED: 0.33 [IU]/mL (ref 0.30–0.70)
HEPARIN UNFRACTIONATED: 0.29 [IU]/mL — AB (ref 0.30–0.70)
Heparin Unfractionated: 0.36 IU/mL (ref 0.30–0.70)

## 2015-05-27 LAB — APTT: APTT: 65 s — AB (ref 24–37)

## 2015-05-27 LAB — CBC
HCT: 36.6 % — ABNORMAL LOW (ref 39.0–52.0)
Hemoglobin: 12.1 g/dL — ABNORMAL LOW (ref 13.0–17.0)
MCH: 28.9 pg (ref 26.0–34.0)
MCHC: 33.1 g/dL (ref 30.0–36.0)
MCV: 87.4 fL (ref 78.0–100.0)
Platelets: 355 10*3/uL (ref 150–400)
RBC: 4.19 MIL/uL — AB (ref 4.22–5.81)
RDW: 16 % — AB (ref 11.5–15.5)
WBC: 5.9 10*3/uL (ref 4.0–10.5)

## 2015-05-27 LAB — COMPREHENSIVE METABOLIC PANEL
ALBUMIN: 3.8 g/dL (ref 3.5–5.0)
ALK PHOS: 58 U/L (ref 38–126)
ALT: 21 U/L (ref 17–63)
ANION GAP: 10 (ref 5–15)
AST: 19 U/L (ref 15–41)
BUN: 8 mg/dL (ref 6–20)
CO2: 27 mmol/L (ref 22–32)
Calcium: 9.2 mg/dL (ref 8.9–10.3)
Chloride: 99 mmol/L — ABNORMAL LOW (ref 101–111)
Creatinine, Ser: 0.93 mg/dL (ref 0.61–1.24)
GFR calc non Af Amer: >60 mL/min (ref >60)
Glucose, Bld: 94 mg/dL (ref 65–99)
Potassium: 4.1 mmol/L (ref 3.5–5.1)
Sodium: 136 mmol/L (ref 135–145)
Total Bilirubin: 0.5 mg/dL (ref 0.3–1.2)
Total Protein: 7.4 g/dL (ref 6.5–8.1)

## 2015-05-27 LAB — TROPONIN I
Troponin I: 0.78 ng/mL (ref <0.031)
Troponin I: 0.6 ng/mL (ref <0.031)
Troponin I: 0.53 ng/mL (ref <0.031)

## 2015-05-27 LAB — TSH: TSH: 1.566 u[IU]/mL (ref 0.350–4.500)

## 2015-05-27 LAB — BRAIN NATRIURETIC PEPTIDE: B Natriuretic Peptide: 43.9 pg/mL (ref 0.0–100.0)

## 2015-05-27 MED ORDER — NITROGLYCERIN IN D5W 200-5 MCG/ML-% IV SOLN
0.0000 ug/min | INTRAVENOUS | Status: DC
  Administered 2015-05-27: 5 ug/min via INTRAVENOUS
  Filled 2015-05-27: qty 250

## 2015-05-27 MED ORDER — LEVETIRACETAM ER 500 MG PO TB24
1000.0000 mg | ORAL_TABLET | Freq: Every day | ORAL | Status: DC
  Administered 2015-05-28 – 2015-05-30 (×3): 1000 mg via ORAL
  Filled 2015-05-27 (×4): qty 2

## 2015-05-27 MED ORDER — ATORVASTATIN CALCIUM 80 MG PO TABS
80.0000 mg | ORAL_TABLET | Freq: Every day | ORAL | Status: DC
  Administered 2015-05-27 – 2015-05-30 (×4): 80 mg via ORAL
  Filled 2015-05-27 (×4): qty 1

## 2015-05-27 MED ORDER — ASPIRIN 325 MG PO TABS: 325.0000 mg | ORAL_TABLET | Freq: Every day | ORAL | Status: DC

## 2015-05-27 MED ORDER — CARVEDILOL 6.25 MG PO TABS
6.2500 mg | ORAL_TABLET | Freq: Two times a day (BID) | ORAL | Status: DC
Start: 2015-05-27 — End: 2015-05-30
  Administered 2015-05-27 – 2015-05-30 (×8): 6.25 mg via ORAL
  Filled 2015-05-27: qty 2
  Filled 2015-05-27 (×7): qty 1

## 2015-05-27 MED ORDER — NITROGLYCERIN 0.4 MG SL SUBL
0.4000 mg | SUBLINGUAL_TABLET | SUBLINGUAL | Status: DC | PRN
  Administered 2015-05-27: 0.4 mg via SUBLINGUAL
  Filled 2015-05-27: qty 1

## 2015-05-27 MED ORDER — ONDANSETRON HCL 4 MG/2ML IJ SOLN: 4.0000 mg | Freq: Four times a day (QID) | INTRAMUSCULAR | Status: DC | PRN

## 2015-05-27 MED ORDER — MORPHINE SULFATE 2 MG/ML IJ SOLN: 2.0000 mg | INTRAMUSCULAR | Status: DC | PRN

## 2015-05-27 MED ORDER — CLOPIDOGREL BISULFATE 75 MG PO TABS
75.0000 mg | ORAL_TABLET | Freq: Every day | ORAL | Status: DC
  Administered 2015-05-27 – 2015-05-29 (×3): 75 mg via ORAL
  Filled 2015-05-27 (×3): qty 1

## 2015-05-27 MED ORDER — LEVETIRACETAM ER 500 MG PO TB24: 1000.0000 mg | ORAL_TABLET | Freq: Every day | ORAL | Status: DC

## 2015-05-27 MED ORDER — ASPIRIN EC 81 MG PO TBEC
81.0000 mg | DELAYED_RELEASE_TABLET | Freq: Every day | ORAL | Status: DC
  Administered 2015-05-27 – 2015-05-30 (×4): 81 mg via ORAL
  Filled 2015-05-27 (×4): qty 1

## 2015-05-27 MED ORDER — PHENOBARBITAL 32.4 MG PO TABS
32.4000 mg | ORAL_TABLET | Freq: Four times a day (QID) | ORAL | Status: DC
  Administered 2015-05-28 – 2015-05-30 (×11): 32.4 mg via ORAL
  Filled 2015-05-27 (×12): qty 1

## 2015-05-27 MED ORDER — HEPARIN BOLUS VIA INFUSION
1300.0000 [IU] | Freq: Once | INTRAVENOUS | Status: AC
  Administered 2015-05-27: 1300 [IU] via INTRAVENOUS
  Filled 2015-05-27: qty 1300

## 2015-05-27 MED ORDER — ACETAMINOPHEN 325 MG PO TABS
650.0000 mg | ORAL_TABLET | ORAL | Status: DC | PRN
  Administered 2015-05-27 – 2015-05-28 (×3): 650 mg via ORAL
  Filled 2015-05-27 (×3): qty 2

## 2015-05-27 NOTE — H&P (Signed)
[facility] History and Physical  Patient: [name]  MRN: [id number]  DOB: June 23, 1961  DOS: the patient was seen and examined on 05/27/2015 PCP: Jenell Milliner, MD  Referring physician: Dr. Alvino Chapel Chief Complaint: Chest pain  HPI: [name] is a 53 y.o. male with Past medical history of coronary artery disease status post PCI, seizure disorder, dyslipidemia, hypertension, active smoker. The patient is presenting with complaints of chest pain the pain is located on the left side. The pain feels similar to his prior MI. The patient also had some shortness of breath along with that. Denies any orthopnea or PND denies any fever or chills or cough denies any nausea or vomiting diarrhea or constipation. He mentions he is compliant with all his medications. He denies any similar chest pain recently. At the time of my evaluation he was chest pain-free. Patient was initially discussed with cardiology recommends the patient to be admitted to medicine service as they do not feel the patient requires any urgent intervention.  The patient is coming from home.  At his baseline ambulates without any support And is independent for most of his ADL manages her medication on his own.  Review of Systems: as mentioned in the history of present illness.  A comprehensive review of the other systems is negative.  Past Medical History  Diagnosis Date  . CAD (coronary artery disease)     a. Overlapping BMS-prox LCx in 2009 b. NSTEMI 2012 due to prox LCx ISR s/p PTCA alone; EF 45% w/ basal inf HK. Had run out of Plavix 1 week prior.  [name] HLD (hyperlipidemia)   . Seizure disorder   . Angina   . Hypertension   . TOBACCO ABUSE 02/22/2009  . Clotting disorder   . Seizures   . Structural heart disease     a. Echo 03/2011: EF 50%, mild LVH, basal-mid posterior and basal inferior wall HK, mild AI, mod MR, mild biatrial enlargement, normal RV size/function, PASP   . MI (myocardial infarction) x 7    Past Surgical History  Procedure Laterality Date  . Coronary angioplasty with stent placement  10/2007    BMS-prox LCx  . Coronary angioplasty  03/2011    PTCA-prox LCx ISR  . Left heart catheterization with coronary angiogram [name] 10/13/2013    Procedure: LEFT HEART CATHETERIZATION WITH CORONARY ANGIOGRAM;  Surgeon: Blane Ohara, MD;  Location: [facility] CATH LAB;  Service: Cardiovascular;  Laterality: [name];   Social History:  reports that he has been smoking Cigars.  He has never used smokeless tobacco. He reports that he does not drink alcohol or use illicit drugs.  Allergies  Allergen Reactions  . Food Allergy Formula Other (See Comments)    Sage Causes seizures:    Family History  Problem Relation Age of Onset  . Coronary artery disease Maternal Aunt   . Heart disease Maternal Uncle   . Stroke Maternal Uncle     Prior to Admission medications   Medication Sig Start Date End Date Taking? Authorizing Provider  acetaminophen (TYLENOL) 500 MG tablet Take 1,000 mg by mouth every 6 (six) hours as needed for mild pain, moderate pain or headache.   Yes Historical Provider, MD  aspirin 325 MG tablet Take 325 mg by mouth daily.   Yes Historical Provider, MD  atorvastatin (LIPITOR) 20 MG tablet Take 20 mg by mouth daily.   Yes Historical Provider, MD  carvedilol (COREG) 6.25 MG tablet Take 6.25 mg by mouth 2 (two) times daily  with a meal.   Yes Historical Provider, MD  clopidogrel (PLAVIX) 75 MG tablet Take 75 mg by mouth daily.   Yes Historical Provider, MD  levETIRAcetam (KEPPRA XR) 500 MG 24 hr tablet Take 1,000 mg by mouth daily.    Yes Historical Provider, MD  nitroGLYCERIN (NITROSTAT) 0.4 MG SL tablet Place 0.4 mg under the tongue every 5 (five) minutes as needed for chest pain.   Yes Historical Provider, MD  PHENobarbital (LUMINAL) 32.4 MG tablet Take 32.4 mg by mouth 4 (four) times daily.    Yes Historical Provider, MD  POTASSIUM PO Take 1 tablet by mouth daily at 12 noon.   Yes  Historical Provider, MD  carvedilol (COREG) 12.5 MG tablet Take 1 tablet (12.5 mg total) by mouth 2 (two) times daily with a meal. Patient not taking: Reported on 05/26/2015 12/30/13   Imogene Burn, PA-C  levETIRAcetam (KEPPRA) 500 MG tablet Take 2 tablets (1,000 mg total) by mouth 2 (two) times daily. Patient not taking: Reported on 01/23/2015 01/20/14   Garvin Fila, MD  lisinopril (PRINIVIL,ZESTRIL) 10 MG tablet Take 1 tablet (10 mg total) by mouth daily. Patient not taking: Reported on 01/23/2015 12/03/13   Sherren Mocha, MD    Physical Exam: Filed Vitals:   05/27/15 0234 05/27/15 0239 05/27/15 0500 05/27/15 0556  BP: 110/74 114/72 96/62 103/65  Pulse: 59 59 60 58  Temp:   98 F (36.7 C)   TempSrc:   Oral   Resp: 9 10 9 10   Height:      Weight:      SpO2: 96% 96% 92% 94%    General: Alert, Awake and Oriented to Time, Place and Person. Appear in mild distress Eyes: PERRL ENT: Oral Mucosa clear moist Neck: non JVD Cardiovascular: S1 and S2 Present, no Murmur, Peripheral Pulses Present Respiratory: Bilateral Air entry equal and Decreased,  Clear to Auscultation, no Crackles, no wheezes Abdomen: Bowel Sound , presentSoft and non tender Skin: no Rash Extremities: Bilateral Pedal edema, no calf tenderness Neurologic: Grossly no focal neuro deficit.  Labs on Admission:  CBC:  Recent Labs Lab 05/26/15 1807 05/27/15 0303  WBC 6.3 5.9  HGB 12.3* 12.1*  HCT 36.9* 36.6*  MCV 87.9 87.4  PLT 365 355    CMP     Component Value Date/Time   NA 136 05/27/2015 0303   K 4.1 05/27/2015 0303   CL 99* 05/27/2015 0303   CO2 27 05/27/2015 0303   GLUCOSE 94 05/27/2015 0303   BUN 8 05/27/2015 0303   CREATININE 0.93 05/27/2015 0303   CALCIUM 9.2 05/27/2015 0303   PROT 7.4 05/27/2015 0303   ALBUMIN 3.8 05/27/2015 0303   AST 19 05/27/2015 0303   ALT 21 05/27/2015 0303   ALKPHOS 58 05/27/2015 0303   BILITOT 0.5 05/27/2015 0303   GFRNONAA >60 05/27/2015 0303   GFRAA >60 05/27/2015  0303    No results for input(s): LIPASE, AMYLASE in the last 168 hours.   Recent Labs Lab 05/27/15 0303  TROPONINI 0.78*   BNP (last 3 results)  Recent Labs  05/27/15 0303  BNP 43.9    ProBNP (last 3 results) No results for input(s): PROBNP in the last 8760 hours.   Radiological Exams on Admission: Dg Chest 2 View  05/26/2015   CLINICAL DATA:  Chest pain and shortness of Breath for 2 days.  EXAM: CHEST  2 VIEW  COMPARISON:  12/09/2013  FINDINGS: The cardiac silhouette, mediastinal and hilar contours are within normal  limits and stable. Coronary artery stents are noted. There is tortuosity of the thoracic aorta. There are mild emphysematous changes and chronic bronchitic type lung changes but no infiltrates, edema or effusions. The bony thorax is intact.  IMPRESSION: Chronic emphysematous and bronchitic type lung changes but no definite acute overlying pulmonary process.   Electronically Signed   By: Marijo Sanes M.D.   On: 05/26/2015 18:36   EKG: Independently reviewed. normal sinus rhythm, new T-wave inversions in lead 3 aVF as well as V6.  Assessment/Plan Principal Problem:   NSTEMI (non-ST elevated myocardial infarction) Active Problems:   TOBACCO ABUSE   Ischemic cardiomyopathy   Seizure disorder   1. NSTEMI (non-ST elevated myocardial infarction) The patient is presenting with complaints of follow-up ischemic changes on the EKG as well as chest pain as well as positive troponin. Cardiology does not think the patient requires any urgent intervention and would like the patient be admitted to medicine service due to his history of seizure disorder. The patient at the time of my evaluation remains chest pain-free. He remains nothing by mouth I would continue with aspirin and Plavix. I would also continue with heparin infusion. The patient will be placed on Lipitor 80. Continue with carvedilol as well. Monitoring step down unit.  2. history of seizure disorder. No  active seizure in last 63-4 months. Continue Keppra as well as phenobarbital at home doses.  3.Essential hypertension. Continue with carvedilol.  Advance goals of care discussion: Full code    Consults: ED discussed with cardiology Dr. Hilda Lias.  DVT Prophylaxis:  Heparin therapeutic anticoagulation. Nutrition: Nothing by mouth   Disposition: Admitted as inpatient, step-down unit.  Author: Berle Mull, MD [facility] Pager: [phone] 05/27/2015  If 7PM-7AM, please contact night-coverage [email or web address] Password [email or web address]

## 2015-05-27 NOTE — Progress Notes (Signed)
Subjective:  Currently chest pain-free on intravenous heparin and nitroglycerin.  Troponins are elevated.  EKG shows inferior T-wave changes  Objective:  Vital Signs in the last 24 hours: BP 103/65 mmHg  Pulse 58  Temp(Src) 98 F (36.7 C) (Oral)  Resp 10  Ht 6\' 2"  (1.88 m)  Wt 86.365 kg (190 lb 6.4 oz)  BMI 24.44 kg/m2  SpO2 94%  Physical Exam: Thin black male currently in no acute distress Lungs:  Clear Cardiac:  Regular rhythm, normal S1 and S2, no S3 Abdomen:  Soft, nontender, no masses Extremities:  No edema present  Intake/Output from previous day: 08/05 0701 - 08/06 0700 In: -  Out: 1175 [Urine:1175]  Weight Filed Weights   05/26/15 1804 05/27/15 0134  Weight: 88.451 kg (195 lb) 86.365 kg (190 lb 6.4 oz)    Lab Results: Basic Metabolic Panel:  Recent Labs  05/26/15 1807 05/27/15 0303  NA 136 136  K 4.4 4.1  CL 99* 99*  CO2 27 27  GLUCOSE 87 94  BUN 10 8  CREATININE 0.98 0.93   CBC:  Recent Labs  05/26/15 1807 05/27/15 0303  WBC 6.3 5.9  HGB 12.3* 12.1*  HCT 36.9* 36.6*  MCV 87.9 87.4  PLT 365 355   Cardiac Enzymes: Troponin (Point of Care Test)  Recent Labs  05/26/15 2226  TROPIPOC 0.58*   Cardiac Panel (last 3 results)  Recent Labs  05/27/15 0303  TROPONINI 0.78*    Telemetry: Currently sinus rhythm  Assessment/Plan:  1.  Chest pain consistent with unstable angina-non-STEMI 2.  Coronary artery disease and moderate left main and three-vessel 3.  Chronic systolic heart failure 4.  History of poor medical compliance  Recommendations:  Has had ongoing symptoms and abnormal troponin.  Likely will need repeat catheterization.  Not sure that myocardial perfusion imaging  will be useful in this situation.       Kerry Hough  MD [facility] Cardiology  05/27/2015, 9:01 AM

## 2015-05-27 NOTE — Progress Notes (Signed)
Patient seen and examined Past medical history of coronary artery disease status post PCI, seizure disorder, dyslipidemia, hypertension, active smoker. Troponin is trending up, now 0.78 Seen by cardiology, currently on heparin drip and nitroglycerin drip, also initiated on Plavix by cardiology, continue aspirin, Coreg He'll probably need a repeat cardiac cath as per W. Doristine Church. MD

## 2015-05-27 NOTE — Progress Notes (Addendum)
ANTICOAGULATION CONSULT NOTE - Follow Up Consult  Pharmacy Consult for Heparin  Indication: chest pain/ACS  Allergies  Allergen Reactions  . Food Allergy Formula Other (See Comments)    Sage Causes seizures:    Patient Measurements: Height: 6\' 2"  (188 cm) Weight: 190 lb 6.4 oz (86.365 kg) IBW/kg (Calculated) : 82.2  Vital Signs: Temp: 98.4 F (36.9 C) (08/06 1355) Temp Source: Oral (08/06 1355) BP: 98/64 mmHg (08/06 1355) Pulse Rate: 72 (08/06 1355)  Labs:  Recent Labs  05/26/15 1807 05/27/15 0303 05/27/15 1031  HGB 12.3* 12.1*  --   HCT 36.9* 36.6*  --   PLT 365 355  --   APTT  --  65*  --   LABPROT  --  13.5  --   INR  --  1.01  --   HEPARINUNFRC  --  0.33 0.29*  CREATININE 0.98 0.93  --   TROPONINI  --  0.78*  --     Estimated Creatinine Clearance: 106.8 mL/min (by C-G formula based on Cr of 0.93).  Assessment: Pt is a 54 yo M who was initiated on a heparin gtt for ACS (trop 0.78). Plan for stress test if pt stays stable or cath if he worsens. He was initially given a 4000 unit heparin bolus, then 1050 units/hr. Initial HL was therapeutic and rate was kept the same.   HL today is subtherapeutic at 0.29, Hgb 12.1, Plt 355.   Goal of Therapy:  Heparin level 0.3-0.7 units/ml Monitor platelets by anticoagulation protocol: Yes   Plan:  -Heparin 1300 unit bolus x1  -increased heparin rate to 1250 units/hr  -Repeat HL 1800 -Daily CBC/HL -Monitor for bleeding   Governor Specking, PharmD Clinical Pharmacy Resident Pager: [phone] 05/27/2015,2:48 PM  ADDENDUM Heparin level this evening therapeutic at 0.36units/mL. No bleeding noted  Plan: -continue heparin infusion at 1250 units/hr -next level with AM labs to confirm  Taeveon Keesling D. Dorrien Grunder, PharmD, BCPS Clinical Pharmacist Pager: [phone] 05/27/2015 8:58 PM

## 2015-05-27 NOTE — Consult Note (Signed)
Primary Cardiologist: Dr. Sherren Mocha - last visit 03/16/14  Reason for Consultation: NSTEMI   HPI: 82 AA man with known CAD, PCI to pLAD and mLCX, moderate LV systolic dysfunction with LVEF 40%, seizure disorder, poor medical compliance with limited resources (now on disability), last cath in 09/2013 by Dr. Burt Knack (noted below), presents to ER today with left sided chest discomfort of moderate intensity, radiating to left arm associated with belching and mild diaphoresis. He has been having episodes of these symptoms for the past couple days. No syncope, fever, N/V, orthopnea, PND, pedal edema. He says he drove himself to ER today. In ER Tni 0.16 and 0.58. He has been started on heparin and NTG infusions. He reports significant improvement in his symptoms. BP is normal. He reports compliance with his medications although it cannot be confirmed.    Review of Systems:  10 systems reviewed unremarkable except as noted in HPI   Past Medical History  Diagnosis Date  . CAD (coronary artery disease)     a. Overlapping BMS-prox LCx in 2009 b. NSTEMI 2012 due to prox LCx ISR s/p PTCA alone; EF 45% w/ basal inf HK. Had run out of Plavix 1 week prior.  [name] HLD (hyperlipidemia)   . Seizure disorder   . Angina   . Hypertension   . TOBACCO ABUSE 02/22/2009  . Clotting disorder   . Seizures   . Structural heart disease     a. Echo 03/2011: EF 50%, mild LVH, basal-mid posterior and basal inferior wall HK, mild AI, mod MR, mild biatrial enlargement, normal RV size/function, PASP   . MI (myocardial infarction) x 7     (Not in a hospital admission)   . aspirin EC  81 mg Oral Daily  . atorvastatin  80 mg Oral q1800  . carvedilol  6.25 mg Oral BID WC  . clopidogrel  75 mg Oral Daily  . levETIRAcetam  1,000 mg Oral Daily  . nitroGLYCERIN  0.4 mg Sublingual Once  . PHENobarbital  32.4 mg Oral QID    Allergies  Allergen Reactions  . Food Allergy Formula Other (See Comments)    Sage  Causes seizures:    History   Social History  . Marital Status: Single    Spouse Name: [name]  . Number of Children: 0  . Years of Education: 12th   Occupational History  . [name]    Social History Main Topics  . Smoking status: Light Tobacco Smoker -- 1.00 packs/day for 35 years    Types: Cigars  . Smokeless tobacco: Never Used  . Alcohol Use: No  . Drug Use: No  . Sexual Activity: Not Currently   Other Topics Concern  . Not on file   Social History Narrative   Patient is homeless           Family History  Problem Relation Age of Onset  . Coronary artery disease Maternal Aunt   . Heart disease Maternal Uncle   . Stroke Maternal Uncle     No current facility-administered medications on file prior to encounter.   Current Outpatient Prescriptions on File Prior to Encounter  Medication Sig Dispense Refill  . aspirin 325 MG tablet Take 325 mg by mouth daily.    . clopidogrel (PLAVIX) 75 MG tablet Take 75 mg by mouth daily.    [name] levETIRAcetam (KEPPRA XR) 500 MG 24 hr tablet Take 1,000 mg by mouth daily.     . nitroGLYCERIN (NITROSTAT) 0.4 MG  SL tablet Place 0.4 mg under the tongue every 5 (five) minutes as needed for chest pain.    [name] PHENobarbital (LUMINAL) 32.4 MG tablet Take 32.4 mg by mouth 4 (four) times daily.     . carvedilol (COREG) 12.5 MG tablet Take 1 tablet (12.5 mg total) by mouth 2 (two) times daily with a meal. (Patient not taking: Reported on 05/26/2015) 60 tablet 11  . levETIRAcetam (KEPPRA) 500 MG tablet Take 2 tablets (1,000 mg total) by mouth 2 (two) times daily. (Patient not taking: Reported on 01/23/2015) 90 tablet 2  . lisinopril (PRINIVIL,ZESTRIL) 10 MG tablet Take 1 tablet (10 mg total) by mouth daily. (Patient not taking: Reported on 01/23/2015) 30 tablet 11     PHYSICAL EXAM: Filed Vitals:   05/27/15 0030  BP: 116/80  Pulse: 66  Temp:   Resp: 16     Intake/Output Summary (Last 24 hours) at 05/27/15 0124 Last data filed at 05/26/15 2313  Gross  per 24 hour  Intake      0 ml  Output   1175 ml  Net  -1175 ml    General:  Well appearing. No respiratory difficulty HEENT: normal Neck: supple. no JVD. Carotids 2+ bilat; no bruits. No lymphadenopathy or thryomegaly appreciated. Cor: PMI nondisplaced. Regular rate & rhythm. No rubs, gallops or murmurs. Lungs: clear Abdomen: soft, nontender, nondistended. No hepatosplenomegaly. No bruits or masses. Good bowel sounds. Extremities: no cyanosis, clubbing, rash, edema Neuro: alert & oriented x 3, cranial nerves grossly intact. moves all 4 extremities w/o difficulty. Affect pleasant.  ECG: SR, early repolarization, possible LVH, T inv inf leads  Results for orders placed or performed during the hospital encounter of 05/26/15 (from the past 24 hour(s))  Basic metabolic panel     Status: Abnormal   Collection Time: 05/26/15  6:07 PM  Result Value Ref Range   Sodium 136 135 - 145 mmol/L   Potassium 4.4 3.5 - 5.1 mmol/L   Chloride 99 (L) 101 - 111 mmol/L   CO2 27 22 - 32 mmol/L   Glucose, Bld 87 65 - 99 mg/dL   BUN 10 6 - 20 mg/dL   Creatinine, Ser 0.98 0.61 - 1.24 mg/dL   Calcium 9.6 8.9 - 10.3 mg/dL   GFR calc non Af Amer >60 >60 mL/min   GFR calc Af Amer >60 >60 mL/min   Anion gap 10 5 - 15  CBC     Status: Abnormal   Collection Time: 05/26/15  6:07 PM  Result Value Ref Range   WBC 6.3 4.0 - 10.5 K/uL   RBC 4.20 (L) 4.22 - 5.81 MIL/uL   Hemoglobin 12.3 (L) 13.0 - 17.0 g/dL   HCT 36.9 (L) 39.0 - 52.0 %   MCV 87.9 78.0 - 100.0 fL   MCH 29.3 26.0 - 34.0 pg   MCHC 33.3 30.0 - 36.0 g/dL   RDW 16.0 (H) 11.5 - 15.5 %   Platelets 365 150 - 400 K/uL  I-stat troponin, ED     Status: Abnormal   Collection Time: 05/26/15  6:28 PM  Result Value Ref Range   Troponin i, poc 0.16 (HH) 0.00 - 0.08 ng/mL   Comment NOTIFIED PHYSICIAN    Comment 3          Phenobarbital level     Status: None   Collection Time: 05/26/15  7:20 PM  Result Value Ref Range   Phenobarbital 15.9 15.0 - 40.0  ug/mL  I-Stat Troponin, ED (not at [facility])  Status: Abnormal   Collection Time: 05/26/15 10:26 PM  Result Value Ref Range   Troponin i, poc 0.58 (HH) 0.00 - 0.08 ng/mL   Comment NOTIFIED PHYSICIAN    Comment 3           Dg Chest 2 View  05/26/2015   CLINICAL DATA:  Chest pain and shortness of Breath for 2 days.  EXAM: CHEST  2 VIEW  COMPARISON:  12/09/2013  FINDINGS: The cardiac silhouette, mediastinal and hilar contours are within normal limits and stable. Coronary artery stents are noted. There is tortuosity of the thoracic aorta. There are mild emphysematous changes and chronic bronchitic type lung changes but no infiltrates, edema or effusions. The bony thorax is intact.  IMPRESSION: Chronic emphysematous and bronchitic type lung changes but no definite acute overlying pulmonary process.   Electronically Signed   By: Marijo Sanes M.D.   On: 05/26/2015 18:36    Cath 10/13/13 Procedural Findings: Hemodynamics: AO 112/66 LV 114/14 Coronary angiography: Coronary dominance: right  Left mainstem: Large vessel proximally, tapers with 50% distal left main stenosis with a shelf-like plaque. Divides into the LAD and left circumflex the Left anterior descending (LAD): The LAD is a large-caliber vessel. Proximally the vessel is widely patent. The mid vessel has 40-50% stenosis. The first diagonal branch is patent without significant stenosis. The vessel wraps around the left ventricular apex and is large in caliber even into the distal segment. Left circumflex (LCx): The left circumflex has 50% ostial stenosis. There is an intermediate branch with 70-75% stenosis which is unchanged from previous studies. The stented segment in the mid circumflex is patent with mild diffuse in-stent restenosis of 30%. Beyond the stented segment there is nonobstructive 30-40% stenosis leading into a single obtuse marginal branch Right coronary artery (RCA): The vessel is totally occluded in its proximal aspect. It fills  from left to right collaterals. This is unchanged from the previous study.  Intravascular ultrasound findings. The mid LAD reference vessel is widely patent with no atherosclerosis. The luminal area is 12 mm. The mid LAD lesion site has a minimal lumen area of 5.4 and percent stenosis of 55. The distal left main has eccentric plaque with a minimal lumen area of 7.5 mm. The proximal left main is very large in caliber.  Left ventriculography: The basal and midinferior wall are akinetic. The remaining portions of left ventricle contract normally. The estimated left ventricular ejection fraction is 40%.  Final Conclusions:  1. Mild to Moderate stenosis of the distal left main, ostial circumflex, and ramus intermedius, and the mid LAD 2. Continued patency of the stented segment in the proximal left circumflex. 3. Total occlusion of the right coronary artery. 4. Moderate segmental left ventricular systolic dysfunction       ASSESSMENT:  1. NSTE-ACS with possible NSTEMI   PLAN/DISCUSSION:  - Agree with medical management for now with continuation of ASA, Plavix - Agree with high potency statin, heparin infusion, beta blocker, echo. Up titrate beta blocker if tolerated by HR and BP.  - Wean off NTG infusion if no angina - Stressed medical compliance. Now he says he has received disability about a month ago so he will try to be more compliant.   If remains stable then will consider myocardial perfusion imaging. Cath if ongoing symptoms or hemodynamic instability.   Cardiology consult service will follow.     Wandra Mannan, MD Cardiology

## 2015-05-27 NOTE — Progress Notes (Signed)
Nurse called me back into the room to talk to the patient again.  I again went over recommendations earlier that he had a significant heart problem with abnormal EKG and elevated troponins.  The patient wants to take his IV out and leave the hospital.  I explained to him that he would be at risk of a heart attack or death if he did so.  Current cardiology recommendations would be to continue heparin and nitroglycerin over the weekend and have cardiac catheterization on Monday.  Kerry Hough MD [facility]

## 2015-05-27 NOTE — ED Notes (Signed)
Dr. Patel at bedside 

## 2015-05-27 NOTE — Discharge Summary (Addendum)
Physician Discharge Summary  [name] MRN: [id number] DOB/AGE: 53-Sep-1962 53 y.o.  PCP: Jenell Milliner, MD   Admit date: 05/26/2015 Discharge date: 05/27/2015  Discharge Diagnoses:     Principal Problem:   NSTEMI (non-ST elevated myocardial infarction) Active Problems:   TOBACCO ABUSE   Ischemic cardiomyopathy   Seizure disorder  patient would like to MEDICAL advice     Patient refused to stay for cardiac catheterization on Monday      Medication List    ASK your doctor about these medications        acetaminophen 500 MG tablet  Commonly known as:  TYLENOL  Take 1,000 mg by mouth every 6 (six) hours as needed for mild pain, moderate pain or headache.     aspirin 325 MG tablet  Take 325 mg by mouth daily.     atorvastatin 20 MG tablet  Commonly known as:  LIPITOR  Take 20 mg by mouth daily.     carvedilol 6.25 MG tablet  Commonly known as:  COREG  Take 6.25 mg by mouth 2 (two) times daily with a meal.     carvedilol 12.5 MG tablet  Commonly known as:  COREG  Take 1 tablet (12.5 mg total) by mouth 2 (two) times daily with a meal.     clopidogrel 75 MG tablet  Commonly known as:  PLAVIX  Take 75 mg by mouth daily.     levETIRAcetam 500 MG 24 hr tablet  Commonly known as:  KEPPRA XR  Take 1,000 mg by mouth daily.     levETIRAcetam 500 MG tablet  Commonly known as:  KEPPRA  Take 2 tablets (1,000 mg total) by mouth 2 (two) times daily.     lisinopril 10 MG tablet  Commonly known as:  PRINIVIL,ZESTRIL  Take 1 tablet (10 mg total) by mouth daily.     nitroGLYCERIN 0.4 MG SL tablet  Commonly known as:  NITROSTAT  Place 0.4 mg under the tongue every 5 (five) minutes as needed for chest pain.     PHENobarbital 32.4 MG tablet  Commonly known as:  LUMINAL  Take 32.4 mg by mouth 4 (four) times daily.     POTASSIUM PO  Take 1 tablet by mouth daily at 12 noon.         Discharge Condition:Guarded    Consults:  cardiology  Significant Diagnostic  Studies:  Dg Chest 2 View  05/26/2015   CLINICAL DATA:  Chest pain and shortness of Breath for 2 days.  EXAM: CHEST  2 VIEW  COMPARISON:  12/09/2013  FINDINGS: The cardiac silhouette, mediastinal and hilar contours are within normal limits and stable. Coronary artery stents are noted. There is tortuosity of the thoracic aorta. There are mild emphysematous changes and chronic bronchitic type lung changes but no infiltrates, edema or effusions. The bony thorax is intact.  IMPRESSION: Chronic emphysematous and bronchitic type lung changes but no definite acute overlying pulmonary process.   Electronically Signed   By: Marijo Sanes M.D.   On: 05/26/2015 18:36        Filed Weights   05/26/15 1804 05/27/15 0134  Weight: 88.451 kg (195 lb) 86.365 kg (190 lb 6.4 oz)     Microbiology: No results found for this or any previous visit (from the past 240 hour(s)).     Blood Culture    Component Value Date/Time   SDES URINE, RANDOM 02/04/2009 0636   SPECREQUEST NONE 02/04/2009 0636   CULT NO GROWTH 02/04/2009 0636  REPTSTATUS 02/05/2009 FINAL 02/04/2009 0636      Labs: Results for orders placed or performed during the hospital encounter of 05/26/15 (from the past 48 hour(s))  Basic metabolic panel     Status: Abnormal   Collection Time: 05/26/15  6:07 PM  Result Value Ref Range   Sodium 136 135 - 145 mmol/L   Potassium 4.4 3.5 - 5.1 mmol/L   Chloride 99 (L) 101 - 111 mmol/L   CO2 27 22 - 32 mmol/L   Glucose, Bld 87 65 - 99 mg/dL   BUN 10 6 - 20 mg/dL   Creatinine, Ser 1.88 0.61 - 1.24 mg/dL   Calcium 9.6 8.9 - 63.6 mg/dL   GFR calc non Af Amer >60 >60 mL/min   GFR calc Af Amer >60 >60 mL/min    Comment: (NOTE) The eGFR has been calculated using the CKD EPI equation. This calculation has not been validated in all clinical situations. eGFR's persistently <60 mL/min signify possible Chronic Kidney Disease.    Anion gap 10 5 - 15  CBC     Status: Abnormal   Collection Time:  05/26/15  6:07 PM  Result Value Ref Range   WBC 6.3 4.0 - 10.5 K/uL   RBC 4.20 (L) 4.22 - 5.81 MIL/uL   Hemoglobin 12.3 (L) 13.0 - 17.0 g/dL   HCT 11.0 (L) 38.5 - 11.4 %   MCV 87.9 78.0 - 100.0 fL   MCH 29.3 26.0 - 34.0 pg   MCHC 33.3 30.0 - 36.0 g/dL   RDW 68.5 (H) 55.7 - 37.1 %   Platelets 365 150 - 400 K/uL  I-stat troponin, ED     Status: Abnormal   Collection Time: 05/26/15  6:28 PM  Result Value Ref Range   Troponin i, poc 0.16 (HH) 0.00 - 0.08 ng/mL   Comment NOTIFIED PHYSICIAN    Comment 3            Comment: Due to the release kinetics of cTnI, a negative result within the first hours of the onset of symptoms does not rule out myocardial infarction with certainty. If myocardial infarction is still suspected, repeat the test at appropriate intervals.   Phenobarbital level     Status: None   Collection Time: 05/26/15  7:20 PM  Result Value Ref Range   Phenobarbital 15.9 15.0 - 40.0 ug/mL  I-Stat Troponin, ED (not at [facility])     Status: Abnormal   Collection Time: 05/26/15 10:26 PM  Result Value Ref Range   Troponin i, poc 0.58 (HH) 0.00 - 0.08 ng/mL   Comment NOTIFIED PHYSICIAN    Comment 3            Comment: Due to the release kinetics of cTnI, a negative result within the first hours of the onset of symptoms does not rule out myocardial infarction with certainty. If myocardial infarction is still suspected, repeat the test at appropriate intervals.   CBC     Status: Abnormal   Collection Time: 05/27/15  3:03 AM  Result Value Ref Range   WBC 5.9 4.0 - 10.5 K/uL   RBC 4.19 (L) 4.22 - 5.81 MIL/uL   Hemoglobin 12.1 (L) 13.0 - 17.0 g/dL   HCT 73.0 (L) 70.3 - 37.7 %   MCV 87.4 78.0 - 100.0 fL   MCH 28.9 26.0 - 34.0 pg   MCHC 33.1 30.0 - 36.0 g/dL   RDW 80.7 (H) 81.2 - 78.1 %   Platelets 355 150 - 400 K/uL  Heparin level (unfractionated)     Status: None   Collection Time: 05/27/15  3:03 AM  Result Value Ref Range   Heparin Unfractionated 0.33 0.30 - 0.70 IU/mL     Comment:        IF HEPARIN RESULTS ARE BELOW EXPECTED VALUES, AND PATIENT DOSAGE HAS BEEN CONFIRMED, SUGGEST FOLLOW UP TESTING OF ANTITHROMBIN III LEVELS.   Comprehensive metabolic panel     Status: Abnormal   Collection Time: 05/27/15  3:03 AM  Result Value Ref Range   Sodium 136 135 - 145 mmol/L   Potassium 4.1 3.5 - 5.1 mmol/L   Chloride 99 (L) 101 - 111 mmol/L   CO2 27 22 - 32 mmol/L   Glucose, Bld 94 65 - 99 mg/dL   BUN 8 6 - 20 mg/dL   Creatinine, Ser 0.93 0.61 - 1.24 mg/dL   Calcium 9.2 8.9 - 10.3 mg/dL   Total Protein 7.4 6.5 - 8.1 g/dL   Albumin 3.8 3.5 - 5.0 g/dL   AST 19 15 - 41 U/L   ALT 21 17 - 63 U/L   Alkaline Phosphatase 58 38 - 126 U/L   Total Bilirubin 0.5 0.3 - 1.2 mg/dL   GFR calc non Af Amer >60 >60 mL/min   GFR calc Af Amer >60 >60 mL/min    Comment: (NOTE) The eGFR has been calculated using the CKD EPI equation. This calculation has not been validated in all clinical situations. eGFR's persistently <60 mL/min signify possible Chronic Kidney Disease.    Anion gap 10 5 - 15  TSH     Status: None   Collection Time: 05/27/15  3:03 AM  Result Value Ref Range   TSH 1.566 0.350 - 4.500 uIU/mL  Troponin I     Status: Abnormal   Collection Time: 05/27/15  3:03 AM  Result Value Ref Range   Troponin I 0.78 (HH) <0.031 ng/mL    Comment:        POSSIBLE MYOCARDIAL ISCHEMIA. SERIAL TESTING RECOMMENDED. REPEATED TO VERIFY CRITICAL RESULT CALLED TO, READ BACK BY AND VERIFIED WITH: C.RALPH,RN [id number] 05/27/15 M.CAMPBELL   Protime-INR     Status: None   Collection Time: 05/27/15  3:03 AM  Result Value Ref Range   Prothrombin Time 13.5 11.6 - 15.2 seconds   INR 1.01 0.00 - 1.49  APTT     Status: Abnormal   Collection Time: 05/27/15  3:03 AM  Result Value Ref Range   aPTT 65 (H) 24 - 37 seconds    Comment:        IF BASELINE aPTT IS ELEVATED, SUGGEST PATIENT RISK ASSESSMENT BE USED TO DETERMINE APPROPRIATE ANTICOAGULANT THERAPY.   Brain natriuretic  peptide     Status: None   Collection Time: 05/27/15  3:03 AM  Result Value Ref Range   B Natriuretic Peptide 43.9 0.0 - 100.0 pg/mL     Lipid Panel     Component Value Date/Time   CHOL 162 04/07/2011 0151   TRIG 62 04/07/2011 0151   HDL 36* 04/07/2011 0151   CHOLHDL 4.5 04/07/2011 0151   VLDL 12 04/07/2011 0151   LDLCALC 114* 04/07/2011 0151     No results found for: HGBA1C   Lab Results  Component Value Date   LDLCALC 114* 04/07/2011   CREATININE 0.93 05/27/2015     HPI :64 AA man with known CAD, PCI to pLAD and mLCX, moderate LV systolic dysfunction with LVEF 40%, seizure disorder, poor medical compliance with limited resources (now on  disability), last cath in 09/2013 by Dr. Burt Knack (noted below), presents to ER today with left sided chest discomfort of moderate intensity, radiating to left arm associated with belching and mild diaphoresis. He has been having episodes of these symptoms for the past couple days. No syncope, fever, N/V, orthopnea, PND, pedal edema. He says he drove himself to ER today. In ER Tni 0.16 and 0.58. He has been started on heparin and NTG infusions. He reports significant improvement in his symptoms. BP is normal. He reports compliance with his medications although it cannot be confirmed   HOSPITAL COURSE:   1. Chest pain consistent with unstable angina-non-STEMI 2. Coronary artery disease and moderate left main and three-vessel 3. Chronic systolic heart failure 4. History of poor medical compliance  Seen by cardiology, currently on heparin drip and nitroglycerin drip, also initiated on Plavix by cardiology, continue aspirin, Coreg  Patient refuses to wait for cardiac cath until Monday and has left AGAINST MEDICAL ADVICE  ,  Discharge Exam:    Blood pressure 103/65, pulse 58, temperature 98 F (36.7 C), temperature source Oral, resp. rate 10, height $RemoveBe'6\' 2"'UFbekDKSi$  (1.88 m), weight 86.365 kg (190 lb 6.4 oz), SpO2 94  %.         SignedReyne Dumas 05/27/2015, 11:34 AM        Time spent >45 mins

## 2015-05-27 NOTE — Progress Notes (Signed)
ANTICOAGULATION CONSULT NOTE - Follow Up Consult  Pharmacy Consult for Heparin  Indication: chest pain/ACS  Allergies  Allergen Reactions  . Food Allergy Formula Other (See Comments)    Sage Causes seizures:    Patient Measurements: Height: 6\' 2"  (188 cm) Weight: 195 lb (88.451 kg) IBW/kg (Calculated) : 82.2  Vital Signs: Temp: 98.3 F (36.8 C) (08/06 0134) Temp Source: Oral (08/06 0134) BP: 114/72 mmHg (08/06 0239) Pulse Rate: 59 (08/06 0239)  Labs:  Recent Labs  05/26/15 1807 05/27/15 0303  HGB 12.3* 12.1*  HCT 36.9* 36.6*  PLT 365 355  HEPARINUNFRC  --  0.33  CREATININE 0.98 0.93    Estimated Creatinine Clearance: 106.8 mL/min (by C-G formula based on Cr of 0.93).  Assessment: Initial heparin level is therapeutic, looks like plan for stress test if pt stays stable or cath if worsens.   Goal of Therapy:  Heparin level 0.3-0.7 units/ml Monitor platelets by anticoagulation protocol: Yes   Plan:  -Continue heparin at 1050 units/hr -1000 HL -Daily CBC/HL -Monitor for bleeding  Narda Bonds 05/27/2015,4:51 AM

## 2015-05-27 NOTE — ED Notes (Signed)
Cardiology at bedside.

## 2015-05-28 ENCOUNTER — Inpatient Hospital Stay (HOSPITAL_COMMUNITY): Payer: Medicare Other

## 2015-05-28 DIAGNOSIS — R079 Chest pain, unspecified: Secondary | ICD-10-CM

## 2015-05-28 LAB — CBC
HCT: 35 % — ABNORMAL LOW (ref 39.0–52.0)
HEMOGLOBIN: 11.4 g/dL — AB (ref 13.0–17.0)
MCH: 28.6 pg (ref 26.0–34.0)
MCHC: 32.6 g/dL (ref 30.0–36.0)
MCV: 87.7 fL (ref 78.0–100.0)
PLATELETS: 347 10*3/uL (ref 150–400)
RBC: 3.99 MIL/uL — ABNORMAL LOW (ref 4.22–5.81)
RDW: 15.8 % — ABNORMAL HIGH (ref 11.5–15.5)
WBC: 6.6 10*3/uL (ref 4.0–10.5)

## 2015-05-28 LAB — COMPREHENSIVE METABOLIC PANEL
ALT: 18 U/L (ref 17–63)
ANION GAP: 8 (ref 5–15)
AST: 17 U/L (ref 15–41)
Albumin: 3.7 g/dL (ref 3.5–5.0)
Alkaline Phosphatase: 61 U/L (ref 38–126)
BUN: 12 mg/dL (ref 6–20)
CHLORIDE: 101 mmol/L (ref 101–111)
CO2: 26 mmol/L (ref 22–32)
CREATININE: 1.02 mg/dL (ref 0.61–1.24)
Calcium: 8.9 mg/dL (ref 8.9–10.3)
GFR calc non Af Amer: >60 mL/min (ref >60)
Glucose, Bld: 89 mg/dL (ref 65–99)
Potassium: 4 mmol/L (ref 3.5–5.1)
Sodium: 135 mmol/L (ref 135–145)
Total Bilirubin: 0.5 mg/dL (ref 0.3–1.2)
Total Protein: 7.2 g/dL (ref 6.5–8.1)

## 2015-05-28 LAB — HEPARIN LEVEL (UNFRACTIONATED): Heparin Unfractionated: 0.41 IU/mL (ref 0.30–0.70)

## 2015-05-28 MED ORDER — NICOTINE 14 MG/24HR TD PT24
14.0000 mg | MEDICATED_PATCH | Freq: Every day | TRANSDERMAL | Status: DC
  Administered 2015-05-28 – 2015-05-30 (×3): 14 mg via TRANSDERMAL
  Filled 2015-05-28 (×3): qty 1

## 2015-05-28 MED ORDER — SODIUM CHLORIDE 0.9 % WEIGHT BASED INFUSION
1.0000 mL/kg/h | INTRAVENOUS | Status: DC
  Administered 2015-05-29: 1 mL/kg/h via INTRAVENOUS

## 2015-05-28 MED ORDER — SODIUM CHLORIDE 0.9 % IJ SOLN: 3.0000 mL | INTRAMUSCULAR | Status: DC | PRN

## 2015-05-28 MED ORDER — SODIUM CHLORIDE 0.9 % IV SOLN: 250.0000 mL | INTRAVENOUS | Status: DC | PRN

## 2015-05-28 MED ORDER — SODIUM CHLORIDE 0.9 % IJ SOLN
3.0000 mL | Freq: Two times a day (BID) | INTRAMUSCULAR | Status: DC
  Administered 2015-05-28: 3 mL via INTRAVENOUS

## 2015-05-28 MED ORDER — ASPIRIN 81 MG PO CHEW
81.0000 mg | CHEWABLE_TABLET | ORAL | Status: AC
  Administered 2015-05-29: 81 mg via ORAL
  Filled 2015-05-28: qty 1

## 2015-05-28 MED ORDER — SODIUM CHLORIDE 0.9 % WEIGHT BASED INFUSION
3.0000 mL/kg/h | INTRAVENOUS | Status: DC
  Administered 2015-05-29: 3 mL/kg/h via INTRAVENOUS

## 2015-05-28 MED ORDER — SODIUM CHLORIDE 0.9 % IV SOLN
INTRAVENOUS | Status: AC
  Administered 2015-05-28: 75 mL/h via INTRAVENOUS

## 2015-05-28 NOTE — Progress Notes (Signed)
ANTICOAGULATION CONSULT NOTE - Follow Up Consult  Pharmacy Consult for Heparin  Indication: chest pain/ACS  Allergies  Allergen Reactions  . Food Allergy Formula Other (See Comments)    Sage Causes seizures:    Patient Measurements: Height: 6\' 2"  (188 cm) Weight: 189 lb 14.4 oz (86.138 kg) IBW/kg (Calculated) : 82.2  Vital Signs: Temp: 97.9 F (36.6 C) (08/07 0421) Temp Source: Oral (08/07 0421) BP: 106/72 mmHg (08/07 0421) Pulse Rate: 65 (08/07 0421)  Labs:  Recent Labs  05/26/15 1807  05/27/15 0303 05/27/15 1031 05/27/15 1354 05/27/15 1914 05/28/15 0406  HGB 12.3*  --  12.1*  --   --   --  11.4*  HCT 36.9*  --  36.6*  --   --   --  35.0*  PLT 365  --  355  --   --   --  347  APTT  --   --  65*  --   --   --   --   LABPROT  --   --  13.5  --   --   --   --   INR  --   --  1.01  --   --   --   --   HEPARINUNFRC  --   < > 0.33 0.29*  --  0.36 0.41  CREATININE 0.98  --  0.93  --   --   --  1.02  TROPONINI  --   --  0.78*  --  0.60* 0.53*  --   < > = values in this interval not displayed.  Estimated Creatinine Clearance: 97.4 mL/min (by C-G formula based on Cr of 1.02).  Assessment: Heparin level is therapeutic x 2   Goal of Therapy:  Heparin level 0.3-0.7 units/ml Monitor platelets by anticoagulation protocol: Yes   Plan:  -Continue heparin at 1250 units/hr -Daily CBC/HL -Monitor for bleeding  Narda Bonds 05/28/2015,5:59 AM

## 2015-05-28 NOTE — Progress Notes (Signed)
UR COMPLETED  

## 2015-05-28 NOTE — Progress Notes (Signed)
[facility] PROGRESS NOTE  [name] QZE:[id number] DOB: Sep 17, 1961 DOA: 05/26/2015 PCP: Jenell Milliner, MD  Assessment/Plan: Principal Problem:   NSTEMI (non-ST elevated myocardial infarction) Active Problems:   TOBACCO ABUSE   Ischemic cardiomyopathy   Seizure disorder     Non-ST elevation MI, unstable angina-continue nitroglycerin, IV heparin, cardiac cath in the morning, troponin trending down, chest pain-free, continue Plavix, Coreg, nothing by mouth after midnight, 2-D echo pending   Chronic systolic heart failure without exacerbation  Dyslipidemia-continue Lipitor  Seizure disorder-continue Keppra, phenobarbital  Hypertension-continue Coreg, holding lisinopril because of soft blood pressure  Nicotine dependence     Code Status:      Code Status Orders        Start     Ordered   05/27/15 0110  Full code   Continuous     05/27/15 0111     Family Communication: family updated about patient's clinical progress Disposition Plan:  As above    Brief narrative: 89 AA man with known CAD, PCI to pLAD and mLCX, moderate LV systolic dysfunction with LVEF 40%, seizure disorder, poor medical compliance with limited resources (now on disability), last cath in 09/2013 by Dr. Burt Knack (noted below), presents to ER today with left sided chest discomfort of moderate intensity, radiating to left arm associated with belching and mild diaphoresis. He has been having episodes of these symptoms for the past couple days. No syncope, fever, N/V, orthopnea, PND, pedal edema. He says he drove himself to ER today. In ER Tni 0.16 and 0.58. He has been started on heparin and NTG infusions. He reports significant improvement in his symptoms. BP is normal. He reports compliance with his medications although it cannot be confirmed  Consultants:  Cardiology  Procedures:  None  Antibiotics: Anti-infectives    None         HPI/Subjective: Patient decided to stay in the  hospital overnight, chest pain-free, no shortness of breath  Objective: Filed Vitals:   05/27/15 2346 05/28/15 0421 05/28/15 0722 05/28/15 1119  BP: 103/66 106/72    Pulse: 73 65    Temp: 98.5 F (36.9 C) 97.9 F (36.6 C) 98.6 F (37 C) 98.7 F (37.1 C)  TempSrc: Oral Oral Oral Oral  Resp: 13 18    Height:      Weight:  86.138 kg (189 lb 14.4 oz)    SpO2: 96% 96%      Intake/Output Summary (Last 24 hours) at 05/28/15 1142 Last data filed at 05/28/15 1120  Gross per 24 hour  Intake   1200 ml  Output   3750 ml  Net  -2550 ml    Exam:  General: No acute respiratory distress Lungs: Clear to auscultation bilaterally without wheezes or crackles Cardiovascular: Regular rate and rhythm without murmur gallop or rub normal S1 and S2 Abdomen: Nontender, nondistended, soft, bowel sounds positive, no rebound, no ascites, no appreciable mass Extremities: No significant cyanosis, clubbing, or edema bilateral lower extremities     Data Review   Micro Results No results found for this or any previous visit (from the past 240 hour(s)).  Radiology Reports Dg Chest 2 View  05/26/2015   CLINICAL DATA:  Chest pain and shortness of Breath for 2 days.  EXAM: CHEST  2 VIEW  COMPARISON:  12/09/2013  FINDINGS: The cardiac silhouette, mediastinal and hilar contours are within normal limits and stable. Coronary artery stents are noted. There is tortuosity of the thoracic aorta. There are mild  emphysematous changes and chronic bronchitic type lung changes but no infiltrates, edema or effusions. The bony thorax is intact.  IMPRESSION: Chronic emphysematous and bronchitic type lung changes but no definite acute overlying pulmonary process.   Electronically Signed   By: Marijo Sanes M.D.   On: 05/26/2015 18:36     CBC  Recent Labs Lab 05/26/15 1807 05/27/15 0303 05/28/15 0406  WBC 6.3 5.9 6.6  HGB 12.3* 12.1* 11.4*  HCT 36.9* 36.6* 35.0*  PLT 365 355 347  MCV 87.9 87.4 87.7  MCH 29.3 28.9  28.6  MCHC 33.3 33.1 32.6  RDW 16.0* 16.0* 15.8*    Chemistries   Recent Labs Lab 05/26/15 1807 05/27/15 0303 05/28/15 0406  NA 136 136 135  K 4.4 4.1 4.0  CL 99* 99* 101  CO2 27 27 26   GLUCOSE 87 94 89  BUN 10 8 12   CREATININE 0.98 0.93 1.02  CALCIUM 9.6 9.2 8.9  AST  --  19 17  ALT  --  21 18  ALKPHOS  --  58 61  BILITOT  --  0.5 0.5   ------------------------------------------------------------------------------------------------------------------ estimated creatinine clearance is 97.4 mL/min (by C-G formula based on Cr of 1.02). ------------------------------------------------------------------------------------------------------------------ No results for input(s): HGBA1C in the last 72 hours. ------------------------------------------------------------------------------------------------------------------ No results for input(s): CHOL, HDL, LDLCALC, TRIG, CHOLHDL, LDLDIRECT in the last 72 hours. ------------------------------------------------------------------------------------------------------------------  Recent Labs  05/27/15 0303  TSH 1.566   ------------------------------------------------------------------------------------------------------------------ No results for input(s): VITAMINB12, FOLATE, FERRITIN, TIBC, IRON, RETICCTPCT in the last 72 hours.  Coagulation profile  Recent Labs Lab 05/27/15 0303  INR 1.01    No results for input(s): DDIMER in the last 72 hours.  Cardiac Enzymes  Recent Labs Lab 05/27/15 0303 05/27/15 1354 05/27/15 1914  TROPONINI 0.78* 0.60* 0.53*   ------------------------------------------------------------------------------------------------------------------ Invalid input(s): POCBNP   CBG: No results for input(s): GLUCAP in the last 168 hours.     Studies: Dg Chest 2 View  05/26/2015   CLINICAL DATA:  Chest pain and shortness of Breath for 2 days.  EXAM: CHEST  2 VIEW  COMPARISON:  12/09/2013  FINDINGS:  The cardiac silhouette, mediastinal and hilar contours are within normal limits and stable. Coronary artery stents are noted. There is tortuosity of the thoracic aorta. There are mild emphysematous changes and chronic bronchitic type lung changes but no infiltrates, edema or effusions. The bony thorax is intact.  IMPRESSION: Chronic emphysematous and bronchitic type lung changes but no definite acute overlying pulmonary process.   Electronically Signed   By: Marijo Sanes M.D.   On: 05/26/2015 18:36      No results found for: HGBA1C Lab Results  Component Value Date   LDLCALC 114* 04/07/2011   CREATININE 1.02 05/28/2015       Scheduled Meds: . aspirin EC  81 mg Oral Daily  . atorvastatin  80 mg Oral q1800  . carvedilol  6.25 mg Oral BID WC  . clopidogrel  75 mg Oral Daily  . levETIRAcetam  1,000 mg Oral Daily  . nitroGLYCERIN  0.4 mg Sublingual Once  . PHENobarbital  32.4 mg Oral QID   Continuous Infusions: . heparin 1,250 Units/hr (05/27/15 1708)  . nitroGLYCERIN 15 mcg/min (05/27/15 0552)    Principal Problem:   NSTEMI (non-ST elevated myocardial infarction) Active Problems:   TOBACCO ABUSE   Ischemic cardiomyopathy   Seizure disorder    Time spent: 45 minutes   [facility] Pager [phone]. If 7PM-7AM, please contact night-coverage at [email or web address], password [facility] 05/28/2015,  11:42 AM  LOS: 1 day

## 2015-05-28 NOTE — Progress Notes (Signed)
Subjective:  He decided to stay in the hospital after our discussion yesterday although he was threatening to sign out against advice.  No ischemic chest pain this morning.  Complains of sharp atypical chest pain lasting seconds.  No shortness of breath.  Is on heparin and IV nitroglycerin.  Objective:  Vital Signs in the last 24 hours: BP 106/72 mmHg  Pulse 65  Temp(Src) 98.6 F (37 C) (Oral)  Resp 18  Ht 6\' 2"  (1.88 m)  Wt 86.138 kg (189 lb 14.4 oz)  BMI 24.37 kg/m2  SpO2 96%  Physical Exam: Thin black male currently in no acute distress Lungs:  Clear Cardiac:  Regular rhythm, normal S1 and S2, no S3 Abdomen:  Soft, nontender, no masses Extremities:  No edema present  Intake/Output from previous day: 08/06 0701 - 08/07 0700 In: 1200 [P.O.:1200] Out: 3000 [Urine:3000]  Weight Filed Weights   05/26/15 1804 05/27/15 0134 05/28/15 0421  Weight: 88.451 kg (195 lb) 86.365 kg (190 lb 6.4 oz) 86.138 kg (189 lb 14.4 oz)    Lab Results: Basic Metabolic Panel:  Recent Labs  05/27/15 0303 05/28/15 0406  NA 136 135  K 4.1 4.0  CL 99* 101  CO2 27 26  GLUCOSE 94 89  BUN 8 12  CREATININE 0.93 1.02   CBC:  Recent Labs  05/27/15 0303 05/28/15 0406  WBC 5.9 6.6  HGB 12.1* 11.4*  HCT 36.6* 35.0*  MCV 87.4 87.7  PLT 355 347   Cardiac Enzymes: Troponin (Point of Care Test)  Recent Labs  05/26/15 2226  TROPIPOC 0.58*   Cardiac Panel (last 3 results)  Recent Labs  05/27/15 0303 05/27/15 1354 05/27/15 1914  TROPONINI 0.78* 0.60* 0.53*    Telemetry: Currently sinus rhythm  Assessment/Plan:  1.  Chest pain consistent with unstable angina-non-STEMI 2.  Coronary artery disease and moderate left main and three-vessel 3.  Chronic systolic heart failure 4.  History of poor medical compliance  Recommendations:  Has decided to stay in the hospital now.  Plan cardiac catheterization tomorrow.Cardiac catheterization was discussed with the patient fully  including risks of myocardial infarction, death, stroke, bleeding, arrhythmia, dye allergy, renal insufficiency or bleeding.  Possibility of intervention also discussed with patient.  The patient understands and is willing to proceed.   Kerry Hough  MD [facility]  05/28/2015, 9:13 AM

## 2015-05-28 NOTE — Progress Notes (Signed)
  Echocardiogram 2D Echocardiogram has been performed.  Darlina Sicilian M 05/28/2015, 10:40 AM

## 2015-05-29 ENCOUNTER — Encounter (HOSPITAL_COMMUNITY): Payer: Self-pay | Admitting: Interventional Cardiology

## 2015-05-29 ENCOUNTER — Encounter (HOSPITAL_COMMUNITY)
Admission: EM | Discharge status: Against Medical Advice | Payer: Medicaid Other | Source: Home / Self Care | Attending: Internal Medicine

## 2015-05-29 ENCOUNTER — Other Ambulatory Visit: Payer: Self-pay | Admitting: *Deleted

## 2015-05-29 DIAGNOSIS — I255 Ischemic cardiomyopathy: Secondary | ICD-10-CM

## 2015-05-29 DIAGNOSIS — Z720 Tobacco use: Secondary | ICD-10-CM

## 2015-05-29 DIAGNOSIS — I25110 Atherosclerotic heart disease of native coronary artery with unstable angina pectoris: Secondary | ICD-10-CM

## 2015-05-29 DIAGNOSIS — I2510 Atherosclerotic heart disease of native coronary artery without angina pectoris: Secondary | ICD-10-CM

## 2015-05-29 HISTORY — PX: CARDIAC CATHETERIZATION: SHX172

## 2015-05-29 LAB — CBC
HCT: 35.9 % — ABNORMAL LOW (ref 39.0–52.0)
HEMOGLOBIN: 11.8 g/dL — AB (ref 13.0–17.0)
MCH: 29.4 pg (ref 26.0–34.0)
MCHC: 32.9 g/dL (ref 30.0–36.0)
MCV: 89.3 fL (ref 78.0–100.0)
Platelets: 341 10*3/uL (ref 150–400)
RBC: 4.02 MIL/uL — AB (ref 4.22–5.81)
RDW: 15.7 % — AB (ref 11.5–15.5)
WBC: 7 10*3/uL (ref 4.0–10.5)

## 2015-05-29 LAB — HEPARIN LEVEL (UNFRACTIONATED): Heparin Unfractionated: 0.37 IU/mL (ref 0.30–0.70)

## 2015-05-29 LAB — BASIC METABOLIC PANEL
Anion gap: 9 (ref 5–15)
BUN: 11 mg/dL (ref 6–20)
CALCIUM: 8.9 mg/dL (ref 8.9–10.3)
CO2: 24 mmol/L (ref 22–32)
CREATININE: 1.04 mg/dL (ref 0.61–1.24)
Chloride: 102 mmol/L (ref 101–111)
GFR calc Af Amer: >60 mL/min (ref >60)
GFR calc non Af Amer: >60 mL/min (ref >60)
Glucose, Bld: 87 mg/dL (ref 65–99)
Potassium: 4.2 mmol/L (ref 3.5–5.1)
Sodium: 135 mmol/L (ref 135–145)

## 2015-05-29 SURGERY — LEFT HEART CATH AND CORONARY ANGIOGRAPHY

## 2015-05-29 MED ORDER — SODIUM CHLORIDE 0.9 % IJ SOLN
INTRAMUSCULAR | Status: DC | PRN
  Administered 2015-05-29: 11:00:00 via INTRA_ARTERIAL

## 2015-05-29 MED ORDER — IOHEXOL 350 MG/ML SOLN
INTRAVENOUS | Status: DC | PRN
  Administered 2015-05-29: 120 mL via INTRA_ARTERIAL

## 2015-05-29 MED ORDER — NICOTINE 14 MG/24HR TD PT24: 14.0000 mg | MEDICATED_PATCH | Freq: Every day | TRANSDERMAL | Status: DC

## 2015-05-29 MED ORDER — SODIUM CHLORIDE 0.9 % WEIGHT BASED INFUSION
3.0000 mL/kg/h | INTRAVENOUS | Status: AC
  Administered 2015-05-29 (×2): 3 mL/kg/h via INTRAVENOUS

## 2015-05-29 MED ORDER — ACETAMINOPHEN 325 MG PO TABS: 650.0000 mg | ORAL_TABLET | ORAL | Status: DC | PRN

## 2015-05-29 MED ORDER — MIDAZOLAM HCL 2 MG/2ML IJ SOLN
INTRAMUSCULAR | Status: AC
  Filled 2015-05-29: qty 4

## 2015-05-29 MED ORDER — NITROGLYCERIN 1 MG/10 ML FOR IR/CATH LAB
INTRA_ARTERIAL | Status: AC
Start: 2015-05-29 — End: 2015-05-29
  Filled 2015-05-29: qty 10

## 2015-05-29 MED ORDER — SODIUM CHLORIDE 0.9 % IJ SOLN: 3.0000 mL | INTRAMUSCULAR | Status: DC | PRN

## 2015-05-29 MED ORDER — SODIUM CHLORIDE 0.9 % IV SOLN: 250.0000 mL | INTRAVENOUS | Status: DC | PRN

## 2015-05-29 MED ORDER — MIDAZOLAM HCL 2 MG/2ML IJ SOLN
INTRAMUSCULAR | Status: DC | PRN
  Administered 2015-05-29: 1 mg via INTRAVENOUS

## 2015-05-29 MED ORDER — HEPARIN (PORCINE) IN NACL 2-0.9 UNIT/ML-% IJ SOLN
INTRAMUSCULAR | Status: AC
  Filled 2015-05-29: qty 1500

## 2015-05-29 MED ORDER — HEPARIN (PORCINE) IN NACL 100-0.45 UNIT/ML-% IJ SOLN
1250.0000 [IU]/h | INTRAMUSCULAR | Status: DC
  Administered 2015-05-29: 1250 [IU]/h via INTRAVENOUS
  Filled 2015-05-29: qty 250

## 2015-05-29 MED ORDER — CLOPIDOGREL BISULFATE 75 MG PO TABS: 75.0000 mg | ORAL_TABLET | Freq: Every day | ORAL | Status: DC

## 2015-05-29 MED ORDER — LIDOCAINE HCL (PF) 1 % IJ SOLN
INTRAMUSCULAR | Status: AC
  Filled 2015-05-29: qty 30

## 2015-05-29 MED ORDER — FENTANYL CITRATE (PF) 100 MCG/2ML IJ SOLN
INTRAMUSCULAR | Status: AC
  Filled 2015-05-29: qty 4

## 2015-05-29 MED ORDER — NITROGLYCERIN 1 MG/10 ML FOR IR/CATH LAB
INTRA_ARTERIAL | Status: DC | PRN
  Administered 2015-05-29: 200 ug via INTRACORONARY

## 2015-05-29 MED ORDER — ASPIRIN 81 MG PO TBEC: 81.0000 mg | DELAYED_RELEASE_TABLET | Freq: Every day | ORAL | Status: DC

## 2015-05-29 MED ORDER — VERAPAMIL HCL 2.5 MG/ML IV SOLN
INTRAVENOUS | Status: AC
  Filled 2015-05-29: qty 2

## 2015-05-29 MED ORDER — OXYCODONE-ACETAMINOPHEN 5-325 MG PO TABS
1.0000 | ORAL_TABLET | ORAL | Status: DC | PRN
  Administered 2015-05-29: 1 via ORAL
  Filled 2015-05-29 (×2): qty 1

## 2015-05-29 MED ORDER — HEPARIN SODIUM (PORCINE) 1000 UNIT/ML IJ SOLN
INTRAMUSCULAR | Status: AC
  Filled 2015-05-29: qty 1

## 2015-05-29 MED ORDER — FENTANYL CITRATE (PF) 100 MCG/2ML IJ SOLN
INTRAMUSCULAR | Status: DC | PRN
  Administered 2015-05-29: 50 ug via INTRAVENOUS

## 2015-05-29 MED ORDER — ASPIRIN 81 MG PO CHEW: 81.0000 mg | CHEWABLE_TABLET | Freq: Every day | ORAL | Status: DC

## 2015-05-29 MED ORDER — SODIUM CHLORIDE 0.9 % IJ SOLN
3.0000 mL | Freq: Two times a day (BID) | INTRAMUSCULAR | Status: DC
  Administered 2015-05-29: 3 mL via INTRAVENOUS

## 2015-05-29 MED ORDER — HEPARIN SODIUM (PORCINE) 1000 UNIT/ML IJ SOLN
INTRAMUSCULAR | Status: DC | PRN
  Administered 2015-05-29: 4000 [IU] via INTRAVENOUS

## 2015-05-29 SURGICAL SUPPLY — 12 items
CATH INFINITI 5 FR JL3.5 (CATHETERS) ×3 IMPLANT
CATH INFINITI JR4 5F (CATHETERS) ×3 IMPLANT
CATH SITESEER 5F MULTI A 2 (CATHETERS) IMPLANT
DEVICE RAD COMP TR BAND LRG (VASCULAR PRODUCTS) ×3 IMPLANT
GLIDESHEATH SLEND A-KIT 6F 22G (SHEATH) ×3 IMPLANT
KIT HEART LEFT (KITS) ×3 IMPLANT
PACK CARDIAC CATHETERIZATION (CUSTOM PROCEDURE TRAY) ×3 IMPLANT
SHEATH PINNACLE 5F 10CM (SHEATH) IMPLANT
TRANSDUCER W/STOPCOCK (MISCELLANEOUS) ×3 IMPLANT
TUBING CIL FLEX 10 FLL-RA (TUBING) ×3 IMPLANT
WIRE EMERALD 3MM-J .035X150CM (WIRE) IMPLANT
WIRE SAFE-T 1.5MM-J .035X260CM (WIRE) ×3 IMPLANT

## 2015-05-29 NOTE — Progress Notes (Signed)
Patient: [name] / Admit Date: 05/26/2015 / Date of Encounter: 05/29/2015, 7:53 AM   Subjective: Had a little more CP overnight but none currently.  Wants coffee. Upset about having to wait. Wants to know where his cardiologist is.   Objective: Telemetry: NSR Physical Exam: Blood pressure 107/72, pulse 63, temperature 98.2 F (36.8 C), temperature source Oral, resp. rate 13, height 6\' 2"  (1.88 m), weight 189 lb 14.4 oz (86.138 kg), SpO2 98 %. General: Well developed, well nourished AAM in no acute distress. Head: Normocephalic, atraumatic, sclera non-icteric, no xanthomas, nares are without discharge. Neck: JVP not elevated. Lungs: Clear bilaterally to auscultation without wheezes, rales, or rhonchi. Breathing is unlabored. Heart: RRR S1 S2 without murmurs, rubs, or gallops.  Abdomen: Soft, non-tender, non-distended with normoactive bowel sounds. No rebound/guarding. Extremities: No clubbing or cyanosis. No edema. Distal pedal pulses are 2+ and equal bilaterally. Neuro: Alert and oriented X 3. Moves all extremities spontaneously. Psych:  Responds to questions appropriately with a normal affect.   Intake/Output Summary (Last 24 hours) at 05/29/15 0753 Last data filed at 05/29/15 0500  Gross per 24 hour  Intake   1060 ml  Output   3600 ml  Net  -2540 ml    Inpatient Medications:  . aspirin EC  81 mg Oral Daily  . atorvastatin  80 mg Oral q1800  . carvedilol  6.25 mg Oral BID WC  . clopidogrel  75 mg Oral Daily  . levETIRAcetam  1,000 mg Oral Daily  . nicotine  14 mg Transdermal Daily  . nitroGLYCERIN  0.4 mg Sublingual Once  . PHENobarbital  32.4 mg Oral QID  . sodium chloride  3 mL Intravenous Q12H   Infusions:  . sodium chloride 1 mL/kg/hr (05/29/15 0656)  . heparin 1,250 Units/hr (05/28/15 1610)  . nitroGLYCERIN 5 mcg/min (05/28/15 1937)    Labs:  Recent Labs  05/28/15 0406 05/29/15 0426  NA 135 135  K 4.0 4.2  CL 101 102  CO2 26 24  GLUCOSE 89 87  BUN  12 11  CREATININE 1.02 1.04  CALCIUM 8.9 8.9    Recent Labs  05/27/15 0303 05/28/15 0406  AST 19 17  ALT 21 18  ALKPHOS 58 61  BILITOT 0.5 0.5  PROT 7.4 7.2  ALBUMIN 3.8 3.7    Recent Labs  05/28/15 0406 05/29/15 0426  WBC 6.6 7.0  HGB 11.4* 11.8*  HCT 35.0* 35.9*  MCV 87.7 89.3  PLT 347 341    Recent Labs  05/27/15 0303 05/27/15 1354 05/27/15 1914  TROPONINI 0.78* 0.60* 0.53*    Radiology/Studies:  Dg Chest 2 View  05/26/2015   CLINICAL DATA:  Chest pain and shortness of Breath for 2 days.  EXAM: CHEST  2 VIEW  COMPARISON:  12/09/2013  FINDINGS: The cardiac silhouette, mediastinal and hilar contours are within normal limits and stable. Coronary artery stents are noted. There is tortuosity of the thoracic aorta. There are mild emphysematous changes and chronic bronchitic type lung changes but no infiltrates, edema or effusions. The bony thorax is intact.  IMPRESSION: Chronic emphysematous and bronchitic type lung changes but no definite acute overlying pulmonary process.   Electronically Signed   By: Marijo Sanes M.D.   On: 05/26/2015 18:36     Assessment and Plan   1. NSTEMI 2. CAD (s/p prior BMS to LCx 2009, PTCA to LCx 2012 for ISR after running out of Plavix, last cath 2014 with mild-moderate stenosis of distal LM, ostial Cx, RI,  mid LAD) 3. Chronic systolic CHF EF 47-09% 4. H/o poor medical compliance 5. Valvular heart disease with mild AI, mod MR 6. HTN, controlled  Plan cath today as previously arranged. Continue ASA, Plavix, BB, statin, heparin. I spoke with the cath lab who requested he remain NPO for now while we sort out schedule given heavy load of patients today and possible rearranging. Of note patient reports again running out of Plavix several days prior to admission. Importance of compliance reinforced including risk of death/MI if he fails to comply. He verbalized understanding.  Signed, Melina Copa PA-C Pager: [phone]   Patient seen and  examined. Agree with assessment and plan. Pt has continues to experience sharp intermittent chest pain.  Enzymes positive for NSTEMI; T wave inversion inferiorly on ECG.  Discussed the importance of medicine compliance.  Plan for cath today. The risks and benefits of a cardiac catheterization including, but not limited to, death, stroke, MI, kidney damage and bleeding were discussed with the patient who indicates understanding and agrees to proceed.    Troy Sine, MD, [facility] 05/29/2015 9:06 AM
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
## 2015-05-29 NOTE — Discharge Summary (Addendum)
Physician Discharge Summary  [name] MRN: [id number] DOB/AGE: [id number] y.o.  PCP: Jenell Milliner, MD   Admit date: 05/26/2015 Discharge date: 05/29/2015  Discharge Diagnoses:     Principal Problem:   NSTEMI (non-ST elevated myocardial infarction) Active Problems:   TOBACCO ABUSE   Ischemic cardiomyopathy   Seizure disorder  medical noncompliance  Addendum  Moderate distal left main (50-60% depending upon views), moderate mid LAD, moderately severe first diagonal, severe obstruction of the ramus intermedius/first obtuse marginal, and total occlusion of the RCA. The previously placed stent in the proximal to mid circumflex is totally occluded. The distal obtuse marginals fill by left to right and right to right collaterals.  Moderate inferior wall hypokinesis. EF 50%   Recommendations:   Hold Plavix  Cardiology recommends TCTS consultation for consideration of bypass surgery including SVG to RCA, SVG to distal circumflex, SVG to ramus intermedius, SVG to diagonal, and LIMA to LAD. Should it be determined of bypass surgery is not indicated or not possible, consider stenting of the ramus intermedius with continued medical therapy. Cardiology recommends bypass surgery as it would be   more durable treatment option given his social issues and comorbidities.       Medication List    STOP taking these medications        aspirin 325 MG tablet  Replaced by:  aspirin 81 MG EC tablet     lisinopril 10 MG tablet  Commonly known as:  PRINIVIL,ZESTRIL      TAKE these medications        acetaminophen 500 MG tablet  Commonly known as:  TYLENOL  Take 1,000 mg by mouth every 6 (six) hours as needed for mild pain, moderate pain or headache.     aspirin 81 MG EC tablet  Take 1 tablet (81 mg total) by mouth daily.     atorvastatin 20 MG tablet  Commonly known as:  LIPITOR  Take 20 mg by mouth daily.     carvedilol 6.25 MG tablet  Commonly known as:  COREG  Take 6.25  mg by mouth 2 (two) times daily with a meal.     clopidogrel 75 MG tablet  Commonly known as:  PLAVIX  Take 1 tablet (75 mg total) by mouth daily.     levETIRAcetam 500 MG 24 hr tablet  Commonly known as:  KEPPRA XR  Take 1,000 mg by mouth daily.     nicotine 14 mg/24hr patch  Commonly known as:  NICODERM CQ - dosed in mg/24 hours  Place 1 patch (14 mg total) onto the skin daily.     nitroGLYCERIN 0.4 MG SL tablet  Commonly known as:  NITROSTAT  Place 0.4 mg under the tongue every 5 (five) minutes as needed for chest pain.     PHENobarbital 32.4 MG tablet  Commonly known as:  LUMINAL  Take 32.4 mg by mouth 4 (four) times daily.     POTASSIUM PO  Take 1 tablet by mouth daily at 12 noon.         Discharge Condition: Stable  Disposition: 01-Home or Self Care   Consults: Cardiology    Significant Diagnostic Studies:  Dg Chest 2 View  05/26/2015   CLINICAL DATA:  Chest pain and shortness of Breath for 2 days.  EXAM: CHEST  2 VIEW  COMPARISON:  12/09/2013  FINDINGS: The cardiac silhouette, mediastinal and hilar contours are within normal limits and stable. Coronary artery stents are noted. There is tortuosity of the thoracic aorta.  There are mild emphysematous changes and chronic bronchitic type lung changes but no infiltrates, edema or effusions. The bony thorax is intact.  IMPRESSION: Chronic emphysematous and bronchitic type lung changes but no definite acute overlying pulmonary process.   Electronically Signed   By: Marijo Sanes M.D.   On: 05/26/2015 18:36    echocardiogram  LV EF: 40% -  45%  ------------------------------------------------------------------- Indications:   Chest pain 786.51.  ------------------------------------------------------------------- History:  PMH:  Coronary artery disease. PMH:  Myocardial infarction. Risk factors: Current tobacco use.  Hypertension. Dyslipidemia.  ------------------------------------------------------------------- Study Conclusions  - Left ventricle: The cavity size was moderately dilated. Systolic function was mildly to moderately reduced. The estimated ejection fraction was in the range of 40% to 45%. There is akinesis of the entireinferolateral myocardium. Features are consistent with a pseudonormal left ventricular filling pattern, with concomitant abnormal relaxation and increased filling pressure (grade 2 diastolic dysfunction). - Aortic valve: There was mild regurgitation. - Mitral valve: There was moderate regurgitation.   CARDIAC CATH and OTHER PROCEDURES-results pending at this time   [facility] Weights   05/27/15 0134 05/28/15 0421 05/29/15 0500  Weight: 86.365 kg (190 lb 6.4 oz) 86.138 kg (189 lb 14.4 oz) 86.138 kg (189 lb 14.4 oz)     Microbiology: No results found for this or any previous visit (from the past 240 hour(s)).     Blood Culture    Component Value Date/Time   SDES URINE, RANDOM 02/04/2009 0636   SPECREQUEST NONE 02/04/2009 0636   CULT NO GROWTH 02/04/2009 0636   REPTSTATUS 02/05/2009 FINAL 02/04/2009 0636      Labs: Results for orders placed or performed during the hospital encounter of 05/26/15 (from the past 48 hour(s))  Troponin I     Status: Abnormal   Collection Time: 05/27/15  1:54 PM  Result Value Ref Range   Troponin I 0.60 (HH) <0.031 ng/mL    Comment:        POSSIBLE MYOCARDIAL ISCHEMIA. SERIAL TESTING RECOMMENDED. REPEATED TO VERIFY CRITICAL VALUE NOTED.  VALUE IS CONSISTENT WITH PREVIOUSLY REPORTED AND CALLED VALUE.   Troponin I     Status: Abnormal   Collection Time: 05/27/15  7:14 PM  Result Value Ref Range   Troponin I 0.53 (HH) <0.031 ng/mL    Comment:        POSSIBLE MYOCARDIAL ISCHEMIA. SERIAL TESTING RECOMMENDED. REPEATED TO VERIFY CRITICAL VALUE NOTED.  VALUE IS CONSISTENT WITH PREVIOUSLY REPORTED AND CALLED VALUE.    Heparin level (unfractionated)     Status: None   Collection Time: 05/27/15  7:14 PM  Result Value Ref Range   Heparin Unfractionated 0.36 0.30 - 0.70 IU/mL    Comment:        IF HEPARIN RESULTS ARE BELOW EXPECTED VALUES, AND PATIENT DOSAGE HAS BEEN CONFIRMED, SUGGEST FOLLOW UP TESTING OF ANTITHROMBIN III LEVELS.   CBC     Status: Abnormal   Collection Time: 05/28/15  4:06 AM  Result Value Ref Range   WBC 6.6 4.0 - 10.5 K/uL   RBC 3.99 (L) 4.22 - 5.81 MIL/uL   Hemoglobin 11.4 (L) 13.0 - 17.0 g/dL   HCT 35.0 (L) 39.0 - 52.0 %   MCV 87.7 78.0 - 100.0 fL   MCH 28.6 26.0 - 34.0 pg   MCHC 32.6 30.0 - 36.0 g/dL   RDW 15.8 (H) 11.5 - 15.5 %   Platelets 347 150 - 400 K/uL  Heparin level (unfractionated)     Status: None   Collection Time: 05/28/15  4:06 AM  Result Value Ref Range   Heparin Unfractionated 0.41 0.30 - 0.70 IU/mL    Comment:        IF HEPARIN RESULTS ARE BELOW EXPECTED VALUES, AND PATIENT DOSAGE HAS BEEN CONFIRMED, SUGGEST FOLLOW UP TESTING OF ANTITHROMBIN III LEVELS.   Comprehensive metabolic panel     Status: None   Collection Time: 05/28/15  4:06 AM  Result Value Ref Range   Sodium 135 135 - 145 mmol/L   Potassium 4.0 3.5 - 5.1 mmol/L   Chloride 101 101 - 111 mmol/L   CO2 26 22 - 32 mmol/L   Glucose, Bld 89 65 - 99 mg/dL   BUN 12 6 - 20 mg/dL   Creatinine, Ser 1.02 0.61 - 1.24 mg/dL   Calcium 8.9 8.9 - 10.3 mg/dL   Total Protein 7.2 6.5 - 8.1 g/dL   Albumin 3.7 3.5 - 5.0 g/dL   AST 17 15 - 41 U/L   ALT 18 17 - 63 U/L   Alkaline Phosphatase 61 38 - 126 U/L   Total Bilirubin 0.5 0.3 - 1.2 mg/dL   GFR calc non Af Amer >60 >60 mL/min   GFR calc Af Amer >60 >60 mL/min    Comment: (NOTE) The eGFR has been calculated using the CKD EPI equation. This calculation has not been validated in all clinical situations. eGFR's persistently <60 mL/min signify possible Chronic Kidney Disease.    Anion gap 8 5 - 15  CBC     Status: Abnormal   Collection Time:  05/29/15  4:26 AM  Result Value Ref Range   WBC 7.0 4.0 - 10.5 K/uL   RBC 4.02 (L) 4.22 - 5.81 MIL/uL   Hemoglobin 11.8 (L) 13.0 - 17.0 g/dL   HCT 35.9 (L) 39.0 - 52.0 %   MCV 89.3 78.0 - 100.0 fL   MCH 29.4 26.0 - 34.0 pg   MCHC 32.9 30.0 - 36.0 g/dL   RDW 15.7 (H) 11.5 - 15.5 %   Platelets 341 150 - 400 K/uL  Heparin level (unfractionated)     Status: None   Collection Time: 05/29/15  4:26 AM  Result Value Ref Range   Heparin Unfractionated 0.37 0.30 - 0.70 IU/mL    Comment:        IF HEPARIN RESULTS ARE BELOW EXPECTED VALUES, AND PATIENT DOSAGE HAS BEEN CONFIRMED, SUGGEST FOLLOW UP TESTING OF ANTITHROMBIN III LEVELS.   Basic metabolic panel     Status: None   Collection Time: 05/29/15  4:26 AM  Result Value Ref Range   Sodium 135 135 - 145 mmol/L   Potassium 4.2 3.5 - 5.1 mmol/L   Chloride 102 101 - 111 mmol/L   CO2 24 22 - 32 mmol/L   Glucose, Bld 87 65 - 99 mg/dL   BUN 11 6 - 20 mg/dL   Creatinine, Ser 1.04 0.61 - 1.24 mg/dL   Calcium 8.9 8.9 - 10.3 mg/dL   GFR calc non Af Amer >60 >60 mL/min   GFR calc Af Amer >60 >60 mL/min    Comment: (NOTE) The eGFR has been calculated using the CKD EPI equation. This calculation has not been validated in all clinical situations. eGFR's persistently <60 mL/min signify possible Chronic Kidney Disease.    Anion gap 9 5 - 15     Lipid Panel     Component Value Date/Time   CHOL 162 04/07/2011 0151   TRIG 62 04/07/2011 0151   HDL 36* 04/07/2011 0151   CHOLHDL 4.5  04/07/2011 0151   VLDL 12 04/07/2011 0151   LDLCALC 114* 04/07/2011 0151     No results found for: HGBA1C   Lab Results  Component Value Date   LDLCALC 114* 04/07/2011   CREATININE 1.04 05/29/2015     HPI :*55 AA man with known CAD, PCI to pLAD and mLCX, moderate LV systolic dysfunction with LVEF 40%, seizure disorder, poor medical compliance with limited resources (now on disability), last cath in 09/2013 by Dr. Burt Knack (noted below), presents to ER  today with left sided chest discomfort of moderate intensity, radiating to left arm associated with belching and mild diaphoresis. He has been having episodes of these symptoms for the past couple days. No syncope, fever, N/V, orthopnea, PND, pedal edema. He says he drove himself to ER today. In ER Tni 0.16 and 0.58. He has been started on heparin and NTG infusions. He reports significant improvement in his symptoms. BP is normal. He reports compliance with his medications although it cannot be confirmed.     HOSPITAL COURSE:    Coronary artery disease (s/p prior BMS to LCx 2009, PTCA to LCx 2012 for ISR after running out of Plavix, last cath 2014 with mild-moderate stenosis of distal LM, ostial Cx, RI, mid LAD)  Non-ST elevation MI, unstable angina-initially placed on nitroglycerin, and  IV heparin, cardiac cath in the today, results pending, troponin trending down, chest pain-free, continue Plavix, Coreg,  2-D echo with results as above  Chronic systolic heart failure without exacerbation  Dyslipidemia-continue Lipitor  Seizure disorder-continue Keppra, phenobarbital  Hypertension-continue Coreg, holding lisinopril because of soft blood pressure  Nicotine dependence   Discharge Exam:    Blood pressure 107/72, pulse 63, temperature 98.2 F (36.8 C), temperature source Oral, resp. rate 13, height $RemoveBe'6\' 2"'[id number]$  (1.88 m), weight 86.138 kg (189 lb 14.4 oz), SpO2 98 %.   General: No acute respiratory distress Lungs: Clear to auscultation bilaterally without wheezes or crackles Cardiovascular: Regular rate and rhythm without murmur gallop or rub normal S1 and S2 Abdomen: Nontender, nondistended, soft, bowel sounds positive, no rebound, no ascites, no appreciable mass Extremities: No significant cyanosis, clubbing, or edema bilateral lower extremities      Discharge Instructions    Diet - low sodium heart healthy    Complete by:  As directed      Increase activity slowly    Complete by:  As  directed            Follow-up Information    Follow up with Jenell Milliner, MD. Schedule an appointment as soon as possible for a visit in 1 week.   Specialty:  Cardiology   Contact information:   [address] [place] [address] [phone]       Signed: Reyne Dumas 05/29/2015, 11:02 AM        Time spent >45 mins

## 2015-05-29 NOTE — Progress Notes (Signed)
ANTICOAGULATION CONSULT NOTE - Follow Up Consult  Pharmacy Consult for Heparin Indication: multi vessel disease  Allergies  Allergen Reactions  . Food Allergy Formula Other (See Comments)    Sage Causes seizures:    Patient Measurements: Height: 6\' 2"  (188 cm) Weight: 189 lb 14.4 oz (86.138 kg) IBW/kg (Calculated) : 82.2 Heparin Dosing Weight: 86.4 kg  Vital Signs: Temp: 98.2 F (36.8 C) (08/08 0500) BP: 128/79 mmHg (08/08 1258) Pulse Rate: 79 (08/08 1258)  Labs:  Recent Labs  05/27/15 0303  05/27/15 1354 05/27/15 1914 05/28/15 0406 05/29/15 0426  HGB 12.1*  --   --   --  11.4* 11.8*  HCT 36.6*  --   --   --  35.0* 35.9*  PLT 355  --   --   --  347 341  APTT 65*  --   --   --   --   --   LABPROT 13.5  --   --   --   --   --   INR 1.01  --   --   --   --   --   HEPARINUNFRC 0.33  < >  --  0.36 0.41 0.37  CREATININE 0.93  --   --   --  1.02 1.04  TROPONINI 0.78*  --  0.60* 0.53*  --   --   < > = values in this interval not displayed.  Estimated Creatinine Clearance: 95.5 mL/min (by C-G formula based on Cr of 1.04).   Medications:  Scheduled:  . aspirin EC  81 mg Oral Daily  . atorvastatin  80 mg Oral q1800  . carvedilol  6.25 mg Oral BID WC  . levETIRAcetam  1,000 mg Oral Daily  . nicotine  14 mg Transdermal Daily  . nitroGLYCERIN  0.4 mg Sublingual Once  . PHENobarbital  32.4 mg Oral QID  . sodium chloride  3 mL Intravenous Q12H   Infusions:  . sodium chloride 3 mL/kg/hr (05/29/15 1336)  . nitroGLYCERIN 5 mcg/min (05/28/15 1937)    Assessment: 54 yo Myers  S/p cath today with multi vessel disease with plans for TCTS evaluation.  Pt was on heparin pre-cath at 1250 units/hr with therapeutic levels.  Orders to restart heparin 8 hours after TR band removal.  Spoke with RN, no bleeding noted.  Anticipate TR band removal ~ 1515 today.  Will start heparin at 2300 tonight.  Goal of Therapy:  Heparin level 0.3-0.7 units/ml Monitor platelets by anticoagulation  protocol: Yes   Plan:  Heparin to start at 2300 tonight at 1250 units/hr Heparin level 8/9 at 0700 (8 hours after restart) Heparin level and CBC daily while on heparin. Follow up plans for CABG.  [employer], Pharm.D., BCPS Clinical Pharmacist Pager [phone] 05/29/2015 1:51 PM

## 2015-05-30 DIAGNOSIS — I25119 Atherosclerotic heart disease of native coronary artery with unspecified angina pectoris: Secondary | ICD-10-CM | POA: Insufficient documentation

## 2015-05-30 DIAGNOSIS — I2510 Atherosclerotic heart disease of native coronary artery without angina pectoris: Secondary | ICD-10-CM | POA: Insufficient documentation

## 2015-05-30 DIAGNOSIS — I25110 Atherosclerotic heart disease of native coronary artery with unstable angina pectoris: Secondary | ICD-10-CM

## 2015-05-30 DIAGNOSIS — G40909 Epilepsy, unspecified, not intractable, without status epilepticus: Secondary | ICD-10-CM

## 2015-05-30 LAB — CBC
HCT: 37.5 % — ABNORMAL LOW (ref 39.0–52.0)
Hemoglobin: 12.6 g/dL — ABNORMAL LOW (ref 13.0–17.0)
MCH: 29.4 pg (ref 26.0–34.0)
MCHC: 33.6 g/dL (ref 30.0–36.0)
MCV: 87.6 fL (ref 78.0–100.0)
Platelets: 368 10*3/uL (ref 150–400)
RBC: 4.28 MIL/uL (ref 4.22–5.81)
RDW: 15.5 % (ref 11.5–15.5)
WBC: 6.4 10*3/uL (ref 4.0–10.5)

## 2015-05-30 LAB — BASIC METABOLIC PANEL
Anion gap: 8 (ref 5–15)
BUN: 8 mg/dL (ref 6–20)
CO2: 25 mmol/L (ref 22–32)
Calcium: 9.1 mg/dL (ref 8.9–10.3)
Chloride: 103 mmol/L (ref 101–111)
Creatinine, Ser: 0.92 mg/dL (ref 0.61–1.24)
GLUCOSE: 92 mg/dL (ref 65–99)
POTASSIUM: 4.1 mmol/L (ref 3.5–5.1)
Sodium: 136 mmol/L (ref 135–145)

## 2015-05-30 LAB — PLATELET INHIBITION P2Y12: Platelet Function  P2Y12: 168 [PRU] — ABNORMAL LOW (ref 194–418)

## 2015-05-30 LAB — HEPARIN LEVEL (UNFRACTIONATED): Heparin Unfractionated: 0.43 IU/mL (ref 0.30–0.70)

## 2015-05-30 MED ORDER — ISOSORBIDE MONONITRATE ER 30 MG PO TB24
30.0000 mg | ORAL_TABLET | Freq: Every day | ORAL | Status: DC
  Administered 2015-05-30: 30 mg via ORAL
  Filled 2015-05-30: qty 1

## 2015-05-30 MED ORDER — NITROGLYCERIN 0.4 MG SL SUBL: 0.4000 mg | SUBLINGUAL_TABLET | SUBLINGUAL | Status: DC | PRN

## 2015-05-30 MED ORDER — ISOSORBIDE MONONITRATE ER 30 MG PO TB24: 30.0000 mg | ORAL_TABLET | Freq: Every day | ORAL | Status: DC

## 2015-05-30 MED FILL — Lidocaine HCl Local Preservative Free (PF) Inj 1%: INTRAMUSCULAR | Qty: 30 | Status: AC

## 2015-05-30 NOTE — Progress Notes (Signed)
Pt declined ambulation but listened to pre-op education. Pt is now interested in OHS but would like to go home first and secure a place to live for after surgery. Discussed 24/7 care for first week after d/c and the option of SNF. Discussed sternal precautions, IS (pt able to inspire 2500 ml with appropriate mechanics), and mobility. Pt attentive and voiced his concerns. Gave pt OHS booklet, careplan, and video to watch. All questions answered.  [id number] [place], ACSM 10:38 AM 05/30/2015

## 2015-05-30 NOTE — Progress Notes (Signed)
Patient Name: [name] Date of Encounter: 05/30/2015  Primary Cardiologist: Dr. Sherren Mocha - last visit 03/16/14  Principal Problem:   NSTEMI (non-ST elevated myocardial infarction) Active Problems:   TOBACCO ABUSE   Ischemic cardiomyopathy   Seizure disorder    SUBJECTIVE  Denies any CP or SOB. Last episode of episode of chest discomfort yesterday  CURRENT MEDS . aspirin EC  81 mg Oral Daily  . atorvastatin  80 mg Oral q1800  . carvedilol  6.25 mg Oral BID WC  . levETIRAcetam  1,000 mg Oral Daily  . nicotine  14 mg Transdermal Daily  . nitroGLYCERIN  0.4 mg Sublingual Once  . PHENobarbital  32.4 mg Oral QID  . sodium chloride  3 mL Intravenous Q12H    OBJECTIVE  Filed Vitals:   05/29/15 2000 05/29/15 2353 05/30/15 0400 05/30/15 0748  BP: 108/64 104/66 124/79 105/66  Pulse: 71 58 57 59  Temp: 98.7 F (37.1 C) 98.4 F (36.9 C) 97.4 F (36.3 C) 98.9 F (37.2 C)  TempSrc:    Oral  Resp: 18 19 16 18   Height:      Weight:   188 lb 1.6 oz (85.322 kg)   SpO2: 99% 100% 100% 100%    Intake/Output Summary (Last 24 hours) at 05/30/15 1318 Last data filed at 05/30/15 0500  Gross per 24 hour  Intake    360 ml  Output   2450 ml  Net  -2090 ml   Filed Weights   05/28/15 0421 05/29/15 0500 05/30/15 0400  Weight: 189 lb 14.4 oz (86.138 kg) 189 lb 14.4 oz (86.138 kg) 188 lb 1.6 oz (85.322 kg)    PHYSICAL EXAM  General: Pleasant, NAD. Neuro: Alert and oriented X 3. Moves all extremities spontaneously. Psych: Normal affect. HEENT:  Normal  Neck: Supple without bruits or JVD. Lungs:  Resp regular and unlabored, CTA. Heart: RRR no s3, s4, or murmurs. Abdomen: Soft, non-tender, non-distended, BS + x 4.  Extremities: No clubbing, cyanosis or edema. DP/PT/Radials 2+ and equal bilaterally.  Accessory Clinical Findings  CBC  Recent Labs  05/29/15 0426 05/30/15 0756  WBC 7.0 6.4  HGB 11.8* 12.6*  HCT 35.9* 37.5*  MCV 89.3 87.6  PLT 341 956   Basic  Metabolic Panel  Recent Labs  05/29/15 0426 05/30/15 0756  NA 135 136  K 4.2 4.1  CL 102 103  CO2 24 25  GLUCOSE 87 92  BUN 11 8  CREATININE 1.04 0.92  CALCIUM 8.9 9.1   Liver Function Tests  Recent Labs  05/28/15 0406  AST 17  ALT 18  ALKPHOS 61  BILITOT 0.5  PROT 7.2  ALBUMIN 3.7   Cardiac Enzymes  Recent Labs  05/27/15 1354 05/27/15 1914  TROPONINI 0.60* 0.53*    TELE NSR with HR 60s    ECG  No new EKG  Echocardiogram 05/28/2015  LV EF: 40% -  45%  ------------------------------------------------------------------- Indications:   Chest pain 786.51.  ------------------------------------------------------------------- History:  PMH:  Coronary artery disease. PMH:  Myocardial infarction. Risk factors: Current tobacco use. Hypertension. Dyslipidemia.  ------------------------------------------------------------------- Study Conclusions  - Left ventricle: The cavity size was moderately dilated. Systolic function was mildly to moderately reduced. The estimated ejection fraction was in the range of 40% to 45%. There is akinesis of the entireinferolateral myocardium. Features are consistent with a pseudonormal left ventricular filling pattern, with concomitant abnormal relaxation and increased filling pressure (grade 2 diastolic dysfunction). - Aortic valve: There was mild regurgitation. -  Mitral valve: There was moderate regurgitation.    Radiology/Studies  Dg Chest 2 View  05/26/2015   CLINICAL DATA:  Chest pain and shortness of Breath for 2 days.  EXAM: CHEST  2 VIEW  COMPARISON:  12/09/2013  FINDINGS: The cardiac silhouette, mediastinal and hilar contours are within normal limits and stable. Coronary artery stents are noted. There is tortuosity of the thoracic aorta. There are mild emphysematous changes and chronic bronchitic type lung changes but no infiltrates, edema or effusions. The bony thorax is intact.  IMPRESSION:  Chronic emphysematous and bronchitic type lung changes but no definite acute overlying pulmonary process.   Electronically Signed   By: Marijo Sanes M.D.   On: 05/26/2015 18:36    ASSESSMENT AND PLAN  89 AA man with known CAD, PCI to pLAD and mLCX, moderate LV systolic dysfunction with LVEF 40%, seizure disorder, poor medical compliance with limited resources (now on disability), last cath in 09/2013 by Dr. Burt Knack (noted below), presents to ER on 8/5 with CP. Trop elevation, cath shows 3v dx. CT surgery consulted.   1. NSTEMI  - Echo 05/28/2015 EF 40-45%, akinesis of entire inferolateral myocardium, grade 2 diastolic dysfunction, moderate MR and mild AR   - Cath 05/29/2015 60% LLM, 60% mid LAD, 75% D1, 100% prox to mid LCx ISR, 100% prox to mid RCA. CT surgery consulted for consideration of SVG to RCA, SVG to distal LCx, SVG to RI, SVG to diagonal, and LIMA to LAD.   - Seen by CT surgery, consider for bypass. Continue to hold plavix. Continue ASA, lipitor, coreg. Continue on IV heparin and IV nitro. May consider change IV nitro to Imdur.   2. CAD  3. Poor medical compliance  Signed, Almyra Deforest PA-C Pager: [phone]   Patient seen and examined. Agree with assessment and plan. Long discussion with patient concerning my recommendation that he stay in hospital on iv heparin until CABG. He reviewed all the social reasons why he must leave to go to the hotel where his belongings are and set up a place to live post CABG> Reviewed risks/benefits in detail. At present will start imdur initially at 30 mg. P in 50's so will not increase coreg.  If he has to leave for a short term ?lovenox injection prior to departure. Off Plavix; ? CABG potential on Friday. If pt is to leave, would finalize surgical date with Dr. Servando Snare prior to his departure.  Troy Sine, MD, [facility] 05/30/2015 2:23 PM

## 2015-05-30 NOTE — Progress Notes (Signed)
ANTICOAGULATION CONSULT NOTE - Follow Up Consult  Pharmacy Consult for Heparin Indication: multi vessel disease  Allergies  Allergen Reactions  . Food Allergy Formula Other (See Comments)    Sage Causes seizures:    Patient Measurements: Height: 6\' 2"  (188 cm) Weight: 188 lb 1.6 oz (85.322 kg) IBW/kg (Calculated) : 82.2 Heparin Dosing Weight: 86.4 kg  Vital Signs: Temp: 98.9 F (37.2 C) (08/09 0748) Temp Source: Oral (08/09 0748) BP: 105/66 mmHg (08/09 0748) Pulse Rate: 59 (08/09 0748)  Labs:  Recent Labs  05/27/15 1354  05/27/15 1914  05/28/15 0406 05/29/15 0426 05/30/15 0756  HGB  --   --   --   < > 11.4* 11.8* 12.6*  HCT  --   --   --   --  35.0* 35.9* 37.5*  PLT  --   --   --   --  347 341 368  HEPARINUNFRC  --   < > 0.36  --  0.41 0.37 0.43  CREATININE  --   --   --   --  1.02 1.04 0.92  TROPONINI 0.60*  --  0.53*  --   --   --   --   < > = values in this interval not displayed.  Estimated Creatinine Clearance: 108 mL/min (by C-G formula based on Cr of 0.92).   Medications:  Scheduled:  . aspirin EC  81 mg Oral Daily  . atorvastatin  80 mg Oral q1800  . carvedilol  6.25 mg Oral BID WC  . levETIRAcetam  1,000 mg Oral Daily  . nicotine  14 mg Transdermal Daily  . nitroGLYCERIN  0.4 mg Sublingual Once  . PHENobarbital  32.4 mg Oral QID  . sodium chloride  3 mL Intravenous Q12H   Infusions:  . heparin 1,250 Units/hr (05/29/15 2229)  . nitroGLYCERIN 5 mcg/min (05/28/15 1937)    Assessment: 54 yo M  S/p cath 8/8 with multi vessel disease with recommendations for OHS.  Pt is therapeutic on Heparin at 1250 units/hr.    Goal of Therapy:  Heparin level 0.3-0.7 units/ml Monitor platelets by anticoagulation protocol: Yes   Plan:  Continue Heparin at 1250 units/hr Heparin level and CBC daily while on heparin. Follow up plans for OHS.  [employer], Pharm.D., BCPS Clinical Pharmacist Pager [phone] 05/30/2015 11:06 AM

## 2015-05-30 NOTE — Progress Notes (Signed)
UR Completed Daisy Mcneel Graves-Bigelow, RN,BSN [phone]  

## 2015-05-30 NOTE — Discharge Summary (Signed)
Physician progress note  [name] MRN: [id number] DOB/AGE: 53/25/1962 53 y.o.  PCP: Jenell Milliner, MD   Admit date: 05/26/2015 Discharge date: To be determined  Discharge Diagnoses:     Principal Problem:   NSTEMI (non-ST elevated myocardial infarction) Active Problems:   TOBACCO ABUSE   Ischemic cardiomyopathy   Seizure disorder   Coronary artery disease involving native coronary artery of native heart with unstable angina pectoris  medical noncompliance   Pt has decided to leave AMA (AMA paper signed and in chart). Cardiology Isaac Laud [place], [place]), TCTS (Dr. Servando Snare), notified. Rx for SL ntg and Imdur $RemoveB'30mg'SmnIuiEN$  given to patient.     Medication List    STOP taking these medications        aspirin 325 MG tablet  Replaced by:  aspirin 81 MG EC tablet     clopidogrel 75 MG tablet  Commonly known as:  PLAVIX     lisinopril 10 MG tablet  Commonly known as:  PRINIVIL,ZESTRIL      TAKE these medications        acetaminophen 500 MG tablet  Commonly known as:  TYLENOL  Take 1,000 mg by mouth every 6 (six) hours as needed for mild pain, moderate pain or headache.     aspirin 81 MG EC tablet  Take 1 tablet (81 mg total) by mouth daily.     atorvastatin 20 MG tablet  Commonly known as:  LIPITOR  Take 20 mg by mouth daily.     carvedilol 6.25 MG tablet  Commonly known as:  COREG  Take 6.25 mg by mouth 2 (two) times daily with a meal.     isosorbide mononitrate 30 MG 24 hr tablet  Commonly known as:  IMDUR  Take 1 tablet (30 mg total) by mouth daily.     levETIRAcetam 500 MG 24 hr tablet  Commonly known as:  KEPPRA XR  Take 1,000 mg by mouth daily.     nicotine 14 mg/24hr patch  Commonly known as:  NICODERM CQ - dosed in mg/24 hours  Place 1 patch (14 mg total) onto the skin daily.     nitroGLYCERIN 0.4 MG SL tablet  Commonly known as:  NITROSTAT  Place 1 tablet (0.4 mg total) under the tongue every 5 (five) minutes as needed for chest pain.     PHENobarbital 32.4  MG tablet  Commonly known as:  LUMINAL  Take 32.4 mg by mouth 4 (four) times daily.     POTASSIUM PO  Take 1 tablet by mouth daily at 12 noon.         Discharge Condition: Stable  Disposition: 01-Home or Self Care   Consults: Cardiology    Significant Diagnostic Studies:  Dg Chest 2 View  05/26/2015   CLINICAL DATA:  Chest pain and shortness of Breath for 2 days.  EXAM: CHEST  2 VIEW  COMPARISON:  12/09/2013  FINDINGS: The cardiac silhouette, mediastinal and hilar contours are within normal limits and stable. Coronary artery stents are noted. There is tortuosity of the thoracic aorta. There are mild emphysematous changes and chronic bronchitic type lung changes but no infiltrates, edema or effusions. The bony thorax is intact.  IMPRESSION: Chronic emphysematous and bronchitic type lung changes but no definite acute overlying pulmonary process.   Electronically Signed   By: Marijo Sanes M.D.   On: 05/26/2015 18:36    echocardiogram  LV EF: 40% -  45%  ------------------------------------------------------------------- Indications:   Chest pain 786.51.  ------------------------------------------------------------------- History:  PMH:  Coronary artery disease. PMH:  Myocardial infarction. Risk factors: Current tobacco use. Hypertension. Dyslipidemia.  ------------------------------------------------------------------- Study Conclusions  - Left ventricle: The cavity size was moderately dilated. Systolic function was mildly to moderately reduced. The estimated ejection fraction was in the range of 40% to 45%. There is akinesis of the entireinferolateral myocardium. Features are consistent with a pseudonormal left ventricular filling pattern, with concomitant abnormal relaxation and increased filling pressure (grade 2 diastolic dysfunction). - Aortic valve: There was mild regurgitation. - Mitral valve: There was moderate regurgitation.   CARDIAC  CATH and OTHER PROCEDURES-results pending at this time   Filed Weights   05/28/15 0421 05/29/15 0500 05/30/15 0400  Weight: 86.138 kg (189 lb 14.4 oz) 86.138 kg (189 lb 14.4 oz) 85.322 kg (188 lb 1.6 oz)     Microbiology: No results found for this or any previous visit (from the past 240 hour(s)).     Blood Culture    Component Value Date/Time   SDES URINE, RANDOM 02/04/2009 0636   SPECREQUEST NONE 02/04/2009 0636   CULT NO GROWTH 02/04/2009 0636   REPTSTATUS 02/05/2009 FINAL 02/04/2009 0636      Labs: Results for orders placed or performed during the hospital encounter of 05/26/15 (from the past 48 hour(s))  CBC     Status: Abnormal   Collection Time: 05/29/15  4:26 AM  Result Value Ref Range   WBC 7.0 4.0 - 10.5 K/uL   RBC 4.02 (L) 4.22 - 5.81 MIL/uL   Hemoglobin 11.8 (L) 13.0 - 17.0 g/dL   HCT 35.9 (L) 39.0 - 52.0 %   MCV 89.3 78.0 - 100.0 fL   MCH 29.4 26.0 - 34.0 pg   MCHC 32.9 30.0 - 36.0 g/dL   RDW 15.7 (H) 11.5 - 15.5 %   Platelets 341 150 - 400 K/uL  Heparin level (unfractionated)     Status: None   Collection Time: 05/29/15  4:26 AM  Result Value Ref Range   Heparin Unfractionated 0.37 0.30 - 0.70 IU/mL    Comment:        IF HEPARIN RESULTS ARE BELOW EXPECTED VALUES, AND PATIENT DOSAGE HAS BEEN CONFIRMED, SUGGEST FOLLOW UP TESTING OF ANTITHROMBIN III LEVELS.   Basic metabolic panel     Status: None   Collection Time: 05/29/15  4:26 AM  Result Value Ref Range   Sodium 135 135 - 145 mmol/L   Potassium 4.2 3.5 - 5.1 mmol/L   Chloride 102 101 - 111 mmol/L   CO2 24 22 - 32 mmol/L   Glucose, Bld 87 65 - 99 mg/dL   BUN 11 6 - 20 mg/dL   Creatinine, Ser 1.04 0.61 - 1.24 mg/dL   Calcium 8.9 8.9 - 10.3 mg/dL   GFR calc non Af Amer >60 >60 mL/min   GFR calc Af Amer >60 >60 mL/min    Comment: (NOTE) The eGFR has been calculated using the CKD EPI equation. This calculation has not been validated in all clinical situations. eGFR's persistently <60 mL/min  signify possible Chronic Kidney Disease.    Anion gap 9 5 - 15  CBC     Status: Abnormal   Collection Time: 05/30/15  7:56 AM  Result Value Ref Range   WBC 6.4 4.0 - 10.5 K/uL   RBC 4.28 4.22 - 5.81 MIL/uL   Hemoglobin 12.6 (L) 13.0 - 17.0 g/dL   HCT 37.5 (L) 39.0 - 52.0 %   MCV 87.6 78.0 - 100.0 fL   MCH 29.4 26.0 -  34.0 pg   MCHC 33.6 30.0 - 36.0 g/dL   RDW 15.5 11.5 - 15.5 %   Platelets 368 150 - 400 K/uL  Basic metabolic panel     Status: None   Collection Time: 05/30/15  7:56 AM  Result Value Ref Range   Sodium 136 135 - 145 mmol/L   Potassium 4.1 3.5 - 5.1 mmol/L   Chloride 103 101 - 111 mmol/L   CO2 25 22 - 32 mmol/L   Glucose, Bld 92 65 - 99 mg/dL   BUN 8 6 - 20 mg/dL   Creatinine, Ser 0.92 0.61 - 1.24 mg/dL   Calcium 9.1 8.9 - 10.3 mg/dL   GFR calc non Af Amer >60 >60 mL/min   GFR calc Af Amer >60 >60 mL/min    Comment: (NOTE) The eGFR has been calculated using the CKD EPI equation. This calculation has not been validated in all clinical situations. eGFR's persistently <60 mL/min signify possible Chronic Kidney Disease.    Anion gap 8 5 - 15  Platelet inhibition p2y12     Status: Abnormal   Collection Time: 05/30/15  7:56 AM  Result Value Ref Range   Platelet Function  P2Y12 168 (L) 194 - 418 PRU    Comment:        The literature has shown a direct correlation of PRU values over 230 with higher risks of thrombotic events.  Lower PRU values are associated with platelet inhibition.   Heparin level (unfractionated)     Status: None   Collection Time: 05/30/15  7:56 AM  Result Value Ref Range   Heparin Unfractionated 0.43 0.30 - 0.70 IU/mL    Comment:        IF HEPARIN RESULTS ARE BELOW EXPECTED VALUES, AND PATIENT DOSAGE HAS BEEN CONFIRMED, SUGGEST FOLLOW UP TESTING OF ANTITHROMBIN III LEVELS.      Lipid Panel     Component Value Date/Time   CHOL 162 04/07/2011 0151   TRIG 62 04/07/2011 0151   HDL 36* 04/07/2011 0151   CHOLHDL 4.5 04/07/2011  0151   VLDL 12 04/07/2011 0151   LDLCALC 114* 04/07/2011 0151     No results found for: HGBA1C   Lab Results  Component Value Date   LDLCALC 114* 04/07/2011   CREATININE 0.92 05/30/2015     HPI :*20 AA man with known CAD, PCI to pLAD and mLCX, moderate LV systolic dysfunction with LVEF 40%, seizure disorder, poor medical compliance with limited resources (now on disability), last cath in 09/2013 by Dr. Burt Knack (noted below), presents to ER today with left sided chest discomfort of moderate intensity, radiating to left arm associated with belching and mild diaphoresis. He has been having episodes of these symptoms for the past couple days. No syncope, fever, N/V, orthopnea, PND, pedal edema. He says he drove himself to ER today. In ER Tni 0.16 and 0.58. He has been started on heparin and NTG infusions. He reports significant improvement in his symptoms. BP is normal. He reports compliance with his medications although it cannot be confirmed.     HOSPITAL COURSE:    Coronary artery disease (s/p prior BMS to LCx 2009, PTCA to LCx 2012 for ISR after running out of Plavix, last cath 2014 with mild-moderate stenosis of distal LM, ostial Cx, RI, mid LAD)  Non-ST elevation MI, unstable angina-initially placed on nitroglycerin, and  IV heparin, . troponin trending down, chest pain-free, continue Plavix, Coreg,  2-D echo with results as above. Patient is status post cardiac  cath that shows an EF of 45-50%, three-vessel coronary artery disease. Seen by cardiothoracic surgery. Cardiology and cardiothoracic surgery to make final recommendations about continued stay in the hospital, continuation of the heparin and a nitroglycerin drip while the patient is waiting for his CABG.  Chronic systolic heart failure without exacerbation  Dyslipidemia-continue Lipitor  Seizure disorder-continue Keppra, phenobarbital  Hypertension-continue Coreg, holding lisinopril because of soft blood pressure  Nicotine  dependence   Discharge Exam:    Blood pressure 121/69, pulse 63, temperature 99.2 F (37.3 C), temperature source Oral, resp. rate 18, height $RemoveBe'6\' 2"'RvFeZxDBL$  (1.88 m), weight 85.322 kg (188 lb 1.6 oz), SpO2 100 %.   General: No acute respiratory distress Lungs: Clear to auscultation bilaterally without wheezes or crackles Cardiovascular: Regular rate and rhythm without murmur gallop or rub normal S1 and S2 Abdomen: Nontender, nondistended, soft, bowel sounds positive, no rebound, no ascites, no appreciable mass Extremities: No significant cyanosis, clubbing, or edema bilateral lower extremities  Discharge Instructions    Diet - low sodium heart healthy    Complete by:  As directed      Increase activity slowly    Complete by:  As directed            Follow-up Information    Schedule an appointment as soon as possible for a visit with Sherren Mocha, MD.   Specialty:  Cardiology   Why:  Office scheduler will contact you to arrange followup, if you do not hear from [place] in 2 business days, please give [place] a call   Contact information:   [address] [place] [address] [phone]       Signed: Reyne Dumas 05/30/2015, 10:39 PM        Time spent >45 mins

## 2015-05-30 NOTE — Progress Notes (Signed)
Pt has decided to leave AMA (AMA paper signed and in chart).  Cardiology Isaac Laud [place], [place]), TCTS (Dr. Servando Snare), and Attending MD (Dr. Allyson Sabal) were all notified.  Rx for SL ntg and Imdur 30mg  given to patient.  Contact # was verified.  Advised pt to take SL ntg as needed for CP and to call 911.  Pt states he will f/u with Cardiology and that he does have their phone #.

## 2015-05-30 NOTE — Care Management Note (Signed)
Case Management Note  Patient Details  Name: LANDO ALCALDE MRN: [id number] Date of Birth: [birth year]  Subjective/Objective:  Pt admitted for Nstemi. S/p cardiac cath- revealed severe multivessel disease. Recommendations for CABG. Pt to make decision.                  Action/Plan: CM to monitor for additional needs- consulted CSW as well.    Expected Discharge Date:                  Expected Discharge Plan:  [place]  In-House Referral:  Clinical Social Work (Pt was previously @ [employer] and is now a the [employer]. Pt states he was trying to get assistance via CSW @ [employer] for housing. CM did make CSW aware of plan of care. Pt's cell #  [phone])  Discharge planning Services  CM Consult (Pt has PCP: MD Lindwood Qua)  Post Acute Care Choice:    Choice offered to:     DME Arranged:    DME Agency:     HH Arranged:    HH Agency:     Status of Service:  In process, will continue to follow  Medicare Important Message Given:    Date Medicare IM Given:    Medicare IM give by:    Date Additional Medicare IM Given:    Additional Medicare Important Message give by:     If discussed at [place] of Stay Meetings, dates discussed:    Additional Comments:  Bethena Roys, RN 05/30/2015, 11:16 AM

## 2015-05-30 NOTE — Clinical Social Work Note (Signed)
Clinical Social Work Assessment  Patient Details  Name: [name] MRN: [id number] Date of Birth: 1961-03-10  Date of referral:  05/30/15               Reason for consult:  Housing Concerns/Homelessness                Permission sought to share information with:    Permission granted to share information::  No  Name::        Agency::     Relationship::     Contact Information:     Housing/Transportation Living arrangements for the past 2 months:  Hotel/Motel Source of Information:  Patient Patient Interpreter Needed:  None Criminal Activity/Legal Involvement Pertinent to Current Situation/Hospitalization:  No - Comment as needed Significant Relationships:  Friend Lives with:  Self Do you feel safe going back to the place where you live?  No Need for family participation in patient care:  No (Coment)  Care giving concerns:  Pt wanting to leave the hospital prior to surgery to work out "home situation" per notes and report   Facilities manager / plan:  CSW visited pt room to discuss consult for homelessness as well as other concerns. Pt upset with CSW upon beginning of conversation. When CSW asked pt to clarify a statement, pt felt CSW had an attitude. CSW apologized and explained CSW was only here to assist. Pt informed CSW prior to admission he was staying at the [facility]. Pt reports his car is here at the hospital with some of his belongings but most of his belongings are still at the motel. Pt explained to CSW that for multiple reasons he does not feel he should stay at the hospital until surgery. Reasons include: pt feels he is not receiving treatment that has to be done at the hospital while awaiting surgery and therefor is not only accumulating a hospital bill but also wasting money already paid at the motel (pt pays weekly on Fridays); pt also does not believe a motel will be a safe place to dc after surgery and would like to get his house taken care of and move in  prior to surgery; pt also does not feel comfortable with other people handling his belongings at the motel.  CSW discussed above reasons with pt as well as possible solutions. Pt informed CSW he would be open to SNF at dc instead of back to motel or home alone. However, pt and CSW unable to come up with solution for pt belongings and avoiding paying money at motel while he is not staying there. Pt is not open to CSW calling motel and asking them to store pt belongings, pt does not think it would be an option to dc to SNF and then pick up his belongings with SNF assistance, and pt not open to having friends take care of storing his belongings while he remains in the hospital.  CSW offered all possible options that CSW is aware of to pt, but pt was not able to find a compromise. At this time, pt would like to leave the hospital today with a friend to clear out his motel room and find new housing. CSW advised pt speak with MD further about concerns for him leaving before surgery. Pt agreeable to do so but CSW unsure if pt will change his mind. Pt did want CSW to note that he is not disagreeing to surgery, but just that he would like to take care of his  affairs prior to surgery.  Employment status:    Forensic scientist:  Medicaid In [place] PT Recommendations:  Not assessed at this time Information / Referral to community resources:   (None accepted/needed)  Patient/Family's Response to care:  Pt agreeable to surgery but unable to find a solution with CSW that does not involve him leaving the hospital prior to surgery.  Patient/Family's Understanding of and Emotional Response to Diagnosis, Current Treatment, and Prognosis:  Pt with limited understanding of his condition. Pt is able to appropriately answer questions about medical condition, however, pt instance on leaving the hospital causes concerns for pt overall understanding. Pt frustrated through out conversation. Pt fixated on home/living situation so  CSW unsure if pt is processing current diagnosis.   Emotional Assessment Appearance:  Appears stated age Attitude/Demeanor/Rapport:  Aggressive (Verbally and/or physically) (Pt unhappy with CSW "attitude" and became upset during assessment) Affect (typically observed):  Angry, Irritable Orientation:  Oriented to Self, Oriented to Place, Oriented to  Time, Oriented to Situation Alcohol / Substance use:  Not Applicable Psych involvement (Current and /or in the community):  No (Comment)  Discharge Needs  Concerns to be addressed:  Home Safety Concerns, Lack of Support Readmission within the last 30 days:  No Current discharge risk:  Lack of support system Barriers to Discharge:  Continued Medical Work up, Unsafe home situation   [place], [place]

## 2015-05-30 NOTE — Progress Notes (Signed)
Paged by nursing staff, patient decided to leave AMA. I have given him paper Rx for Imdur and sublingual nitro. I have also sent staff message to our scheduler to contact him within 7 days to arrange close cardiology followup.  Hilbert Corrigan PA Pager: [phone]

## 2015-05-30 NOTE — Progress Notes (Signed)
Physician progress note  [name] MRN: [id number] DOB/AGE: [id number] y.o.  PCP: Jenell Milliner, MD   Admit date: 05/26/2015 Discharge date: To be determined  Discharge Diagnoses:     Principal Problem:   NSTEMI (non-ST elevated myocardial infarction) Active Problems:   TOBACCO ABUSE   Ischemic cardiomyopathy   Seizure disorder  medical noncompliance        Medication List    STOP taking these medications        aspirin 325 MG tablet  Replaced by:  aspirin 81 MG EC tablet     lisinopril 10 MG tablet  Commonly known as:  PRINIVIL,ZESTRIL      TAKE these medications        acetaminophen 500 MG tablet  Commonly known as:  TYLENOL  Take 1,000 mg by mouth every 6 (six) hours as needed for mild pain, moderate pain or headache.     aspirin 81 MG EC tablet  Take 1 tablet (81 mg total) by mouth daily.     atorvastatin 20 MG tablet  Commonly known as:  LIPITOR  Take 20 mg by mouth daily.     carvedilol 6.25 MG tablet  Commonly known as:  COREG  Take 6.25 mg by mouth 2 (two) times daily with a meal.     clopidogrel 75 MG tablet  Commonly known as:  PLAVIX  Take 1 tablet (75 mg total) by mouth daily.     levETIRAcetam 500 MG 24 hr tablet  Commonly known as:  KEPPRA XR  Take 1,000 mg by mouth daily.     nicotine 14 mg/24hr patch  Commonly known as:  NICODERM CQ - dosed in mg/24 hours  Place 1 patch (14 mg total) onto the skin daily.     nitroGLYCERIN 0.4 MG SL tablet  Commonly known as:  NITROSTAT  Place 0.4 mg under the tongue every 5 (five) minutes as needed for chest pain.     PHENobarbital 32.4 MG tablet  Commonly known as:  LUMINAL  Take 32.4 mg by mouth 4 (four) times daily.     POTASSIUM PO  Take 1 tablet by mouth daily at 12 noon.         Discharge Condition: Stable  Disposition: 01-Home or Self Care   Consults: Cardiology    Significant Diagnostic Studies:  Dg Chest 2 View  05/26/2015   CLINICAL DATA:  Chest pain and shortness  of Breath for 2 days.  EXAM: CHEST  2 VIEW  COMPARISON:  12/09/2013  FINDINGS: The cardiac silhouette, mediastinal and hilar contours are within normal limits and stable. Coronary artery stents are noted. There is tortuosity of the thoracic aorta. There are mild emphysematous changes and chronic bronchitic type lung changes but no infiltrates, edema or effusions. The bony thorax is intact.  IMPRESSION: Chronic emphysematous and bronchitic type lung changes but no definite acute overlying pulmonary process.   Electronically Signed   By: Marijo Sanes M.D.   On: 05/26/2015 18:36    echocardiogram  LV EF: 40% -  45%  ------------------------------------------------------------------- Indications:   Chest pain 786.51.  ------------------------------------------------------------------- History:  PMH:  Coronary artery disease. PMH:  Myocardial infarction. Risk factors: Current tobacco use. Hypertension. Dyslipidemia.  ------------------------------------------------------------------- Study Conclusions  - Left ventricle: The cavity size was moderately dilated. Systolic function was mildly to moderately reduced. The estimated ejection fraction was in the range of 40% to 45%. There is akinesis of the entireinferolateral myocardium. Features are consistent with a pseudonormal left ventricular filling  pattern, with concomitant abnormal relaxation and increased filling pressure (grade 2 diastolic dysfunction). - Aortic valve: There was mild regurgitation. - Mitral valve: There was moderate regurgitation.   CARDIAC CATH and OTHER PROCEDURES-results pending at this time   Filed Weights   05/28/15 0421 05/29/15 0500 05/30/15 0400  Weight: 86.138 kg (189 lb 14.4 oz) 86.138 kg (189 lb 14.4 oz) 85.322 kg (188 lb 1.6 oz)     Microbiology: No results found for this or any previous visit (from the past 240 hour(s)).     Blood Culture    Component Value Date/Time   SDES  URINE, RANDOM 02/04/2009 0636   SPECREQUEST NONE 02/04/2009 0636   CULT NO GROWTH 02/04/2009 0636   REPTSTATUS 02/05/2009 FINAL 02/04/2009 0636      Labs: Results for orders placed or performed during the hospital encounter of 05/26/15 (from the past 48 hour(s))  CBC     Status: Abnormal   Collection Time: 05/29/15  4:26 AM  Result Value Ref Range   WBC 7.0 4.0 - 10.5 K/uL   RBC 4.02 (L) 4.22 - 5.81 MIL/uL   Hemoglobin 11.8 (L) 13.0 - 17.0 g/dL   HCT 35.9 (L) 39.0 - 52.0 %   MCV 89.3 78.0 - 100.0 fL   MCH 29.4 26.0 - 34.0 pg   MCHC 32.9 30.0 - 36.0 g/dL   RDW 15.7 (H) 11.5 - 15.5 %   Platelets 341 150 - 400 K/uL  Heparin level (unfractionated)     Status: None   Collection Time: 05/29/15  4:26 AM  Result Value Ref Range   Heparin Unfractionated 0.37 0.30 - 0.70 IU/mL    Comment:        IF HEPARIN RESULTS ARE BELOW EXPECTED VALUES, AND PATIENT DOSAGE HAS BEEN CONFIRMED, SUGGEST FOLLOW UP TESTING OF ANTITHROMBIN III LEVELS.   Basic metabolic panel     Status: None   Collection Time: 05/29/15  4:26 AM  Result Value Ref Range   Sodium 135 135 - 145 mmol/L   Potassium 4.2 3.5 - 5.1 mmol/L   Chloride 102 101 - 111 mmol/L   CO2 24 22 - 32 mmol/L   Glucose, Bld 87 65 - 99 mg/dL   BUN 11 6 - 20 mg/dL   Creatinine, Ser 1.04 0.61 - 1.24 mg/dL   Calcium 8.9 8.9 - 10.3 mg/dL   GFR calc non Af Amer >60 >60 mL/min   GFR calc Af Amer >60 >60 mL/min    Comment: (NOTE) The eGFR has been calculated using the CKD EPI equation. This calculation has not been validated in all clinical situations. eGFR's persistently <60 mL/min signify possible Chronic Kidney Disease.    Anion gap 9 5 - 15  CBC     Status: Abnormal   Collection Time: 05/30/15  7:56 AM  Result Value Ref Range   WBC 6.4 4.0 - 10.5 K/uL   RBC 4.28 4.22 - 5.81 MIL/uL   Hemoglobin 12.6 (L) 13.0 - 17.0 g/dL   HCT 37.5 (L) 39.0 - 52.0 %   MCV 87.6 78.0 - 100.0 fL   MCH 29.4 26.0 - 34.0 pg   MCHC 33.6 30.0 - 36.0 g/dL    RDW 15.5 11.5 - 15.5 %   Platelets 368 150 - 400 K/uL  Basic metabolic panel     Status: None   Collection Time: 05/30/15  7:56 AM  Result Value Ref Range   Sodium 136 135 - 145 mmol/L   Potassium 4.1 3.5 - 5.1 mmol/L  Chloride 103 101 - 111 mmol/L   CO2 25 22 - 32 mmol/L   Glucose, Bld 92 65 - 99 mg/dL   BUN 8 6 - 20 mg/dL   Creatinine, Ser 0.92 0.61 - 1.24 mg/dL   Calcium 9.1 8.9 - 10.3 mg/dL   GFR calc non Af Amer >60 >60 mL/min   GFR calc Af Amer >60 >60 mL/min    Comment: (NOTE) The eGFR has been calculated using the CKD EPI equation. This calculation has not been validated in all clinical situations. eGFR's persistently <60 mL/min signify possible Chronic Kidney Disease.    Anion gap 8 5 - 15  Platelet inhibition p2y12     Status: Abnormal   Collection Time: 05/30/15  7:56 AM  Result Value Ref Range   Platelet Function  P2Y12 168 (L) 194 - 418 PRU    Comment:        The literature has shown a direct correlation of PRU values over 230 with higher risks of thrombotic events.  Lower PRU values are associated with platelet inhibition.   Heparin level (unfractionated)     Status: None   Collection Time: 05/30/15  7:56 AM  Result Value Ref Range   Heparin Unfractionated 0.43 0.30 - 0.70 IU/mL    Comment:        IF HEPARIN RESULTS ARE BELOW EXPECTED VALUES, AND PATIENT DOSAGE HAS BEEN CONFIRMED, SUGGEST FOLLOW UP TESTING OF ANTITHROMBIN III LEVELS.      Lipid Panel     Component Value Date/Time   CHOL 162 04/07/2011 0151   TRIG 62 04/07/2011 0151   HDL 36* 04/07/2011 0151   CHOLHDL 4.5 04/07/2011 0151   VLDL 12 04/07/2011 0151   LDLCALC 114* 04/07/2011 0151     No results found for: HGBA1C   Lab Results  Component Value Date   LDLCALC 114* 04/07/2011   CREATININE 0.92 05/30/2015     HPI :*18 AA man with known CAD, PCI to pLAD and mLCX, moderate LV systolic dysfunction with LVEF 40%, seizure disorder, poor medical compliance with limited  resources (now on disability), last cath in 09/2013 by Dr. Burt Knack (noted below), presents to ER today with left sided chest discomfort of moderate intensity, radiating to left arm associated with belching and mild diaphoresis. He has been having episodes of these symptoms for the past couple days. No syncope, fever, N/V, orthopnea, PND, pedal edema. He says he drove himself to ER today. In ER Tni 0.16 and 0.58. He has been started on heparin and NTG infusions. He reports significant improvement in his symptoms. BP is normal. He reports compliance with his medications although it cannot be confirmed.     HOSPITAL COURSE:    Coronary artery disease (s/p prior BMS to LCx 2009, PTCA to LCx 2012 for ISR after running out of Plavix, last cath 2014 with mild-moderate stenosis of distal LM, ostial Cx, RI, mid LAD)  Non-ST elevation MI, unstable angina-initially placed on nitroglycerin, and  IV heparin, . troponin trending down, chest pain-free, continue Plavix, Coreg,  2-D echo with results as above. Patient is status post cardiac cath that shows an EF of 45-50%, three-vessel coronary artery disease. Seen by cardiothoracic surgery. Cardiology and cardiothoracic surgery to make final recommendations about continued stay in the hospital, continuation of the heparin and a nitroglycerin drip while the patient is waiting for his CABG.  Chronic systolic heart failure without exacerbation  Dyslipidemia-continue Lipitor  Seizure disorder-continue Keppra, phenobarbital  Hypertension-continue Coreg, holding lisinopril because of  soft blood pressure  Nicotine dependence   Discharge Exam:    Blood pressure 105/66, pulse 59, temperature 98.9 F (37.2 C), temperature source Oral, resp. rate 18, height $RemoveBe'6\' 2"'lVFHmxvpL$  (1.88 m), weight 85.322 kg (188 lb 1.6 oz), SpO2 100 %.   General: No acute respiratory distress Lungs: Clear to auscultation bilaterally without wheezes or crackles Cardiovascular: Regular rate and rhythm  without murmur gallop or rub normal S1 and S2 Abdomen: Nontender, nondistended, soft, bowel sounds positive, no rebound, no ascites, no appreciable mass Extremities: No significant cyanosis, clubbing, or edema bilateral lower extremities  Discharge Instructions    Diet - low sodium heart healthy    Complete by:  As directed      Increase activity slowly    Complete by:  As directed            Follow-up Information    Follow up with Jenell Milliner, MD. Schedule an appointment as soon as possible for a visit in 1 week.   Specialty:  Cardiology   Contact information:   [address] [place] [address] [phone]       Signed: Reyne Dumas 05/30/2015, 1:40 PM        Time spent >45 mins

## 2015-05-31 ENCOUNTER — Telehealth: Payer: Self-pay | Admitting: Cardiovascular Disease

## 2015-05-31 LAB — HEMOGLOBIN A1C
Hgb A1c MFr Bld: 6 % — ABNORMAL HIGH (ref 4.8–5.6)
Mean Plasma Glucose: 126 mg/dL

## 2015-05-31 NOTE — Telephone Encounter (Signed)
Patient contacted regarding discharge from [facility] on May 29, 2015.  Patient understands to follow up with provider Melina Copa, PA-C on June 07, 2015 at 8:30AM at [facility] Patient understands discharge instructions? yes Patient understands medications and regiment? yes Patient understands to bring all medications to this visit? yes

## 2015-05-31 NOTE — Telephone Encounter (Signed)
TCM  Appt with Melina Copa on 08/17 @ 8:30a  Per Staff mesages

## 2015-06-01 ENCOUNTER — Encounter (HOSPITAL_COMMUNITY): Payer: Medicaid Other

## 2015-06-01 ENCOUNTER — Encounter: Payer: Self-pay | Admitting: Physician Assistant

## 2015-06-01 NOTE — Progress Notes (Signed)
Received message from [place] that I will be seeing this patient in follow-up next week. The patient had left AMA from the hospital after recommendation for CABG. Plavix was discontinued in prep for this and he was continued on aspirin. In the message, the RN had reviewed the case with Dr. Servando Snare and they posed the question of whether he should be back on Plavix since we do not have a definitive date for surgery. I spoke with Dr. Claiborne Billings this morning who didn't really think putting him back on Plavix was a good idea. He felt that instead of seeing [place] in follow-up, the next step was instead to have the patient come back to see TCTS to arrange a date for surgery to put the ball in motion. Ryan spoke with Dr. Servando Snare and it turns out the patient never fully agreed to surgery yet. The last time he saw the patient, the patient said he was not having it - and when he went back to speak with the patient, he was gone. At last rounding note with Dr. Claiborne Billings it sounds like the patient was considering it and wanted to get affairs in order before then.   Dr. Servando Snare also mentioned he may or may not need his mitral valve fixed so he might need a TEE pre-op. Dr. Servando Snare said he needs the complete workup and make sure the patient wants the surgery before he can see him back. Dr. Claiborne Billings is concerned that if we put him back on Plavix and then he comes back into the hospital again in the meantime with unstable angina, the patient definitely will not wait another 5 days for washout period again (and the cycle would just continue). Thus we will continue original plan per discharge summary - continue aspirin and keep f/u as scheduled next week in clinic with me.  Niurka Benecke PA-C

## 2015-06-06 ENCOUNTER — Encounter: Payer: Self-pay | Admitting: Physician Assistant

## 2015-06-06 DIAGNOSIS — I5042 Chronic combined systolic (congestive) and diastolic (congestive) heart failure: Secondary | ICD-10-CM | POA: Insufficient documentation

## 2015-06-06 DIAGNOSIS — I10 Essential (primary) hypertension: Secondary | ICD-10-CM | POA: Insufficient documentation

## 2015-06-06 DIAGNOSIS — Z9119 Patient's noncompliance with other medical treatment and regimen: Secondary | ICD-10-CM | POA: Insufficient documentation

## 2015-06-06 DIAGNOSIS — Z91199 Patient's noncompliance with other medical treatment and regimen due to unspecified reason: Secondary | ICD-10-CM | POA: Insufficient documentation

## 2015-06-06 DIAGNOSIS — I340 Nonrheumatic mitral (valve) insufficiency: Secondary | ICD-10-CM | POA: Insufficient documentation

## 2015-06-06 DIAGNOSIS — I5020 Unspecified systolic (congestive) heart failure: Secondary | ICD-10-CM

## 2015-06-06 HISTORY — DX: Unspecified systolic (congestive) heart failure: I50.20

## 2015-06-06 NOTE — Progress Notes (Deleted)
Cardiology Office Note Date:  06/06/2015  Patient ID:  [name], [place] Oct 29, 1960, MRN [id number] PCP:  Jenell Milliner, MD  Cardiologist:  Burt Knackrefresh   Chief Complaint: f/u NSTEMI  History of Present Illness: [name] is a 54 y.o. male with history of known CAD (s/p prior BMS to LCx 2009, PTCA to LCx 2012 for ISR after running out of Plavix, recently found to have 3V disease), chronic systolic CHF EF 80-99%, valvular heart disease with mild AI/mod MR, hyperglycemia A1C 6.0, frequent seizure disorder, poor medical compliance with limited resources/homelessness (now on disability) who presents for follow-up of recent hospitalization. He was recently admitted with chest pain and NSTEMI after again running out of his Plavix. Cardiac cath revealing 3 vessel disease, LVEF 45-50%. Plavix was held. TCTS was consulted for CABG. 2D echo 05/28/15: EF 40-45%, akinesis of entire inferolateral myocardium grade 2 DD, mild AI, mod MR. Throughout much of his hospitalization he threatened to leave AMA. He eventually refused to stay any longer while awaiting Plavix washout, and left AMA 05/30/15. Dr. Servando Snare did see the patient but at the time he had not agreed to surgery. Followup was arranged in our office to determine if he would now be willing to go forward. Last BMET and CMET unremarkable with Hgb 11.8  tcts ?tee to eval MR acei comment   Past Medical History  Diagnosis Date  . CAD (coronary artery disease)     a. Overlapping BMS-prox LCx in 2009 b. NSTEMI 2012 due to prox LCx ISR s/p PTCA alone; EF 45% w/ basal inf HK. Had run out of Plavix 1 week prior. c. NSTEMI 05/2015 after running out of Plavix - cath with 3V CAD, Patient left AMA prior to agreeing to CABG.  [name] HLD (hyperlipidemia)   . Seizure disorder   . Hypertension   . TOBACCO ABUSE 02/22/2009  . Clotting disorder   . Seizures   . Chronic systolic CHF (congestive heart failure)     a. Last EF assessment 40-45% by echo, 45-50% by  cath 05/2015.  . MI (myocardial infarction) x 7  . Poor compliance   . Valvular heart disease     a. Echo 05/2015: mild AI, mod MR.  [name] Hyperglycemia A1C 6.0 05/2015.    Past Surgical History  Procedure Laterality Date  . Coronary angioplasty with stent placement  10/2007    BMS-prox LCx  . Coronary angioplasty  03/2011    PTCA-prox LCx ISR  . Left heart catheterization with coronary angiogram [name] 10/13/2013    Procedure: LEFT HEART CATHETERIZATION WITH CORONARY ANGIOGRAM;  Surgeon: Blane Ohara, MD;  Location: [facility] CATH LAB;  Service: Cardiovascular;  Laterality: [name];  . Cardiac catheterization [name] 05/29/2015    Procedure: Left Heart Cath and Coronary Angiography;  Surgeon: Belva Crome, MD;  Location: [place] CV LAB;  Service: Cardiovascular;  Laterality: [name];    Current Outpatient Prescriptions  Medication Sig Dispense Refill  . acetaminophen (TYLENOL) 500 MG tablet Take 1,000 mg by mouth every 6 (six) hours as needed for mild pain, moderate pain or headache.    [name] aspirin EC 81 MG EC tablet Take 1 tablet (81 mg total) by mouth daily. 30 tablet 1  . atorvastatin (LIPITOR) 20 MG tablet Take 20 mg by mouth daily.    . carvedilol (COREG) 6.25 MG tablet Take 6.25 mg by mouth 2 (two) times daily with a meal.    . isosorbide mononitrate (IMDUR) 30 MG 24 hr tablet  Take 1 tablet (30 mg total) by mouth daily. 30 tablet 5  . levETIRAcetam (KEPPRA XR) 500 MG 24 hr tablet Take 1,000 mg by mouth daily.     . nicotine (NICODERM CQ - DOSED IN MG/24 HOURS) 14 mg/24hr patch Place 1 patch (14 mg total) onto the skin daily. 28 patch 0  . nitroGLYCERIN (NITROSTAT) 0.4 MG SL tablet Place 1 tablet (0.4 mg total) under the tongue every 5 (five) minutes as needed for chest pain. 25 tablet 3  . PHENobarbital (LUMINAL) 32.4 MG tablet Take 32.4 mg by mouth 4 (four) times daily.     [name] POTASSIUM PO Take 1 tablet by mouth daily at 12 noon.     No current facility-administered medications for this visit.     Allergies:   Food allergy formula   Social History:  The patient  reports that he has been smoking Cigars.  He has never used smokeless tobacco. He reports that he does not drink alcohol or use illicit drugs.   Family History:  The patient's family history includes Coronary artery disease in his maternal aunt; Heart disease in his maternal uncle; Stroke in his maternal uncle.***  ROS:  Please see the history of present illness. Otherwise, review of systems is positive for ***.   All other systems are reviewed and otherwise negative.   PHYSICAL EXAM: *** VS:  There were no vitals taken for this visit. BMI: There is no weight on file to calculate BMI. Well nourished, well developed, in no acute distress HEENT: normocephalic, atraumatic Neck: no JVD, carotid bruits or masses Cardiac:  normal S1, S2; RRR; no murmurs, rubs, or gallops Lungs:  clear to auscultation bilaterally, no wheezing, rhonchi or rales Abd: soft, nontender, no hepatomegaly, + BS MS: no deformity or atrophy Ext: no edema Skin: warm and dry, no rash Neuro:  moves all extremities spontaneously, no focal abnormalities noted, follows commands Psych: euthymic mood, full affect   EKG:  Done today shows ***  Recent Labs: 05/27/2015: B Natriuretic Peptide 43.9; TSH 1.566 05/28/2015: ALT 18 05/30/2015: BUN 8; Creatinine, Ser 0.92; Hemoglobin 12.6*; Platelets 368; Potassium 4.1; Sodium 136  No results found for requested labs within last 365 days.   Estimated Creatinine Clearance: 108 mL/min (by C-G formula based on Cr of 0.92).   Wt Readings from Last 3 Encounters:  05/30/15 188 lb 1.6 oz (85.322 kg)  03/16/14 199 lb (90.266 kg)  01/20/14 202 lb (91.627 kg)     Other studies reviewed: Additional studies/records reviewed today include: summarized above***  ASSESSMENT AND PLAN:  1. CAD as above with recent NSTEMI 2. Essential HTN 3. Mod MR by recent echo 4. Chronic systolic CHF 5. H/o noncompliance  Disposition:  F/u with ***  Current medicines are reviewed at length with the patient today.  The patient did not have any concerns regarding medicines.***  Signed, Melina Copa PA-C 06/06/2015 4:15 PM     [address] [address] [phone] (office)  [phone] (fax)

## 2015-06-07 ENCOUNTER — Encounter: Payer: Self-pay | Admitting: Physician Assistant

## 2015-06-07 ENCOUNTER — Other Ambulatory Visit: Payer: Self-pay | Admitting: Physician Assistant

## 2015-06-07 ENCOUNTER — Encounter: Payer: Medicaid Other | Admitting: Physician Assistant

## 2015-06-07 ENCOUNTER — Ambulatory Visit (INDEPENDENT_AMBULATORY_CARE_PROVIDER_SITE_OTHER): Payer: Medicare Other | Admitting: Physician Assistant

## 2015-06-07 VITALS — BP 102/54 | HR 79 | Ht 74.0 in | Wt 188.0 lb

## 2015-06-07 DIAGNOSIS — I340 Nonrheumatic mitral (valve) insufficiency: Secondary | ICD-10-CM

## 2015-06-07 DIAGNOSIS — I25118 Atherosclerotic heart disease of native coronary artery with other forms of angina pectoris: Secondary | ICD-10-CM

## 2015-06-07 DIAGNOSIS — Z91199 Patient's noncompliance with other medical treatment and regimen due to unspecified reason: Secondary | ICD-10-CM

## 2015-06-07 DIAGNOSIS — I10 Essential (primary) hypertension: Secondary | ICD-10-CM

## 2015-06-07 DIAGNOSIS — Z9119 Patient's noncompliance with other medical treatment and regimen: Secondary | ICD-10-CM

## 2015-06-07 DIAGNOSIS — I222 Subsequent non-ST elevation (NSTEMI) myocardial infarction: Secondary | ICD-10-CM

## 2015-06-07 DIAGNOSIS — I214 Non-ST elevation (NSTEMI) myocardial infarction: Secondary | ICD-10-CM

## 2015-06-07 MED ORDER — CARVEDILOL 3.125 MG PO TABS: 3.1250 mg | ORAL_TABLET | Freq: Two times a day (BID) | ORAL | Status: DC

## 2015-06-07 MED ORDER — CLOPIDOGREL BISULFATE 75 MG PO TABS: 75.0000 mg | ORAL_TABLET | Freq: Every day | ORAL | Status: DC

## 2015-06-07 MED ORDER — ISOSORBIDE MONONITRATE ER 30 MG PO TB24: 15.0000 mg | ORAL_TABLET | Freq: Every day | ORAL | Status: DC

## 2015-06-07 NOTE — Progress Notes (Signed)
This encounter was created in error - please disregard.

## 2015-06-07 NOTE — Patient Instructions (Addendum)
Medication Instructions:  Decrease Imdur ( 15 mg ) daily, one half tablet Decrease Coreg ( 3.125 mg ) twice a day Restart Plavix ( 75 mg ) daily Sent in to patient's requested pharmacy [facility] on [address]  Labwork: -None-  Testing/Procedures: Your physician has requested that you have a TEE. During a TEE, sound waves are used to create images of your heart. It provides your doctor with information about the size and shape of your heart and how well your heart's chambers and valves are working. In this test, a transducer is attached to the end of a flexible tube that's guided down your throat and into your esophagus (the tube leading from you mouth to your stomach) to get a more detailed image of your heart. You are not awake for the procedure. Please see the instruction sheet given to you today. For further information please visit [email or web address].  Your provider has recommended a cardioversion.   You are scheduled for a TEE on Friday, August 26 at 10:00 am with Dr. Johnsie Cancel or associates. Please go to [facility] [place] Stay at 8:30 am.  Enter through the [facility] not have any food or drink after midnight on Thursday, August 25.  You may take your medicines with a sip of water on the day of your procedure.  You will need someone to drive you home following your procedure.   Call the [place] office at [phone] if you have any questions, problems or concerns.       AFTER THE PROCEDURE Your heart rhythm will be watched to make sure it does not change. You will need someone to drive you home.         Follow-Up: Your physician recommends that you keep your scheduled follow up appointment with DR. Cooper.    Any Other Special Instructions Will Be Listed Below (If Applicable). Please let us know as soon as you make a decision about surgery.

## 2015-06-07 NOTE — Progress Notes (Signed)
Cardiology Office Note Date:  06/07/2015  Patient ID:  [name], [name] [birth year], MRN [id number] PCP:  Jenell Milliner, MD  Cardiologist: Burt Knack  Chief Complaint: f/u NSTEMI  History of Present Illness: [name] is a 54 y.o. 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 He was recently admitted with chest pain and NSTEMI after again running out of his Plavix. Cardiac cath revealing 3 vessel disease, LVEF 45-50%. Plavix was held. TCTS was consulted for CABG. 2D echo 05/28/15: EF 40-45%, akinesis of entire inferolateral myocardium grade 2 DD, mild AI, mod MR. Throughout much of his hospitalization he threatened to leave AMA. He eventually refused to stay any longer while awaiting Plavix washout, and left AMA 05/30/15. Dr. Servando Snare did see the patient but at that time, he had not fully agreed to surgery. Followup was arranged in our office to determine if he would now be willing to go forward. Last BMET and CMET unremarkable with Hgb 11.8.  The patient originally no-showed for his appointment today then rescheduled to this afternoon. He reports he is feeling fine since leaving the hospital. He denies any further chest pain or SOB. He says he was unhappy with the interaction he had with Dr. Servando Snare in the hospital and does not want to see him again. He says he was told he may not make it if he leaves the hospital "but look, here I am." He still does not want to commit to having surgery. He says he trusts in God to to look out for him during this time. He says he has to get things in order with living arrangements before going forward. He reports  he is not prepared to be debilitated for weeks after surgery. We discussed the risk of delaying surgery multiple times during the visit. He says he ran out of carvedilol 1 day ago.   Past Medical History  Diagnosis Date  . CAD (coronary artery disease)     a. Overlapping BMS-prox LCx in 2009 b. NSTEMI 2012 due to prox LCx ISR s/p PTCA alone; EF 45% w/ basal inf HK. Had run out of Plavix 1 week prior. c. NSTEMI 05/2015 after running out of Plavix - cath with 3V CAD, Patient left AMA prior to agreeing to CABG.  [name] HLD (hyperlipidemia)   . Seizure disorder   . Hypertension   . TOBACCO ABUSE 02/22/2009  . Clotting disorder   . Seizures   . Chronic systolic CHF (congestive heart failure)     a. Last EF assessment 40-45% by echo, 45-50% by cath 05/2015.  . MI (myocardial infarction) x 7  . Poor compliance   . Valvular heart disease     a. Echo 05/2015: mild AI, mod MR.  [name] Hyperglycemia A1C 6.0 05/2015.    Past Surgical History  Procedure Laterality Date  . Coronary angioplasty with stent placement  10/2007    BMS-prox LCx  . Coronary angioplasty  03/2011    PTCA-prox LCx ISR  . Left heart catheterization with coronary angiogram [name] 10/13/2013    Procedure: LEFT HEART CATHETERIZATION WITH CORONARY ANGIOGRAM;  Surgeon: Blane Ohara, MD;  Location: [facility] CATH LAB;  Service: Cardiovascular;  Laterality: [name];  . Cardiac catheterization [name] 05/29/2015    Procedure: Left Heart Cath and Coronary Angiography;  Surgeon: Belva Crome, MD;  Location: [facility] CV LAB;  Service: Cardiovascular;  Laterality: [name];    Current Outpatient Prescriptions  Medication Sig Dispense Refill  . acetaminophen (TYLENOL) 500 MG tablet Take 1,000 mg by mouth every 6 (six) hours as needed for mild pain, moderate pain or headache.    [name] aspirin EC 81 MG EC tablet Take 1 tablet (81 mg total) by mouth daily. 30 tablet 1  . atorvastatin (LIPITOR) 20 MG tablet Take 20 mg by mouth daily.    . carvedilol (COREG) 6.25 MG tablet  Take 6.25 mg by mouth 2 (two) times daily with a meal.    . isosorbide mononitrate (IMDUR) 30 MG 24 hr tablet Take 1 tablet (30 mg total) by mouth daily. 30 tablet 5  . levETIRAcetam (KEPPRA XR) 500 MG 24 hr tablet Take 1,000 mg by mouth daily.     . nicotine (NICODERM CQ - DOSED IN MG/24 HOURS) 14 mg/24hr patch Place 1 patch (14 mg total) onto the skin daily. 28 patch 0  . nitroGLYCERIN (NITROSTAT) 0.4 MG SL tablet Place 1 tablet (0.4 mg total) under the tongue every 5 (five) minutes as needed for chest pain. 25 tablet 3  . PHENobarbital (LUMINAL) 32.4 MG tablet Take 32.4 mg by mouth 4 (four) times daily.     [name] POTASSIUM PO Take 1 tablet by mouth daily at 12 noon.     No current facility-administered medications for this visit.    Allergies:   Food allergy formula   Social History:  The patient  reports that he has been smoking Cigars.  He has never used smokeless tobacco. He reports that he does not drink alcohol or use illicit drugs.   Family History:  The patient's family history includes Coronary artery disease in his maternal aunt; Heart attack in his maternal uncle; Heart disease in his maternal uncle; Hypertension in an other family member; Stroke in his maternal uncle.  ROS:  Please see the history of present illness.   No LEE, orthopnea, PND. All other systems are reviewed and otherwise negative.   PHYSICAL EXAM:  VS:  BP 102/54 mmHg  Pulse 79  Ht 6\' 2"  (1.88 m)  Wt 188 lb (85.276 kg)  BMI 24.13 kg/m2 BMI: Body mass index is 24.13 kg/(m^2). Initial BP 90/62. Well nourished, well developed AAM, in no acute distress HEENT: normocephalic, atraumatic Neck: no JVD, carotid bruits or masses Cardiac:  normal S1, S2; RRR; very soft SEM at apex, no rubs or gallops Lungs:  clear to auscultation bilaterally, no wheezing, rhonchi or rales Abd: soft, nontender, no hepatomegaly, + BS MS: no deformity or atrophy Ext: no edema, right radial cath site without hematoma, ecchymosis. Good  pulse. Skin: warm and dry, no rash Neuro:  moves all extremities spontaneously, no focal abnormalities noted, follows commands Psych: euthymic mood, full affect   EKG:  Done today shows NSR with LVH with pronounced T waves in precordial leads. Not significantly changed from prior.  Recent Labs: 05/27/2015: B Natriuretic Peptide 43.9; TSH 1.566 05/28/2015: ALT 18 05/30/2015: BUN 8; Creatinine, Ser 0.92; Hemoglobin 12.6*; Platelets 368; Potassium 4.1; Sodium 136  No results found for requested labs within last 365 days.   Estimated Creatinine Clearance: 108 mL/min (by C-G formula based on Cr of 0.92).   Wt Readings from Last 3 Encounters:  06/07/15 188 lb (85.276 kg)  05/30/15  188 lb 1.6 oz (85.322 kg)  03/16/14 199 lb (90.266 kg)     Other studies reviewed: Additional studies/records reviewed today include: summarized above  ASSESSMENT AND PLAN:  1. CAD as above with recent NSTEMI - the patient wishes to continue to defer CABG at this time, pending getting affairs and living situation in order. We discussed the risk of delaying surgery including death, arrhythmia, worsening CHF. He verbalizes understanding of these risks. Will continue aspirin and statin. Have asked him to restart BB but will give lower dose of carvedilol 3.125mg  BID given softer BP in clinic. Will also reduce Imdur to 15mg  daily to accomodate this readdition - initial BP 90/62 with recheck by me of 102/54. Since he has still not committed to surgery, I agree with Dr. Servando Snare that he would benefit from restarting Plavix. I also discussed this with Dr. Meda Coffee (DOD) today. Will prescribe. I have been in touch with Levonne Spiller, TCTS RN to keep her updated of the patient's decision. At this time he wishes not to see Dr. Servando Snare again. I told the patient that as soon as he decides to go forward with surgery to please let us know ASAP and we can arrange f/u with a different provider.  2. Mitral regurgitation - Dr. Servando Snare felt the  patient would need a TEE prior to surgery to assess concomitant MR in case he needs valve surgery as well. The patient is agreeable.  The risks of esophageal damage, perforation (1:10,000 risk), bleeding, pharyngeal hematoma as well as other potential complications associated with conscious sedation including aspiration, arrhythmia, respiratory failure, death were discussed with the patient and he is amenable to proceeding,  3. Essential HTN - see above re: med changes.  4. Chronic systolic CHF - appears compensated. BP too low for ACEI/ARB. 5. H/o noncompliance - importance of med and follow-up compliance reinforced.  Disposition: F/u with Dr. Burt Knack in 4 weeks, sooner if needed.  Current medicines are reviewed at length with the patient today.  The patient did not have any concerns regarding medicines.  Raechel Ache PA-C 06/07/2015 3:09 PM     [address] [place] [address] [phone] (office)  [phone] (fax)

## 2015-06-08 ENCOUNTER — Ambulatory Visit: Payer: Medicaid Other | Admitting: Cardiothoracic Surgery

## 2015-06-12 DIAGNOSIS — R079 Chest pain, unspecified: Secondary | ICD-10-CM | POA: Diagnosis not present

## 2015-06-15 MED ORDER — SODIUM CHLORIDE 0.9 % IV SOLN
INTRAVENOUS | Status: DC
  Administered 2015-06-16: 500 mL via INTRAVENOUS

## 2015-06-16 ENCOUNTER — Encounter (HOSPITAL_COMMUNITY)
Admission: RE | Disposition: A | Discharge status: Home / Self Care | Payer: Self-pay | Source: Ambulatory Visit | Attending: Cardiovascular Disease

## 2015-06-16 ENCOUNTER — Ambulatory Visit (HOSPITAL_BASED_OUTPATIENT_CLINIC_OR_DEPARTMENT_OTHER)
Admission: RE | Admit: 2015-06-16 | Discharge: 2015-06-16 | Disposition: A | Discharge status: Home / Self Care | Payer: Medicare Other | Source: Ambulatory Visit | Attending: Physician Assistant | Admitting: Physician Assistant

## 2015-06-16 ENCOUNTER — Ambulatory Visit (HOSPITAL_COMMUNITY)
Admission: RE | Admit: 2015-06-16 | Discharge: 2015-06-16 | Disposition: A | Discharge status: Home / Self Care | Payer: Medicare Other | Source: Ambulatory Visit | Attending: Cardiovascular Disease | Admitting: Cardiovascular Disease

## 2015-06-16 ENCOUNTER — Encounter (HOSPITAL_COMMUNITY): Payer: Self-pay | Admitting: *Deleted

## 2015-06-16 DIAGNOSIS — R739 Hyperglycemia, unspecified: Secondary | ICD-10-CM | POA: Insufficient documentation

## 2015-06-16 DIAGNOSIS — F17290 Nicotine dependence, other tobacco product, uncomplicated: Secondary | ICD-10-CM | POA: Diagnosis not present

## 2015-06-16 DIAGNOSIS — E785 Hyperlipidemia, unspecified: Secondary | ICD-10-CM | POA: Diagnosis not present

## 2015-06-16 DIAGNOSIS — I5022 Chronic systolic (congestive) heart failure: Secondary | ICD-10-CM | POA: Insufficient documentation

## 2015-06-16 DIAGNOSIS — Z79899 Other long term (current) drug therapy: Secondary | ICD-10-CM | POA: Insufficient documentation

## 2015-06-16 DIAGNOSIS — I2510 Atherosclerotic heart disease of native coronary artery without angina pectoris: Secondary | ICD-10-CM | POA: Insufficient documentation

## 2015-06-16 DIAGNOSIS — I340 Nonrheumatic mitral (valve) insufficiency: Secondary | ICD-10-CM | POA: Insufficient documentation

## 2015-06-16 DIAGNOSIS — Z590 Homelessness: Secondary | ICD-10-CM | POA: Insufficient documentation

## 2015-06-16 DIAGNOSIS — I252 Old myocardial infarction: Secondary | ICD-10-CM | POA: Diagnosis not present

## 2015-06-16 DIAGNOSIS — Z8249 Family history of ischemic heart disease and other diseases of the circulatory system: Secondary | ICD-10-CM | POA: Insufficient documentation

## 2015-06-16 DIAGNOSIS — Z7982 Long term (current) use of aspirin: Secondary | ICD-10-CM | POA: Insufficient documentation

## 2015-06-16 DIAGNOSIS — I10 Essential (primary) hypertension: Secondary | ICD-10-CM | POA: Insufficient documentation

## 2015-06-16 DIAGNOSIS — G40909 Epilepsy, unspecified, not intractable, without status epilepticus: Secondary | ICD-10-CM | POA: Diagnosis not present

## 2015-06-16 DIAGNOSIS — Z955 Presence of coronary angioplasty implant and graft: Secondary | ICD-10-CM | POA: Diagnosis not present

## 2015-06-16 HISTORY — PX: TEE WITHOUT CARDIOVERSION: SHX5443

## 2015-06-16 SURGERY — ECHOCARDIOGRAM, TRANSESOPHAGEAL
Anesthesia: Moderate Sedation

## 2015-06-16 MED ORDER — FENTANYL CITRATE (PF) 100 MCG/2ML IJ SOLN
INTRAMUSCULAR | Status: AC
  Filled 2015-06-16: qty 2

## 2015-06-16 MED ORDER — BUTAMBEN-TETRACAINE-BENZOCAINE 2-2-14 % EX AERO
INHALATION_SPRAY | CUTANEOUS | Status: DC | PRN
  Administered 2015-06-16: 2 via TOPICAL

## 2015-06-16 MED ORDER — DIPHENHYDRAMINE HCL 50 MG/ML IJ SOLN
INTRAMUSCULAR | Status: AC
  Filled 2015-06-16: qty 1

## 2015-06-16 MED ORDER — MIDAZOLAM HCL 5 MG/ML IJ SOLN
INTRAMUSCULAR | Status: AC
  Filled 2015-06-16: qty 2

## 2015-06-16 MED ORDER — FENTANYL CITRATE (PF) 100 MCG/2ML IJ SOLN
INTRAMUSCULAR | Status: DC | PRN
  Administered 2015-06-16: 25 ug via INTRAVENOUS
  Administered 2015-06-16: 50 ug via INTRAVENOUS

## 2015-06-16 MED ORDER — MIDAZOLAM HCL 10 MG/2ML IJ SOLN
INTRAMUSCULAR | Status: DC | PRN
  Administered 2015-06-16 (×2): 2 mg via INTRAVENOUS

## 2015-06-16 NOTE — Interval H&P Note (Signed)
History and Physical Interval Note:  06/16/2015 11:52 AM  [name]  has presented today for surgery, with the diagnosis of MITRAL REGURGITATION  The various methods of treatment have been discussed with the patient and family. After consideration of risks, benefits and other options for treatment, the patient has consented to  Procedure(s): TRANSESOPHAGEAL ECHOCARDIOGRAM (TEE) ([name]) as a surgical intervention .  The patient's history has been reviewed, patient examined, no change in status, stable for surgery.  I have reviewed the patient's chart and labs.  Questions were answered to the patient's satisfaction.     Jenkins Rouge

## 2015-06-16 NOTE — Progress Notes (Signed)
  Echocardiogram Echocardiogram Transesophageal has been performed.  [name] 06/16/2015, 12:00 PM

## 2015-06-16 NOTE — CV Procedure (Signed)
TEE: 75 ug fentanyl and 4 mg versed  EF 45-50% mid and basal inferior wall hypokinesis Moderae ischemic MR may benefit from annuloplasty ring with CABG Normal RV/RA No ASD/PFO No LAA thrombus No aortic debris Mild TR AV sclerosis no stenosis   Jenkins Rouge

## 2015-06-16 NOTE — Discharge Instructions (Signed)
Transesophageal Echocardiogram °Transesophageal echocardiography (TEE) is a special type of test that produces images of the heart by using sound waves (echocardiogram). This type of echocardiography can obtain better images of the heart than standard echocardiography. TEE is done by passing a flexible tube down the esophagus. The heart is located in front of the esophagus. Because the heart and esophagus are close to one another, your health care provider can take very clear, detailed pictures of the heart via ultrasound waves. °TEE may be done: °· If your health care provider needs more information based on standard echocardiography findings. °· If you had a stroke. This might have happened because a clot formed in your heart. TEE can visualize different areas of the heart and check for clots. °· To check valve anatomy and function. °· To check for infection on the inside of your heart (endocarditis). °· To evaluate the dividing wall (septum) of the heart and presence of a hole that did not close after birth (patent foramen ovale or atrial septal defect). °· To help diagnose a tear in the wall of the aorta (aortic dissection). °· During cardiac valve surgery. This allows the surgeon to assess the valve repair before closing the chest. °· During a variety of other cardiac procedures to guide positioning of catheters. °· Sometimes before a cardioversion, which is a shock to convert heart rhythm back to normal. °LET YOUR HEALTH CARE PROVIDER KNOW ABOUT:  °· Any allergies you have. °· All medicines you are taking, including vitamins, herbs, eye drops, creams, and over-the-counter medicines. °· Previous problems you or members of your family have had with the use of anesthetics. °· Any blood disorders you have. °· Previous surgeries you have had. °· Medical conditions you have. °· Swallowing difficulties. °· An esophageal obstruction. °RISKS AND COMPLICATIONS  °Generally, TEE is a safe procedure. However, as with any  procedure, complications can occur. Possible complications include an esophageal tear (rupture). °BEFORE THE PROCEDURE  °· Do not eat or drink for 6 hours before the procedure or as directed by your health care provider. °· Arrange for someone to drive you home after the procedure. Do not drive yourself home. During the procedure, you will be given medicines that can continue to make you feel drowsy and can impair your reflexes. °· An IV access tube will be started in the arm. °PROCEDURE  °· A medicine to help you relax (sedative) will be given through the IV access tube. °· A medicine may be sprayed or gargled to numb the back of the throat. °· Your blood pressure, heart rate, and breathing (vital signs) will be monitored during the procedure. °· The TEE probe is a long, flexible tube. The tip of the probe is placed into the back of the mouth, and you will be asked to swallow. This helps to pass the tip of the probe into the esophagus. Once the tip of the probe is in the correct area, your health care provider can take pictures of the heart. °· TEE is usually not a painful procedure. You may feel the probe press against the back of the throat. The probe does not enter the trachea and does not affect your breathing. °AFTER THE PROCEDURE  °· You will be in bed, resting, until you have fully returned to consciousness. °· When you first awaken, your throat may feel slightly sore and will probably still feel numb. This will improve slowly over time. °· You will not be allowed to eat or drink until it   is clear that the numbness has improved. °· Once you have been able to drink, urinate, and sit on the edge of the bed without feeling sick to your stomach (nausea) or dizzy, you may be cleared to go home. °· You should have a friend or family member with you for the next 24 hours after your procedure. °Document Released: 12/28/2002 Document Revised: 10/12/2013 Document Reviewed: 04/08/2013 °[employer]® Patient Information  ©2015 [employer]. This information is not intended to replace advice given to you by your health care provider. Make sure you discuss any questions you have with your health care provider. ° °

## 2015-06-16 NOTE — Interval H&P Note (Signed)
History and Physical Interval Note:  06/16/2015 7:23 AM  [name]  has presented today for surgery, with the diagnosis of MITRAL REGURGITATION  The various methods of treatment have been discussed with the patient and family. After consideration of risks, benefits and other options for treatment, the patient has consented to  Procedure(s): TRANSESOPHAGEAL ECHOCARDIOGRAM (TEE) ([name]) as a surgical intervention .  The patient's history has been reviewed, patient examined, no change in status, stable for surgery.  I have reviewed the patient's chart and labs.  Questions were answered to the patient's satisfaction.     Jenkins Rouge

## 2015-06-16 NOTE — H&P (View-Only) (Signed)
Cardiology Office Note Date:  06/07/2015  Patient ID:  [name], [name] [birth year], MRN [id number] PCP:  Jenell Milliner, MD  Cardiologist: Burt Knack  Chief Complaint: f/u NSTEMI  History of Present Illness: Kevin HENCE is a 54 y.o. 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 He was recently admitted with chest pain and NSTEMI after again running out of his Plavix. Cardiac cath revealing 3 vessel disease, LVEF 45-50%. Plavix was held. TCTS was consulted for CABG. 2D echo 05/28/15: EF 40-45%, akinesis of entire inferolateral myocardium grade 2 DD, mild AI, mod MR. Throughout much of his hospitalization he threatened to leave AMA. He eventually refused to stay any longer while awaiting Plavix washout, and left AMA 05/30/15. Dr. Servando Snare did see the patient but at that time, he had not fully agreed to surgery. Followup was arranged in our office to determine if he would now be willing to go forward. Last BMET and CMET unremarkable with Hgb 11.8.  The patient originally no-showed for his appointment today then rescheduled to this afternoon. He reports he is feeling fine since leaving the hospital. He denies any further chest pain or SOB. He says he was unhappy with the interaction he had with Dr. Servando Snare in the hospital and does not want to see him again. He says he was told he may not make it if he leaves the hospital "but look, here I am." He still does not want to commit to having surgery. He says he trusts in God to to look out for him during this time. He says he has to get things in order with living arrangements before going forward. He reports  he is not prepared to be debilitated for weeks after surgery. We discussed the risk of delaying surgery multiple times during the visit. He says he ran out of carvedilol 1 day ago.   Past Medical History  Diagnosis Date  . CAD (coronary artery disease)     a. Overlapping BMS-prox LCx in 2009 b. NSTEMI 2012 due to prox LCx ISR s/p PTCA alone; EF 45% w/ basal inf HK. Had run out of Plavix 1 week prior. c. NSTEMI 05/2015 after running out of Plavix - cath with 3V CAD, Patient left AMA prior to agreeing to CABG.  [name] HLD (hyperlipidemia)   . Seizure disorder   . Hypertension   . TOBACCO ABUSE 02/22/2009  . Clotting disorder   . Seizures   . Chronic systolic CHF (congestive heart failure)     a. Last EF assessment 40-45% by echo, 45-50% by cath 05/2015.  . MI (myocardial infarction) x 7  . Poor compliance   . Valvular heart disease     a. Echo 05/2015: mild AI, mod MR.  [name] Hyperglycemia A1C 6.0 05/2015.    Past Surgical History  Procedure Laterality Date  . Coronary angioplasty with stent placement  10/2007    BMS-prox LCx  . Coronary angioplasty  03/2011    PTCA-prox LCx ISR  . Left heart catheterization with coronary angiogram [name] 10/13/2013    Procedure: LEFT HEART CATHETERIZATION WITH CORONARY ANGIOGRAM;  Surgeon: Blane Ohara, MD;  Location: [facility] CATH LAB;  Service: Cardiovascular;  Laterality: [name];  . Cardiac catheterization [name] 05/29/2015    Procedure: Left Heart Cath and Coronary Angiography;  Surgeon: Belva Crome, MD;  Location: [place] CV LAB;  Service: Cardiovascular;  Laterality: [name];    Current Outpatient Prescriptions  Medication Sig Dispense Refill  . acetaminophen (TYLENOL) 500 MG tablet Take 1,000 mg by mouth every 6 (six) hours as needed for mild pain, moderate pain or headache.    [name] aspirin EC 81 MG EC tablet Take 1 tablet (81 mg total) by mouth daily. 30 tablet 1  . atorvastatin (LIPITOR) 20 MG tablet Take 20 mg by mouth daily.    . carvedilol (COREG) 6.25 MG tablet  Take 6.25 mg by mouth 2 (two) times daily with a meal.    . isosorbide mononitrate (IMDUR) 30 MG 24 hr tablet Take 1 tablet (30 mg total) by mouth daily. 30 tablet 5  . levETIRAcetam (KEPPRA XR) 500 MG 24 hr tablet Take 1,000 mg by mouth daily.     . nicotine (NICODERM CQ - DOSED IN MG/24 HOURS) 14 mg/24hr patch Place 1 patch (14 mg total) onto the skin daily. 28 patch 0  . nitroGLYCERIN (NITROSTAT) 0.4 MG SL tablet Place 1 tablet (0.4 mg total) under the tongue every 5 (five) minutes as needed for chest pain. 25 tablet 3  . PHENobarbital (LUMINAL) 32.4 MG tablet Take 32.4 mg by mouth 4 (four) times daily.     [name] POTASSIUM PO Take 1 tablet by mouth daily at 12 noon.     No current facility-administered medications for this visit.    Allergies:   Food allergy formula   Social History:  The patient  reports that he has been smoking Cigars.  He has never used smokeless tobacco. He reports that he does not drink alcohol or use illicit drugs.   Family History:  The patient's family history includes Coronary artery disease in his maternal aunt; Heart attack in his maternal uncle; Heart disease in his maternal uncle; Hypertension in an other family member; Stroke in his maternal uncle.  ROS:  Please see the history of present illness.   No LEE, orthopnea, PND. All other systems are reviewed and otherwise negative.   PHYSICAL EXAM:  VS:  BP 102/54 mmHg  Pulse 79  Ht 6\' 2"  (1.88 m)  Wt 188 lb (85.276 kg)  BMI 24.13 kg/m2 BMI: Body mass index is 24.13 kg/(m^2). Initial BP 90/62. Well nourished, well developed AAM, in no acute distress HEENT: normocephalic, atraumatic Neck: no JVD, carotid bruits or masses Cardiac:  normal S1, S2; RRR; very soft SEM at apex, no rubs or gallops Lungs:  clear to auscultation bilaterally, no wheezing, rhonchi or rales Abd: soft, nontender, no hepatomegaly, + BS MS: no deformity or atrophy Ext: no edema, right radial cath site without hematoma, ecchymosis. Good  pulse. Skin: warm and dry, no rash Neuro:  moves all extremities spontaneously, no focal abnormalities noted, follows commands Psych: euthymic mood, full affect   EKG:  Done today shows NSR with LVH with pronounced T waves in precordial leads. Not significantly changed from prior.  Recent Labs: 05/27/2015: B Natriuretic Peptide 43.9; TSH 1.566 05/28/2015: ALT 18 05/30/2015: BUN 8; Creatinine, Ser 0.92; Hemoglobin 12.6*; Platelets 368; Potassium 4.1; Sodium 136  No results found for requested labs within last 365 days.   Estimated Creatinine Clearance: 108 mL/min (by C-G formula based on Cr of 0.92).   Wt Readings from Last 3 Encounters:  06/07/15 188 lb (85.276 kg)  05/30/15  188 lb 1.6 oz (85.322 kg)  03/16/14 199 lb (90.266 kg)     Other studies reviewed: Additional studies/records reviewed today include: summarized above  ASSESSMENT AND PLAN:  1. CAD as above with recent NSTEMI - the patient wishes to continue to defer CABG at this time, pending getting affairs and living situation in order. We discussed the risk of delaying surgery including death, arrhythmia, worsening CHF. He verbalizes understanding of these risks. Will continue aspirin and statin. Have asked him to restart BB but will give lower dose of carvedilol 3.125mg  BID given softer BP in clinic. Will also reduce Imdur to 15mg  daily to accomodate this readdition - initial BP 90/62 with recheck by me of 102/54. Since he has still not committed to surgery, I agree with Dr. Servando Snare that he would benefit from restarting Plavix. I also discussed this with Dr. Meda Coffee (DOD) today. Will prescribe. I have been in touch with Levonne Spiller, TCTS RN to keep her updated of the patient's decision. At this time he wishes not to see Dr. Servando Snare again. I told the patient that as soon as he decides to go forward with surgery to please let us know ASAP and we can arrange f/u with a different provider.  2. Mitral regurgitation - Dr. Servando Snare felt the  patient would need a TEE prior to surgery to assess concomitant MR in case he needs valve surgery as well. The patient is agreeable.  The risks of esophageal damage, perforation (1:10,000 risk), bleeding, pharyngeal hematoma as well as other potential complications associated with conscious sedation including aspiration, arrhythmia, respiratory failure, death were discussed with the patient and he is amenable to proceeding,  3. Essential HTN - see above re: med changes.  4. Chronic systolic CHF - appears compensated. BP too low for ACEI/ARB. 5. H/o noncompliance - importance of med and follow-up compliance reinforced.  Disposition: F/u with Dr. Burt Knack in 4 weeks, sooner if needed.  Current medicines are reviewed at length with the patient today.  The patient did not have any concerns regarding medicines.  Raechel Ache PA-C 06/07/2015 3:09 PM     [place] [address] [phone] (office)  [phone] (fax)

## 2015-06-16 NOTE — H&P (View-Only) (Signed)
This encounter was created in error - please disregard.

## 2015-06-20 ENCOUNTER — Encounter (HOSPITAL_COMMUNITY): Payer: Self-pay | Admitting: Cardiovascular Disease

## 2015-07-03 ENCOUNTER — Ambulatory Visit (INDEPENDENT_AMBULATORY_CARE_PROVIDER_SITE_OTHER): Payer: Medicare Other | Admitting: Cardiovascular Disease

## 2015-07-03 ENCOUNTER — Encounter: Payer: Self-pay | Admitting: Cardiovascular Disease

## 2015-07-03 VITALS — BP 120/80 | HR 85 | Ht 74.0 in | Wt 195.8 lb

## 2015-07-03 DIAGNOSIS — I25118 Atherosclerotic heart disease of native coronary artery with other forms of angina pectoris: Secondary | ICD-10-CM

## 2015-07-03 DIAGNOSIS — I25110 Atherosclerotic heart disease of native coronary artery with unstable angina pectoris: Secondary | ICD-10-CM | POA: Diagnosis not present

## 2015-07-03 DIAGNOSIS — I214 Non-ST elevation (NSTEMI) myocardial infarction: Secondary | ICD-10-CM

## 2015-07-03 DIAGNOSIS — I340 Nonrheumatic mitral (valve) insufficiency: Secondary | ICD-10-CM

## 2015-07-03 DIAGNOSIS — I222 Subsequent non-ST elevation (NSTEMI) myocardial infarction: Secondary | ICD-10-CM | POA: Diagnosis not present

## 2015-07-03 DIAGNOSIS — Z91199 Patient's noncompliance with other medical treatment and regimen due to unspecified reason: Secondary | ICD-10-CM

## 2015-07-03 DIAGNOSIS — I10 Essential (primary) hypertension: Secondary | ICD-10-CM | POA: Diagnosis not present

## 2015-07-03 DIAGNOSIS — Z9119 Patient's noncompliance with other medical treatment and regimen: Secondary | ICD-10-CM

## 2015-07-03 MED ORDER — CARVEDILOL 6.25 MG PO TABS: 6.2500 mg | ORAL_TABLET | Freq: Two times a day (BID) | ORAL | Status: DC

## 2015-07-03 NOTE — Progress Notes (Signed)
Cardiology Office Note Date:  07/04/2015   ID:  Myers Kevin, DOB Apr 12, 1961, MRN [id number]  PCP:  Carmie Kanner, NP  Cardiologist:  Sherren Mocha, MD    Chief Complaint  Patient presents with  . Coronary Artery Disease    History of Present Illness: [name] is a 54 y.o. male who presents for follow-up evaluation.  Patient is well-known to me. He has extensive CAD and has undergone previous stenting to the left circumflex. He has required balloon angioplasty to treat in-stent restenosis. He has a long history of medical noncompliance because of limited resources. Other problems include seizure disorder, prediabetes, valvular heart disease with mild aortic insufficiency and moderate mitral regurgitation, and poor social support. He has been homeless at times. The patient was hospitalized last month with chest pain and non-ST elevation infarction. He was found to have three-vessel coronary disease with total occlusion of the right coronary artery, total occlusion left circumflex, moderate stenosis of the left mainstem, and moderate stenosis of the LAD. He was seen by Dr. Servando Snare and coronary bypass surgery was recommended. The patient declined and left the hospital [place] on August 9. He was seen in follow-up on August 17 in our office.  A TEE was performed to determine whether he might need mitral repair at the time of bypass surgery.   The patient comes in today back to his baseline. He is alone. He denies any chest pain or pressure. He denies shortness of breath. He states that he feels well. He does not want to have bypass surgery and he is comfortable with this decision. He asks about possibility of stenting. He states that he has been taking his medications.   Past Medical History  Diagnosis Date  . CAD (coronary artery disease)     a. Overlapping BMS-prox LCx in 2009 b. NSTEMI 2012 due to prox LCx ISR s/p PTCA alone; EF 45% w/ basal inf HK. Had run out of  Plavix 1 week prior. c. NSTEMI 05/2015 after running out of Plavix - cath with 3V CAD, Patient left AMA prior to agreeing to CABG.  [name] HLD (hyperlipidemia)   . Seizure disorder   . Hypertension   . TOBACCO ABUSE 02/22/2009  . Clotting disorder   . Seizures   . Chronic systolic CHF (congestive heart failure)     a. Last EF assessment 40-45% by echo, 45-50% by cath 05/2015.  . MI (myocardial infarction) x 7  . Poor compliance   . Valvular heart disease     a. Echo 05/2015: mild AI, mod MR.  [name] Hyperglycemia A1C 6.0 05/2015.    Past Surgical History  Procedure Laterality Date  . Coronary angioplasty with stent placement  10/2007    BMS-prox LCx  . Coronary angioplasty  03/2011    PTCA-prox LCx ISR  . Left heart catheterization with coronary angiogram [name] 10/13/2013    Procedure: LEFT HEART CATHETERIZATION WITH CORONARY ANGIOGRAM;  Surgeon: Blane Ohara, MD;  Location: [facility] CATH LAB;  Service: Cardiovascular;  Laterality: [name];  . Cardiac catheterization [name] 05/29/2015    Procedure: Left Heart Cath and Coronary Angiography;  Surgeon: Belva Crome, MD;  Location: [facility] CV LAB;  Service: Cardiovascular;  Laterality: [name];  . Tee without cardioversion [name] 06/16/2015    Procedure: TRANSESOPHAGEAL ECHOCARDIOGRAM (TEE);  Surgeon: Josue Hector, MD;  Location: [facility] ENDOSCOPY;  Service: Cardiovascular;  Laterality: [name];    Current Outpatient Prescriptions  Medication Sig Dispense Refill  . acetaminophen (  TYLENOL) 500 MG tablet Take 1,000 mg by mouth every 6 (six) hours as needed for mild pain, moderate pain or headache.    [name] aspirin EC 81 MG EC tablet Take 1 tablet (81 mg total) by mouth daily. 30 tablet 1  . atorvastatin (LIPITOR) 20 MG tablet Take 20 mg by mouth daily.    . carvedilol (COREG) 6.25 MG tablet Take 1 tablet (6.25 mg total) by mouth 2 (two) times daily with a meal. 60 tablet 6  . clopidogrel (PLAVIX) 75 MG tablet Take 1 tablet (75 mg total) by mouth daily. 30 tablet 9  . isosorbide  mononitrate (IMDUR) 30 MG 24 hr tablet Take 0.5 tablets (15 mg total) by mouth daily. 30 tablet 5  . levETIRAcetam (KEPPRA XR) 500 MG 24 hr tablet Take 1,000 mg by mouth daily.     . nitroGLYCERIN (NITROSTAT) 0.4 MG SL tablet Place 0.4 mg under the tongue every 5 (five) minutes as needed for chest pain (3 dose MAX).    [name] OVER THE COUNTER MEDICATION Take 5 mLs by mouth daily. APPLE CIDER VINAGER    . PHENobarbital (LUMINAL) 32.4 MG tablet Take 32.4 mg by mouth 4 (four) times daily.     [name] POTASSIUM PO Take 1 tablet by mouth daily at 12 noon.     No current facility-administered medications for this visit.   Allergies:   Food allergy formula   Social History:  The patient  reports that he has been smoking Cigars.  He has never used smokeless tobacco. He reports that he drinks alcohol. He reports that he does not use illicit drugs.   Family History:  The patient's  family history includes Coronary artery disease in his maternal aunt; Heart attack in his maternal uncle; Heart disease in his maternal uncle; Hypertension in an other family member; Stroke in his maternal uncle.   ROS:  Please see the history of present illness.  Otherwise, review of systems is positive for  headaches.  All other systems are reviewed and negative.   PHYSICAL EXAM: VS:  BP 120/80 mmHg  Pulse 85  Ht 6\' 2"  (1.88 m)  Wt 195 lb 12.8 oz (88.814 kg)  BMI 25.13 kg/m2 , BMI Body mass index is 25.13 kg/(m^2). GEN: Well nourished, well developed, in no acute distress HEENT:  Poor dentition, otherwise normal Neck: no JVD, no masses. No carotid bruits Cardiac: RRR without murmur or gallop                Respiratory:  clear to auscultation bilaterally, normal work of breathing GI: soft, nontender, nondistended, + BS MS: no deformity or atrophy Ext: no pretibial edema, pedal pulses 2+= bilaterally Skin: warm and dry, no rash Neuro:  Strength and sensation are intact Psych: euthymic mood, full affect  EKG:  EKG is not  ordered today.  Recent Labs: 05/27/2015: B Natriuretic Peptide 43.9; TSH 1.566 05/28/2015: ALT 18 05/30/2015: BUN 8; Creatinine, Ser 0.92; Hemoglobin 12.6*; Platelets 368; Potassium 4.1; Sodium 136   Lipid Panel     Component Value Date/Time   CHOL 162 04/07/2011 0151   TRIG 62 04/07/2011 0151   HDL 36* 04/07/2011 0151   CHOLHDL 4.5 04/07/2011 0151   VLDL 12 04/07/2011 0151   LDLCALC 114* 04/07/2011 0151     Wt Readings from Last 3 Encounters:  07/03/15 195 lb 12.8 oz (88.814 kg)  06/07/15 188 lb (85.276 kg)  05/30/15 188 lb 1.6 oz (85.322 kg)     Cardiac Studies Reviewed: 2D  Echo 05/28/2015: Study Conclusions  - Left ventricle: The cavity size was moderately dilated. Systolic function was mildly to moderately reduced. The estimated ejection fraction was in the range of 40% to 45%. There is akinesis of the entireinferolateral myocardium. Features are consistent with a pseudonormal left ventricular filling pattern, with concomitant abnormal relaxation and increased filling pressure (grade 2 diastolic dysfunction). - Aortic valve: There was mild regurgitation. - Mitral valve: There was moderate regurgitation.  TEE 06/16/2015: Study Conclusions  - Impressions: Mild LVE EF 45-50% mid and basal inferior wall hypokinesis Moderate ischemic MR: may benefit from annuloplasty repair during CABG Normal AV Normal PV Mild TR No LAA thrombus No ASD/PFO No aortic debris No pericardial effusion  ASSESSMENT AND PLAN: 1.   CAD, native vessel, with recent non-ST elevation infarction: the patient has stabilized and currently has no symptoms of angina. Will increase his carvedilol to 6.25 mg twice daily. He has decided against surgery. I personally reviewed his cardiac catheterization films. I am not inclined to proceed with PCI as he is not currently symptomatic and he would only be partially revascularized with PCI.  His ramus intermedius could be approached  , and I think his LAD lesion is only moderate. If he has recurrent symptoms suggestive of ischemia, would likely proceed with a pressure wire directed approach. I do feel coronary bypass surgery would be his best option , but his social circumstances are very difficult and he has decided against surgery.  2. Hypertension: blood pressure is controlled. Will increase carvedilol to try to reduce ischemic burden.  3. Chronic systolic heart failure with mitral regurgitation: continue clinical follow-up. TEE reviewed and MR is moderate in severity. Blood pressure has been too low for addition of an ACE or ARB.   4. Medical noncompliance: stressed the importance of regular medical follow-up.  Current medicines are reviewed with the patient today.  The patient does not have concerns regarding medicines.  Labs/ tests ordered today include:  No orders of the defined types were placed in this encounter.   Disposition:   FU 3 months with APP  Signed, Sherren Mocha, MD  07/04/2015 11:50 AM    [facility] [place], [place], [place]  [address] Phone: [phone]; Fax: [phone]

## 2015-07-03 NOTE — Patient Instructions (Signed)
Medication Instructions:  Your physician has recommended you make the following change in your medication:  1. INCREASE Carvedilol to 6.25mg  take one by mouth two times per day  Labwork: No new orders.   Testing/Procedures: No new orders.   Follow-Up: Your physician recommends that you schedule a follow-up appointment in: 3 MONTHS with [place] physician wants you to follow-up in: 6 MONTHS with Dr Burt Knack. You will receive a reminder letter in the mail two months in advance. If you don't receive a letter, please call our office to schedule the follow-up appointment.   Any Other Special Instructions Will Be Listed Below (If Applicable).

## 2015-07-10 ENCOUNTER — Ambulatory Visit: Payer: Medicaid Other | Admitting: Cardiovascular Disease

## 2015-10-04 ENCOUNTER — Encounter: Payer: Self-pay | Admitting: Nurse Practitioner

## 2015-10-04 ENCOUNTER — Ambulatory Visit (INDEPENDENT_AMBULATORY_CARE_PROVIDER_SITE_OTHER): Payer: Medicare Other | Admitting: Nurse Practitioner

## 2015-10-04 VITALS — BP 130/80 | HR 83 | Ht 74.0 in | Wt 218.0 lb

## 2015-10-04 DIAGNOSIS — Z9119 Patient's noncompliance with other medical treatment and regimen: Secondary | ICD-10-CM

## 2015-10-04 DIAGNOSIS — I10 Essential (primary) hypertension: Secondary | ICD-10-CM

## 2015-10-04 DIAGNOSIS — I214 Non-ST elevation (NSTEMI) myocardial infarction: Secondary | ICD-10-CM | POA: Diagnosis not present

## 2015-10-04 DIAGNOSIS — I340 Nonrheumatic mitral (valve) insufficiency: Secondary | ICD-10-CM

## 2015-10-04 DIAGNOSIS — F17200 Nicotine dependence, unspecified, uncomplicated: Secondary | ICD-10-CM | POA: Diagnosis not present

## 2015-10-04 DIAGNOSIS — I25118 Atherosclerotic heart disease of native coronary artery with other forms of angina pectoris: Secondary | ICD-10-CM | POA: Diagnosis not present

## 2015-10-04 DIAGNOSIS — Z91199 Patient's noncompliance with other medical treatment and regimen due to unspecified reason: Secondary | ICD-10-CM

## 2015-10-04 DIAGNOSIS — I25110 Atherosclerotic heart disease of native coronary artery with unstable angina pectoris: Secondary | ICD-10-CM

## 2015-10-04 MED ORDER — ISOSORBIDE MONONITRATE ER 30 MG PO TB24: 15.0000 mg | ORAL_TABLET | Freq: Every day | ORAL | Status: DC

## 2015-10-04 MED ORDER — CARVEDILOL 6.25 MG PO TABS: 6.2500 mg | ORAL_TABLET | Freq: Two times a day (BID) | ORAL | Status: DC

## 2015-10-04 MED ORDER — CLOPIDOGREL BISULFATE 75 MG PO TABS: 75.0000 mg | ORAL_TABLET | Freq: Every day | ORAL | Status: DC

## 2015-10-04 NOTE — Patient Instructions (Addendum)
We will be checking the following labs today - NONE   Medication Instructions:    Continue with your current medicines. I have sent your refills in today.     Testing/Procedures To Be Arranged:  [name]  Follow-Up:   See Dr. Burt Knack in 3 months    Other Special Instructions:   [name]    If you need a refill on your cardiac medications before your next appointment, please call your pharmacy.   Call the [place] office at [phone] if you have any questions, problems or concerns.

## 2015-10-04 NOTE — Progress Notes (Signed)
CARDIOLOGY OFFICE NOTE  Date:  10/04/2015    [name] Date of Birth: 11-Apr-1961 Medical Record [id number]  PCP:  Elizabeth Palau, MD  Cardiologist:  Burt Knack    Chief Complaint  Patient presents with  . Coronary Artery Disease    Follow up visit - seen for Dr. Burt Knack  . Chest Pain    History of Present Illness: [name] is a 54 y.o. male who presents today for a 3 month check. Seen for Dr. Burt Knack.   He has extensive CAD and has undergone previous stenting to the left circumflex. He has required balloon angioplasty to treat in-stent restenosis. He has a long history of medical noncompliance because of limited resources. Other problems include seizure disorder, prediabetes, valvular heart disease with mild aortic insufficiency and moderate mitral regurgitation, and poor social support. He has been homeless at times. The patient was hospitalized back in August with chest pain and non-ST elevation infarction. He was found to have three-vessel coronary disease with total occlusion of the right coronary artery, total occlusion left circumflex, moderate stenosis of the left mainstem, and moderate stenosis of the LAD. He was seen by Dr. Servando Snare and coronary bypass surgery was recommended. The patient declined and left the hospital [place] on August 9. He was seen in follow-up on August 17 in our office. A TEE was performed to determine whether he might need mitral repair at the time of bypass surgery.   Last seen by Dr. Burt Knack back in September.  He did not want to have bypass surgery and he was comfortable with this decision.   Comes back today. Here alone. Very jovial. Says he "pees too much" and "getting too fat". Weight is up. He says he is not short of breath. No chest pain. Told Danielle that he had a grabbing sensation while driving last week - took a NTG after pulling over. Told me he had had no chest pain. Was in a house last week that had just been  fumigated and thought it made him cough. He tells me he is ok. Weight is up. His eating "out of control". No real exercise noted. He tells me he is never having heart surgery and is at peace with just dying.   Past Medical History  Diagnosis Date  . CAD (coronary artery disease)     a. Overlapping BMS-prox LCx in 2009 b. NSTEMI 2012 due to prox LCx ISR s/p PTCA alone; EF 45% w/ basal inf HK. Had run out of Plavix 1 week prior. c. NSTEMI 05/2015 after running out of Plavix - cath with 3V CAD, Patient left AMA prior to agreeing to CABG.  [name] HLD (hyperlipidemia)   . Seizure disorder ([place])   . Hypertension   . TOBACCO ABUSE 02/22/2009  . Clotting disorder ([place])   . Seizures ([place])   . Chronic systolic CHF (congestive heart failure) ([place])     a. Last EF assessment 40-45% by echo, 45-50% by cath 05/2015.  . MI (myocardial infarction) ([place]) x 7  . Poor compliance   . Valvular heart disease     a. Echo 05/2015: mild AI, mod MR.  [name] Hyperglycemia A1C 6.0 05/2015.    Past Surgical History  Procedure Laterality Date  . Coronary angioplasty with stent placement  10/2007    BMS-prox LCx  . Coronary angioplasty  03/2011    PTCA-prox LCx ISR  . Left heart catheterization with coronary angiogram [name] 10/13/2013    Procedure: LEFT  HEART CATHETERIZATION WITH CORONARY ANGIOGRAM;  Surgeon: Blane Ohara, MD;  Location: [facility] CATH LAB;  Service: Cardiovascular;  Laterality: [name];  . Cardiac catheterization [name] 05/29/2015    Procedure: Left Heart Cath and Coronary Angiography;  Surgeon: Belva Crome, MD;  Location: [facility] CV LAB;  Service: Cardiovascular;  Laterality: [name];  . Tee without cardioversion [name] 06/16/2015    Procedure: TRANSESOPHAGEAL ECHOCARDIOGRAM (TEE);  Surgeon: Josue Hector, MD;  Location: [facility] ENDOSCOPY;  Service: Cardiovascular;  Laterality: [name];     Medications: Current Outpatient Prescriptions  Medication Sig Dispense Refill  . acetaminophen (TYLENOL) 500 MG tablet Take 1,000 mg by mouth  every 6 (six) hours as needed for mild pain, moderate pain or headache.    [name] aspirin EC 81 MG EC tablet Take 1 tablet (81 mg total) by mouth daily. 30 tablet 1  . atorvastatin (LIPITOR) 20 MG tablet Take 20 mg by mouth daily.    . carvedilol (COREG) 6.25 MG tablet Take 1 tablet (6.25 mg total) by mouth 2 (two) times daily with a meal. 60 tablet 6  . clopidogrel (PLAVIX) 75 MG tablet Take 1 tablet (75 mg total) by mouth daily. 30 tablet 9  . isosorbide mononitrate (IMDUR) 30 MG 24 hr tablet Take 0.5 tablets (15 mg total) by mouth daily. 30 tablet 11  . levETIRAcetam (KEPPRA XR) 500 MG 24 hr tablet Take 1,000 mg by mouth daily.     . nitroGLYCERIN (NITROSTAT) 0.4 MG SL tablet Place 0.4 mg under the tongue every 5 (five) minutes as needed for chest pain (3 dose MAX).    [name] OVER THE COUNTER MEDICATION Take 5 mLs by mouth daily. APPLE CIDER VINAGER    . PHENobarbital (LUMINAL) 32.4 MG tablet Take 32.4 mg by mouth 4 (four) times daily.     [name] POTASSIUM PO Take 1 tablet by mouth daily at 12 noon.     No current facility-administered medications for this visit.    Allergies: Allergies  Allergen Reactions  . Food Allergy Formula Other (See Comments)    [place] >>> SEIZURES    Social History: The patient  reports that he has been smoking Cigars.  He has never used smokeless tobacco. He reports that he drinks alcohol. He reports that he does not use illicit drugs.   Family History: The patient's family history includes Coronary artery disease in his maternal aunt; Heart attack in his maternal uncle; Heart disease in his maternal uncle; Stroke in his maternal uncle.   Review of Systems: Please see the history of present illness.   Otherwise, the review of systems is positive for none.   All other systems are reviewed and negative.   Physical Exam: VS:  BP 130/80 mmHg  Pulse 83  Ht 6\' 2"  (1.88 m)  Wt 218 lb (98.884 kg)  BMI 27.98 kg/m2  SpO2 100% .  BMI Body mass index is 27.98 kg/(m^2).  Wt  Readings from Last 3 Encounters:  10/04/15 218 lb (98.884 kg)  07/03/15 195 lb 12.8 oz (88.814 kg)  06/07/15 188 lb (85.276 kg)    General: Alert. He has inappropriate laughing but he is in no acute distress.  HEENT: Normal. Neck: Supple, no JVD, carotid bruits, or masses noted.  Cardiac: Regular rate and rhythm.  No edema.  Respiratory:  Lungs are fairly clear to auscultation bilaterally with normal work of breathing.  GI: Soft and nontender.  MS: No deformity or atrophy. Gait and ROM intact. Skin: Warm and dry. Color is  normal.  Neuro:  Strength and sensation are intact and no gross focal deficits noted.  Psych: Alert, appropriate and with normal affect.   LABORATORY DATA:  EKG:  EKG is ordered today. This shows NSR with nonspecific ST changes.   Lab Results  Component Value Date   WBC 6.4 05/30/2015   HGB 12.6* 05/30/2015   HCT 37.5* 05/30/2015   PLT 368 05/30/2015   GLUCOSE 92 05/30/2015   CHOL 162 04/07/2011   TRIG 62 04/07/2011   HDL 36* 04/07/2011   LDLCALC 114* 04/07/2011   ALT 18 06/09/15   AST 17 06/09/15   NA 136 05/30/2015   K 4.1 05/30/2015   CL 103 05/30/2015   CREATININE 0.92 05/30/2015   BUN 8 05/30/2015   CO2 25 05/30/2015   TSH 1.566 05/27/2015   INR 1.01 05/27/2015   HGBA1C 6.0* 05/30/2015    BNP (last 3 results)  Recent Labs  05/27/15 0303  BNP 43.9    ProBNP (last 3 results) No results for input(s): PROBNP in the last 8760 hours.   Other Studies Reviewed Today:  2D Echo 06/09/15: Study Conclusions  - Left ventricle: The cavity size was moderately dilated. Systolic function was mildly to moderately reduced. The estimated ejection fraction was in the range of 40% to 45%. There is akinesis of the entireinferolateral myocardium. Features are consistent with a pseudonormal left ventricular filling pattern, with concomitant abnormal relaxation and increased filling pressure (grade 2 diastolic dysfunction). - Aortic  valve: There was mild regurgitation. - Mitral valve: There was moderate regurgitation.  TEE 06/16/2015: Study Conclusions  - Impressions: Mild LVE EF 45-50% mid and basal inferior wall hypokinesis Moderate ischemic MR: may benefit from annuloplasty repair during CABG Normal AV Normal PV Mild TR No LAA thrombus No ASD/PFO No aortic debris No pericardial effusion  ASSESSMENT AND PLAN: 1. CAD, native vessel, with recent non-ST elevation infarction: the patient has stabilized and currently has no symptoms of angina. He remains on beta blocker and aspirin/Plavix.  He confirms his decision against surgery.   2. Hypertension: blood pressure is controlled. Will continue with his current regimen.   3. Chronic systolic heart failure with mitral regurgitation: fairly well compensated on exam with no reports of symptoms. I suspect his weight gain is from excess calories and inactivity.   4. Medical noncompliance: stressed the importance of regular medical follow-up.  I think his overall prognosis is tenuous at best. I will have him see Dr. Burt Knack in follow up. I do not think I have anything to add.   Current medicines are reviewed with the patient today.  The patient does not have concerns regarding medicines other than what has been noted above.  The following changes have been made:  See above.  Labs/ tests ordered today include:   No orders of the defined types were placed in this encounter.     Disposition:   FU with Dr. Burt Knack in 3 months.   Patient is agreeable to this plan and will call if any problems develop in the interim.   Signed: Burtis Junes, RN, ANP-C 10/04/2015 2:56 PM  [facility] [address] [place],   [address] Phone: [phone] Fax: [phone]

## 2015-10-06 NOTE — Addendum Note (Signed)
Addended by: Freada Bergeron on: 10/06/2015 04:40 PM   Modules accepted: Orders

## 2015-10-27 ENCOUNTER — Other Ambulatory Visit: Payer: Self-pay | Admitting: Neurology

## 2015-10-27 MED ORDER — PHENOBARBITAL 32.4 MG PO TABS: 32.4000 mg | ORAL_TABLET | Freq: Four times a day (QID) | ORAL | Status: DC

## 2015-10-27 MED ORDER — LEVETIRACETAM ER 500 MG PO TB24: 1000.0000 mg | ORAL_TABLET | Freq: Every day | ORAL | Status: DC

## 2015-10-30 ENCOUNTER — Telehealth: Payer: Self-pay | Admitting: Neurology

## 2015-10-30 MED ORDER — LEVETIRACETAM 500 MG PO TABS: 500.0000 mg | ORAL_TABLET | Freq: Two times a day (BID) | ORAL | Status: DC

## 2015-10-30 NOTE — Telephone Encounter (Signed)
The patient called. He did not get the Keppra or the phenobarbital RX called in last week, I have phoned in the RX. Switched to regular Keppra from XR, as this requires preauthorization. The patient needs a RV, will get one set up.

## 2015-10-31 ENCOUNTER — Telehealth: Payer: Self-pay | Admitting: Neurology

## 2015-10-31 NOTE — Telephone Encounter (Signed)
Had a message from the answering service that there is a hold on pt's meds.  Looked in chart and did not see where pt was seen recently, but there was a note from Dr. Jannifer Franklin stating he had called in refills for Keppra and Phenobarital.  Called the pt to make sure he had everything he needed but had to LVM.  Called the pharmacy on file as well but no answer.

## 2015-10-31 NOTE — Telephone Encounter (Signed)
Rn call [facility] at [place] about patients phenobarbital. Rn was calling to get fax number for the phenobarbital prescription. Jacquelynn Cree stated Dr. Jannifer Franklin did a verbal order for the medication today. Rn explain the office was closed on Monday. Pt did pick up keppra and phenobarbital prescription. Pt has a follow up with [name]., Pt was last seen in 2015 by Dr.Sethi.

## 2015-10-31 NOTE — Telephone Encounter (Signed)
I called the patient. Appointment scheduled 11/14/15 with Jinny Blossom, NP. I offered several earlier dates but patient was not able to make any of them.

## 2015-11-14 ENCOUNTER — Encounter: Payer: Self-pay | Admitting: Adult Health

## 2015-11-14 ENCOUNTER — Ambulatory Visit (INDEPENDENT_AMBULATORY_CARE_PROVIDER_SITE_OTHER): Payer: Medicare Other | Admitting: Adult Health

## 2015-11-14 VITALS — BP 124/82 | HR 88 | Ht 74.0 in | Wt 216.5 lb

## 2015-11-14 DIAGNOSIS — Z5181 Encounter for therapeutic drug level monitoring: Secondary | ICD-10-CM

## 2015-11-14 DIAGNOSIS — R569 Unspecified convulsions: Secondary | ICD-10-CM | POA: Diagnosis not present
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
## 2015-11-14 NOTE — Patient Instructions (Signed)
Continue Keppra and Phenobarbital  If you have a seizure please let us know Blood work today If your symptoms worsen or you develop new symptoms please let us know.

## 2015-11-15 LAB — CBC WITH DIFFERENTIAL/PLATELET
BASOS ABS: 0 10*3/uL (ref 0.0–0.2)
Basos: 1 %
EOS (ABSOLUTE): 0.2 10*3/uL (ref 0.0–0.4)
Eos: 3 %
HEMOGLOBIN: 11.8 g/dL — AB (ref 12.6–17.7)
Hematocrit: 34.8 % — ABNORMAL LOW (ref 37.5–51.0)
Immature Grans (Abs): 0 10*3/uL (ref 0.0–0.1)
Immature Granulocytes: 0 %
LYMPHS ABS: 1.8 10*3/uL (ref 0.7–3.1)
Lymphs: 26 %
MCH: 29.1 pg (ref 26.6–33.0)
MCHC: 33.9 g/dL (ref 31.5–35.7)
MCV: 86 fL (ref 79–97)
MONOCYTES: 14 %
MONOS ABS: 1 10*3/uL — AB (ref 0.1–0.9)
NEUTROS ABS: 3.8 10*3/uL (ref 1.4–7.0)
Neutrophils: 56 %
PLATELETS: 432 10*3/uL — AB (ref 150–379)
RBC: 4.06 x10E6/uL — AB (ref 4.14–5.80)
RDW: 16.3 % — AB (ref 12.3–15.4)
WBC: 6.8 10*3/uL (ref 3.4–10.8)

## 2015-11-15 LAB — COMPREHENSIVE METABOLIC PANEL
A/G RATIO: 1.2 (ref 1.1–2.5)
ALBUMIN: 4.4 g/dL (ref 3.5–5.5)
ALK PHOS: 91 IU/L (ref 39–117)
ALT: 12 IU/L (ref 0–44)
AST: 13 IU/L (ref 0–40)
BUN / CREAT RATIO: 9 (ref 9–20)
BUN: 10 mg/dL (ref 6–24)
CHLORIDE: 100 mmol/L (ref 96–106)
CO2: 21 mmol/L (ref 18–29)
Calcium: 9.6 mg/dL (ref 8.7–10.2)
Creatinine, Ser: 1.06 mg/dL (ref 0.76–1.27)
GFR calc non Af Amer: 80 mL/min/{1.73_m2} (ref >59)
GFR, EST AFRICAN AMERICAN: 92 mL/min/{1.73_m2} (ref >59)
GLUCOSE: 88 mg/dL (ref 65–99)
Globulin, Total: 3.6 g/dL (ref 1.5–4.5)
POTASSIUM: 4.6 mmol/L (ref 3.5–5.2)
SODIUM: 139 mmol/L (ref 134–144)
TOTAL PROTEIN: 8 g/dL (ref 6.0–8.5)

## 2015-11-15 LAB — PHENOBARBITAL LEVEL: PHENOBARBITAL, SERUM: 16 ug/mL (ref 15–40)

## 2015-11-16 ENCOUNTER — Telehealth: Payer: Self-pay

## 2015-11-16 NOTE — Telephone Encounter (Signed)
-----   Message from Ward Givens, NP sent at 11/15/2015 12:24 PM EST ----- Blood work ok. Hgb, hct and rbc slighlty low- consistent with previous lab work. Please call patient. Forward to PCP

## 2015-11-16 NOTE — Telephone Encounter (Signed)
Called patient. Gave lab results. Patient verbalized understanding. Patient states his new PCP will be at [facility]. Patient does not know who his new PCP will be there. Faxed lab results to [phone].

## 2015-11-20 DIAGNOSIS — I252 Old myocardial infarction: Secondary | ICD-10-CM | POA: Diagnosis not present

## 2015-11-20 DIAGNOSIS — G40311 Generalized idiopathic epilepsy and epileptic syndromes, intractable, with status epilepticus: Secondary | ICD-10-CM | POA: Diagnosis not present

## 2015-11-20 DIAGNOSIS — E139 Other specified diabetes mellitus without complications: Secondary | ICD-10-CM | POA: Diagnosis not present

## 2015-11-20 DIAGNOSIS — I10 Essential (primary) hypertension: Secondary | ICD-10-CM | POA: Diagnosis not present

## 2015-11-20 DIAGNOSIS — E7800 Pure hypercholesterolemia, unspecified: Secondary | ICD-10-CM | POA: Diagnosis not present

## 2015-11-20 DIAGNOSIS — G40309 Generalized idiopathic epilepsy and epileptic syndromes, not intractable, without status epilepticus: Secondary | ICD-10-CM | POA: Diagnosis not present

## 2016-01-05 ENCOUNTER — Ambulatory Visit: Payer: Medicare Other | Admitting: Cardiovascular Disease

## 2016-01-08 ENCOUNTER — Encounter: Payer: Self-pay | Admitting: Cardiovascular Disease

## 2016-01-21 DIAGNOSIS — R079 Chest pain, unspecified: Secondary | ICD-10-CM | POA: Diagnosis not present

## 2016-02-05 ENCOUNTER — Ambulatory Visit (INDEPENDENT_AMBULATORY_CARE_PROVIDER_SITE_OTHER): Payer: Medicare Other | Admitting: Cardiology

## 2016-02-05 ENCOUNTER — Encounter: Payer: Self-pay | Admitting: Cardiology

## 2016-02-05 VITALS — BP 140/60 | HR 78 | Ht 74.0 in | Wt 205.0 lb

## 2016-02-05 DIAGNOSIS — I214 Non-ST elevation (NSTEMI) myocardial infarction: Secondary | ICD-10-CM

## 2016-02-05 DIAGNOSIS — I10 Essential (primary) hypertension: Secondary | ICD-10-CM

## 2016-02-05 DIAGNOSIS — I25118 Atherosclerotic heart disease of native coronary artery with other forms of angina pectoris: Secondary | ICD-10-CM

## 2016-02-05 DIAGNOSIS — Z9119 Patient's noncompliance with other medical treatment and regimen: Secondary | ICD-10-CM

## 2016-02-05 DIAGNOSIS — I340 Nonrheumatic mitral (valve) insufficiency: Secondary | ICD-10-CM

## 2016-02-05 DIAGNOSIS — Z91199 Patient's noncompliance with other medical treatment and regimen due to unspecified reason: Secondary | ICD-10-CM

## 2016-02-05 MED ORDER — CARVEDILOL 12.5 MG PO TABS: 12.5000 mg | ORAL_TABLET | Freq: Two times a day (BID) | ORAL | Status: DC

## 2016-02-05 MED ORDER — NITROGLYCERIN 0.4 MG SL SUBL: 0.4000 mg | SUBLINGUAL_TABLET | SUBLINGUAL | Status: DC | PRN

## 2016-02-05 NOTE — Addendum Note (Signed)
Addended by: Claude Manges on: 02/05/2016 02:19 PM   Modules accepted: Orders

## 2016-02-05 NOTE — Progress Notes (Signed)
02/05/2016 [name]   1961-09-20  [id number]  Primary Physician No primary care provider on file. Primary Cardiologist: Dr. Burt Knack   Reason for Visit/CC: 3 Month F/u for CAD.   HPI: The patient is a 55 year old male, followed by Dr. Burt Knack, who presents to clinic for 3 month follow-up. He has extensive CAD and has undergone previous stenting to the left circumflex. He has required balloon angioplasty to treat in-stent restenosis. He has a long history of medical noncompliance because of limited resources. Other problems include seizure disorder, prediabetes, valvular heart disease with mild aortic insufficiency and moderate mitral regurgitation, and poor social support. He has been homeless at times.   He was  Hospitalized August 2016 with chest pain and and non-ST elevation infarction. He was found to have three-vessel coronary disease with total occlusion of the right coronary artery, total occlusion left circumflex, moderate stenosis of the left mainstem, and moderate stenosis of the LAD. He was seen by Dr. Servando Snare and coronary bypass surgery was recommended. The patient declined and left the hospital [place] on August 9. He was seen in follow-up on August 17 in our office. A TEE was performed to determine whether he might need mitral repair at the time of bypass surgery. MR was noted to be moderate in severity.   He was seen in f/u by Dr. Burt Knack 07/03/15. He personally reviewed his cardiac catheterization films. At that time, Dr. Burt Knack noted that he was not inclined to proceed with PCI as the patient was currently asymptomatic and would only be partially revascularized with PCI. It was felt that his ramus intermedius could be approached. His LAD lesion is felt to be moderate. Dr. Burt Knack outlined in his note that if he were to have recurrent symptoms suggestive of ischemia, he will likely proceed with a pressure wire directed approach. Dr. Burt Knack did mention that he did feel  that CABG surgery would be his best option however given his social circumstances he was understanding of the patient's decision to decide against surgery. He has thus been managed medically. He is on aspirin, Plavix, a statin, beta blocker as well as long acting nitrate. In the past it has been well outlined in his notes that his blood pressure previously had been too low for the addition of an Ace or ARB.  He presents back to clinic today for 3 month follow-up. He reports that he has done well. He denies any CP or dyspnea. No LEE. He reports full medication compliance. He has not had to use any SL NTG. He reports that he quit smoking just yesterday.    Current Outpatient Prescriptions  Medication Sig Dispense Refill  . acetaminophen (TYLENOL) 500 MG tablet Take 1,000 mg by mouth every 6 (six) hours as needed for mild pain, moderate pain or headache.    [name] aspirin EC 81 MG EC tablet Take 1 tablet (81 mg total) by mouth daily. 30 tablet 1  . atorvastatin (LIPITOR) 20 MG tablet Take 20 mg by mouth daily.    . carvedilol (COREG) 12.5 MG tablet Take 1 tablet (12.5 mg total) by mouth 2 (two) times daily with a meal. 60 tablet 6  . clopidogrel (PLAVIX) 75 MG tablet Take 1 tablet (75 mg total) by mouth daily. 30 tablet 9  . isosorbide mononitrate (IMDUR) 30 MG 24 hr tablet Take 0.5 tablets (15 mg total) by mouth daily. 30 tablet 11  . levETIRAcetam (KEPPRA) 500 MG tablet Take 1 tablet (500 mg total) by  mouth 2 (two) times daily. 60 tablet 3  . nitroGLYCERIN (NITROSTAT) 0.4 MG SL tablet Place 0.4 mg under the tongue every 5 (five) minutes as needed for chest pain (3 dose MAX).    [name] OVER THE COUNTER MEDICATION Take 5 mLs by mouth daily. APPLE CIDER VINAGER    . PHENobarbital (LUMINAL) 32.4 MG tablet Take 1 tablet (32.4 mg total) by mouth 4 (four) times daily. 120 tablet 3  . POTASSIUM PO Take 1 tablet by mouth daily at 12 noon.     No current facility-administered medications for this visit.     Allergies  Allergen Reactions  . Food Allergy Formula Other (See Comments)    Sage >>> SEIZURES    Social History   Social History  . Marital Status: Single    Spouse Name: [name]  . Number of Children: 0  . Years of Education: 12th   Occupational History  . [name]    Social History Main Topics  . Smoking status: Light Tobacco Smoker -- 1.00 packs/day for 35 years    Types: Cigars  . Smokeless tobacco: Never Used  . Alcohol Use: 0.0 oz/week  . Drug Use: No  . Sexual Activity: Not Currently   Other Topics Concern  . Not on file   Social History Narrative   Patient is homeless            Review of Systems: General: negative for chills, fever, night sweats or weight changes.  Cardiovascular: negative for chest pain, dyspnea on exertion, edema, orthopnea, palpitations, paroxysmal nocturnal dyspnea or shortness of breath Dermatological: negative for rash Respiratory: negative for cough or wheezing Urologic: negative for hematuria Abdominal: negative for nausea, vomiting, diarrhea, bright red blood per rectum, melena, or hematemesis Neurologic: negative for visual changes, syncope, or dizziness All other systems reviewed and are otherwise negative except as noted above.    Blood pressure 140/60, pulse 78, height 6\' 2"  (1.88 m), weight 205 lb (92.987 kg), SpO2 96 %.  General appearance: alert, cooperative and no distress, no teeth nor dentures.  Neck: no carotid bruit and no JVD Lungs: clear to auscultation bilaterally Heart: regular rate and rhythm, S1, S2 normal, no murmur, click, rub or gallop Extremities: no LEE Pulses: 2+ and symmetric Skin: warm and dry Neurologic: Grossly normal  EKG not performed  ASSESSMENT AND PLAN:   1. CAD: patient continues to decline CABG. He is asymtomtatci on medical therapy. No s/s of CHF. Continue ASA, Plavix, statin, Coreg and Imdur. HR is 78 bpm and BP is 140/60. We will further increase his Coreg to 12.5 mg BID to reduce  ischemic burden.   2. HTN: BP is mildly elevated today. We will increase his Coreg to 12.5 mg BID.   3. Tobacco Abuse: patient reports he just quit smoking yesterday. He was encouraged to continue to refrain for further use.    PLAN  F/u with Dr. Burt Knack in 3 months.   Lyda Jester PA-C 02/05/2016 1:57 PM

## 2016-02-05 NOTE — Patient Instructions (Signed)
Medication Instructions:   START TAKING CARVEDILOL 12.5 TWICE A DAY   If you need a refill on your cardiac medications before your next appointment, please call your pharmacy.  Labwork:  NONE ORDER TODAY   Testing/Procedures:  NONE ORDER TODAY    Follow-Up:  WITH DR Burt Knack IN 3 MONTHS    Any Other Special Instructions Will Be Listed Below (If Applicable).

## 2016-03-07 ENCOUNTER — Other Ambulatory Visit: Payer: Self-pay | Admitting: *Deleted

## 2016-03-07 MED ORDER — LEVETIRACETAM 500 MG PO TABS: 500.0000 mg | ORAL_TABLET | Freq: Two times a day (BID) | ORAL | Status: DC

## 2016-03-07 MED ORDER — PHENOBARBITAL 32.4 MG PO TABS: 32.4000 mg | ORAL_TABLET | Freq: Four times a day (QID) | ORAL | Status: DC

## 2016-03-07 NOTE — Telephone Encounter (Signed)
------------------------------------------------------------   Caller CONFESOR CAPEL               CID [id number]  Patient SAME                 Pt's Dr Jannifer Franklin       Area Code [id number] Phone# [phone] * DOB [phone]     RE NEEDS AN RX REFILL, PLS C/B                                                                            Disp:Y/N N If Y = C/B If No Response In 80minutes ============================================================ Refill for PHENobarbital (LUMINAL) 32.4 MG tablet and levETIRAcetam (KEPPRA) 500 MG tablet

## 2016-03-07 NOTE — Telephone Encounter (Signed)
------------------------------------------------------------   OCEAN STEENSMA               CID [id number]  Patient SAME                 Pt's Dr Jannifer Franklin       Area Code [id number] Phone# [phone]     RE REFILLS ON HIS KEPPRA 500MG  AND PHENOBARBITAL     32.4 CALLED INTO [facility] @ [phone]             Disp:Y/N Y If Y = C/B If No Response In 84minutes ============================================================

## 2016-03-08 NOTE — Telephone Encounter (Signed)
Luminal (phenobarb) rx printed, signed, faxed to pharmacy.

## 2016-04-20 ENCOUNTER — Encounter (HOSPITAL_COMMUNITY): Payer: Self-pay

## 2016-04-20 ENCOUNTER — Emergency Department (HOSPITAL_COMMUNITY)
Admission: EM | Admit: 2016-04-20 | Discharge: 2016-04-20 | Disposition: A | Discharge status: Home / Self Care | Payer: Medicare Other | Attending: Emergency Medicine | Admitting: Emergency Medicine

## 2016-04-20 DIAGNOSIS — F17210 Nicotine dependence, cigarettes, uncomplicated: Secondary | ICD-10-CM | POA: Insufficient documentation

## 2016-04-20 DIAGNOSIS — I5022 Chronic systolic (congestive) heart failure: Secondary | ICD-10-CM | POA: Diagnosis not present

## 2016-04-20 DIAGNOSIS — Z79899 Other long term (current) drug therapy: Secondary | ICD-10-CM | POA: Insufficient documentation

## 2016-04-20 DIAGNOSIS — I110 Hypertensive heart disease with heart failure: Secondary | ICD-10-CM | POA: Diagnosis not present

## 2016-04-20 DIAGNOSIS — R569 Unspecified convulsions: Secondary | ICD-10-CM | POA: Diagnosis not present

## 2016-04-20 DIAGNOSIS — I252 Old myocardial infarction: Secondary | ICD-10-CM | POA: Diagnosis not present

## 2016-04-20 DIAGNOSIS — Z7982 Long term (current) use of aspirin: Secondary | ICD-10-CM | POA: Diagnosis not present

## 2016-04-20 DIAGNOSIS — G40909 Epilepsy, unspecified, not intractable, without status epilepticus: Secondary | ICD-10-CM | POA: Diagnosis not present

## 2016-04-20 DIAGNOSIS — I2510 Atherosclerotic heart disease of native coronary artery without angina pectoris: Secondary | ICD-10-CM | POA: Insufficient documentation

## 2016-04-20 LAB — CBG MONITORING, ED: GLUCOSE-CAPILLARY: 100 mg/dL — AB (ref 65–99)

## 2016-04-20 LAB — PHENOBARBITAL LEVEL: PHENOBARBITAL: 12.4 ug/mL — AB (ref 15.0–30.0)

## 2016-04-20 MED ORDER — LEVETIRACETAM 500 MG PO TABS
500.0000 mg | ORAL_TABLET | Freq: Once | ORAL | Status: AC
  Administered 2016-04-20: 500 mg via ORAL
  Filled 2016-04-20: qty 1

## 2016-04-20 MED ORDER — PHENOBARBITAL 20 MG/5ML PO ELIX
32.5000 mg | ORAL_SOLUTION | Freq: Once | ORAL | Status: DC
  Filled 2016-04-20: qty 10

## 2016-04-20 NOTE — ED Provider Notes (Signed)
CSN: [id number]     Arrival date & time 04/20/16  2005 History   First MD Initiated Contact with Patient 04/20/16 2029     Chief Complaint  Patient presents with  . Seizures     (Consider location/radiation/quality/duration/timing/severity/associated sxs/prior Treatment) Patient is a 55 y.o. male presenting with seizures. The history is provided by the patient.  Seizures Seizure activity on arrival: no   Seizure type:  Grand mal Preceding symptoms comment:  Abnormal smell Initial focality:  None Episode characteristics: abnormal movements, confusion and generalized shaking   Postictal symptoms: confusion and somnolence   Return to baseline: yes   Severity:  Moderate Duration:  2 minutes Timing:  Once Number of seizures this episode:  1 Progression:  Resolved Context: medical non-compliance     Past Medical History  Diagnosis Date  . CAD (coronary artery disease)     a. Overlapping BMS-prox LCx in 2009 b. NSTEMI 2012 due to prox LCx ISR s/p PTCA alone; EF 45% w/ basal inf HK. Had run out of Plavix 1 week prior. c. NSTEMI 05/2015 after running out of Plavix - cath with 3V CAD, Patient left AMA prior to agreeing to CABG.  [name] HLD (hyperlipidemia)   . Seizure disorder ([place])   . Hypertension   . TOBACCO ABUSE 02/22/2009  . Clotting disorder ([place])   . Seizures ([place])   . Chronic systolic CHF (congestive heart failure) ([place])     a. Last EF assessment 40-45% by echo, 45-50% by cath 05/2015.  . MI (myocardial infarction) ([place]) x 7  . Poor compliance   . Valvular heart disease     a. Echo 05/2015: mild AI, mod MR.  [name] Hyperglycemia A1C 6.0 05/2015.   Past Surgical History  Procedure Laterality Date  . Coronary angioplasty with stent placement  10/2007    BMS-prox LCx  . Coronary angioplasty  03/2011    PTCA-prox LCx ISR  . Left heart catheterization with coronary angiogram [name] 10/13/2013    Procedure: LEFT HEART CATHETERIZATION WITH CORONARY ANGIOGRAM;  Surgeon: Blane Ohara, MD;   Location: [facility] CATH LAB;  Service: Cardiovascular;  Laterality: [name];  . Cardiac catheterization [name] 05/29/2015    Procedure: Left Heart Cath and Coronary Angiography;  Surgeon: Belva Crome, MD;  Location: [facility] CV LAB;  Service: Cardiovascular;  Laterality: [name];  . Tee without cardioversion [name] 06/16/2015    Procedure: TRANSESOPHAGEAL ECHOCARDIOGRAM (TEE);  Surgeon: Josue Hector, MD;  Location: [facility] ENDOSCOPY;  Service: Cardiovascular;  Laterality: [name];   Family History  Problem Relation Age of Onset  . Coronary artery disease Maternal Aunt   . Heart disease Maternal Uncle   . Stroke Maternal Uncle   . Heart attack Maternal Uncle     X3  . Hypertension     Social History  Substance Use Topics  . Smoking status: Light Tobacco Smoker -- 1.00 packs/day for 35 years    Types: Cigars  . Smokeless tobacco: Never Used  . Alcohol Use: 0.0 oz/week    Review of Systems  Constitutional: Negative for fever and chills.  HENT: Negative for congestion and sore throat.   Eyes: Negative for pain.  Respiratory: Negative for cough and shortness of breath.   Cardiovascular: Negative for chest pain and palpitations.  Gastrointestinal: Negative for nausea, vomiting, abdominal pain and diarrhea.  Endocrine: Negative.   Genitourinary: Negative for flank pain.  Musculoskeletal: Negative for back pain and neck pain.  Skin: Negative for rash.  Allergic/Immunologic: Negative.   Neurological:  Positive for seizures. Negative for dizziness, syncope and light-headedness.  Psychiatric/Behavioral: Negative for confusion.      Allergies  Food allergy formula  Home Medications   Prior to Admission medications   Medication Sig Start Date End Date Taking? Authorizing Provider  aspirin EC 81 MG EC tablet Take 1 tablet (81 mg total) by mouth daily. 05/29/15  Yes Reyne Dumas, MD  atorvastatin (LIPITOR) 20 MG tablet Take 20 mg by mouth daily.   Yes Historical Provider, MD  carvedilol (COREG) 6.25 MG tablet  Take 6.25 mg by mouth 2 (two) times daily. 03/28/16  Yes Historical Provider, MD  clopidogrel (PLAVIX) 75 MG tablet Take 1 tablet (75 mg total) by mouth daily. 10/04/15  Yes Burtis Junes, NP  isosorbide mononitrate (IMDUR) 30 MG 24 hr tablet Take 0.5 tablets (15 mg total) by mouth daily. 10/04/15  Yes Burtis Junes, NP  levETIRAcetam (KEPPRA) 500 MG tablet Take 1 tablet (500 mg total) by mouth 2 (two) times daily. 03/07/16  Yes Kathrynn Ducking, MD  nitroGLYCERIN (NITROSTAT) 0.4 MG SL tablet Place 1 tablet (0.4 mg total) under the tongue every 5 (five) minutes as needed for chest pain (3 dose MAX). 02/05/16  Yes Brittainy Erie Noe, PA-C  PHENobarbital (LUMINAL) 32.4 MG tablet Take 1 tablet (32.4 mg total) by mouth 4 (four) times daily. 03/07/16  Yes Kathrynn Ducking, MD  POTASSIUM PO Take 1 tablet by mouth daily at 12 noon.   Yes Historical Provider, MD  carvedilol (COREG) 12.5 MG tablet Take 1 tablet (12.5 mg total) by mouth 2 (two) times daily with a meal. Patient not taking: Reported on 04/20/2016 02/05/16   Brittainy M Simmons, PA-C   BP 130/92 mmHg  Pulse 83  Temp(Src) 98.9 F (37.2 C) (Oral)  Resp 18  Ht 6\' 2"  (1.88 m)  Wt 92.987 kg  BMI 26.31 kg/m2  SpO2 98% Physical Exam  Constitutional: He is oriented to person, place, and time. He appears well-developed and well-nourished.  HENT:  Head: Normocephalic and atraumatic.  Eyes: Conjunctivae and EOM are normal. Pupils are equal, round, and reactive to light.  Neck: Normal range of motion. Neck supple.  Cardiovascular: Normal rate, regular rhythm, normal heart sounds and intact distal pulses.   Pulmonary/Chest: Effort normal and breath sounds normal. No respiratory distress.  Abdominal: Soft. Bowel sounds are normal. There is no tenderness.  Musculoskeletal: Normal range of motion.  Neurological: He is alert and oriented to person, place, and time. He has normal reflexes. No cranial nerve deficit.  Normal finger to nose bilaterally.    No pronator drift bilaterally.    Skin: Skin is warm and dry.    ED Course  Procedures (including critical care time) Labs Review Labs Reviewed  PHENOBARBITAL LEVEL - Abnormal; Notable for the following:    Phenobarbital 12.4 (*)    All other components within normal limits  CBG MONITORING, ED - Abnormal; Notable for the following:    Glucose-Capillary 100 (*)    All other components within normal limits    Imaging Review No results found. I have personally reviewed and evaluated these images and lab results as part of my medical decision-making.   EKG Interpretation None      MDM   Final diagnoses:  None    The pt is a 55 yo male with a hx of seizure d/o on keppra and phenobarb presenting for single seizure while at church.  The pt reports a "demon jumped on me."  Reports  this is usual when he has seizures.  No hx of psychiatric abnormalities.   On exam pt HDS in NAD and has returned to baseline.  Normal neuro exam.  Pt reports this is his normal seizure activity with no fevers and low suspicion for meningitis, encephalitis, or traumatic etiology.  Has been non-compliant with his home meds.  Home meds given in the ED.  EKG due to previous cardiac hx with no signs of ischemia or arrhythmogenic potential.  The patient eloped without return precautions or reevaluation.     Geronimo Boot, MD 04/21/16 [place], MD 04/21/16 [phone]

## 2016-04-20 NOTE — ED Notes (Signed)
Pt does not want to wait on doctor for discharge, states he is leaving AMA, understands risks and benefits

## 2016-04-20 NOTE — ED Notes (Signed)
Pt comes via [facility], was at the church working on a Lucianne Lei, forgot to take his seizure medication today and had a witnessed seizure lasting about 2 minutes and was lowered to the ground, post ictal for about ten minutes. AXO x 4, incontinent of bladder.

## 2016-04-20 NOTE — ED Notes (Signed)
Seizure pads applied to pt's bed 

## 2016-05-21 ENCOUNTER — Ambulatory Visit: Payer: Medicare Other | Admitting: Adult Health

## 2016-05-21 ENCOUNTER — Telehealth: Payer: Self-pay | Admitting: Adult Health

## 2016-05-21 NOTE — Telephone Encounter (Signed)
Patient called to confirm today's appointment, advised appointment was this morning at 11:00am with NP, [name]. Patient states he is in our parking lot now. Skyped Gaynell in [place], regarding balance, Gaynell picked up the call.
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
## 2016-05-22 ENCOUNTER — Encounter: Payer: Self-pay | Admitting: Cardiovascular Disease

## 2016-05-22 ENCOUNTER — Encounter: Payer: Self-pay | Admitting: Adult Health

## 2016-05-22 ENCOUNTER — Ambulatory Visit (INDEPENDENT_AMBULATORY_CARE_PROVIDER_SITE_OTHER): Payer: Medicare Other | Admitting: Cardiovascular Disease

## 2016-05-22 VITALS — BP 122/84 | HR 83 | Ht 74.0 in | Wt 214.2 lb

## 2016-05-22 DIAGNOSIS — I10 Essential (primary) hypertension: Secondary | ICD-10-CM

## 2016-05-22 DIAGNOSIS — E785 Hyperlipidemia, unspecified: Secondary | ICD-10-CM

## 2016-05-22 NOTE — Progress Notes (Signed)
Cardiology Office Note Date:  05/22/2016   ID:  [name], DOB Apr 19, 1961, MRN [id number]  PCP:  No primary care provider on file.  Cardiologist:  Sherren Mocha, MD    Chief Complaint  Patient presents with  . Follow-up    CAD    History of Present Illness: [name] is a 55 y.o. male who presents for follow-up evaluation. The patient has extensive CAD and has undergone multiple previous PCI procedures of severe stenosis in the left circumflex. The patient has a history of medical noncompliance. Other medical problems include seizure disorder, prediabetes, and mild valvular heart disease. The patient was hospitalized in 2016 with non-ST elevation infarction. He's found to have three-vessel disease with total occlusion of the RCA and left circumflex, moderate stenosis of the left mainstem, and moderate stenosis of the LAD. He was evaluated by cardiac surgery and ultimately declined CABG. Ongoing medical therapy was recommended. He was last seen in April 2017. At that time his carvedilol was increased to 12.5 mg twice daily. He did not tolerate the increase carvedilol dose. For some reason he associated this with rectal bleeding although I don't think there is any relation. He has back on 6.25 mg twice daily and prefers to stay on this dose.  The patient feels well. Reports compliance with his medications. He is walking without exertional symptoms. He denies chest pain, chest pressure, or shortness of breath. He is using Nicorette lozenges to try to quit smoking. He denies edema, orthopnea, or PND.   Past Medical History:  Diagnosis Date  . CAD (coronary artery disease)    a. Overlapping BMS-prox LCx in 2009 b. NSTEMI 2012 due to prox LCx ISR s/p PTCA alone; EF 45% w/ basal inf HK. Had run out of Plavix 1 week prior. c. NSTEMI 05/2015 after running out of Plavix - cath with 3V CAD, Patient left AMA prior to agreeing to CABG.  . Chronic systolic CHF (congestive heart failure) ([place])      a. Last EF assessment 40-45% by echo, 45-50% by cath 05/2015.  [name] Clotting disorder ([place])   . HLD (hyperlipidemia)   . Hyperglycemia A1C 6.0 05/2015.  [name] Hypertension   . MI (myocardial infarction) ([place]) x 7  . Poor compliance   . Seizure disorder ([place])   . Seizures ([place])   . TOBACCO ABUSE 02/22/2009  . Valvular heart disease    a. Echo 05/2015: mild AI, mod MR.    Past Surgical History:  Procedure Laterality Date  . CARDIAC CATHETERIZATION [name] 05/29/2015   Procedure: Left Heart Cath and Coronary Angiography;  Surgeon: Belva Crome, MD;  Location: [facility];  Service: Cardiovascular;  Laterality: [name];  . CORONARY ANGIOPLASTY  03/2011   PTCA-prox LCx ISR  . CORONARY ANGIOPLASTY WITH STENT PLACEMENT  10/2007   BMS-prox LCx  . LEFT HEART CATHETERIZATION WITH CORONARY ANGIOGRAM [name] 10/13/2013   Procedure: LEFT HEART CATHETERIZATION WITH CORONARY ANGIOGRAM;  Surgeon: Blane Ohara, MD;  Location: [facility] CATH LAB;  Service: Cardiovascular;  Laterality: [name];  . TEE WITHOUT CARDIOVERSION [name] 06/16/2015   Procedure: TRANSESOPHAGEAL ECHOCARDIOGRAM (TEE);  Surgeon: Josue Hector, MD;  Location: [facility] ENDOSCOPY;  Service: Cardiovascular;  Laterality: [name];    Current Outpatient Prescriptions  Medication Sig Dispense Refill  . aspirin EC 81 MG EC tablet Take 1 tablet (81 mg total) by mouth daily. 30 tablet 1  . atorvastatin (LIPITOR) 20 MG tablet Take 20 mg by mouth daily.    [name]  Bilberry, Vaccinium myrtillus, (BILBERRY EXTRACT PO) Take 1 drop by mouth daily.    . carvedilol (COREG) 6.25 MG tablet Take 6.25 mg by mouth 2 (two) times daily with a meal.    . clopidogrel (PLAVIX) 75 MG tablet Take 1 tablet (75 mg total) by mouth daily. 30 tablet 9  . isosorbide mononitrate (IMDUR) 30 MG 24 hr tablet Take 0.5 tablets (15 mg total) by mouth daily. 30 tablet 11  . levETIRAcetam (KEPPRA) 500 MG tablet Take 1 tablet (500 mg total) by mouth 2 (two) times daily. 60 tablet 5  . Misc Natural Products  (GINKOGIN PO) Take 1 drop by mouth daily.    . nitroGLYCERIN (NITROSTAT) 0.4 MG SL tablet Place 1 tablet (0.4 mg total) under the tongue every 5 (five) minutes as needed for chest pain (3 dose MAX). 25 tablet 3  . OVER THE COUNTER MEDICATION MEGA MAN - Take one (1) tablet by mouth daily.    [name] PHENobarbital (LUMINAL) 32.4 MG tablet Take 1 tablet (32.4 mg total) by mouth 4 (four) times daily. 120 tablet 5  . POTASSIUM PO Take 1 tablet by mouth daily at 12 noon.    [name] Rosemary Oil OIL Take 1 drop by mouth daily.     No current facility-administered medications for this visit.     Allergies:   Food allergy formula   Social History:  The patient  reports that he has been smoking Cigars.  He has a 35.00 pack-year smoking history. He has never used smokeless tobacco. He reports that he drinks alcohol. He reports that he does not use drugs.   Family History:  The patient's  family history includes Coronary artery disease in his maternal aunt; Heart attack in his maternal uncle; Heart disease in his maternal uncle; Stroke in his maternal uncle.    ROS:  Please see the history of present illness. All other systems are reviewed and negative.    PHYSICAL EXAM: VS:  BP 122/84   Pulse 83   Ht _0  (1.88 m)   Wt 214 lb 3.2 oz (97.2 kg)   BMI 27.50 kg/m  , BMI Body mass index is 27.5 kg/m. GEN: Well nourished, well developed, in no acute distress  HEENT: normal  Neck: no JVD, no masses. No carotid bruits Cardiac: RRR with soft SEM at the LSB              Respiratory:  clear to auscultation bilaterally, normal work of breathing GI: soft, nontender, nondistended, + BS MS: no deformity or atrophy  Ext: no pretibial edema, pedal pulses 2+= bilaterally Skin: warm and dry, no rash Neuro:  Strength and sensation are intact Psych: euthymic mood, full affect  EKG:  EKG is ordered today. The ekg ordered today shows normal sinus rhythm with nonspecific IVCD and early repolarization.  Recent  Labs: 05/27/2015: B Natriuretic Peptide 43.9; TSH 1.566 05/30/2015: Hemoglobin 12.6 11/14/2015: ALT 12; BUN 10; Creatinine, Ser 1.06; Platelets 432; Potassium 4.6; Sodium 139   Lipid Panel     Component Value Date/Time   CHOL 162 04/07/2011 0151   TRIG 62 04/07/2011 0151   HDL 36 (L) 04/07/2011 0151   CHOLHDL 4.5 04/07/2011 0151   VLDL 12 04/07/2011 0151   LDLCALC 114 (H) 04/07/2011 0151      Wt Readings from Last 3 Encounters:  05/22/16 214 lb 3.2 oz (97.2 kg)  04/20/16 205 lb (93 kg)  02/05/16 205 lb (93 kg)     Cardiac Studies Reviewed: 2-D  echocardiogram 05/28/2015: Study Conclusions  - Left ventricle: The cavity size was moderately dilated. Systolic   function was mildly to moderately reduced. The estimated ejection   fraction was in the range of 40% to 45%. There is akinesis of the   entireinferolateral myocardium. Features are consistent with a   pseudonormal left ventricular filling pattern, with concomitant   abnormal relaxation and increased filling pressure (grade 2   diastolic dysfunction). - Aortic valve: There was mild regurgitation. - Mitral valve: There was moderate regurgitation.  Cardiac catheterization 05/29/2015: Conclusion   1. Prox Cx to Mid Cx lesion, 100% stenosed. The lesion was previously treated with a stent (unknown type) . 2. Ramus-1 lesion, 50% stenosed. 3. Ramus-2 lesion, 80% stenosed. 4. 1st Diag lesion, 75% stenosed. 5. Mid LAD lesion, 60% stenosed. 6. LM lesion, 60% stenosed. 7. Prox RCA to Mid RCA lesion, 100% stenosed. 8. Mid RCA lesion, 100% stenosed.    Moderate distal left main (50-60% depending upon views), moderate mid LAD, moderately severe first diagonal, severe obstruction of the ramus intermedius/first obtuse marginal, and total occlusion of the RCA. The previously placed stent in the proximal to mid circumflex is totally occluded. The distal obtuse marginals fill by left to right and right to right collaterals.  Moderate  inferior wall hypokinesis. EF 50%   Recommendations:   Hold Plavix  TCTS consultation for consideration of bypass surgery including SVG to RCA, SVG to distal circumflex, SVG to ramus intermedius, SVG to diagonal, and LIMA to LAD. Should it be determined of bypass surgery is not indicated or not possible, consider stenting of the ramus intermedius with continued medical therapy (This would be my second choice rather than the primary treatment option for this relatively young patient). I believe that bypass surgery would be a more durable treatment option given his social issues and comorbidities.    ASSESSMENT AND PLAN: 1.  CAD, native vessel: The patient has extensive coronary disease but no symptoms of angina. He has good functional capacity. He will continue on his current medical program without changes. I recommended follow-up in 6 months. He seems to be more on track than he has in the past.   2. Essential hypertension: Blood pressure is well controlled on carvedilol and isosorbide  3. Tobacco abuse: Cessation counseling done  4. Hyperlipidemia: Treated with atorvastatin. Will update lipids. He had LFTs done in January and these are reviewed.  Current medicines are reviewed with the patient today.  The patient does not have concerns regarding medicines.  Labs/ tests ordered today include:   Orders Placed This Encounter  Procedures  . Lipid panel  . Comp Met (CMET)  . EKG 12-Lead   Disposition:   FU 6 months with Richardson Dopp, PA-C.  Deatra James, MD  05/22/2016 5:34 PM    [address], [place], [place]  [address] Phone: [phone]; Fax: [phone]

## 2016-05-22 NOTE — Patient Instructions (Signed)
Medication Instructions:  Your physician recommends that you continue on your current medications as directed. Please refer to the Current Medication list given to you today.  Labwork: Your physician recommends that you return for a FASTING LIPID and CMP--nothing to eat or drink after midnight, lab opens at 7:30 AM  Testing/Procedures: No new orders.   Follow-Up: Your physician recommends that you schedule a follow-up appointment in: 6 MONTHS with Richardson Dopp PA-C   Any Other Special Instructions Will Be Listed Below (If Applicable).     If you need a refill on your cardiac medications before your next appointment, please call your pharmacy.

## 2016-05-29 ENCOUNTER — Other Ambulatory Visit: Payer: Medicare Other

## 2016-06-30 ENCOUNTER — Emergency Department (HOSPITAL_COMMUNITY)
Admission: EM | Admit: 2016-06-30 | Discharge: 2016-06-30 | Disposition: A | Discharge status: Home / Self Care | Payer: No Typology Code available for payment source | Attending: Emergency Medicine | Admitting: Emergency Medicine

## 2016-06-30 ENCOUNTER — Emergency Department (HOSPITAL_COMMUNITY): Payer: No Typology Code available for payment source

## 2016-06-30 ENCOUNTER — Encounter (HOSPITAL_COMMUNITY): Payer: Self-pay | Admitting: Emergency Medicine

## 2016-06-30 DIAGNOSIS — I5022 Chronic systolic (congestive) heart failure: Secondary | ICD-10-CM | POA: Insufficient documentation

## 2016-06-30 DIAGNOSIS — S299XXA Unspecified injury of thorax, initial encounter: Secondary | ICD-10-CM | POA: Diagnosis not present

## 2016-06-30 DIAGNOSIS — Z7982 Long term (current) use of aspirin: Secondary | ICD-10-CM | POA: Insufficient documentation

## 2016-06-30 DIAGNOSIS — Z79899 Other long term (current) drug therapy: Secondary | ICD-10-CM | POA: Insufficient documentation

## 2016-06-30 DIAGNOSIS — F17210 Nicotine dependence, cigarettes, uncomplicated: Secondary | ICD-10-CM | POA: Diagnosis not present

## 2016-06-30 DIAGNOSIS — Y92410 Unspecified street and highway as the place of occurrence of the external cause: Secondary | ICD-10-CM | POA: Insufficient documentation

## 2016-06-30 DIAGNOSIS — I110 Hypertensive heart disease with heart failure: Secondary | ICD-10-CM | POA: Insufficient documentation

## 2016-06-30 DIAGNOSIS — I252 Old myocardial infarction: Secondary | ICD-10-CM | POA: Insufficient documentation

## 2016-06-30 DIAGNOSIS — Z041 Encounter for examination and observation following transport accident: Secondary | ICD-10-CM | POA: Diagnosis present

## 2016-06-30 DIAGNOSIS — I2510 Atherosclerotic heart disease of native coronary artery without angina pectoris: Secondary | ICD-10-CM | POA: Diagnosis not present

## 2016-06-30 DIAGNOSIS — Y999 Unspecified external cause status: Secondary | ICD-10-CM | POA: Insufficient documentation

## 2016-06-30 DIAGNOSIS — M549 Dorsalgia, unspecified: Secondary | ICD-10-CM

## 2016-06-30 DIAGNOSIS — Y9389 Activity, other specified: Secondary | ICD-10-CM | POA: Insufficient documentation

## 2016-06-30 DIAGNOSIS — M546 Pain in thoracic spine: Secondary | ICD-10-CM | POA: Insufficient documentation

## 2016-06-30 MED ORDER — METHOCARBAMOL 500 MG PO TABS: 500.0000 mg | ORAL_TABLET | Freq: Two times a day (BID) | ORAL | 0 refills | Status: DC
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
## 2016-07-10 ENCOUNTER — Ambulatory Visit (INDEPENDENT_AMBULATORY_CARE_PROVIDER_SITE_OTHER): Payer: Medicare Other | Admitting: Adult Health

## 2016-07-10 ENCOUNTER — Encounter: Payer: Self-pay | Admitting: Adult Health

## 2016-07-10 VITALS — BP 118/68 | HR 78 | Ht 74.0 in | Wt 211.8 lb

## 2016-07-10 DIAGNOSIS — Z5181 Encounter for therapeutic drug level monitoring: Secondary | ICD-10-CM

## 2016-07-10 DIAGNOSIS — R569 Unspecified convulsions: Secondary | ICD-10-CM | POA: Diagnosis not present
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
## 2016-07-10 NOTE — Patient Instructions (Signed)
Continue Keppra and Phenobarbital  Blood work today If your symptoms worsen or you develop new symptoms please let us know.

## 2016-07-11 LAB — COMPREHENSIVE METABOLIC PANEL
ALBUMIN: 4.1 g/dL (ref 3.5–5.5)
ALK PHOS: 83 IU/L (ref 39–117)
ALT: 20 IU/L (ref 0–44)
AST: 16 IU/L (ref 0–40)
Albumin/Globulin Ratio: 1.1 — ABNORMAL LOW (ref 1.2–2.2)
BUN / CREAT RATIO: 11 (ref 9–20)
BUN: 12 mg/dL (ref 6–24)
Bilirubin Total: <0.2 mg/dL (ref 0.0–1.2)
CO2: 23 mmol/L (ref 18–29)
Calcium: 9.1 mg/dL (ref 8.7–10.2)
Chloride: 104 mmol/L (ref 96–106)
Creatinine, Ser: 1.1 mg/dL (ref 0.76–1.27)
GFR calc non Af Amer: 76 mL/min/{1.73_m2} (ref >59)
GFR, EST AFRICAN AMERICAN: 88 mL/min/{1.73_m2} (ref >59)
GLOBULIN, TOTAL: 3.6 g/dL (ref 1.5–4.5)
Glucose: 108 mg/dL — ABNORMAL HIGH (ref 65–99)
Potassium: 4.3 mmol/L (ref 3.5–5.2)
SODIUM: 140 mmol/L (ref 134–144)
TOTAL PROTEIN: 7.7 g/dL (ref 6.0–8.5)

## 2016-07-11 LAB — CBC WITH DIFFERENTIAL/PLATELET
BASOS: 1 %
Basophils Absolute: 0.1 10*3/uL (ref 0.0–0.2)
EOS (ABSOLUTE): 0.3 10*3/uL (ref 0.0–0.4)
EOS: 5 %
HEMATOCRIT: 34.3 % — AB (ref 37.5–51.0)
Hemoglobin: 11.2 g/dL — ABNORMAL LOW (ref 12.6–17.7)
Immature Grans (Abs): 0 10*3/uL (ref 0.0–0.1)
Immature Granulocytes: 0 %
LYMPHS ABS: 2.1 10*3/uL (ref 0.7–3.1)
Lymphs: 31 %
MCH: 28.4 pg (ref 26.6–33.0)
MCHC: 32.7 g/dL (ref 31.5–35.7)
MCV: 87 fL (ref 79–97)
MONOCYTES: 13 %
MONOS ABS: 0.9 10*3/uL (ref 0.1–0.9)
NEUTROS ABS: 3.3 10*3/uL (ref 1.4–7.0)
Neutrophils: 50 %
Platelets: 380 10*3/uL — ABNORMAL HIGH (ref 150–379)
RBC: 3.95 x10E6/uL — AB (ref 4.14–5.80)
RDW: 16.3 % — AB (ref 12.3–15.4)
WBC: 6.7 10*3/uL (ref 3.4–10.8)

## 2016-07-11 LAB — PHENOBARBITAL LEVEL: Phenobarbital, Serum: 19 ug/mL (ref 15–40)

## 2016-07-11 NOTE — Progress Notes (Signed)
I have reviewed and agreed above plan. 

## 2016-07-12 ENCOUNTER — Telehealth: Payer: Self-pay | Admitting: *Deleted

## 2016-07-12 NOTE — Telephone Encounter (Signed)
LMVM for pt on cell # (ok per DPR) that lab results consistent with his previous lab work.  Need for him to call back with who his pcp his so I can fax results to them.  He may leave this with phone operator.

## 2016-07-12 NOTE — Telephone Encounter (Signed)
-----   Message from Ward Givens, NP sent at 07/11/2016  8:30 AM EDT ----- Lab work consistent with previous lab work. Please call patient- also fax to PCP

## 2016-07-17 ENCOUNTER — Encounter: Payer: Self-pay | Admitting: *Deleted

## 2016-07-17 NOTE — Telephone Encounter (Signed)
Pt called back.   I told him that he would get a letter in mail with lab results attached.  He saw Dr. Kennon Holter, but has not seen new provider since.  Going to [facility].   I told him I would forward to the clinic.

## 2016-07-17 NOTE — Telephone Encounter (Signed)
LMVM for pt that trying again to reach him relating to finding out his pcp.  I will mail copy of these lab results to his home address as well since he has not returned my call.

## 2016-07-18 NOTE — Telephone Encounter (Signed)
LMVM at [facility] relating to pt and MR that want to fax. [phone]

## 2016-07-22 NOTE — Telephone Encounter (Signed)
Received call back and pt does not see them.   He had appt but did not follow thru (that was some time ago).  I had mailed copy of results to pt so he has a copy to follow thru with this when he gets pcp.

## 2016-07-28 ENCOUNTER — Emergency Department (HOSPITAL_COMMUNITY)
Admission: EM | Admit: 2016-07-28 | Discharge: 2016-07-28 | Disposition: A | Discharge status: Home / Self Care | Payer: Medicare Other | Attending: Emergency Medicine | Admitting: Emergency Medicine

## 2016-07-28 ENCOUNTER — Encounter (HOSPITAL_COMMUNITY): Payer: Self-pay | Admitting: Emergency Medicine

## 2016-07-28 DIAGNOSIS — I25119 Atherosclerotic heart disease of native coronary artery with unspecified angina pectoris: Secondary | ICD-10-CM | POA: Diagnosis not present

## 2016-07-28 DIAGNOSIS — R110 Nausea: Secondary | ICD-10-CM | POA: Diagnosis not present

## 2016-07-28 DIAGNOSIS — E119 Type 2 diabetes mellitus without complications: Secondary | ICD-10-CM | POA: Diagnosis not present

## 2016-07-28 DIAGNOSIS — I5022 Chronic systolic (congestive) heart failure: Secondary | ICD-10-CM | POA: Insufficient documentation

## 2016-07-28 DIAGNOSIS — Z79899 Other long term (current) drug therapy: Secondary | ICD-10-CM | POA: Insufficient documentation

## 2016-07-28 DIAGNOSIS — I110 Hypertensive heart disease with heart failure: Secondary | ICD-10-CM | POA: Insufficient documentation

## 2016-07-28 DIAGNOSIS — Z87891 Personal history of nicotine dependence: Secondary | ICD-10-CM | POA: Diagnosis not present

## 2016-07-28 DIAGNOSIS — Z955 Presence of coronary angioplasty implant and graft: Secondary | ICD-10-CM | POA: Diagnosis not present

## 2016-07-28 DIAGNOSIS — Z7982 Long term (current) use of aspirin: Secondary | ICD-10-CM | POA: Insufficient documentation

## 2016-07-28 HISTORY — DX: Type 2 diabetes mellitus without complications: E11.9

## 2016-07-28 MED ORDER — ONDANSETRON 4 MG PO TBDP
4.0000 mg | ORAL_TABLET | Freq: Once | ORAL | Status: AC
  Administered 2016-07-28: 4 mg via ORAL
  Filled 2016-07-28: qty 1

## 2016-07-28 NOTE — ED Notes (Signed)
Pt given ginger ale and encouraged to drink slowly for PO challenge

## 2016-07-28 NOTE — ED Provider Notes (Signed)
[facility] DEPT Provider Note   CSN: [id number] Arrival date & time: 07/28/16  1107     History   Chief Complaint Chief Complaint  Patient presents with  . Nausea    nausea x 4 hours.    HPI [name] is a 55 y.o. male.  HPI   55 year old male with history of CAD status post stenting, CHF, seizure disorder, chronic disorder, valvular disease presenting with c/o nausea. Patient states that he has family member staying at his house over the weekend therefore he decided to stay at a local hotel overnight.  He stayed for from 9pm last night to 9am today.  This morning he report having some mild nasal congestion. He opened the motel windows and noticed black mold in the window sill. Shortly after he reported feeling nauseous. He is been feeling nauseous for the past for 5 hours and drove himself to the ER. He did not vomit. He denies any associated fever or chills lightheadedness dizziness diaphoresis shortness of breath or chest pain. Denies any significant abdominal pain or back pain. He reportedly has 9 heart attacks in the past but states this nausea does not feel similar to prior heart event.  He drank coffee this AM, denies eating any exotic food, no recent alcohol or street drug use.  Report having exposure to black mold in his house in the past where he have to move out.  Therefore, he is concern about this recent exposure.     Past Medical History:  Diagnosis Date  . CAD (coronary artery disease)    a. Overlapping BMS-prox LCx in 2009 b. NSTEMI 2012 due to prox LCx ISR s/p PTCA alone; EF 45% w/ basal inf HK. Had run out of Plavix 1 week prior. c. NSTEMI 05/2015 after running out of Plavix - cath with 3V CAD, Patient left AMA prior to agreeing to CABG.  . Chronic systolic CHF (congestive heart failure) ([place])    a. Last EF assessment 40-45% by echo, 45-50% by cath 05/2015.  [name] Clotting disorder ([place])   . Diabetes mellitus without complication ([place])   . HLD (hyperlipidemia)     . Hyperglycemia A1C 6.0 05/2015.  [name] Hypertension   . MI (myocardial infarction) x 7  . Poor compliance   . Seizure disorder ([place])   . Seizures ([place])   . TOBACCO ABUSE 02/22/2009  . Valvular heart disease    a. Echo 05/2015: mild AI, mod MR.    Patient Active Problem List   Diagnosis Date Noted  . Essential hypertension 06/06/2015  . Chronic systolic CHF (congestive heart failure) ([place]) 06/06/2015  . H/O noncompliance with medical treatment, presenting hazards to health 06/06/2015  . Valvular heart disease 06/06/2015  . Coronary artery disease involving native coronary artery of native heart with unstable angina pectoris ([place])   . Non-STEMI (non-ST elevated myocardial infarction) ([place]) 05/27/2015  . Seizure disorder ([place]) 05/27/2015  . Refractory epilepsy ([place]) 01/20/2014  . Ischemic cardiomyopathy 12/01/2013  . CAD (coronary artery disease) 10/13/2013  . Unstable angina pectoris ([place]) 10/13/2013  . Special screening for malignant neoplasms, colon 08/27/2013  . Testicular pain, right 08/27/2013  . Chest pain at rest 11/10/2012  . TOBACCO ABUSE 02/22/2009  . HYPERLIPIDEMIA 02/06/2009  . SEIZURE DISORDER 02/06/2009    Past Surgical History:  Procedure Laterality Date  . CARDIAC CATHETERIZATION [name] 05/29/2015   Procedure: Left Heart Cath and Coronary Angiography;  Surgeon: Belva Crome, MD;  Location: [facility] CV LAB;  Service: Cardiovascular;  Laterality: [name];  . COLONOSCOPY    . CORONARY ANGIOPLASTY  03/2011   PTCA-prox LCx ISR  . CORONARY ANGIOPLASTY WITH STENT PLACEMENT  10/2007   BMS-prox LCx  . LEFT HEART CATHETERIZATION WITH CORONARY ANGIOGRAM [name] 10/13/2013   Procedure: LEFT HEART CATHETERIZATION WITH CORONARY ANGIOGRAM;  Surgeon: Blane Ohara, MD;  Location: [facility] CATH LAB;  Service: Cardiovascular;  Laterality: [name];  . TEE WITHOUT CARDIOVERSION [name] 06/16/2015   Procedure: TRANSESOPHAGEAL ECHOCARDIOGRAM (TEE);  Surgeon: Josue Hector, MD;  Location: [facility] ENDOSCOPY;   Service: Cardiovascular;  Laterality: [name];       Home Medications    Prior to Admission medications   Medication Sig Start Date End Date Taking? Authorizing Provider  aspirin 325 MG tablet Take 325 mg by mouth daily.    Historical Provider, MD  atorvastatin (LIPITOR) 20 MG tablet Take 20 mg by mouth daily.    Historical Provider, MD  Bilberry, Vaccinium myrtillus, (BILBERRY EXTRACT PO) Take 2 drops by mouth daily.     Historical Provider, MD  carvedilol (COREG) 6.25 MG tablet Take 6.25 mg by mouth 2 (two) times daily with a meal.    Historical Provider, MD  clopidogrel (PLAVIX) 75 MG tablet Take 1 tablet (75 mg total) by mouth daily. 10/04/15   Burtis Junes, NP  isosorbide mononitrate (IMDUR) 30 MG 24 hr tablet Take 0.5 tablets (15 mg total) by mouth daily. 10/04/15   Burtis Junes, NP  levETIRAcetam (KEPPRA) 500 MG tablet Take 1 tablet (500 mg total) by mouth 2 (two) times daily. 03/07/16   Kathrynn Ducking, MD  methocarbamol (ROBAXIN) 500 MG tablet Take 1 tablet (500 mg total) by mouth 2 (two) times daily. Patient not taking: Reported on 07/10/2016 06/30/16   Hyman Bible, PA-C  Misc Natural Products (GINKOGIN PO) Take 2 drops by mouth daily.     Historical Provider, MD  nitroGLYCERIN (NITROSTAT) 0.4 MG SL tablet Place 1 tablet (0.4 mg total) under the tongue every 5 (five) minutes as needed for chest pain (3 dose MAX). 02/05/16   Brittainy M Simmons, PA-C  OVER THE COUNTER MEDICATION MEGA MAN - Take one (1) tablet by mouth daily.    Historical Provider, MD  PHENobarbital (LUMINAL) 32.4 MG tablet Take 1 tablet (32.4 mg total) by mouth 4 (four) times daily. 03/07/16   Kathrynn Ducking, MD  Rosemary Oil OIL Take 2 drops by mouth daily.     Historical Provider, MD    Family History Family History  Problem Relation Age of Onset  . Coronary artery disease Maternal Aunt   . Heart disease Maternal Uncle   . Stroke Maternal Uncle   . Heart attack Maternal Uncle     X3  . Hypertension       Social History Social History  Substance Use Topics  . Smoking status: Former Smoker    Years: 35.00    Types: Cigars    Quit date: 06/21/2016  . Smokeless tobacco: Never Used  . Alcohol use No     Allergies   Food allergy formula   Review of Systems Review of Systems  All other systems reviewed and are negative.    Physical Exam Updated Vital Signs BP 113/79 (BP Location: Left Arm)   Pulse 85   Temp 98.3 F (36.8 C) (Oral)   Resp 18   Ht 6\' 2"  (1.88 m)   Wt 97.1 kg   SpO2 97%   BMI 27.48 kg/m   Physical Exam  Constitutional: He  appears well-developed and well-nourished. No distress.  HENT:  Head: Atraumatic.  Eyes: Conjunctivae are normal.  Neck: Neck supple.  Cardiovascular: Normal rate, regular rhythm and intact distal pulses.   Pulmonary/Chest: Effort normal and breath sounds normal. He has no wheezes. He exhibits no tenderness.  Abdominal: Soft. Bowel sounds are normal. There is no tenderness.  Neurological: He is alert.  Skin: No rash noted.  Psychiatric: He has a normal mood and affect.  Nursing note and vitals reviewed.    ED Treatments / Results  Labs (all labs ordered are listed, but only abnormal results are displayed) Labs Reviewed - No data to display  EKG  EKG Interpretation None     ED ECG REPORT   Date: 07/28/2016  Rate: 67  Rhythm: normal sinus rhythm  QRS Axis: normal  Intervals: normal  ST/T Wave abnormalities: nonspecific ST/T changes  Conduction Disutrbances:none  Narrative Interpretation: ST elevation anteriorly, borderline T abnormality inferiorly  Old EKG Reviewed: unchanged  I have personally reviewed the EKG tracing and agree with the computerized printout as noted.   Radiology No results found.  Procedures Procedures (including critical care time)  Medications Ordered in ED Medications - No data to display   Initial Impression / Assessment and Plan / ED Course  I have reviewed the triage vital signs  and the nursing notes.  Pertinent labs & imaging results that were available during my care of the patient were reviewed by me and considered in my medical decision making (see chart for details).  Clinical Course    BP 100/76   Pulse 71   Temp 98.3 F (36.8 C) (Oral)   Resp 14   Ht 6\' 2"  (1.88 m)   Wt 97.1 kg   SpO2 100%   BMI 27.48 kg/m    Final Clinical Impressions(s) / ED Diagnoses   Final diagnoses:  Nausea    New Prescriptions New Prescriptions   No medications on file   12:29 PM Pt report having nausea after noticing black mold in his hotel room that he stayed over night.  Suspect visceral response due to prior black mold exposure in the past when pt had to move out of his old house.  No associated CP/SOB or other concerning feature.  Will obtain screening EKG, give zofran and will monitor.    12:50 PM EKG without acute ischemic changes.  Care discussed with DR. Zammit.   3:19 PM Nausea resolved. Pt felt better, stable for discharge. Pt understand to return if condition worsen.     Domenic Moras, PA-C 07/28/16 [place], MD 07/29/16 [phone]

## 2016-07-28 NOTE — ED Notes (Signed)
PT DISCHARGED. INSTRUCTIONS GIVEN. AAOX4. PT IN NO APPARENT DISTRESS OR PAIN. THE OPPORTUNITY TO ASK QUESTIONS WAS PROVIDED. 

## 2016-07-28 NOTE — ED Triage Notes (Signed)
Pt reports that he slept in a hotel room tat may have had black mold. C/o nausea this am. Denies vomiting, denies chest pain and shortness of breath. Drank coffee for breakfast. Stated that he tolerated that well. Pt is alert , oriented and appropriate

## 2016-07-28 NOTE — ED Notes (Signed)
PT STS HE TOLERATED THE GINGER ALE WELL.

## 2016-08-02 ENCOUNTER — Other Ambulatory Visit: Payer: Self-pay | Admitting: *Deleted

## 2016-08-02 MED ORDER — CARVEDILOL 6.25 MG PO TABS: 6.2500 mg | ORAL_TABLET | Freq: Two times a day (BID) | ORAL | 9 refills | Status: DC

## 2016-09-14 ENCOUNTER — Telehealth: Payer: Self-pay | Admitting: Neurology

## 2016-09-17 ENCOUNTER — Other Ambulatory Visit: Payer: Self-pay | Admitting: Adult Health

## 2016-09-17 MED ORDER — PHENOBARBITAL 32.4 MG PO TABS: 32.4000 mg | ORAL_TABLET | Freq: Four times a day (QID) | ORAL | 5 refills | Status: DC

## 2016-09-17 NOTE — Telephone Encounter (Signed)
Patient requesting refill of PHENobarbital (LUMINAL) 32.4 MG tablet called to [facility] on [address].

## 2016-09-17 NOTE — Addendum Note (Signed)
Addended byOliver Hum on: 09/17/2016 03:20 PM   Modules accepted: Orders

## 2016-09-18 NOTE — Telephone Encounter (Signed)
The patient called last night that he was out of his phenobarbital and he had called earlier but the prescription was not sent to the pharmacy.  I contacted the pharmacy and wrote for 60 pills (I did not have access to Epic at the time).   He will need a longer term refill.

## 2016-09-18 NOTE — Telephone Encounter (Signed)
Looks like a new rx was faxed in yesterday w/ 6 months of refills.

## 2016-09-18 NOTE — Telephone Encounter (Signed)
Faxed printed rx phenobarbital to pt pharmacy as requested. Fax: [phone]. Received confirmation.

## 2016-10-19 ENCOUNTER — Other Ambulatory Visit: Payer: Self-pay | Admitting: Nurse Practitioner

## 2016-10-19 DIAGNOSIS — I340 Nonrheumatic mitral (valve) insufficiency: Secondary | ICD-10-CM

## 2016-10-19 DIAGNOSIS — Z91199 Patient's noncompliance with other medical treatment and regimen due to unspecified reason: Secondary | ICD-10-CM

## 2016-10-19 DIAGNOSIS — I25118 Atherosclerotic heart disease of native coronary artery with other forms of angina pectoris: Secondary | ICD-10-CM

## 2016-10-19 DIAGNOSIS — Z9119 Patient's noncompliance with other medical treatment and regimen: Secondary | ICD-10-CM

## 2016-10-19 DIAGNOSIS — I214 Non-ST elevation (NSTEMI) myocardial infarction: Secondary | ICD-10-CM

## 2016-10-19 DIAGNOSIS — I10 Essential (primary) hypertension: Secondary | ICD-10-CM

## 2016-10-29 ENCOUNTER — Other Ambulatory Visit: Payer: Self-pay | Admitting: *Deleted

## 2016-10-29 DIAGNOSIS — I10 Essential (primary) hypertension: Secondary | ICD-10-CM

## 2016-10-29 DIAGNOSIS — Z9119 Patient's noncompliance with other medical treatment and regimen: Secondary | ICD-10-CM

## 2016-10-29 DIAGNOSIS — I340 Nonrheumatic mitral (valve) insufficiency: Secondary | ICD-10-CM

## 2016-10-29 DIAGNOSIS — I214 Non-ST elevation (NSTEMI) myocardial infarction: Secondary | ICD-10-CM

## 2016-10-29 DIAGNOSIS — I25118 Atherosclerotic heart disease of native coronary artery with other forms of angina pectoris: Secondary | ICD-10-CM

## 2016-10-29 DIAGNOSIS — Z91199 Patient's noncompliance with other medical treatment and regimen due to unspecified reason: Secondary | ICD-10-CM

## 2016-10-29 MED ORDER — CLOPIDOGREL BISULFATE 75 MG PO TABS: 75.0000 mg | ORAL_TABLET | Freq: Every day | ORAL | 2 refills | Status: DC

## 2016-11-27 ENCOUNTER — Encounter: Payer: Self-pay | Admitting: Physician Assistant

## 2016-11-27 ENCOUNTER — Ambulatory Visit (INDEPENDENT_AMBULATORY_CARE_PROVIDER_SITE_OTHER): Payer: Medicare Other | Admitting: Physician Assistant

## 2016-11-27 ENCOUNTER — Encounter (INDEPENDENT_AMBULATORY_CARE_PROVIDER_SITE_OTHER): Payer: Self-pay

## 2016-11-27 VITALS — BP 128/70 | HR 80 | Ht 74.0 in | Wt 215.8 lb

## 2016-11-27 DIAGNOSIS — I2510 Atherosclerotic heart disease of native coronary artery without angina pectoris: Secondary | ICD-10-CM

## 2016-11-27 DIAGNOSIS — E7800 Pure hypercholesterolemia, unspecified: Secondary | ICD-10-CM

## 2016-11-27 DIAGNOSIS — I5022 Chronic systolic (congestive) heart failure: Secondary | ICD-10-CM

## 2016-11-27 DIAGNOSIS — I10 Essential (primary) hypertension: Secondary | ICD-10-CM

## 2016-11-27 DIAGNOSIS — R600 Localized edema: Secondary | ICD-10-CM | POA: Diagnosis not present

## 2016-11-27 DIAGNOSIS — I340 Nonrheumatic mitral (valve) insufficiency: Secondary | ICD-10-CM

## 2016-11-27 DIAGNOSIS — F17200 Nicotine dependence, unspecified, uncomplicated: Secondary | ICD-10-CM

## 2016-11-27 MED ORDER — CARVEDILOL 3.125 MG PO TABS: 3.1250 mg | ORAL_TABLET | Freq: Two times a day (BID) | ORAL | 3 refills | Status: DC

## 2016-11-27 MED ORDER — ASPIRIN EC 81 MG PO TBEC
81.0000 mg | DELAYED_RELEASE_TABLET | Freq: Every day | ORAL | Status: DC
Start: 2016-11-27 — End: 2018-01-19

## 2016-11-27 NOTE — Addendum Note (Signed)
Addended by: Michae Kava on: 11/27/2016 04:49 PM   Modules accepted: Orders

## 2016-11-27 NOTE — Progress Notes (Signed)
Cardiology Office Note:    Date:  11/27/2016   ID:  [name], DOB 03-Jan-1961, MRN [id number]  PCP:  No PCP Per Patient  Cardiologist:  Dr. Sherren Mocha   Electrophysiologist:  n/a  Referring MD: Sherren Mocha, MD   Chief Complaint  Patient presents with  . Follow-up    CAD    History of Present Illness:    [name] is a 56 y.o. male with a hx of CAD status post BMS x 2 to the prox CFX in 2009, non-STEMI 2012 due to ISR of CFX stents (off Plavix) treated with angioplasty, ischemic cardiomyopathy, HTN, HL, seizure disorder, reported "clotting disorder," tobacco abuse, noncompliance, poor dentition.  Cardiac cath in 09/2013 demonstrated patent stents and nonobstructive disease elsewhere. He was then admitted in 2016 with NSTEMI and LHC demonstrated 3V CAD with total occlusion of the RCA and LCx and mod stenosis of the LM and LAD.  He also has mod MR.  He was seen by TCTS and he declined CABG.  He was last seen by Dr. Sherren Mocha in 8/17.   He returns for Cardiology follow up. He is here alone.  He has developed LE edema over the past couple of months.  Leg elevation does not help.  He denies claudication.  He denies shortness of breath, orthopnea, PND.  He denies chest pain, syncope.  He denies any bleeding issues.  He still smokes.  He has been out of Coreg for about a month - he lost the Rx.   Prior CV studies:   The following studies were reviewed today:  TEE 8/16 EF 45-50, inf HK, mod MR, mild TR  LHC 8/16 LM 60 LAD mid 60, D1 75 RI 50, 80 LCx prox stent 100 RCA prox 100 EF 45-50  Echo 8/16 EF 40-45, inf-lat AK, Gr 2 diastolic dysfunction, mild AI, mod MR   Past Medical History:  Diagnosis Date  . CAD (coronary artery disease)    a. Overlapping BMS-prox LCx in 2009 b. NSTEMI 2012 due to prox LCx ISR s/p PTCA alone; EF 45% w/ basal inf HK. Had run out of Plavix 1 week prior. c. NSTEMI 05/2015 after running out of Plavix - cath with 3V CAD, Patient left AMA  prior to agreeing to CABG. // d. LHC 8/16: LM 60; LAD mid 60, D1 75; RI 50, 80; LCx prox stent 100; RCA prox 100; EF 45-50 >> seen by TCTS - declined CABG >> Med Rx.  . Chronic systolic CHF (congestive heart failure) ([place]) 06/06/2015   a - Echo 8/16:  EF 40-45, inf-lat AK, Gr 2 diastolic dysfunction, mild AI, mod MR // b - TEE 8/16: EF 45-50, inf HK, mod MR, mild TR  . Clotting disorder ([place])   . Diabetes mellitus without complication ([place])   . HLD (hyperlipidemia)   . Hyperglycemia A1C 6.0 05/2015.  [name] Hypertension   . MI (myocardial infarction) x 7  . Poor compliance   . Seizure disorder ([place])   . Seizures ([place])   . TOBACCO ABUSE 02/22/2009  . Valvular heart disease    a. Echo 05/2015: mild AI, mod MR.    Past Surgical History:  Procedure Laterality Date  . CARDIAC CATHETERIZATION [name] 05/29/2015   Procedure: Left Heart Cath and Coronary Angiography;  Surgeon: Belva Crome, MD;  Location: [place] [facility];  Service: Cardiovascular;  Laterality: [name];  . COLONOSCOPY    . CORONARY ANGIOPLASTY  03/2011   PTCA-prox LCx ISR  .  CORONARY ANGIOPLASTY WITH STENT PLACEMENT  10/2007   BMS-prox LCx  . LEFT HEART CATHETERIZATION WITH CORONARY ANGIOGRAM [name] 10/13/2013   Procedure: LEFT HEART CATHETERIZATION WITH CORONARY ANGIOGRAM;  Surgeon: Blane Ohara, MD;  Location: [facility] CATH LAB;  Service: Cardiovascular;  Laterality: [name];  . TEE WITHOUT CARDIOVERSION [name] 06/16/2015   Procedure: TRANSESOPHAGEAL ECHOCARDIOGRAM (TEE);  Surgeon: Josue Hector, MD;  Location: [facility] ENDOSCOPY;  Service: Cardiovascular;  Laterality: [name];    Current Medications: Current Meds  Medication Sig  . atorvastatin (LIPITOR) 20 MG tablet Take 20 mg by mouth daily.  . clopidogrel (PLAVIX) 75 MG tablet Take 1 tablet (75 mg total) by mouth daily.  . isosorbide mononitrate (IMDUR) 30 MG 24 hr tablet TAKE 1/2 TABLET BY MOUTH DAILY  . levETIRAcetam (KEPPRA) 500 MG tablet Take 1 tablet (500 mg total) by mouth 2 (two) times daily.  .  nitroGLYCERIN (NITROSTAT) 0.4 MG SL tablet Place 1 tablet (0.4 mg total) under the tongue every 5 (five) minutes as needed for chest pain (3 dose MAX).  [name] OVER THE COUNTER MEDICATION Take 1 drop by mouth daily. BLACKSEED OIL 1 DROP BY MOUTH ONCE DAILY  . PHENobarbital (LUMINAL) 32.4 MG tablet Take 1 tablet (32.4 mg total) by mouth 4 (four) times daily.  . [DISCONTINUED] aspirin 325 MG tablet Take 325 mg by mouth daily.  . [DISCONTINUED] carvedilol (COREG) 6.25 MG tablet Take 1 tablet (6.25 mg total) by mouth 2 (two) times daily with a meal.     Allergies:   Food allergy formula   Social History   Social History  . Marital status: Single    Spouse name: [name]  . Number of children: 0  . Years of education: 12th   Occupational History  . [name]    Social History Main Topics  . Smoking status: Former Smoker    Years: 35.00    Types: Cigars    Quit date: 06/21/2016  . Smokeless tobacco: Never Used  . Alcohol use No  . Drug use: No  . Sexual activity: Not Currently   Other Topics Concern  . None   Social History Narrative   Patient is homeless            Family History  Problem Relation Age of Onset  . Coronary artery disease Maternal Aunt   . Heart disease Maternal Uncle   . Stroke Maternal Uncle   . Heart attack Maternal Uncle     X3  . Hypertension       ROS:   Please see the history of present illness.    Review of Systems  Constitution: Positive for decreased appetite.  Cardiovascular: Positive for leg swelling.   All other systems reviewed and are negative.   EKGs/Labs/Other Test Reviewed:    EKG:  EKG is  ordered today.  The ekg ordered today demonstrates NSR, HR 80, normal axis, IVCD, QTc 435 ms, no changes.   Recent Labs: 07/10/2016: ALT 20; BUN 12; Creatinine, Ser 1.10; Platelets 380; Potassium 4.3; Sodium 140   Recent Lipid Panel    Component Value Date/Time   CHOL 162 04/07/2011 0151   TRIG 62 04/07/2011 0151   HDL 36 (L) 04/07/2011 0151   CHOLHDL  4.5 04/07/2011 0151   VLDL 12 04/07/2011 0151   LDLCALC 114 (H) 04/07/2011 0151     Physical Exam:    VS:  BP 128/70   Pulse 80   Ht 6\' 2"  (1.88 m)   Wt 215 lb 12.8 oz (  97.9 kg)   BMI 27.71 kg/m     Wt Readings from Last 3 Encounters:  11/27/16 215 lb 12.8 oz (97.9 kg)  07/28/16 214 lb (97.1 kg)  07/10/16 211 lb 12.8 oz (96.1 kg)     Physical Exam  Constitutional: He is oriented to person, place, and time. He appears well-developed and well-nourished. No distress.  HENT:  Head: Normocephalic and atraumatic.  Eyes: No scleral icterus.  Neck: No JVD present.  Cardiovascular: Normal rate and regular rhythm.   Murmur heard.  Holosystolic murmur is present with a grade of 2/6  at the lower left sternal border, apex radiating to the axilla Pulmonary/Chest: Effort normal. He has no wheezes. He has no rales.  Abdominal: Soft. There is no tenderness.  Musculoskeletal: He exhibits edema.  1+ bilateral LE edema  Neurological: He is alert and oriented to person, place, and time.  Skin: Skin is warm and dry.  Psychiatric: He has a normal mood and affect.    ASSESSMENT:    1. Leg edema   2. Coronary artery disease involving native coronary artery of native heart without angina pectoris   3. Chronic systolic CHF (congestive heart failure) ([place])   4. Essential hypertension   5. Pure hypercholesterolemia   6. Non-rheumatic mitral regurgitation   7. TOBACCO ABUSE    PLAN:    In order of problems listed above:  1. Leg edema - He has a high salt load in his diet.  I suspect his edema is related to this in the setting of systolic CHF.  His lung exam is clear.  We discussed limited his salt.  -  BMET, CBC, TSH, BNP  -  Obtain Echo   -  Lasix 40 mg QD x 3 days, then take prn edema  -  BMET 1 week  2. CAD - He has 3v CAD that is tx medically.  He denies angina.  Continue Lipitor, Plavix, ASA, Coreg.  He can decrease his ASA to 81 mg QD.    3. Chronic systolic CHF -  NYHA 2. He  does have some edema but otherwise seems to be stable from a volume standpoint.    -  Treat edema with Lasix x 3 days, then prn as noted above.    -  Continue nitrates.    -  Resume beta-blocker with Coreg 3.125 mg bid.    -  Consider angiotensin receptor blocker or ACE inhibitor at follow up.    -  Obtain Echo.  4. HTN - BP is controlled.   5. HL - Continue statin.  Arrange fasting Lipids.   6. MR - Mod by last echo.  Repeat echo given LE edema.  7. Tobacco abuse - I have advised him to quit smoking.     Medication Adjustments/Labs and Tests Ordered: Current medicines are reviewed at length with the patient today.  Concerns regarding medicines are outlined above.  Medication changes, Labs and Tests ordered today are outlined in the Patient Instructions noted below. Patient Instructions  Medication Instructions:  1. DECREASE Aspirin to 81 mg Once daily  2. START Lasix (Furosemide) 40 mg Once daily for 3 days total.  Then, only take it Once daily as needed for leg swelling.  Call us if you are taking it once daily. 3. DECREASE Coreg (Carvedilol) to 3.125 mg Twice daily   Labwork: 1. Today - BMET, CBC, TSH, LFTs, BNP 2. IN 1 week - Fasting Lipids and BMET  Testing/Procedures: Schedule an Echocardiogram -  Your physician has requested that you have an echocardiogram. Echocardiography is a painless test that uses sound waves to create images of your heart. It provides your doctor with information about the size and shape of your heart and how well your heart's chambers and valves are working. This procedure takes approximately one hour. There are no restrictions for this procedure.  Follow-Up: Richardson Dopp, PA-C in 1 month.    Any Other Special Instructions Will Be Listed Below (If Applicable). Watch salt in your diet.  Try to eat less then 2000 mg (2 grams) of sodium (salt) in 1 day.  If you need a refill on your cardiac medications before your next appointment, please call your  pharmacy.    Return in about 1 month (around 12/25/2016) for Close Follow Up, w/ Richardson Dopp, PA-C.   Signed, Richardson Dopp, PA-C  11/27/2016 4:35 PM    [facility] [address], [place], [place]  [address] Phone: [phone]; Fax: [phone]

## 2016-11-27 NOTE — Patient Instructions (Addendum)
Medication Instructions:  1. DECREASE Aspirin to 81 mg Once daily  2. START Lasix (Furosemide) 40 mg Once daily for 3 days total.  Then, only take it Once daily as needed for leg swelling.  Call us if you are taking it once daily. 3. DECREASE Coreg (Carvedilol) to 3.125 mg Twice daily   Labwork: 1. Today - BMET, CBC, TSH, LFTs, BNP 2. IN 1 week - Fasting Lipids and BMET  Testing/Procedures: Schedule an Echocardiogram - Your physician has requested that you have an echocardiogram. Echocardiography is a painless test that uses sound waves to create images of your heart. It provides your doctor with information about the size and shape of your heart and how well your heart's chambers and valves are working. This procedure takes approximately one hour. There are no restrictions for this procedure.  Follow-Up: Richardson Dopp, PA-C in 1 month.    Any Other Special Instructions Will Be Listed Below (If Applicable). Watch salt in your diet.  Try to eat less then 2000 mg (2 grams) of sodium (salt) in 1 day.  If you need a refill on your cardiac medications before your next appointment, please call your pharmacy.

## 2016-11-27 NOTE — Addendum Note (Signed)
Addended by: Eulis Foster on: 11/27/2016 04:53 PM   Modules accepted: Orders

## 2016-11-28 ENCOUNTER — Other Ambulatory Visit: Payer: Self-pay | Admitting: *Deleted

## 2016-11-28 ENCOUNTER — Telehealth: Payer: Self-pay | Admitting: Physician Assistant

## 2016-11-28 DIAGNOSIS — D649 Anemia, unspecified: Secondary | ICD-10-CM

## 2016-11-28 LAB — CBC
HEMOGLOBIN: 9.4 g/dL — AB (ref 13.0–17.7)
Hematocrit: 29.4 % — ABNORMAL LOW (ref 37.5–51.0)
MCH: 27.1 pg (ref 26.6–33.0)
MCHC: 32 g/dL (ref 31.5–35.7)
MCV: 85 fL (ref 79–97)
Platelets: 421 10*3/uL — ABNORMAL HIGH (ref 150–379)
RBC: 3.47 x10E6/uL — ABNORMAL LOW (ref 4.14–5.80)
RDW: 18 % — ABNORMAL HIGH (ref 12.3–15.4)
WBC: 7.9 10*3/uL (ref 3.4–10.8)

## 2016-11-28 LAB — BASIC METABOLIC PANEL
BUN/Creatinine Ratio: 11 (ref 9–20)
BUN: 10 mg/dL (ref 6–24)
CALCIUM: 8.9 mg/dL (ref 8.7–10.2)
CHLORIDE: 101 mmol/L (ref 96–106)
CO2: 21 mmol/L (ref 18–29)
Creatinine, Ser: 0.94 mg/dL (ref 0.76–1.27)
GFR calc non Af Amer: 92 mL/min/{1.73_m2} (ref >59)
GFR, EST AFRICAN AMERICAN: 106 mL/min/{1.73_m2} (ref >59)
GLUCOSE: 85 mg/dL (ref 65–99)
POTASSIUM: 4.3 mmol/L (ref 3.5–5.2)
Sodium: 139 mmol/L (ref 134–144)

## 2016-11-28 LAB — HEPATIC FUNCTION PANEL
ALK PHOS: 84 IU/L (ref 39–117)
ALT: 15 IU/L (ref 0–44)
AST: 15 IU/L (ref 0–40)
Albumin: 4.1 g/dL (ref 3.5–5.5)
Bilirubin, Direct: 0.05 mg/dL (ref 0.00–0.40)
Total Protein: 7.6 g/dL (ref 6.0–8.5)

## 2016-11-28 LAB — TSH: TSH: 1.96 u[IU]/mL (ref 0.450–4.500)

## 2016-11-28 LAB — PRO B NATRIURETIC PEPTIDE: NT-Pro BNP: 74 pg/mL (ref 0–121)

## 2016-11-28 MED ORDER — FUROSEMIDE 40 MG PO TABS: ORAL_TABLET | ORAL | 1 refills | Status: DC

## 2016-11-28 MED ORDER — FUROSEMIDE 40 MG PO TABS: ORAL_TABLET | ORAL | 3 refills | Status: DC

## 2016-11-28 NOTE — Telephone Encounter (Signed)
SCRIPT SENT  TO PHARMACY  FOR  FUROSEMIDE PER PT   WAS UNABLE TO  GET .[name]

## 2016-11-28 NOTE — Telephone Encounter (Signed)
[facility] pharmacy left a msg on the refill vm stating that the patient was there expecting to pick up furosemide. Per yesterdays office visit  AVS Reports   Date/Time Report Action User  11/27/2016 4:28 PM After Visit Summary Printed Michae Kava, CMA  Patient Instructions   Medication Instructions:  1. DECREASE Aspirin to 81 mg Once daily  2. START Lasix (Furosemide) 40 mg Once daily for 3 days total.  Then, only take it Once daily as needed for leg swelling.  Call us if you are taking it once daily. 3. DECREASE Coreg (Carvedilol) to 3.125 mg Twice daily    I will send in the rx.

## 2016-11-28 NOTE — Telephone Encounter (Signed)
Make sure he knows to only take Lasix QD x 3 days, then as needed for edema (as discussed at his office visit).  Richardson Dopp, PA-C   11/28/2016 10:23 PM

## 2016-11-28 NOTE — Telephone Encounter (Signed)
New Message   Returning your call about blood work  Also he needs a water pill called in if possible

## 2016-12-02 ENCOUNTER — Telehealth: Payer: Self-pay | Admitting: Physician Assistant

## 2016-12-02 NOTE — Telephone Encounter (Signed)
New message    Pt calling about giving an update on his fluid pills. He also wants to talk about his hemoglobin.

## 2016-12-02 NOTE — Telephone Encounter (Signed)
Returned patient call.  Patient was calling to report that the furosemide has worked for his b/l lower leg edema.  He says they are back to normal size with the 3 days of medication.  He also was questioning possible reasons for the low Hgb.  I told him this is indicative of blood loss in the GI tract and this is why he is referred to the [facility] Dr.  I assured him that the GI Dr should be able to find out what is going on with the blood loss.[name]

## 2016-12-02 NOTE — Telephone Encounter (Signed)
Thank you. First step is r/o GI blood loss. If GI workup negative, then will need further testing. Richardson Dopp, PA-C   12/02/2016 4:32 PM

## 2016-12-04 ENCOUNTER — Other Ambulatory Visit: Payer: Medicare Other | Admitting: *Deleted

## 2016-12-04 DIAGNOSIS — E7800 Pure hypercholesterolemia, unspecified: Secondary | ICD-10-CM | POA: Diagnosis not present

## 2016-12-04 DIAGNOSIS — I10 Essential (primary) hypertension: Secondary | ICD-10-CM | POA: Diagnosis not present

## 2016-12-04 DIAGNOSIS — I5022 Chronic systolic (congestive) heart failure: Secondary | ICD-10-CM | POA: Diagnosis not present

## 2016-12-04 DIAGNOSIS — D649 Anemia, unspecified: Secondary | ICD-10-CM

## 2016-12-04 DIAGNOSIS — I2510 Atherosclerotic heart disease of native coronary artery without angina pectoris: Secondary | ICD-10-CM

## 2016-12-05 ENCOUNTER — Other Ambulatory Visit: Payer: Self-pay | Admitting: *Deleted

## 2016-12-05 ENCOUNTER — Other Ambulatory Visit: Payer: Medicare Other

## 2016-12-05 LAB — CBC WITH DIFFERENTIAL/PLATELET
BASOS ABS: 0 10*3/uL (ref 0.0–0.2)
Basos: 0 %
EOS (ABSOLUTE): 0.4 10*3/uL (ref 0.0–0.4)
Eos: 5 %
Hematocrit: 30.3 % — ABNORMAL LOW (ref 37.5–51.0)
Hemoglobin: 9.9 g/dL — ABNORMAL LOW (ref 13.0–17.7)
Immature Grans (Abs): 0 10*3/uL (ref 0.0–0.1)
Immature Granulocytes: 0 %
LYMPHS ABS: 2.3 10*3/uL (ref 0.7–3.1)
LYMPHS: 28 %
MCH: 26.8 pg (ref 26.6–33.0)
MCHC: 32.7 g/dL (ref 31.5–35.7)
MCV: 82 fL (ref 79–97)
MONOS ABS: 0.6 10*3/uL (ref 0.1–0.9)
Monocytes: 8 %
NEUTROS ABS: 4.9 10*3/uL (ref 1.4–7.0)
Neutrophils: 59 %
PLATELETS: 536 10*3/uL — AB (ref 150–379)
RBC: 3.7 x10E6/uL — ABNORMAL LOW (ref 4.14–5.80)
RDW: 17.7 % — AB (ref 12.3–15.4)
WBC: 8.3 10*3/uL (ref 3.4–10.8)

## 2016-12-05 LAB — VITAMIN B12: Vitamin B-12: 170 pg/mL — ABNORMAL LOW (ref 232–1245)

## 2016-12-05 LAB — IRON AND TIBC
Iron Saturation: 9 % — CL (ref 15–55)
Iron: 28 ug/dL — ABNORMAL LOW (ref 38–169)
TIBC: 323 ug/dL (ref 250–450)
UIBC: 295 ug/dL (ref 111–343)

## 2016-12-05 LAB — LIPID PANEL
CHOL/HDL RATIO: 5.7 ratio — AB (ref 0.0–5.0)
Cholesterol, Total: 216 mg/dL — ABNORMAL HIGH (ref 100–199)
HDL: 38 mg/dL — AB (ref >39)
LDL CALC: 165 mg/dL — AB (ref 0–99)
TRIGLYCERIDES: 66 mg/dL (ref 0–149)
VLDL CHOLESTEROL CAL: 13 mg/dL (ref 5–40)

## 2016-12-05 LAB — BASIC METABOLIC PANEL
BUN / CREAT RATIO: 10 (ref 9–20)
BUN: 10 mg/dL (ref 6–24)
CO2: 21 mmol/L (ref 18–29)
CREATININE: 1.02 mg/dL (ref 0.76–1.27)
Calcium: 8.8 mg/dL (ref 8.7–10.2)
Chloride: 101 mmol/L (ref 96–106)
GFR calc Af Amer: 96 mL/min/{1.73_m2} (ref >59)
GFR, EST NON AFRICAN AMERICAN: 83 mL/min/{1.73_m2} (ref >59)
Glucose: 106 mg/dL — ABNORMAL HIGH (ref 65–99)
Potassium: 4.8 mmol/L (ref 3.5–5.2)
Sodium: 140 mmol/L (ref 134–144)

## 2016-12-05 LAB — FERRITIN: Ferritin: 16 ng/mL — ABNORMAL LOW (ref 30–400)

## 2016-12-05 LAB — FOLATE: Folate: 9.5 ng/mL (ref >3.0)

## 2016-12-05 MED ORDER — ATORVASTATIN CALCIUM 20 MG PO TABS: 20.0000 mg | ORAL_TABLET | Freq: Every day | ORAL | 11 refills | Status: DC

## 2016-12-06 ENCOUNTER — Ambulatory Visit: Payer: Medicare Other | Admitting: Gastroenterology

## 2016-12-06 ENCOUNTER — Other Ambulatory Visit: Payer: Self-pay

## 2016-12-08 NOTE — Telephone Encounter (Signed)
Agree with plan as outlined by Richardson Dopp

## 2016-12-16 ENCOUNTER — Other Ambulatory Visit (HOSPITAL_COMMUNITY): Payer: Medicare Other

## 2016-12-16 ENCOUNTER — Other Ambulatory Visit: Payer: Medicare Other

## 2016-12-19 ENCOUNTER — Ambulatory Visit: Payer: Medicare Other | Admitting: Gastroenterology

## 2016-12-30 ENCOUNTER — Ambulatory Visit: Payer: Medicare Other | Admitting: Gastroenterology

## 2016-12-31 ENCOUNTER — Telehealth: Payer: Self-pay | Admitting: *Deleted

## 2016-12-31 DIAGNOSIS — D509 Iron deficiency anemia, unspecified: Secondary | ICD-10-CM

## 2016-12-31 NOTE — Telephone Encounter (Signed)
Lmtcb per [employer]. PA to go over recommendations. Per [place] PA he would like to start pt on Ferrous Sulfate 325 mg BID with repeat Hgb and Hct in 1-2 weeks. Pt does have a f/u this month with GI. Pt missed his previous appt with GI.

## 2017-01-01 ENCOUNTER — Ambulatory Visit: Payer: Medicare Other | Admitting: Physician Assistant

## 2017-01-01 ENCOUNTER — Encounter: Payer: Self-pay | Admitting: *Deleted

## 2017-01-01 DIAGNOSIS — D509 Iron deficiency anemia, unspecified: Secondary | ICD-10-CM | POA: Insufficient documentation

## 2017-01-01 DIAGNOSIS — D649 Anemia, unspecified: Secondary | ICD-10-CM

## 2017-01-01 HISTORY — DX: Anemia, unspecified: D64.9

## 2017-01-01 MED ORDER — FERROUS SULFATE 325 (65 FE) MG PO TABS: 325.0000 mg | ORAL_TABLET | Freq: Every day | ORAL | 2 refills | Status: DC

## 2017-01-01 NOTE — Telephone Encounter (Signed)
I s/w pt and went over recommendations per Brynda Rim. PA to start Ferrous Sulfate 325 mg BID, Rx sent , Hgb and HCT scheduled for 01/10/17. Pt also asked me when is he supposed to have his echo. I advised pt that it looks like he did not show for his echo that was scheduled for 12/16/16. Pt agreeable to plan of care to all instructions discussed today. Pt aware I will have [facility] call him and reschedule the echo. Pt was scheduled to see Brynda Rim. PA today though he cancelled his appt. I explained to the pt it is important that he keeps his f/u appt with Brynda Rim. PA as well as his appt with GI. Pt agreeable to plan of care to keep appts as planned.

## 2017-01-07 ENCOUNTER — Telehealth: Payer: Self-pay | Admitting: Cardiovascular Disease

## 2017-01-07 NOTE — Telephone Encounter (Signed)
Spoke with [name] and advised her pt was decreased to Carvedilol 3.125mg  BID on 2/7.  [name] appreciative for call.

## 2017-01-07 NOTE — Telephone Encounter (Signed)
New Message     Needs to verify the prescription  carvedilol (COREG) 3.125 MG tablet Take 1 tablet (3.125 mg total) by mouth 2 (two) times daily.    They have 2 prescriptions for same medication 6.25 and 3.125 , which one is the pt suppose to be taking ?

## 2017-01-10 ENCOUNTER — Other Ambulatory Visit: Payer: Medicare Other

## 2017-01-13 ENCOUNTER — Encounter: Payer: Self-pay | Admitting: Neurology

## 2017-01-13 ENCOUNTER — Ambulatory Visit (INDEPENDENT_AMBULATORY_CARE_PROVIDER_SITE_OTHER): Payer: Medicare Other | Admitting: Neurology

## 2017-01-13 VITALS — BP 145/80 | HR 75 | Wt 210.6 lb

## 2017-01-13 DIAGNOSIS — I2510 Atherosclerotic heart disease of native coronary artery without angina pectoris: Secondary | ICD-10-CM

## 2017-01-13 DIAGNOSIS — G40909 Epilepsy, unspecified, not intractable, without status epilepticus: Secondary | ICD-10-CM | POA: Diagnosis not present
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
## 2017-01-13 NOTE — Patient Instructions (Addendum)
I had a long discussion the patient with regards to his seizure disorder which appears to be stable on the current medication regime. Continue Keppra 500 mg twice daily and phenobarbital 32.4 mg 2 tablets twice daily. I advised him to be compliant with his medications and to avoid seizure provoking stimuli like sleep deprivation, medication noncompliance. He was advised to follow-up with his gastroenterologist for his anemia workup. He will return for follow-up in 1 year or call earlier if necessary

## 2017-01-14 ENCOUNTER — Ambulatory Visit: Payer: Medicare Other | Admitting: Gastroenterology

## 2017-01-16 ENCOUNTER — Telehealth: Payer: Self-pay | Admitting: Physician Assistant

## 2017-01-16 ENCOUNTER — Other Ambulatory Visit (HOSPITAL_COMMUNITY): Payer: Medicare Other

## 2017-01-16 MED ORDER — FERROUS SULFATE 325 (65 FE) MG PO TABS: 325.0000 mg | ORAL_TABLET | Freq: Every day | ORAL | 3 refills | Status: DC

## 2017-01-16 NOTE — Telephone Encounter (Signed)
New message    Pt is calling to ask if he is supposed to do blood work again when he comes in for his echo.

## 2017-01-16 NOTE — Telephone Encounter (Signed)
After chart review I discovered patient was supposed to have HGB/HCT drawn at last echo appt (after starting FeSO4).   I called and spoke to patient. I advised him that he can have labs drawn at 04/13 echo appt. I made appt and attached order.  Patient admits to not starting FeSO4 yet, rx not at pharmacy.  I advised him to start rx immediately so it would be in his system when labs are drawn.  I advised him I would be sure pharmacy has rx for FeSO4.  He voiced understanding and agreed with plan.  I contacted pt's pharmacy 3x.  All 3x there was no answer.  I dc'd the current FeSO4 rx and resent to pharmacy electronically.

## 2017-01-17 ENCOUNTER — Ambulatory Visit: Payer: Medicare Other | Admitting: Physician Assistant

## 2017-01-29 ENCOUNTER — Encounter: Payer: Self-pay | Admitting: Physician Assistant

## 2017-01-29 ENCOUNTER — Encounter (INDEPENDENT_AMBULATORY_CARE_PROVIDER_SITE_OTHER): Payer: Self-pay

## 2017-01-29 ENCOUNTER — Ambulatory Visit (INDEPENDENT_AMBULATORY_CARE_PROVIDER_SITE_OTHER): Payer: Medicare Other | Admitting: Physician Assistant

## 2017-01-29 VITALS — BP 120/78 | HR 74 | Ht 74.0 in | Wt 207.0 lb

## 2017-01-29 DIAGNOSIS — I25119 Atherosclerotic heart disease of native coronary artery with unspecified angina pectoris: Secondary | ICD-10-CM

## 2017-01-29 DIAGNOSIS — E7800 Pure hypercholesterolemia, unspecified: Secondary | ICD-10-CM | POA: Diagnosis not present

## 2017-01-29 DIAGNOSIS — I5022 Chronic systolic (congestive) heart failure: Secondary | ICD-10-CM

## 2017-01-29 DIAGNOSIS — I340 Nonrheumatic mitral (valve) insufficiency: Secondary | ICD-10-CM | POA: Diagnosis not present

## 2017-01-29 DIAGNOSIS — I10 Essential (primary) hypertension: Secondary | ICD-10-CM | POA: Diagnosis not present

## 2017-01-29 DIAGNOSIS — I209 Angina pectoris, unspecified: Secondary | ICD-10-CM

## 2017-01-29 DIAGNOSIS — F17200 Nicotine dependence, unspecified, uncomplicated: Secondary | ICD-10-CM | POA: Diagnosis not present

## 2017-01-29 DIAGNOSIS — D509 Iron deficiency anemia, unspecified: Secondary | ICD-10-CM

## 2017-01-29 MED ORDER — LISINOPRIL 2.5 MG PO TABS: 2.5000 mg | ORAL_TABLET | Freq: Every day | ORAL | 3 refills | Status: DC

## 2017-01-29 NOTE — Patient Instructions (Addendum)
Medication Instructions:  1. START LISINOPRIL 2.5 MG 1 TABLET DAILY; RX HAS BEEN SENT IN  Labwork: 2 WEEKS YOU WILL NEED BMET AND CBC 02/14/17  Testing/Procedures: NONE ORDERED  Follow-Up: Your physician wants you to follow-up in: [place] DR. Emelda Fear will receive a reminder letter in the mail two months in advance. If you don't receive a letter, please call our office to schedule the follow-up appointment.  Any Other Special Instructions Will Be Listed Below (If Applicable).  If you need a refill on your cardiac medications before your next appointment, please call your pharmacy.

## 2017-01-29 NOTE — Progress Notes (Signed)
Cardiology Office Note:    Date:  01/29/2017   ID:  [name], DOB Jul 24, 1961, MRN [id number]  PCP:  No PCP Per Patient  Cardiologist:  Dr. Sherren Mocha   Electrophysiologist:  n/a  Referring MD: No ref. provider found   Chief Complaint  Patient presents with  . Congestive Heart Failure    follow up   . Coronary Artery Disease    follow up     History of Present Illness:    [name] is a 56 y.o. male with a hx of CAD status post BMS x 2 to the prox CFX in 2009, non-STEMI 2012 due to ISR of CFX stents (off Plavix) treated with angioplasty, ischemic cardiomyopathy, HTN, HL, seizure disorder, reported "clotting disorder," tobacco abuse, noncompliance, poor dentition.  Cardiac cath in 09/2013 demonstrated patent stents and nonobstructive disease elsewhere. He was then admitted in 2016 with NSTEMI and LHC demonstrated 3V CAD with total occlusion of the RCA and LCx and mod stenosis of the LM and LAD.  He also has mod MR.  He was seen by TCTS and he declined CABG.  He was seen last in 2/18. He had significant leg edema and I gave him 3 days of Lasix.  Labs indicated that his hemoglobin had dropped and his iron levels were low. His BNP was normal.  I referred him to gastroenterology. Unfortunately, he has canceled several appointments. He is now scheduled to be seen at the end of this month. I also set him up for an echocardiogram. He has not shown or canceled for a few of these appointments. Echocardiogram is now scheduled for later this month.  He returns for follow-up. Although he has been seen here multiple times, he went to the [place] office and arrived here late today.  He is here alone.  Since last seen, he denies chest pain, shortness of breath, syncope, orthopnea, PND or significant pedal edema. He is still smoking.  He denies melena or hematochezia.   Prior CV studies:   The following studies were reviewed today:  TEE 8/16 EF 45-50, inf HK, mod MR, mild TR   LHC  8/16 LM 60 LAD mid 60, D1 75 RI 50, 80 LCx prox stent 100 RCA prox 100 EF 45-50   Echo 8/16 EF 40-45, inf-lat AK, Gr 2 diastolic dysfunction, mild AI, mod MR   Past Medical History:  Diagnosis Date  . Anemia 01/01/2017  . CAD (coronary artery disease)    a. Overlapping BMS-prox LCx in 2009 b. NSTEMI 2012 due to prox LCx ISR s/p PTCA alone; EF 45% w/ basal inf HK. Had run out of Plavix 1 week prior. c. NSTEMI 05/2015 after running out of Plavix - cath with 3V CAD, Patient left AMA prior to agreeing to CABG. // d. LHC 8/16: LM 60; LAD mid 60, D1 75; RI 50, 80; LCx prox stent 100; RCA prox 100; EF 45-50 >> seen by TCTS - declined CABG >> Med Rx.  . Chronic systolic CHF (congestive heart failure) ([place]) 06/06/2015   a - Echo 8/16:  EF 40-45, inf-lat AK, Gr 2 diastolic dysfunction, mild AI, mod MR // b - TEE 8/16: EF 45-50, inf HK, mod MR, mild TR  . Clotting disorder ([place])   . Diabetes mellitus without complication ([place])   . HLD (hyperlipidemia)   . Hyperglycemia A1C 6.0 05/2015.  [name] Hypertension   . MI (myocardial infarction) x 7  . Poor compliance   . Seizure disorder ([place])   .  Seizures ([place])   . TOBACCO ABUSE 02/22/2009  . Valvular heart disease    a. Echo 05/2015: mild AI, mod MR.    Past Surgical History:  Procedure Laterality Date  . CARDIAC CATHETERIZATION [name] 05/29/2015   Procedure: Left Heart Cath and Coronary Angiography;  Surgeon: Belva Crome, MD;  Location: [facility];  Service: Cardiovascular;  Laterality: [name];  . COLONOSCOPY    . CORONARY ANGIOPLASTY  03/2011   PTCA-prox LCx ISR  . CORONARY ANGIOPLASTY WITH STENT PLACEMENT  10/2007   BMS-prox LCx  . LEFT HEART CATHETERIZATION WITH CORONARY ANGIOGRAM [name] 10/13/2013   Procedure: LEFT HEART CATHETERIZATION WITH CORONARY ANGIOGRAM;  Surgeon: Blane Ohara, MD;  Location: [facility] CATH LAB;  Service: Cardiovascular;  Laterality: [name];  . TEE WITHOUT CARDIOVERSION [name] 06/16/2015   Procedure: TRANSESOPHAGEAL ECHOCARDIOGRAM  (TEE);  Surgeon: Josue Hector, MD;  Location: [facility] ENDOSCOPY;  Service: Cardiovascular;  Laterality: [name];    Current Medications: Current Meds  Medication Sig  . aspirin EC 81 MG tablet Take 1 tablet (81 mg total) by mouth daily.  [name] atorvastatin (LIPITOR) 20 MG tablet Take 1 tablet (20 mg total) by mouth daily.  . carvedilol (COREG) 3.125 MG tablet Take 1 tablet (3.125 mg total) by mouth 2 (two) times daily.  . clopidogrel (PLAVIX) 75 MG tablet Take 1 tablet (75 mg total) by mouth daily.  . ferrous sulfate 325 (65 FE) MG tablet Take 1 tablet (325 mg total) by mouth daily with breakfast.  . furosemide (LASIX) 40 MG tablet Take one tablet by mouth daily for three days, then take one tablet by mouth daily as needed for leg swelling.  . isosorbide mononitrate (IMDUR) 30 MG 24 hr tablet TAKE 1/2 TABLET BY MOUTH DAILY  . levETIRAcetam (KEPPRA) 500 MG tablet Take 1 tablet (500 mg total) by mouth 2 (two) times daily.  . nitroGLYCERIN (NITROSTAT) 0.4 MG SL tablet Place 1 tablet (0.4 mg total) under the tongue every 5 (five) minutes as needed for chest pain (3 dose MAX).  [name] OVER THE COUNTER MEDICATION Take 1 drop by mouth daily. BLACKSEED OIL 1 DROP BY MOUTH ONCE DAILY  . PHENobarbital (LUMINAL) 32.4 MG tablet Take 1 tablet (32.4 mg total) by mouth 4 (four) times daily.  . [DISCONTINUED] carvedilol (COREG) 6.25 MG tablet      Allergies:   Food allergy formula   Social History   Social History  . Marital status: Single    Spouse name: [name]  . Number of children: 0  . Years of education: 12th   Occupational History  . [name]    Social History Main Topics  . Smoking status: Current Every Day Smoker    Years: 35.00    Types: Cigars  . Smokeless tobacco: Never Used     Comment: smoke cigars 2 per day  . Alcohol use No  . Drug use: No  . Sexual activity: Not Currently   Other Topics Concern  . None   Social History Narrative   Patient is homeless            Family History  Problem  Relation Age of Onset  . Coronary artery disease Maternal Aunt   . Heart disease Maternal Uncle   . Stroke Maternal Uncle   . Heart attack Maternal Uncle     X3  . Hypertension       ROS:   Please see the history of present illness.    ROS All other systems reviewed  and are negative.   EKGs/Labs/Other Test Reviewed:    EKG:  EKG is  ordered today.  The ekg ordered today demonstrates NSR, HR 74, NSSTTW changes, QTc 410 ms  Recent Labs: 11/27/2016: ALT 15; NT-Pro BNP 74; TSH 1.960 12/04/2016: BUN 10; Creatinine, Ser 1.02; Platelets 536; Potassium 4.8; Sodium 140   Recent Lipid Panel    Component Value Date/Time   CHOL 216 (H) 12/04/2016 1219   TRIG 66 12/04/2016 1219   HDL 38 (L) 12/04/2016 1219   CHOLHDL 5.7 (H) 12/04/2016 1219   CHOLHDL 4.5 04/07/2011 0151   VLDL 12 04/07/2011 0151   LDLCALC 165 (H) 12/04/2016 1219     Physical Exam:    VS:  BP 120/78   Pulse 74   Ht 6\' 2"  (1.88 m)   Wt 207 lb (93.9 kg)   BMI 26.58 kg/m     Wt Readings from Last 3 Encounters:  01/29/17 207 lb (93.9 kg)  01/13/17 210 lb 9.6 oz (95.5 kg)  11/27/16 215 lb 12.8 oz (97.9 kg)     Physical Exam  Constitutional: He is oriented to person, place, and time. He appears well-developed and well-nourished. No distress.  HENT:  Head: Normocephalic and atraumatic.  Eyes: No scleral icterus.  Neck: Normal range of motion. No JVD present.  Cardiovascular: Normal rate, regular rhythm, S1 normal and S2 normal.   Pulmonary/Chest: Effort normal and breath sounds normal. He has no wheezes. He has no rhonchi. He has no rales.  Abdominal: Soft. There is no tenderness.  Musculoskeletal: He exhibits no edema.  Neurological: He is alert and oriented to person, place, and time.  Skin: Skin is warm and dry.  Psychiatric: He has a normal mood and affect.    ASSESSMENT:    1. Chronic systolic CHF (congestive heart failure) ([place])   2. Coronary artery disease involving native coronary artery of native  heart with angina pectoris ([place])   3. Essential hypertension   4. Iron deficiency anemia, unspecified iron deficiency anemia type   5. Pure hypercholesterolemia   6. Non-rheumatic mitral regurgitation   7. TOBACCO ABUSE    PLAN:    In order of problems listed above:  1. Chronic systolic CHF (congestive heart failure) (H[facility]) -  NYHA 2.  EF 45-50 by last assessment.  Continue beta-blocker, nitrates.  I will start low dose ACE inhibitor.   -  Lisinopril 2.5 QD  -  BMET 2 weeks.   2. Coronary artery disease involving native coronary artery of native heart with angina pectoris - 3v CAD treated medically.  He denies angina on his current medical regimen which includes ASA, Plavix, beta-blocker, nitrates.  Continue current rx.   3. Essential hypertension - BP controlled.   4. Iron deficiency anemia, unspecified iron deficiency anemia type -  I have encouraged him to go to the GI appt the month. Continue Iron.  Check CBC in 2 weeks.  5. Pure hypercholesterolemia - Continue statin.   6. Non-rheumatic mitral regurgitation - FU echo pending.  7. Tobacco abuse -  We again discussed the importance of quitting.     Dispo:  Return in about 6 months (around 07/31/2017) for Routine Follow Up, w/ Dr. Burt Knack.   Medication Adjustments/Labs and Tests Ordered: Current medicines are reviewed at length with the patient today.  Concerns regarding medicines are outlined above.  Medication changes, Labs and Tests ordered today are outlined in the Patient Instructions noted below. Patient Instructions  Medication Instructions:  1. START LISINOPRIL 2.5  MG 1 TABLET DAILY; RX HAS BEEN SENT IN  Labwork: 2 WEEKS YOU WILL NEED BMET AND CBC 02/14/17  Testing/Procedures: NONE ORDERED  Follow-Up: Your physician wants you to follow-up in: [place] DR. Emelda Fear will receive a reminder letter in the mail two months in advance. If you don't receive a letter, please call our office to schedule the  follow-up appointment.  Any Other Special Instructions Will Be Listed Below (If Applicable).  If you need a refill on your cardiac medications before your next appointment, please call your pharmacy.  Signed, Richardson Dopp, PA-C  01/29/2017 4:26 PM    [facility] [place], [place], [place]  [address] Phone: [phone]; Fax: [phone]

## 2017-01-31 ENCOUNTER — Ambulatory Visit (HOSPITAL_COMMUNITY): Payer: Medicare Other | Attending: Cardiovascular Disease

## 2017-01-31 ENCOUNTER — Other Ambulatory Visit: Payer: Self-pay

## 2017-01-31 ENCOUNTER — Other Ambulatory Visit: Payer: Medicare Other

## 2017-01-31 DIAGNOSIS — Z720 Tobacco use: Secondary | ICD-10-CM | POA: Insufficient documentation

## 2017-01-31 DIAGNOSIS — I5021 Acute systolic (congestive) heart failure: Secondary | ICD-10-CM | POA: Diagnosis present

## 2017-01-31 DIAGNOSIS — I351 Nonrheumatic aortic (valve) insufficiency: Secondary | ICD-10-CM | POA: Insufficient documentation

## 2017-01-31 DIAGNOSIS — I5022 Chronic systolic (congestive) heart failure: Secondary | ICD-10-CM | POA: Insufficient documentation

## 2017-01-31 DIAGNOSIS — I340 Nonrheumatic mitral (valve) insufficiency: Secondary | ICD-10-CM | POA: Diagnosis not present

## 2017-01-31 DIAGNOSIS — I110 Hypertensive heart disease with heart failure: Secondary | ICD-10-CM | POA: Insufficient documentation

## 2017-01-31 DIAGNOSIS — I252 Old myocardial infarction: Secondary | ICD-10-CM | POA: Diagnosis not present

## 2017-01-31 DIAGNOSIS — I2510 Atherosclerotic heart disease of native coronary artery without angina pectoris: Secondary | ICD-10-CM | POA: Diagnosis not present

## 2017-02-02 ENCOUNTER — Encounter: Payer: Self-pay | Admitting: Physician Assistant

## 2017-02-03 ENCOUNTER — Telehealth: Payer: Self-pay | Admitting: *Deleted

## 2017-02-03 NOTE — Telephone Encounter (Signed)
Lmtcb to go over echo results and findings.

## 2017-02-03 NOTE — Telephone Encounter (Signed)
-----   Message from Liliane Shi, [place] sent at 02/02/2017  7:33 PM EDT ----- Please call the patient. The echocardiogram shows stable heart function (ejection fraction is 40-45%), moderately impaired relaxation, mild to mod leakage of the mitral valve (mitral regurgitation). No significant change since last echocardiogram in 2016. Continue current treatment plan.  Please fax a copy of this study result to his PCP:  No PCP Per Patient  Thanks! Richardson Dopp, PA-C    02/02/2017 7:29 PM

## 2017-02-04 NOTE — Telephone Encounter (Signed)
-----   Message from Liliane Shi, [place] sent at 02/02/2017  7:33 PM EDT ----- Please call the patient. The echocardiogram shows stable heart function (ejection fraction is 40-45%), moderately impaired relaxation, mild to mod leakage of the mitral valve (mitral regurgitation). No significant change since last echocardiogram in 2016. Continue current treatment plan.  Please fax a copy of this study result to his PCP:  No PCP Per Patient  Thanks! Richardson Dopp, PA-C    02/02/2017 7:29 PM

## 2017-02-04 NOTE — Telephone Encounter (Signed)
Lmtcb x 2 to go over echo results.  

## 2017-02-12 ENCOUNTER — Encounter: Payer: Self-pay | Admitting: *Deleted

## 2017-02-12 NOTE — Telephone Encounter (Signed)
Lmtcb x 3 to go over echo results. I will send out letter for ptcb to go over results.

## 2017-02-12 NOTE — Telephone Encounter (Signed)
-----   Message from Liliane Shi, [place] sent at 02/02/2017  7:33 PM EDT ----- Please call the patient. The echocardiogram shows stable heart function (ejection fraction is 40-45%), moderately impaired relaxation, mild to mod leakage of the mitral valve (mitral regurgitation). No significant change since last echocardiogram in 2016. Continue current treatment plan.  Please fax a copy of this study result to his PCP:  No PCP Per Patient  Thanks! Richardson Dopp, PA-C    02/02/2017 7:29 PM

## 2017-02-14 ENCOUNTER — Other Ambulatory Visit: Payer: Medicare Other

## 2017-02-17 ENCOUNTER — Ambulatory Visit (INDEPENDENT_AMBULATORY_CARE_PROVIDER_SITE_OTHER): Payer: Medicare Other | Admitting: Gastroenterology

## 2017-02-17 ENCOUNTER — Other Ambulatory Visit (INDEPENDENT_AMBULATORY_CARE_PROVIDER_SITE_OTHER): Payer: Medicare Other

## 2017-02-17 ENCOUNTER — Encounter: Payer: Self-pay | Admitting: Gastroenterology

## 2017-02-17 VITALS — BP 102/54 | HR 72 | Ht 73.5 in | Wt 201.2 lb

## 2017-02-17 DIAGNOSIS — G40909 Epilepsy, unspecified, not intractable, without status epilepticus: Secondary | ICD-10-CM

## 2017-02-17 DIAGNOSIS — D509 Iron deficiency anemia, unspecified: Secondary | ICD-10-CM

## 2017-02-17 DIAGNOSIS — Z91199 Patient's noncompliance with other medical treatment and regimen due to unspecified reason: Secondary | ICD-10-CM

## 2017-02-17 DIAGNOSIS — Z9119 Patient's noncompliance with other medical treatment and regimen: Secondary | ICD-10-CM

## 2017-02-17 DIAGNOSIS — I2510 Atherosclerotic heart disease of native coronary artery without angina pectoris: Secondary | ICD-10-CM

## 2017-02-17 LAB — CBC WITH DIFFERENTIAL/PLATELET
Basophils Absolute: 0.1 10*3/uL (ref 0.0–0.1)
Basophils Relative: 1.6 % (ref 0.0–3.0)
EOS PCT: 4 % (ref 0.0–5.0)
Eosinophils Absolute: 0.3 10*3/uL (ref 0.0–0.7)
HEMATOCRIT: 34.2 % — AB (ref 39.0–52.0)
Hemoglobin: 11.6 g/dL — ABNORMAL LOW (ref 13.0–17.0)
LYMPHS ABS: 2 10*3/uL (ref 0.7–4.0)
LYMPHS PCT: 31.7 % (ref 12.0–46.0)
MCHC: 33.9 g/dL (ref 30.0–36.0)
MCV: 84.3 fl (ref 78.0–100.0)
MONOS PCT: 8.8 % (ref 3.0–12.0)
Monocytes Absolute: 0.6 10*3/uL (ref 0.1–1.0)
NEUTROS ABS: 3.5 10*3/uL (ref 1.4–7.7)
Neutrophils Relative %: 53.9 % (ref 43.0–77.0)
PLATELETS: 435 10*3/uL — AB (ref 150.0–400.0)
RBC: 4.06 Mil/uL — ABNORMAL LOW (ref 4.22–5.81)
RDW: 18.9 % — ABNORMAL HIGH (ref 11.5–15.5)
WBC: 6.5 10*3/uL (ref 4.0–10.5)

## 2017-02-17 LAB — IBC PANEL
Iron: 24 ug/dL — ABNORMAL LOW (ref 42–165)
SATURATION RATIOS: 6.1 % — AB (ref 20.0–50.0)
Transferrin: 281 mg/dL (ref 212.0–360.0)

## 2017-02-17 LAB — FERRITIN: FERRITIN: 25.2 ng/mL (ref 22.0–322.0)

## 2017-02-17 NOTE — Patient Instructions (Signed)
If you are age 55 or older, your body mass index should be between 23-30. Your Body mass index is 26.19 kg/m. If this is out of the aforementioned range listed, please consider follow up with your Primary Care Provider.  If you are age 11 or younger, your body mass index should be between 19-25. Your Body mass index is 26.19 kg/m. If this is out of the aformentioned range listed, please consider follow up with your Primary Care Provider.   Your physician has requested that you go to the basement for lab work before leaving today.  Thank you for choosing [facility]  Dr Wilfrid Lund III

## 2017-02-17 NOTE — Progress Notes (Signed)
[facility] Consult Note:  History: [name] 02/17/2017  Referring physician: Mauricio Myers, [place]  Reason for consult/chief complaint: Anemia (Pt denies any GI problems or complaints)   Subjective  HPI:  This is a 56 year old man referred by cardiology clinic over 2 months ago when he was found to have iron deficiency anemia. Hemoglobin had been 11.3 in September 2017, and it dropped down to 9.8 and a low ferritin. He did not show for clinic visit on 12/06/2016: And then canceled 3 subsequent visits in March. I reviewed the cardiology note from 411, outlining this patient's extensive noncompliance. Most notably, the patient was found to have severe three-vessel coronary disease in 2016 after an MI, and then declined CABG. He has a somewhat odd affect and is occasionally evasive about answering questions. Sounds as if he may have chest pain that is difficult to characterize at times, but has not taken a nitroglycerin tablet in years. [name] denies abdominal pain, rectal bleeding, dysphagia, odynophagia, early satiety, nausea, vomiting or weight loss. He had a normal colonoscopy with Dr. Deatra Ina in November 2014. An apparent diminutive polyp was normal colon tissue on pathology specimen.   ROS:  Review of Systems  Constitutional: Negative for appetite change and unexpected weight change.  HENT: Negative for mouth sores and voice change.   Eyes: Negative for pain and redness.  Respiratory: Negative for cough and shortness of breath.   Cardiovascular: Positive for chest pain. Negative for palpitations.  Genitourinary: Negative for dysuria and hematuria.  Musculoskeletal: Negative for arthralgias and myalgias.  Skin: Negative for pallor and rash.  Neurological: Negative for weakness and headaches.  Hematological: Negative for adenopathy.     Past Medical History: Past Medical History:  Diagnosis Date  . Anemia 01/01/2017  . CAD (coronary artery disease)      a. Overlapping BMS-prox LCx in 2009 b. NSTEMI 2012 due to prox LCx ISR s/p PTCA alone; EF 45% w/ basal inf HK. Had run out of Plavix 1 week prior. c. NSTEMI 05/2015 after running out of Plavix - cath with 3V CAD, Patient left AMA prior to agreeing to CABG. // d. LHC 8/16: LM 60; LAD mid 60, D1 75; RI 50, 80; LCx prox stent 100; RCA prox 100; EF 45-50 >> seen by TCTS - declined CABG >> Med Rx.  . Chronic combined systolic and diastolic CHF (congestive heart failure) ([place]) 06/06/2015   a - Echo 8/16:  EF 40-45, inf-lat AK, Gr 2 diastolic dysfunction, mild AI, mod MR // b - TEE 8/16: EF 45-50, inf HK, mod MR, mild TR // c - Echo 4/18: EF 40-45, ant-lat and inf-lat HK, Gr 2 DD, mild AI, mild to mod MR, severe LAE, PASP 29   . Clotting disorder ([place])   . Diabetes mellitus without complication ([place])   . HLD (hyperlipidemia)   . Hyperglycemia A1C 6.0 05/2015.  [name] Hypertension   . MI (myocardial infarction) ([place]) x 7  . Poor compliance   . Seizure disorder ([place])   . Seizures ([place])   . TOBACCO ABUSE 02/22/2009  . Valvular heart disease    a. Echo 05/2015: mild AI, mod MR.     Past Surgical History: Past Surgical History:  Procedure Laterality Date  . CARDIAC CATHETERIZATION [name] 05/29/2015   Procedure: Left Heart Cath and Coronary Angiography;  Surgeon: Belva Crome, MD;  Location: [facility] CV LAB;  Service: Cardiovascular;  Laterality: [name];  . COLONOSCOPY    . CORONARY ANGIOPLASTY  03/2011  PTCA-prox LCx ISR  . CORONARY ANGIOPLASTY WITH STENT PLACEMENT  10/2007   BMS-prox LCx  . LEFT HEART CATHETERIZATION WITH CORONARY ANGIOGRAM [name] 10/13/2013   Procedure: LEFT HEART CATHETERIZATION WITH CORONARY ANGIOGRAM;  Surgeon: Blane Ohara, MD;  Location: [facility] CATH LAB;  Service: Cardiovascular;  Laterality: [name];  . TEE WITHOUT CARDIOVERSION [name] 06/16/2015   Procedure: TRANSESOPHAGEAL ECHOCARDIOGRAM (TEE);  Surgeon: Josue Hector, MD;  Location: [facility] ENDOSCOPY;  Service: Cardiovascular;  Laterality: [name];      Family History: Family History  Problem Relation Age of Onset  . Coronary artery disease Maternal Aunt   . Heart disease Maternal Uncle   . Stroke Maternal Uncle   . Heart attack Maternal Uncle     X3  . Hypertension    . Colon cancer Neg Hx   . Stomach cancer Neg Hx   . Rectal cancer Neg Hx   . Pancreatic cancer Neg Hx     Social History: Social History   Social History  . Marital status: Single    Spouse name: [name]  . Number of children: 0  . Years of education: 12th   Occupational History  . former Dealer    Social History Main Topics  . Smoking status: Current Every Day Smoker    Years: 35.00    Types: Cigars  . Smokeless tobacco: Never Used     Comment: smoke cigars 2 per day  . Alcohol use No  . Drug use: No  . Sexual activity: Not Currently   Other Topics Concern  . None   Social History Narrative   Patient is homeless           Allergies: Allergies  Allergen Reactions  . Food Allergy Formula Other (See Comments)    [place] >>> SEIZURES    Outpatient Meds: Current Outpatient Prescriptions  Medication Sig Dispense Refill  . AMBULATORY NON FORMULARY MEDICATION Medication Name: Black seed oil    . aspirin EC 81 MG tablet Take 1 tablet (81 mg total) by mouth daily.    [name] atorvastatin (LIPITOR) 20 MG tablet Take 1 tablet (20 mg total) by mouth daily. 30 tablet 11  . carvedilol (COREG) 3.125 MG tablet Take 1 tablet (3.125 mg total) by mouth 2 (two) times daily. 180 tablet 3  . clopidogrel (PLAVIX) 75 MG tablet Take 1 tablet (75 mg total) by mouth daily. 90 tablet 2  . ferrous sulfate 325 (65 FE) MG tablet Take 1 tablet (325 mg total) by mouth daily with breakfast. 90 tablet 3  . isosorbide mononitrate (IMDUR) 30 MG 24 hr tablet TAKE 1/2 TABLET BY MOUTH DAILY 15 tablet 6  . levETIRAcetam (KEPPRA) 500 MG tablet Take 1 tablet (500 mg total) by mouth 2 (two) times daily. 60 tablet 5  . lisinopril (PRINIVIL,ZESTRIL) 2.5 MG tablet Take 1 tablet (2.5 mg  total) by mouth daily. 90 tablet 3  . nitroGLYCERIN (NITROSTAT) 0.4 MG SL tablet Place 1 tablet (0.4 mg total) under the tongue every 5 (five) minutes as needed for chest pain (3 dose MAX). 25 tablet 3  . OVER THE COUNTER MEDICATION Take 1 drop by mouth daily. BLACKSEED OIL 1 DROP BY MOUTH ONCE DAILY    . PHENobarbital (LUMINAL) 32.4 MG tablet Take 1 tablet (32.4 mg total) by mouth 4 (four) times daily. 120 tablet 5   No current facility-administered medications for this visit.       ___________________________________________________________________ Objective   Exam:  BP (!) 102/54   Pulse 72  Ht 6' 1.5" (1.867 m)   Wt 201 lb 4 oz (91.3 kg)   BMI 26.19 kg/m    General: this is a(n) well-appearing middle-aged man   Eyes: sclera anicteric, no redness  ENT: oral mucosa moist without lesions, no cervical or supraclavicular lymphadenopathy, good dentition  CV: RRR without murmur, S1/S2, no JVD, no peripheral edema  Resp: clear to auscultation bilaterally, normal RR and effort noted  GI: soft, no tenderness, with active bowel sounds. No guarding or palpable organomegaly noted.  Skin; warm and dry, no rash or jaundice noted  Neuro: awake, alert and oriented x 3. Normal gross motor function and fluent speech He declined a rectal exam today., stating " I don't have a rectal tumor"  Labs: CBC Latest Ref Rng & Units 02/17/2017 12/04/2016 11/27/2016  WBC 4.0 - 10.5 K/uL 6.5 8.3 7.9  Hemoglobin 13.0 - 17.0 g/dL 11.6(L) - -  Hematocrit 39.0 - 52.0 % 34.2(L) 30.3(L) 29.4(L)  Platelets 150.0 - 400.0 K/uL 435.0(H) 536(H) 421(H)   Ferritin 16 on 12/04/2016 Vitamin B12 is also low at 333 and folic acid normal at 9.5  Assessment: Encounter Diagnoses  Name Primary?  . Iron deficiency anemia, unspecified iron deficiency anemia type Yes  . Coronary artery disease involving native coronary artery of native heart without angina pectoris   . Seizure disorder ([place])   . H/O noncompliance  with medical treatment, presenting hazards to health     Iron deficiency anemia with no overt GI blood loss. Unfortunately, he declined a rectal exam today. It is possible but unlikely that he developed a colon malignancy since his colonoscopy in November 2014. I really cannot get a handle on his possible angina symptoms because he is a reluctant historian. I also do not know what his current hemoglobin is. Therefore, I think he is too high risk for the necessary sedation for an upper endoscopy at this time. I suggested empiric use of once daily omeprazole, but he is not want to take anymore medicines. He has an appointment at the end of this week for a new PCP he'll about her clinic downstairs from [place], and I strongly encouraged him to keep that. In anticipation of that, I have sent him to the lab today to check CBC and iron studies. Perhaps his new PCP can get him started on B12 as well. I would then like to be Updated on his clinical condition.  Thank you for the courtesy of this consult.  Please call me with any questions or concerns.  Nelida Meuse III  CC: Kevin Po, FNP

## 2017-02-21 ENCOUNTER — Ambulatory Visit: Payer: Medicare Other | Admitting: Family

## 2017-02-25 ENCOUNTER — Telehealth: Payer: Self-pay

## 2017-02-25 NOTE — Telephone Encounter (Signed)
Rn call patient that the medical form for DMV is dated 12/04/2010. Rn stated he needs to get a updated DMV form dated 2018. PT stated he went to [facility] and that's what they gave him. Rn gave patient the [facility] phone number listed on form at [phone]. Rn stated the form is due 30 days after its mail to the patient. Pt would like the form dated 12/04/2016 sent to him in the mail. Rn will mail back the old forms. Pt verbalized understanding.

## 2017-02-28 NOTE — Telephone Encounter (Signed)
DMV form completed for patient. Payment is needed for 50.00 dollars to release forms. Sent to medical records.

## 2017-03-04 NOTE — Telephone Encounter (Signed)
DMV forms sent to medical records. Patient needs to make payment of 50.00 for forms.

## 2017-03-07 ENCOUNTER — Other Ambulatory Visit: Payer: Self-pay | Admitting: Neurology

## 2017-03-12 ENCOUNTER — Telehealth: Payer: Self-pay | Admitting: *Deleted

## 2017-03-12 NOTE — Telephone Encounter (Signed)
Per front desk patient paid 50.00 dollars for dmv form.

## 2017-03-12 NOTE — Telephone Encounter (Signed)
Form tax to [facility] medical review board at [name] [phone].

## 2017-03-12 NOTE — Telephone Encounter (Signed)
Pt DMV form ready for pickup. I was unable to reach the patient.

## 2017-03-13 ENCOUNTER — Ambulatory Visit (INDEPENDENT_AMBULATORY_CARE_PROVIDER_SITE_OTHER): Payer: Medicare Other | Admitting: Family

## 2017-03-13 ENCOUNTER — Encounter: Payer: Self-pay | Admitting: Family

## 2017-03-13 VITALS — BP 124/88 | HR 77 | Temp 99.1°F | Resp 16 | Ht 73.5 in | Wt 198.0 lb

## 2017-03-13 DIAGNOSIS — I5022 Chronic systolic (congestive) heart failure: Secondary | ICD-10-CM

## 2017-03-13 DIAGNOSIS — I25119 Atherosclerotic heart disease of native coronary artery with unspecified angina pectoris: Secondary | ICD-10-CM

## 2017-03-13 DIAGNOSIS — F17200 Nicotine dependence, unspecified, uncomplicated: Secondary | ICD-10-CM

## 2017-03-13 DIAGNOSIS — I10 Essential (primary) hypertension: Secondary | ICD-10-CM

## 2017-03-13 DIAGNOSIS — D509 Iron deficiency anemia, unspecified: Secondary | ICD-10-CM

## 2017-03-13 DIAGNOSIS — G40909 Epilepsy, unspecified, not intractable, without status epilepticus: Secondary | ICD-10-CM | POA: Diagnosis not present

## 2017-03-13 DIAGNOSIS — Z0289 Encounter for other administrative examinations: Secondary | ICD-10-CM

## 2017-03-13 NOTE — Patient Instructions (Addendum)
Thank you for choosing [facility].  SUMMARY AND INSTRUCTIONS:  Please continue to take your medications as prescribed.  Schedule a time for a physical at your convenience.   Continue to follow up with Dr. Burt Knack and Dr. Leonie Man as scheduled.   Follow up:  If your symptoms worsen or fail to improve, please contact our office for further instruction, or in case of emergency go directly to the emergency room at the closest medical facility.

## 2017-03-13 NOTE — Progress Notes (Signed)
Subjective:    Patient ID: [name], male    DOB: 04-29-1961, 56 y.o.   MRN: [id number]  Chief Complaint  Patient presents with  . Establish Care    HPI:  ROSHAWN Myers is a 55 y.o. male who  has a past medical history of Anemia (01/01/2017); CAD (coronary artery disease); Chronic combined systolic and diastolic CHF (congestive heart failure) ([place]) (06/06/2015); Clotting disorder ([place]); Diabetes mellitus without complication ([place]); HLD (hyperlipidemia); Hyperglycemia (A1C 6.0 05/2015.); Hypertension; MI (myocardial infarction) ([place]) (x 7); Poor compliance; Seizure disorder ([place]); Seizures ([place]); TOBACCO ABUSE (02/22/2009); and Valvular heart disease. and presents today for an office visit to establish care.   1.) Hypertension - Currently maintained on carvedilol, isosorbide mononitrate, and lisinopril. Reports taking the medications as prescribed and denies adverse side effects or hypotensive readings. Blood pressures at home have remain well controlled. Denies changes in vision, worst headache of life or new symptoms of end organ damage. Working on following a low sodium diet. Physical activity level   BP Readings from Last 3 Encounters:  03/13/17 124/88  02/17/17 (!) 102/54  01/29/17 120/78   2.) Seizures - Currently maintained on phenobaritibiol and Keppra. Reports taking the medications as prescribed and denies adverse side effects. No recent seizures. Managed by Dr. Leonie Man of [facility].     3.) Iron deficiency anemia - Currently maintained on ferrous sulfate. Reports taking the medication as prescribed and denies adverse side effects. No current shortness of breath or fatigue.   Lab Results  Component Value Date   IRON 24 (L) 02/17/2017   TIBC 323 12/04/2016   FERRITIN 25.2 02/17/2017      Allergies  Allergen Reactions  . Food Allergy Formula Other (See Comments)    [place] >>> SEIZURES      Outpatient Medications Prior to Visit  Medication Sig Dispense  Refill  . AMBULATORY NON FORMULARY MEDICATION Medication Name: Black seed oil    . aspirin EC 81 MG tablet Take 1 tablet (81 mg total) by mouth daily.    [name] atorvastatin (LIPITOR) 20 MG tablet Take 1 tablet (20 mg total) by mouth daily. 30 tablet 11  . clopidogrel (PLAVIX) 75 MG tablet Take 1 tablet (75 mg total) by mouth daily. 90 tablet 2  . ferrous sulfate 325 (65 FE) MG tablet Take 1 tablet (325 mg total) by mouth daily with breakfast. 90 tablet 3  . isosorbide mononitrate (IMDUR) 30 MG 24 hr tablet TAKE 1/2 TABLET BY MOUTH DAILY 15 tablet 6  . levETIRAcetam (KEPPRA) 500 MG tablet take 1 tablet by mouth twice a day 60 tablet 5  . lisinopril (PRINIVIL,ZESTRIL) 2.5 MG tablet Take 1 tablet (2.5 mg total) by mouth daily. 90 tablet 3  . nitroGLYCERIN (NITROSTAT) 0.4 MG SL tablet Place 1 tablet (0.4 mg total) under the tongue every 5 (five) minutes as needed for chest pain (3 dose MAX). 25 tablet 3  . OVER THE COUNTER MEDICATION Take 1 drop by mouth daily. BLACKSEED OIL 1 DROP BY MOUTH ONCE DAILY    . PHENobarbital (LUMINAL) 32.4 MG tablet Take 1 tablet (32.4 mg total) by mouth 4 (four) times daily. 120 tablet 5  . carvedilol (COREG) 3.125 MG tablet Take 1 tablet (3.125 mg total) by mouth 2 (two) times daily. 180 tablet 3   No facility-administered medications prior to visit.      Past Medical History:  Diagnosis Date  . Anemia 01/01/2017  . CAD (coronary artery disease)    a.  Overlapping BMS-prox LCx in 2009 b. NSTEMI 2012 due to prox LCx ISR s/p PTCA alone; EF 45% w/ basal inf HK. Had run out of Plavix 1 week prior. c. NSTEMI 05/2015 after running out of Plavix - cath with 3V CAD, Patient left AMA prior to agreeing to CABG. // d. LHC 8/16: LM 60; LAD mid 60, D1 75; RI 50, 80; LCx prox stent 100; RCA prox 100; EF 45-50 >> seen by TCTS - declined CABG >> Med Rx.  . Chronic combined systolic and diastolic CHF (congestive heart failure) ([place]) 06/06/2015   a - Echo 8/16:  EF 40-45, inf-lat AK, Gr 2  diastolic dysfunction, mild AI, mod MR // b - TEE 8/16: EF 45-50, inf HK, mod MR, mild TR // c - Echo 4/18: EF 40-45, ant-lat and inf-lat HK, Gr 2 DD, mild AI, mild to mod MR, severe LAE, PASP 29   . Clotting disorder ([place])   . Diabetes mellitus without complication ([place])   . HLD (hyperlipidemia)   . Hyperglycemia A1C 6.0 05/2015.  [name] Hypertension   . MI (myocardial infarction) ([place]) x 7  . Poor compliance   . Seizure disorder ([place])   . Seizures ([place])   . TOBACCO ABUSE 02/22/2009  . Valvular heart disease    a. Echo 05/2015: mild AI, mod MR.      Past Surgical History:  Procedure Laterality Date  . CARDIAC CATHETERIZATION [name] 05/29/2015   Procedure: Left Heart Cath and Coronary Angiography;  Surgeon: Belva Crome, MD;  Location: [place] CV LAB;  Service: Cardiovascular;  Laterality: [name];  . COLONOSCOPY    . CORONARY ANGIOPLASTY  03/2011   PTCA-prox LCx ISR  . CORONARY ANGIOPLASTY WITH STENT PLACEMENT  10/2007   BMS-prox LCx  . LEFT HEART CATHETERIZATION WITH CORONARY ANGIOGRAM [name] 10/13/2013   Procedure: LEFT HEART CATHETERIZATION WITH CORONARY ANGIOGRAM;  Surgeon: Blane Ohara, MD;  Location[facility] CATH LAB;  Service: Cardiovascular;  Laterality: [name];  . TEE WITHOUT CARDIOVERSION [name] 06/16/2015   Procedure: TRANSESOPHAGEAL ECHOCARDIOGRAM (TEE);  Surgeon: Josue Hector, MD;  Location: [facility] ENDOSCOPY;  Service: Cardiovascular;  Laterality: [name];      Family History  Problem Relation Age of Onset  . Coronary artery disease Maternal Aunt   . Heart disease Maternal Uncle   . Stroke Maternal Uncle   . Heart attack Maternal Uncle        X3  . Hypertension Unknown   . Colon cancer Neg Hx   . Stomach cancer Neg Hx   . Rectal cancer Neg Hx   . Pancreatic cancer Neg Hx       Social History   Social History  . Marital status: Single    Spouse name: [name]  . Number of children: 0  . Years of education: 12th   Occupational History  . former Dealer    Social History Main  Topics  . Smoking status: Current Some Day Smoker    Years: 35.00    Types: Cigars  . Smokeless tobacco: Never Used     Comment: smoke cigars 2 per day  . Alcohol use No  . Drug use: No  . Sexual activity: Not Currently   Other Topics Concern  . Not on file   Social History Narrative   Fun/Hobby: Likes the lake or ocean.       Review of Systems  Constitutional: Negative for chills and fever.  Eyes:       Negative for changes in vision  Respiratory: Negative for cough, chest tightness and wheezing.   Cardiovascular: Negative for chest pain, palpitations and leg swelling.  Neurological: Negative for dizziness, tremors, seizures, syncope, weakness and light-headedness.       Objective:    BP 124/88 (BP Location: Left Arm, Patient Position: Sitting, Cuff Size: Large)   Pulse 77   Temp 99.1 F (37.3 C) (Oral)   Resp 16   Ht 6' 1.5" (1.867 m)   Wt 198 lb (89.8 kg)   SpO2 98%   BMI 25.77 kg/m  Nursing note and vital signs reviewed.  Physical Exam  Constitutional: He is oriented to person, place, and time. He appears well-developed and well-nourished. No distress.  Cardiovascular: Normal rate, regular rhythm, normal heart sounds and intact distal pulses.   Pulmonary/Chest: Effort normal and breath sounds normal.  Neurological: He is alert and oriented to person, place, and time.  Skin: Skin is warm and dry.  Psychiatric: He has a normal mood and affect. His behavior is normal. Judgment and thought content normal.        Assessment & Plan:   Problem List Items Addressed This Visit      Cardiovascular and Mediastinum   Essential hypertension    Hypertension appears adequately controlled with current medication regimen and no adverse side effects. Denies worse headache of life with no symptoms of end organ damage noted on physical exam. Continue current dosage of lisinopril, isosorbide mononitrate, and carvedilol.      Chronic systolic CHF (congestive heart  failure) (HCC)    Stable with no current exacerbation and managed by cardiology. Maintained on carvedilol to reduce risk percent cardiac death. Maintained on lisinopril for remodeling reduction.        Nervous and Auditory   Seizure disorder (HCC) - Primary    Seizures appear adequately controlled with current medication regimen and no adverse side effects. Continue current dosage of Keppra and phenobarbital. Follow-up and changes per neurology.        Other   TOBACCO ABUSE    Continues to smoke cigars on occasion. Encouraged tobacco cessation to reduce risk for cardiovascular, respiratory, and malignancies in the future. Considering tobacco cessation. Continue to monitor.      Iron deficiency anemia    Iron deficiency anemia appears adequately controlled with current medication regimen and no symptoms of shortness of breath or fatigue. Continue current dosage of ferrous sulfate. Does have follow-up with gastroenterology. Continue to monitor.          I am having Mr. Auletta maintain his nitroGLYCERIN, PHENobarbital, isosorbide mononitrate, clopidogrel, OVER THE COUNTER MEDICATION, aspirin EC, carvedilol, atorvastatin, ferrous sulfate, lisinopril, AMBULATORY NON FORMULARY MEDICATION, and levETIRAcetam.   Follow-up: Return in about 3 months (around 06/13/2017), or if symptoms worsen or fail to improve.  Mauricio Po, FNP

## 2017-03-13 NOTE — Assessment & Plan Note (Signed)
Seizures appear adequately controlled with current medication regimen and no adverse side effects. Continue current dosage of Keppra and phenobarbital. Follow-up and changes per neurology.

## 2017-03-13 NOTE — Assessment & Plan Note (Signed)
Hypertension appears adequately controlled with current medication regimen and no adverse side effects. Denies worse headache of life with no symptoms of end organ damage noted on physical exam. Continue current dosage of lisinopril, isosorbide mononitrate, and carvedilol.

## 2017-03-13 NOTE — Assessment & Plan Note (Signed)
Stable with no current exacerbation and managed by cardiology. Maintained on carvedilol to reduce risk percent cardiac death. Maintained on lisinopril for remodeling reduction.

## 2017-03-13 NOTE — Assessment & Plan Note (Signed)
Continues to smoke cigars on occasion. Encouraged tobacco cessation to reduce risk for cardiovascular, respiratory, and malignancies in the future. Considering tobacco cessation. Continue to monitor.

## 2017-03-13 NOTE — Assessment & Plan Note (Signed)
Iron deficiency anemia appears adequately controlled with current medication regimen and no symptoms of shortness of breath or fatigue. Continue current dosage of ferrous sulfate. Does have follow-up with gastroenterology. Continue to monitor.

## 2017-03-23 ENCOUNTER — Other Ambulatory Visit: Payer: Self-pay | Admitting: Neurology

## 2017-04-15 ENCOUNTER — Ambulatory Visit: Payer: Medicare Other | Admitting: Gastroenterology

## 2017-04-21 ENCOUNTER — Other Ambulatory Visit: Payer: Self-pay | Admitting: Neurology

## 2017-06-10 ENCOUNTER — Ambulatory Visit: Payer: Medicare Other | Admitting: Gastroenterology

## 2017-06-16 ENCOUNTER — Ambulatory Visit: Payer: Medicare Other | Admitting: Family

## 2017-06-24 NOTE — Progress Notes (Signed)
Pre visit review using our clinic review tool, if applicable. No additional management support is needed unless otherwise documented below in the visit note. 

## 2017-06-24 NOTE — Progress Notes (Addendum)
Subjective:   [name] is a 56 y.o. male who presents for Medicare Annual/Subsequent preventive examination.  Patient c/o mouth swelling and states he feels he may have an infection.   Review of Systems:  No ROS.  Medicare Wellness Visit. Additional risk factors are reflected in the social history.    Sleep patterns: feels rested on waking, gets up 1 times nightly to void and sleeps 7-8 hours nightly.    Home Safety/Smoke Alarms: Feels safe in home. Smoke alarms in place.  Living environment; residence and Firearm Safety: 1-story house/ trailer, no firearms.Lives with family, no needs for DME, good support system Seat Belt Safety/Bike Helmet: Wears seat belt.     Objective:    Vitals: There were no vitals taken for this visit.  There is no height or weight on file to calculate BMI.  Tobacco History  Smoking Status  . Current Some Day Smoker  . Years: 35.00  . Types: Cigars  Smokeless Tobacco  . Never Used    Comment: smoke cigars 2 per day     Ready to quit: Not Answered Counseling given: Not Answered   Past Medical History:  Diagnosis Date  . Anemia 01/01/2017  . CAD (coronary artery disease)    a. Overlapping BMS-prox LCx in 2009 b. NSTEMI 2012 due to prox LCx ISR s/p PTCA alone; EF 45% w/ basal inf HK. Had run out of Plavix 1 week prior. c. NSTEMI 05/2015 after running out of Plavix - cath with 3V CAD, Patient left AMA prior to agreeing to CABG. // d. LHC 8/16: LM 60; LAD mid 60, D1 75; RI 50, 80; LCx prox stent 100; RCA prox 100; EF 45-50 >> seen by TCTS - declined CABG >> Med Rx.  . Chronic combined systolic and diastolic CHF (congestive heart failure) ([place]) 06/06/2015   a - Echo 8/16:  EF 40-45, inf-lat AK, Gr 2 diastolic dysfunction, mild AI, mod MR // b - TEE 8/16: EF 45-50, inf HK, mod MR, mild TR // c - Echo 4/18: EF 40-45, ant-lat and inf-lat HK, Gr 2 DD, mild AI, mild to mod MR, severe LAE, PASP 29   . Clotting disorder ([place])   . Diabetes mellitus  without complication ([place])   . HLD (hyperlipidemia)   . Hyperglycemia A1C 6.0 05/2015.  [name] Hypertension   . MI (myocardial infarction) ([place]) x 7  . Poor compliance   . Seizure disorder ([place])   . Seizures ([place])   . TOBACCO ABUSE 02/22/2009  . Valvular heart disease    a. Echo 05/2015: mild AI, mod MR.   Past Surgical History:  Procedure Laterality Date  . CARDIAC CATHETERIZATION [name] 05/29/2015   Procedure: Left Heart Cath and Coronary Angiography;  Surgeon: Belva Crome, MD;  Location: [place] CV LAB;  Service: Cardiovascular;  Laterality: [name];  . COLONOSCOPY    . CORONARY ANGIOPLASTY  03/2011   PTCA-prox LCx ISR  . CORONARY ANGIOPLASTY WITH STENT PLACEMENT  10/2007   BMS-prox LCx  . LEFT HEART CATHETERIZATION WITH CORONARY ANGIOGRAM [name] 10/13/2013   Procedure: LEFT HEART CATHETERIZATION WITH CORONARY ANGIOGRAM;  Surgeon: Blane Ohara, MD;  Location: [facility] CATH LAB;  Service: Cardiovascular;  Laterality: [name];  . TEE WITHOUT CARDIOVERSION [name] 06/16/2015   Procedure: TRANSESOPHAGEAL ECHOCARDIOGRAM (TEE);  Surgeon: Josue Hector, MD;  Location: [facility] ENDOSCOPY;  Service: Cardiovascular;  Laterality: [name];   Family History  Problem Relation Age of Onset  . Coronary artery disease Maternal Aunt   .  Heart disease Maternal Uncle   . Stroke Maternal Uncle   . Heart attack Maternal Uncle        X3  . Hypertension Unknown   . Colon cancer Neg Hx   . Stomach cancer Neg Hx   . Rectal cancer Neg Hx   . Pancreatic cancer Neg Hx    History  Sexual Activity  . Sexual activity: Not Currently    Outpatient Encounter Prescriptions as of 06/25/2017  Medication Sig  . AMBULATORY NON FORMULARY MEDICATION Medication Name: Black seed oil  . aspirin EC 81 MG tablet Take 1 tablet (81 mg total) by mouth daily.  [name] atorvastatin (LIPITOR) 20 MG tablet Take 1 tablet (20 mg total) by mouth daily.  . carvedilol (COREG) 3.125 MG tablet Take 1 tablet (3.125 mg total) by mouth 2 (two) times daily.  . clopidogrel  (PLAVIX) 75 MG tablet Take 1 tablet (75 mg total) by mouth daily.  . ferrous sulfate 325 (65 FE) MG tablet Take 1 tablet (325 mg total) by mouth daily with breakfast.  . isosorbide mononitrate (IMDUR) 30 MG 24 hr tablet TAKE 1/2 TABLET BY MOUTH DAILY  . levETIRAcetam (KEPPRA) 500 MG tablet take 1 tablet by mouth twice a day  . lisinopril (PRINIVIL,ZESTRIL) 2.5 MG tablet Take 1 tablet (2.5 mg total) by mouth daily.  . nitroGLYCERIN (NITROSTAT) 0.4 MG SL tablet Place 1 tablet (0.4 mg total) under the tongue every 5 (five) minutes as needed for chest pain (3 dose MAX).  [name] OVER THE COUNTER MEDICATION Take 1 drop by mouth daily. BLACKSEED OIL 1 DROP BY MOUTH ONCE DAILY  . PHENobarbital (LUMINAL) 32.4 MG tablet Take 1 tablet (32.4 mg total) by mouth 4 (four) times daily.   No facility-administered encounter medications on file as of 06/25/2017.     Activities of Daily Living No flowsheet data found.  Patient Care Team: [place], FNP as PCP - General (Family Medicine)   Assessment:    Physical assessment deferred to PCP.  Exercise Activities and Dietary recommendations   Diet (meal preparation, eat out, water intake, caffeinated beverages, dairy products, fruits and vegetables): in general, a "healthy" diet     Reviewed heart healthy and diabetic diet, encouraged patient to increase daily water intake. Diet education was attached to patient's AVS., Relevant patient education assigned to patient using Emmi.  Goals    None     Fall Risk Fall Risk  12/08/2013  Falls in the past year? No   Depression Screen PHQ 2/9 Scores 12/08/2013  PHQ - 2 Score 0    Cognitive Function       Ad8 score reviewed for issues:  Issues making decisions: no  Less interest in hobbies / activities: no  Repeats questions, stories (family complaining): no  Trouble using ordinary gadgets (microwave, computer, phone):no  Forgets the month or year: no  Mismanaging finances: no  Remembering  appts: no  Daily problems with thinking and/or memory: no Ad8 score is= 0  There is no immunization history on file for this patient. Screening Tests Health Maintenance  Topic Date Due  . Hepatitis C Screening  1961/08/03  . HIV Screening  03/18/1977  . TETANUS/TDAP  03/18/1981  . INFLUENZA VACCINE  05/21/2017  . COLONOSCOPY  09/08/2023      Plan:    Scheduled PCP visit 06/26/17 to evaluate swollen mouth and to refill medications.  Continue doing brain stimulating activities (puzzles, reading, adult coloring books, staying active) to keep memory sharp.  Start to eat heart healthy diet (full of fruits, vegetables, whole grains, lean protein, water--limit salt, fat, and sugar intake) and increase physical activity as tolerated.   I have personally reviewed and noted the following in the patient's chart:   . Medical and social history . Use of alcohol, tobacco or illicit drugs  . Current medications and supplements . Functional ability and status . Nutritional status . Physical activity . Advanced directives . List of other physicians . Vitals . Screenings to include cognitive, depression, and falls . Referrals and appointments  In addition, I have reviewed and discussed with patient certain preventive protocols, quality metrics, and best practice recommendations. A written personalized care plan for preventive services as well as general preventive health recommendations were provided to patient.    Medical screening examination/treatment/procedure(s) were performed by non-physician practitioner and as supervising provider I was immediately available for consultation/collaboration. I agree with treatment plan. Mauricio Po, [place], RN  06/24/2017

## 2017-06-25 ENCOUNTER — Ambulatory Visit (INDEPENDENT_AMBULATORY_CARE_PROVIDER_SITE_OTHER): Payer: Medicare Other | Admitting: *Deleted

## 2017-06-25 VITALS — BP 118/76 | HR 64 | Resp 20 | Ht 74.0 in | Wt 206.0 lb

## 2017-06-25 DIAGNOSIS — Z0000 Encounter for general adult medical examination without abnormal findings: Secondary | ICD-10-CM

## 2017-06-25 NOTE — Patient Instructions (Addendum)
Continue doing brain stimulating activities (puzzles, reading, adult coloring books, staying active) to keep memory sharp.   Start to eat heart healthy diet (full of fruits, vegetables, whole grains, lean protein, water--limit salt, fat, and sugar intake) and increase physical activity as tolerated.   [name] , Thank you for taking time to come for your Medicare Wellness Visit. I appreciate your ongoing commitment to your health goals. Please review the following plan we discussed and let me know if I can assist you in the future.   These are the goals we discussed: Goals    . Worship God and reach as many people who need help as I can       This is a list of the screening recommended for you and due dates:  Health Maintenance  Topic Date Due  .  Hepatitis C: One time screening is recommended by Center for Disease Control  (CDC) for  adults born from [birth year] through 1964.   1960/12/26  . HIV Screening  03/18/1977  . Tetanus Vaccine  03/18/1981  . Flu Shot  05/21/2017  . Colon Cancer Screening  09/08/2023    DASH Eating Plan DASH stands for "Dietary Approaches to Stop Hypertension." The DASH eating plan is a healthy eating plan that has been shown to reduce high blood pressure (hypertension). It may also reduce your risk for type 2 diabetes, heart disease, and stroke. The DASH eating plan may also help with weight loss. What are tips for following this plan? General guidelines  Avoid eating more than 2,300 mg (milligrams) of salt (sodium) a day. If you have hypertension, you may need to reduce your sodium intake to 1,500 mg a day.  Limit alcohol intake to no more than 1 drink a day for nonpregnant women and 2 drinks a day for men. One drink equals 12 oz of beer, 5 oz of Christop Hippert, or 1 oz of hard liquor.  Work with your health care provider to maintain a healthy body weight or to lose weight. Ask what an ideal weight is for you.  Get at least 30 minutes of exercise that causes your heart  to beat faster (aerobic exercise) most days of the week. Activities may include walking, swimming, or biking.  Work with your health care provider or diet and nutrition specialist (dietitian) to adjust your eating plan to your individual calorie needs. Reading food labels  Check food labels for the amount of sodium per serving. Choose foods with less than 5 percent of the Daily Value of sodium. Generally, foods with less than 300 mg of sodium per serving fit into this eating plan.  To find whole grains, look for the word "whole" as the first word in the ingredient list. Shopping  Buy products labeled as "low-sodium" or "no salt added."  Buy fresh foods. Avoid canned foods and premade or frozen meals. Cooking  Avoid adding salt when cooking. Use salt-free seasonings or herbs instead of table salt or sea salt. Check with your health care provider or pharmacist before using salt substitutes.  Do not fry foods. Cook foods using healthy methods such as baking, boiling, grilling, and broiling instead.  Cook with heart-healthy oils, such as olive, canola, soybean, or sunflower oil. Meal planning   Eat a balanced diet that includes: ? 5 or more servings of fruits and vegetables each day. At each meal, try to fill half of your plate with fruits and vegetables. ? Up to 6-8 servings of whole grains each day. ? Less  than 6 oz of lean meat, poultry, or fish each day. A 3-oz serving of meat is about the same size as a deck of cards. One egg equals 1 oz. ? 2 servings of low-fat dairy each day. ? A serving of nuts, seeds, or beans 5 times each week. ? Heart-healthy fats. Healthy fats called Omega-3 fatty acids are found in foods such as flaxseeds and coldwater fish, like sardines, salmon, and mackerel.  Limit how much you eat of the following: ? Canned or prepackaged foods. ? Food that is high in trans fat, such as fried foods. ? Food that is high in saturated fat, such as fatty meat. ? Sweets,  desserts, sugary drinks, and other foods with added sugar. ? Full-fat dairy products.  Do not salt foods before eating.  Try to eat at least 2 vegetarian meals each week.  Eat more home-cooked food and less restaurant, buffet, and fast food.  When eating at a restaurant, ask that your food be prepared with less salt or no salt, if possible. What foods are recommended? The items listed may not be a complete list. Talk with your dietitian about what dietary choices are best for you. Grains Whole-grain or whole-wheat bread. Whole-grain or whole-wheat pasta. Brown rice. [place]. Bulgur. Whole-grain and low-sodium cereals. Pita bread. Low-fat, low-sodium crackers. Whole-wheat flour tortillas. Vegetables Fresh or frozen vegetables (raw, steamed, roasted, or grilled). Low-sodium or reduced-sodium tomato and vegetable juice. Low-sodium or reduced-sodium tomato sauce and tomato paste. Low-sodium or reduced-sodium canned vegetables. Fruits All fresh, dried, or frozen fruit. Canned fruit in natural juice (without added sugar). Meat and other protein foods Skinless chicken or [place]. Ground chicken or [place]. Pork with fat trimmed off. Fish and seafood. Egg whites. Dried beans, peas, or lentils. Unsalted nuts, nut butters, and seeds. Unsalted canned beans. Lean cuts of beef with fat trimmed off. Low-sodium, lean deli meat. Dairy Low-fat (1%) or fat-free (skim) milk. Fat-free, low-fat, or reduced-fat cheeses. Nonfat, low-sodium ricotta or cottage cheese. Low-fat or nonfat yogurt. Low-fat, low-sodium cheese. Fats and oils Soft margarine without trans fats. Vegetable oil. Low-fat, reduced-fat, or light mayonnaise and salad dressings (reduced-sodium). Canola, safflower, olive, soybean, and sunflower oils. Avocado. Seasoning and other foods Herbs. Spices. Seasoning mixes without salt. Unsalted popcorn and pretzels. Fat-free sweets. What foods are not recommended? The items listed may not be a  complete list. Talk with your dietitian about what dietary choices are best for you. Grains Baked goods made with fat, such as croissants, muffins, or some breads. Dry pasta or rice meal packs. Vegetables Creamed or fried vegetables. Vegetables in a cheese sauce. Regular canned vegetables (not low-sodium or reduced-sodium). Regular canned tomato sauce and paste (not low-sodium or reduced-sodium). Regular tomato and vegetable juice (not low-sodium or reduced-sodium). Angie Fava. Olives. Fruits Canned fruit in a light or heavy syrup. Fried fruit. Fruit in cream or butter sauce. Meat and other protein foods Fatty cuts of meat. Ribs. Fried meat. Berniece Salines. Sausage. Bologna and other processed lunch meats. Salami. Fatback. Hotdogs. Bratwurst. Salted nuts and seeds. Canned beans with added salt. Canned or smoked fish. Whole eggs or egg yolks. Chicken or [place] with skin. Dairy Whole or 2% milk, cream, and half-and-half. Whole or full-fat cream cheese. Whole-fat or sweetened yogurt. Full-fat cheese. Nondairy creamers. Whipped toppings. Processed cheese and cheese spreads. Fats and oils Butter. Stick margarine. Lard. Shortening. Ghee. Bacon fat. Tropical oils, such as coconut, palm kernel, or palm oil. Seasoning and other foods Salted popcorn and pretzels. Onion salt, garlic  salt, seasoned salt, table salt, and sea salt. Worcestershire sauce. Tartar sauce. Barbecue sauce. Teriyaki sauce. Soy sauce, including reduced-sodium. Steak sauce. Canned and packaged gravies. Fish sauce. Oyster sauce. Cocktail sauce. Horseradish that you find on the shelf. Ketchup. Mustard. Meat flavorings and tenderizers. Bouillon cubes. Hot sauce and Tabasco sauce. Premade or packaged marinades. Premade or packaged taco seasonings. Relishes. Regular salad dressings. Where to find more information:  National Heart, Lung, and [place]: https://wilson-eaton.com/  American Heart Association: www.heart.org Summary  The DASH eating plan is a  healthy eating plan that has been shown to reduce high blood pressure (hypertension). It may also reduce your risk for type 2 diabetes, heart disease, and stroke.  With the DASH eating plan, you should limit salt (sodium) intake to 2,300 mg a day. If you have hypertension, you may need to reduce your sodium intake to 1,500 mg a day.  When on the DASH eating plan, aim to eat more fresh fruits and vegetables, whole grains, lean proteins, low-fat dairy, and heart-healthy fats.  Work with your health care provider or diet and nutrition specialist (dietitian) to adjust your eating plan to your individual calorie needs. This information is not intended to replace advice given to you by your health care provider. Make sure you discuss any questions you have with your health care provider. Document Released: 09/26/2011 Document Revised: 09/30/2016 Document Reviewed: 09/30/2016 Elsevier Interactive Patient Education  2017 [employer] Heart-healthy meal planning includes:  Limiting unhealthy fats.  Increasing healthy fats.  Making other small dietary changes.  You may need to talk with your doctor or a diet specialist (dietitian) to create an eating plan that is right for you. What types of fat should I choose?  Choose healthy fats. These include olive oil and canola oil, flaxseeds, walnuts, almonds, and seeds.  Eat more omega-3 fats. These include salmon, mackerel, sardines, tuna, flaxseed oil, and ground flaxseeds. Try to eat fish at least twice each week.  Limit saturated fats. ? Saturated fats are often found in animal products, such as meats, butter, and cream. ? Plant sources of saturated fats include palm oil, palm kernel oil, and coconut oil.  Avoid foods with partially hydrogenated oils in them. These include stick margarine, some tub margarines, cookies, crackers, and other baked goods. These contain trans fats. What general guidelines do I need to  follow?  Check food labels carefully. Identify foods with trans fats or high amounts of saturated fat.  Fill one half of your plate with vegetables and green salads. Eat 4-5 servings of vegetables per day. A serving of vegetables is: ? 1 cup of raw leafy vegetables. ?  cup of raw or cooked cut-up vegetables. ?  cup of vegetable juice.  Fill one fourth of your plate with whole grains. Look for the word "whole" as the first word in the ingredient list.  Fill one fourth of your plate with lean protein foods.  Eat 4-5 servings of fruit per day. A serving of fruit is: ? One medium whole fruit. ?  cup of dried fruit. ?  cup of fresh, frozen, or canned fruit. ?  cup of 100% fruit juice.  Eat more foods that contain soluble fiber. These include apples, broccoli, carrots, beans, peas, and barley. Try to get 20-30 g of fiber per day.  Eat more home-cooked food. Eat less restaurant, buffet, and fast food.  Limit or avoid alcohol.  Limit foods high in starch and sugar.  Avoid fried foods.  Avoid frying your food. Try baking, boiling, grilling, or broiling it instead. You can also reduce fat by: ? Removing the skin from poultry. ? Removing all visible fats from meats. ? Skimming the fat off of stews, soups, and gravies before serving them. ? Steaming vegetables in water or broth.  Lose weight if you are overweight.  Eat 4-5 servings of nuts, legumes, and seeds per week: ? One serving of dried beans or legumes equals  cup after being cooked. ? One serving of nuts equals 1 ounces. ? One serving of seeds equals  ounce or one tablespoon.  You may need to keep track of how much salt or sodium you eat. This is especially true if you have high blood pressure. Talk with your doctor or dietitian to get more information. What foods can I eat? Grains Breads, including [place], white, pita, wheat, raisin, rye, oatmeal, and [place]. Tortillas that are neither fried nor made with lard or  trans fat. Low-fat rolls, including hotdog and hamburger buns and English muffins. Biscuits. Muffins. Waffles. Pancakes. Light popcorn. Whole-grain cereals. Flatbread. Melba toast. Pretzels. Breadsticks. Rusks. Low-fat snacks. Low-fat crackers, including oyster, saltine, matzo, graham, animal, and rye. Rice and pasta, including brown rice and pastas that are made with whole wheat. Vegetables All vegetables. Fruits All fruits, but limit coconut. Meats and Other Protein Sources Lean, well-trimmed beef, veal, pork, and lamb. Chicken and [place] without skin. All fish and shellfish. Wild duck, rabbit, pheasant, and venison. Egg whites or low-cholesterol egg substitutes. Dried beans, peas, lentils, and tofu. Seeds and most nuts. Dairy Low-fat or nonfat cheeses, including ricotta, string, and mozzarella. Skim or 1% milk that is liquid, powdered, or evaporated. Buttermilk that is made with low-fat milk. Nonfat or low-fat yogurt. Beverages Mineral water. Diet carbonated beverages. Sweets and Desserts Sherbets and fruit ices. Honey, jam, marmalade, jelly, and syrups. Meringues and gelatins. Pure sugar candy, such as hard candy, jelly beans, gumdrops, mints, marshmallows, and small amounts of dark chocolate. [employer] Eat all sweets and desserts in moderation. Fats and Oils Nonhydrogenated (trans-free) margarines. Vegetable oils, including soybean, sesame, sunflower, olive, peanut, safflower, corn, canola, and cottonseed. Salad dressings or mayonnaise made with a vegetable oil. Limit added fats and oils that you use for cooking, baking, salads, and as spreads. Other Cocoa powder. Coffee and tea. All seasonings and condiments. The items listed above may not be a complete list of recommended foods or beverages. Contact your dietitian for more options. What foods are not recommended? Grains Breads that are made with saturated or trans fats, oils, or whole milk. Croissants. Butter rolls. Cheese breads.  Sweet rolls. Donuts. Buttered popcorn. Chow mein noodles. High-fat crackers, such as cheese or butter crackers. Meats and Other Protein Sources Fatty meats, such as hotdogs, short ribs, sausage, spareribs, bacon, rib eye roast or steak, and mutton. High-fat deli meats, such as salami and bologna. Caviar. Domestic duck and goose. Organ meats, such as kidney, liver, sweetbreads, and heart. Dairy Cream, sour cream, cream cheese, and creamed cottage cheese. Whole-milk cheeses, including blue (bleu), Monte[place] Jack, [place], [place], American, [place], Swiss, cheddar, Golden, and [place]. Whole or 2% milk that is liquid, evaporated, or condensed. Whole buttermilk. Cream sauce or high-fat cheese sauce. Yogurt that is made from whole milk. Beverages Regular sodas and juice drinks with added sugar. Sweets and Desserts Frosting. Pudding. Cookies. Cakes other than angel food cake. Candy that has milk chocolate or white chocolate, hydrogenated fat, butter, coconut, or unknown ingredients. Buttered syrups. Full-fat ice  cream or ice cream drinks. Fats and Oils Gravy that has suet, meat fat, or shortening. Cocoa butter, hydrogenated oils, palm oil, coconut oil, palm kernel oil. These can often be found in baked products, candy, fried foods, nondairy creamers, and whipped toppings. Solid fats and shortenings, including bacon fat, salt pork, lard, and butter. Nondairy cream substitutes, such as coffee creamers and sour cream substitutes. Salad dressings that are made of unknown oils, cheese, or sour cream. The items listed above may not be a complete list of foods and beverages to avoid. Contact your dietitian for more information. This information is not intended to replace advice given to you by your health care provider. Make sure you discuss any questions you have with your health care provider. Document Released: 04/07/2012 Document Revised: 03/14/2016 Document Reviewed: 03/31/2014 Elsevier Interactive Patient  Education  [employer].

## 2017-06-26 ENCOUNTER — Ambulatory Visit (INDEPENDENT_AMBULATORY_CARE_PROVIDER_SITE_OTHER): Payer: Medicare Other | Admitting: Family

## 2017-06-26 ENCOUNTER — Encounter: Payer: Self-pay | Admitting: Family

## 2017-06-26 VITALS — BP 134/74 | HR 85 | Temp 98.5°F | Resp 16 | Ht 74.0 in | Wt 205.0 lb

## 2017-06-26 DIAGNOSIS — K047 Periapical abscess without sinus: Secondary | ICD-10-CM

## 2017-06-26 DIAGNOSIS — I25119 Atherosclerotic heart disease of native coronary artery with unspecified angina pectoris: Secondary | ICD-10-CM | POA: Diagnosis not present

## 2017-06-26 MED ORDER — AMOXICILLIN-POT CLAVULANATE 875-125 MG PO TABS
1.0000 | ORAL_TABLET | Freq: Two times a day (BID) | ORAL | 0 refills | Status: DC
Start: 2017-06-26 — End: 2018-01-20

## 2017-06-26 NOTE — Patient Instructions (Signed)
Thank you for choosing [facility].  SUMMARY AND INSTRUCTIONS:  Continue to take your medications as prescribed.  Start and complete the Augmentin.   Continue with hydrogen peroxide rinses.  Follow up with dentistry.  Medication:  Your prescription(s) have been submitted to your pharmacy or been printed and provided for you. Please take as directed and contact our office if you believe you are having problem(s) with the medication(s) or have any questions.  Follow up:  If your symptoms worsen or fail to improve, please contact our office for further instruction, or in case of emergency go directly to the emergency room at the closest medical facility.

## 2017-06-26 NOTE — Progress Notes (Signed)
Subjective:    Patient ID: [name], male    DOB: 1961-05-17, 56 y.o.   MRN: [id number]  Chief Complaint  Patient presents with  . Oral Pain    having pain in mouth in gum area     HPI:  [name] is a 56 y.o. male who  has a past medical history of Anemia (01/01/2017); CAD (coronary artery disease); Chronic combined systolic and diastolic CHF (congestive heart failure) ([place]) (06/06/2015); Clotting disorder ([place]); Diabetes mellitus without complication ([place]); HLD (hyperlipidemia); Hyperglycemia (A1C 6.0 05/2015.); Hypertension; MI (myocardial infarction) ([place]) (x 7); Poor compliance; Seizure disorder ([place]); Seizures ([place]); TOBACCO ABUSE (02/22/2009); and Valvular heart disease. and presents today for an acute office visit  This is a new problem. Associated symptom of pain located in his mouth has been going on for about 1 weeks. No fevers. Described as tender. Modifying factors include hydrogen peroxide and salt water which have helped a little. Has dentures but does not wear them secondary to fit. He has concern for infection.    No Known Allergies    Outpatient Medications Prior to Visit  Medication Sig Dispense Refill  . AMBULATORY NON FORMULARY MEDICATION Medication Name: Black seed oil    . aspirin EC 81 MG tablet Take 1 tablet (81 mg total) by mouth daily.    [name] atorvastatin (LIPITOR) 20 MG tablet Take 1 tablet (20 mg total) by mouth daily. 30 tablet 11  . clopidogrel (PLAVIX) 75 MG tablet Take 1 tablet (75 mg total) by mouth daily. 90 tablet 2  . ferrous sulfate 325 (65 FE) MG tablet Take 1 tablet (325 mg total) by mouth daily with breakfast. 90 tablet 3  . isosorbide mononitrate (IMDUR) 30 MG 24 hr tablet TAKE 1/2 TABLET BY MOUTH DAILY 15 tablet 6  . levETIRAcetam (KEPPRA) 500 MG tablet take 1 tablet by mouth twice a day 60 tablet 5  . nitroGLYCERIN (NITROSTAT) 0.4 MG SL tablet Place 1 tablet (0.4 mg total) under the tongue every 5 (five) minutes as needed for chest pain  (3 dose MAX). 25 tablet 3  . OVER THE COUNTER MEDICATION Take 1 drop by mouth daily. BLACKSEED OIL 1 DROP BY MOUTH ONCE DAILY    . PHENobarbital (LUMINAL) 32.4 MG tablet Take 1 tablet (32.4 mg total) by mouth 4 (four) times daily. 120 tablet 5  . carvedilol (COREG) 3.125 MG tablet Take 1 tablet (3.125 mg total) by mouth 2 (two) times daily. 180 tablet 3  . lisinopril (PRINIVIL,ZESTRIL) 2.5 MG tablet Take 1 tablet (2.5 mg total) by mouth daily. 90 tablet 3   No facility-administered medications prior to visit.       Past Surgical History:  Procedure Laterality Date  . CARDIAC CATHETERIZATION [name] 05/29/2015   Procedure: Left Heart Cath and Coronary Angiography;  Surgeon: Belva Crome, MD;  Location: [facility];  Service: Cardiovascular;  Laterality: [name];  . COLONOSCOPY    . CORONARY ANGIOPLASTY  03/2011   PTCA-prox LCx ISR  . CORONARY ANGIOPLASTY WITH STENT PLACEMENT  10/2007   BMS-prox LCx  . LEFT HEART CATHETERIZATION WITH CORONARY ANGIOGRAM [name] 10/13/2013   Procedure: LEFT HEART CATHETERIZATION WITH CORONARY ANGIOGRAM;  Surgeon: Blane Ohara, MD;  Location: [facility];  Service: Cardiovascular;  Laterality: [name];  . TEE WITHOUT CARDIOVERSION [name] 06/16/2015   Procedure: TRANSESOPHAGEAL ECHOCARDIOGRAM (TEE);  Surgeon: Josue Hector, MD;  Location: [facility];  Service: Cardiovascular;  Laterality: [name];      Past  Medical History:  Diagnosis Date  . Anemia 01/01/2017  . CAD (coronary artery disease)    a. Overlapping BMS-prox LCx in 2009 b. NSTEMI 2012 due to prox LCx ISR s/p PTCA alone; EF 45% w/ basal inf HK. Had run out of Plavix 1 week prior. c. NSTEMI 05/2015 after running out of Plavix - cath with 3V CAD, Patient left AMA prior to agreeing to CABG. // d. LHC 8/16: LM 60; LAD mid 60, D1 75; RI 50, 80; LCx prox stent 100; RCA prox 100; EF 45-50 >> seen by TCTS - declined CABG >> Med Rx.  . Chronic combined systolic and diastolic CHF (congestive heart failure) ([place])  06/06/2015   a - Echo 8/16:  EF 40-45, inf-lat AK, Gr 2 diastolic dysfunction, mild AI, mod MR // b - TEE 8/16: EF 45-50, inf HK, mod MR, mild TR // c - Echo 4/18: EF 40-45, ant-lat and inf-lat HK, Gr 2 DD, mild AI, mild to mod MR, severe LAE, PASP 29   . Clotting disorder ([place])   . Diabetes mellitus without complication ([place])   . HLD (hyperlipidemia)   . Hyperglycemia A1C 6.0 05/2015.  [name] Hypertension   . MI (myocardial infarction) ([place]) x 7  . Poor compliance   . Seizure disorder ([place])   . Seizures ([place])   . TOBACCO ABUSE 02/22/2009  . Valvular heart disease    a. Echo 05/2015: mild AI, mod MR.      Review of Systems  Constitutional: Negative for chills and fever.  HENT: Positive for dental problem. Negative for sore throat.   Respiratory: Negative for chest tightness and shortness of breath.       Objective:    BP 134/74 (BP Location: Left Arm, Patient Position: Sitting, Cuff Size: Large)   Pulse 85   Temp 98.5 F (36.9 C) (Oral)   Resp 16   Ht 6\' 2"  (1.88 m)   Wt 205 lb (93 kg)   SpO2 96%   BMI 26.32 kg/m  Nursing note and vital signs reviewed.  Physical Exam  Constitutional: He is oriented to person, place, and time. He appears well-developed and well-nourished. No distress.  HENT:  Mouth/Throat: Abnormal dentition.  Right side of lower jaw with mild inflammation. There is a partial tooth noted in the right upper anterior mouth. No bleeding or obvious abscess.   Cardiovascular: Normal rate, regular rhythm, normal heart sounds and intact distal pulses.   Pulmonary/Chest: Effort normal and breath sounds normal.  Neurological: He is alert and oriented to person, place, and time.  Skin: Skin is warm and dry.  Psychiatric: He has a normal mood and affect. His behavior is normal. Judgment and thought content normal.       Assessment & Plan:   Problem List Items Addressed This Visit      Digestive   Dental infection - Primary    New onset dental infection in the  setting of poor dental hygiene. Start Augmentin. Continue with OTC medications and peroxide rinse. Follow up with dentistry for further evaluation. Continue to monitor.           I am having Mr. Kopko start on amoxicillin-clavulanate. I am also having him maintain his nitroGLYCERIN, isosorbide mononitrate, clopidogrel, OVER THE COUNTER MEDICATION, aspirin EC, carvedilol, atorvastatin, ferrous sulfate, lisinopril, AMBULATORY NON FORMULARY MEDICATION, levETIRAcetam, and PHENobarbital.   Meds ordered this encounter  Medications  . amoxicillin-clavulanate (AUGMENTIN) 875-125 MG tablet    Sig: Take 1 tablet by mouth 2 (two) times  daily.    Dispense:  14 tablet    Refill:  0    Order Specific Question:   Supervising Provider    Answer:   Pricilla Holm A [[id number]]     Follow-up: Return if symptoms worsen or fail to improve.  Mauricio Po, FNP

## 2017-06-26 NOTE — Assessment & Plan Note (Signed)
New onset dental infection in the setting of poor dental hygiene. Start Augmentin. Continue with OTC medications and peroxide rinse. Follow up with dentistry for further evaluation. Continue to monitor.

## 2017-07-29 ENCOUNTER — Telehealth: Payer: Self-pay

## 2017-07-29 ENCOUNTER — Other Ambulatory Visit: Payer: Self-pay | Admitting: Cardiovascular Disease

## 2017-07-29 DIAGNOSIS — I340 Nonrheumatic mitral (valve) insufficiency: Secondary | ICD-10-CM

## 2017-07-29 DIAGNOSIS — I214 Non-ST elevation (NSTEMI) myocardial infarction: Secondary | ICD-10-CM

## 2017-07-29 DIAGNOSIS — I10 Essential (primary) hypertension: Secondary | ICD-10-CM

## 2017-07-29 DIAGNOSIS — Z9119 Patient's noncompliance with other medical treatment and regimen: Secondary | ICD-10-CM

## 2017-07-29 DIAGNOSIS — I25118 Atherosclerotic heart disease of native coronary artery with other forms of angina pectoris: Secondary | ICD-10-CM

## 2017-07-29 DIAGNOSIS — Z91199 Patient's noncompliance with other medical treatment and regimen due to unspecified reason: Secondary | ICD-10-CM

## 2017-07-29 NOTE — Telephone Encounter (Signed)
Antiplatelet clearance to be addressed by NP or PA.

## 2017-07-29 NOTE — Telephone Encounter (Signed)
    [facility] Pre-operative Risk Assessment    Request for surgical clearance:  1. What type of surgery is being performed? Dental procedure  2. When is this surgery scheduled? TBD pending surgical clearance   Are there any medications that need to be held prior to surgery and how long? Patient reports being on blood thinner. Please advise.  3. Practice name and name of physician performing [facility]? [facility] Doctor name not listed.  4. What is your office phone and fax number? P: [phone]. F: [phone]  5. Anesthesia type (None, local, MAC, general) ? NOT SPECIFIED.   Stephannie Peters 07/29/2017, 11:56 AM  _________________________________________________________________   (provider comments below)

## 2017-07-31 NOTE — Telephone Encounter (Signed)
    By review of his chart, last stent placement was in 2012 but he had 3-vessel CAD by cath 2016 and declined CABG at that time. With his significant CAD, would continue on ASA and Plavix if possible. If requiring multiple extractions with significant bleeding risk, could hold Plavix for 5 days but would continue ASA.   Signed, Erma Heritage, PA-C 07/31/2017, 4:27 PM

## 2017-08-02 ENCOUNTER — Other Ambulatory Visit: Payer: Self-pay | Admitting: Cardiology

## 2017-08-06 ENCOUNTER — Encounter: Payer: Self-pay | Admitting: Physician Assistant

## 2017-08-11 ENCOUNTER — Telehealth: Payer: Self-pay | Admitting: Cardiovascular Disease

## 2017-08-11 NOTE — Telephone Encounter (Signed)
Informed patient fax was received and clearance was faxed back on October 11. Per patient request, faxed again and copy placed at front desk for patient pick up. He was grateful for call.

## 2017-08-11 NOTE — Telephone Encounter (Signed)
New message    Pt is calling to find out if fax was received from [employer].

## 2017-08-20 ENCOUNTER — Ambulatory Visit (INDEPENDENT_AMBULATORY_CARE_PROVIDER_SITE_OTHER): Payer: Medicare Other | Admitting: Physician Assistant

## 2017-08-20 ENCOUNTER — Encounter: Payer: Self-pay | Admitting: Physician Assistant

## 2017-08-20 VITALS — BP 108/64 | HR 72 | Ht 74.0 in | Wt 207.4 lb

## 2017-08-20 DIAGNOSIS — I5042 Chronic combined systolic (congestive) and diastolic (congestive) heart failure: Secondary | ICD-10-CM | POA: Diagnosis not present

## 2017-08-20 DIAGNOSIS — I25119 Atherosclerotic heart disease of native coronary artery with unspecified angina pectoris: Secondary | ICD-10-CM

## 2017-08-20 DIAGNOSIS — I209 Angina pectoris, unspecified: Secondary | ICD-10-CM

## 2017-08-20 DIAGNOSIS — I340 Nonrheumatic mitral (valve) insufficiency: Secondary | ICD-10-CM

## 2017-08-20 DIAGNOSIS — I10 Essential (primary) hypertension: Secondary | ICD-10-CM

## 2017-08-20 DIAGNOSIS — F17200 Nicotine dependence, unspecified, uncomplicated: Secondary | ICD-10-CM

## 2017-08-20 DIAGNOSIS — E7800 Pure hypercholesterolemia, unspecified: Secondary | ICD-10-CM | POA: Diagnosis not present

## 2017-08-20 NOTE — Patient Instructions (Signed)
Medication Instructions:  Your physician recommends that you continue on your current medications as directed. Please refer to the Current Medication list given to you today.   Labwork: IN 6 MONTHS: FASTING LIPID PANEL, CMET TO BE DONE 1 WEEK BEFORE APPOINTMENT WITH [facility]  Testing/Procedures: Your physician has requested that you have an echocardiogram. Echocardiography is a painless test that uses sound waves to create images of your heart. It provides your doctor with information about the size and shape of your heart and how well your heart's chambers and valves are working. This procedure takes approximately one hour. There are no restrictions for this procedure. TO BE DONE 1 WEEK BEFORE APPOINTMENT WITH [facility]    Follow-Up: Your physician wants you to follow-up in [facility] You will receive a reminder letter in the mail two months in advance. If you don't receive a letter, please call our office to schedule the follow-up appointment.   Any Other Special Instructions Will Be Listed Below (If Applicable).     If you need a refill on your cardiac medications before your next appointment, please call your pharmacy.

## 2017-08-20 NOTE — Progress Notes (Signed)
Cardiology Office Note:    Date:  08/20/2017   ID:  [name], DOB 1961-03-05, MRN [id number]  PCP:  Golden Circle, FNP  Cardiologist:  Dr. Sherren Mocha    Referring MD: Golden Circle, FNP   Chief Complaint  Patient presents with  . Follow-up    Heart failure, CAD    History of Present Illness:    [name] is a 56 y.o. male with a hx of CAD status post BMS x 2 to the prox CFX in 2009, non-STEMI 2012 due to ISR of CFX stents (off Plavix) treated with angioplasty, ischemic cardiomyopathy, HTN, HL, seizure disorder, reported "clotting disorder," tobacco abuse, noncompliance. Cardiac cathin 09/2013 demonstrated patent stents and nonobstructive disease elsewhere. He was then admitted in 2016 with NSTEMI and LHC demonstrated 3V CAD with total occlusion of the RCA and LCx and mod stenosis of the LM and LAD. He also has mod MR. He was seen by [facility] and he declined CABG. He was noted to have a low hemoglobin in February 2018.  He was referred to gastroenterology but canceled/missed several appointments.  He was ultimately seen in April 2018 and placed on iron therapy.  [name] returns for follow-up.  He is here alone.  The patient denies any chest pain, significant dyspnea, syncope, orthopnea, PND, edema.  Prior CV studies:   The following studies were reviewed today:  Echocardiogram 01/31/17 EF 40-45, anterolateral/inferolateral hypokinesis, grade 2 diastolic dysfunction, mild AI, mild to moderate MR, severe LAE, normal RV SF, PASP 29  TEE 8/16 EF 45-50, inf HK, mod MR, mild TR  LHC 8/16 LM 60 LAD mid 60, D1 75 RI 50, 80 LCx prox stent 100 RCA prox 100 EF 45-50  Echo 8/16 EF 40-45, inf-lat AK, Gr 2 diastolic dysfunction, mild AI, mod MR  Past Medical History:  Diagnosis Date  . Anemia 01/01/2017  . CAD (coronary artery disease)    a. Overlapping BMS-prox LCx in 2009 b. NSTEMI 2012 due to prox LCx ISR s/p PTCA alone; EF 45% w/ basal inf HK. Had run out  of Plavix 1 week prior. c. NSTEMI 05/2015 after running out of Plavix - cath with 3V CAD, Patient left AMA prior to agreeing to CABG. // d. LHC 8/16: LM 60; LAD mid 60, D1 75; RI 50, 80; LCx prox stent 100; RCA prox 100; EF 45-50 >> seen by TCTS - declined CABG >> Med Rx.  . Chronic combined systolic and diastolic CHF (congestive heart failure) ([place]) 06/06/2015   a - Echo 8/16:  EF 40-45, inf-lat AK, Gr 2 diastolic dysfunction, mild AI, mod MR // b - TEE 8/16: EF 45-50, inf HK, mod MR, mild TR // c - Echo 4/18: EF 40-45, ant-lat and inf-lat HK, Gr 2 DD, mild AI, mild to mod MR, severe LAE, PASP 29   . Clotting disorder ([place])   . Diabetes mellitus without complication ([place])   . HLD (hyperlipidemia)   . Hyperglycemia A1C 6.0 05/2015.  [name] Hypertension   . MI (myocardial infarction) ([place]) x 7  . Poor compliance   . Seizure disorder ([place])   . Seizures ([place])   . TOBACCO ABUSE 02/22/2009  . Valvular heart disease    a. Echo 05/2015: mild AI, mod MR.    Past Surgical History:  Procedure Laterality Date  . CARDIAC CATHETERIZATION [name] 05/29/2015   Procedure: Left Heart Cath and Coronary Angiography;  Surgeon: Belva Crome, MD;  Location: [facility];  Service: Cardiovascular;  Laterality: [name];  . COLONOSCOPY    . CORONARY ANGIOPLASTY  03/2011   PTCA-prox LCx ISR  . CORONARY ANGIOPLASTY WITH STENT PLACEMENT  10/2007   BMS-prox LCx  . LEFT HEART CATHETERIZATION WITH CORONARY ANGIOGRAM [name] 10/13/2013   Procedure: LEFT HEART CATHETERIZATION WITH CORONARY ANGIOGRAM;  Surgeon: Blane Ohara, MD;  Location: [facility] CATH LAB;  Service: Cardiovascular;  Laterality: [name];  . TEE WITHOUT CARDIOVERSION [name] 06/16/2015   Procedure: TRANSESOPHAGEAL ECHOCARDIOGRAM (TEE);  Surgeon: Josue Hector, MD;  Location: [facility] ENDOSCOPY;  Service: Cardiovascular;  Laterality: [name];    Current Medications: Current Meds  Medication Sig  . AMBULATORY NON FORMULARY MEDICATION Medication Name: Black seed oil  .  amoxicillin-clavulanate (AUGMENTIN) 875-125 MG tablet Take 1 tablet by mouth 2 (two) times daily.  [name] aspirin EC 81 MG tablet Take 1 tablet (81 mg total) by mouth daily.  [name] atorvastatin (LIPITOR) 20 MG tablet Take 1 tablet (20 mg total) by mouth daily.  . carvedilol (COREG) 3.125 MG tablet Take 1 tablet (3.125 mg total) by mouth 2 (two) times daily.  . clopidogrel (PLAVIX) 75 MG tablet take 1 tablet by mouth once daily  . ferrous sulfate 325 (65 FE) MG tablet Take 1 tablet (325 mg total) by mouth daily with breakfast.  . furosemide (LASIX) 40 MG tablet Take 40 mg by mouth daily as needed for edema. Take for weight gain (3 lbs or more in 1 day) or leg swelling  . isosorbide mononitrate (IMDUR) 30 MG 24 hr tablet TAKE 1/2 TABLET BY MOUTH DAILY  . levETIRAcetam (KEPPRA) 500 MG tablet take 1 tablet by mouth twice a day  . lisinopril (PRINIVIL,ZESTRIL) 2.5 MG tablet Take 1 tablet (2.5 mg total) by mouth daily.  . nitroGLYCERIN (NITROSTAT) 0.4 MG SL tablet place 1 tablet under the tongue if needed every 5 minutes for chest pain for 3 doses  . OVER THE COUNTER MEDICATION Take 1 drop by mouth daily. BLACKSEED OIL 1 DROP BY MOUTH ONCE DAILY  . PHENobarbital (LUMINAL) 32.4 MG tablet Take 1 tablet (32.4 mg total) by mouth 4 (four) times daily.     Allergies:   Patient has no known allergies.   Social History  Substance Use Topics  . Smoking status: Current Some Day Smoker    Years: 35.00    Types: Cigars  . Smokeless tobacco: Never Used     Comment: smoke cigars 2 per day  . Alcohol use No     Family Hx: The patient's family history includes Coronary artery disease in his maternal aunt; Heart attack in his maternal uncle; Heart disease in his maternal uncle; Hypertension in his unknown relative; Stroke in his maternal uncle. There is no history of Colon cancer, Stomach cancer, Rectal cancer, or Pancreatic cancer.  ROS:   Please see the history of present illness.    ROS All other systems reviewed  and are negative.   EKGs/Labs/Other Test Reviewed:    EKG:  EKG is  ordered today.  The ekg ordered today demonstrates normal sinus rhythm, heart rate 72, normal axis, QTC 409 ms  Recent Labs: 11/27/2016: ALT 15; NT-Pro BNP 74; TSH 1.960 12/04/2016: BUN 10; Creatinine, Ser 1.02; Potassium 4.8; Sodium 140 02/17/2017: Hemoglobin 11.6; Platelets 435.0   Recent Lipid Panel Lab Results  Component Value Date/Time   CHOL 216 (H) 12/04/2016 12:19 PM   TRIG 66 12/04/2016 12:19 PM   HDL 38 (L) 12/04/2016 12:19 PM   CHOLHDL 5.7 (H) 12/04/2016 12:19 PM  CHOLHDL 4.5 04/07/2011 01:51 AM   LDLCALC 165 (H) 12/04/2016 12:19 PM    Physical Exam:    VS:  BP 108/64   Pulse 72   Ht _0  (1.88 m)   Wt 207 lb 6.4 oz (94.1 kg)   SpO2 96%   BMI 26.63 kg/m     Wt Readings from Last 3 Encounters:  08/20/17 207 lb 6.4 oz (94.1 kg)  06/26/17 205 lb (93 kg)  06/25/17 206 lb (93.4 kg)     Physical Exam  Constitutional: He is oriented to person, place, and time. He appears well-developed and well-nourished. No distress.  HENT:  Head: Normocephalic and atraumatic.  Neck: No JVD present. Carotid bruit is not present.  Cardiovascular: Normal rate and regular rhythm.   No murmur heard. Pulmonary/Chest: Effort normal. He has no rales.  Abdominal: Soft.  Musculoskeletal: He exhibits no edema.  Neurological: He is alert and oriented to person, place, and time.  Skin: Skin is warm and dry.    ASSESSMENT:    1. Chronic combined systolic and diastolic CHF (congestive heart failure) ([place])   2. Coronary artery disease involving native coronary artery of native heart with angina pectoris ([place])   3. Essential hypertension   4. Pure hypercholesterolemia   5. Non-rheumatic mitral regurgitation   6. TOBACCO ABUSE    PLAN:    In order of problems listed above:  1.  Chronic combined systolic and diastolic CHF (congestive heart failure) ([place]) EF by echo in April 2018 was 40-45 with moderate diastolic  dysfunction.  He is New York Heart Association class II.  Volume is currently stable.  He takes Lasix as needed.  Continue current dose of carvedilol, lisinopril, isosorbide.  Arrange follow-up CMET and echocardiogram prior to next visit.  2.  Coronary artery disease involving native coronary artery of native heart with angina pectoris ([place]) History of three-vessel coronary artery disease that is treated medically.  He currently denies anginal symptoms.  Continue aspirin, Plavix, beta-blocker, nitrates, statin.  3.  Essential hypertension The patient's blood pressure is controlled on his current regimen.  Continue current therapy.   4.  Pure hypercholesterolemia Arrange fasting lipids and CMET prior to next visit.  Continue statin.  5.  Non-rheumatic mitral regurgitation Mild to moderate mitral regurgitation by echocardiogram April 2018.  Plan follow-up echo in April 2019.  6.  TOBACCO ABUSE I have again recommended cessation.   Dispo:  Return in about 6 months (around 02/17/2018) for Routine Follow Up, w/ Richardson Dopp, PA-C.   Medication Adjustments/Labs and Tests Ordered: Current medicines are reviewed at length with the patient today.  Concerns regarding medicines are outlined above.  Tests Ordered: Orders Placed This Encounter  Procedures  . Comp Met (CMET)  . Lipid panel  . EKG 12-Lead  . ECHOCARDIOGRAM COMPLETE   Medication Changes: No orders of the defined types were placed in this encounter.   Signed, Richardson Dopp, PA-C  08/20/2017 4:38 PM    [facility] [address], [place], [place]  [address] Phone: [phone]; Fax: [phone]

## 2017-09-23 ENCOUNTER — Telehealth: Payer: Self-pay

## 2017-09-23 ENCOUNTER — Other Ambulatory Visit: Payer: Self-pay | Admitting: Physician Assistant

## 2017-09-23 ENCOUNTER — Other Ambulatory Visit: Payer: Self-pay

## 2017-09-23 DIAGNOSIS — Z9119 Patient's noncompliance with other medical treatment and regimen: Secondary | ICD-10-CM

## 2017-09-23 DIAGNOSIS — I10 Essential (primary) hypertension: Secondary | ICD-10-CM

## 2017-09-23 DIAGNOSIS — I340 Nonrheumatic mitral (valve) insufficiency: Secondary | ICD-10-CM

## 2017-09-23 DIAGNOSIS — I214 Non-ST elevation (NSTEMI) myocardial infarction: Secondary | ICD-10-CM

## 2017-09-23 DIAGNOSIS — Z91199 Patient's noncompliance with other medical treatment and regimen due to unspecified reason: Secondary | ICD-10-CM

## 2017-09-23 DIAGNOSIS — I25118 Atherosclerotic heart disease of native coronary artery with other forms of angina pectoris: Secondary | ICD-10-CM

## 2017-09-23 MED ORDER — ISOSORBIDE MONONITRATE ER 30 MG PO TB24: 15.0000 mg | ORAL_TABLET | Freq: Every day | ORAL | 10 refills | Status: DC

## 2017-09-23 MED ORDER — PHENOBARBITAL 32.4 MG PO TABS: 32.4000 mg | ORAL_TABLET | Freq: Four times a day (QID) | ORAL | 5 refills | Status: DC

## 2017-09-23 NOTE — Telephone Encounter (Signed)
Patient is calling for a refill on his phenobarbital. Confirmed pharmacy.

## 2017-09-23 NOTE — Telephone Encounter (Signed)
Phenobarbital fax to [facility] at [phone] [phone]. Fax twice and receive.

## 2017-12-15 ENCOUNTER — Other Ambulatory Visit: Payer: Self-pay | Admitting: Physician Assistant

## 2017-12-15 MED ORDER — ATORVASTATIN CALCIUM 20 MG PO TABS: 20.0000 mg | ORAL_TABLET | Freq: Every day | ORAL | 2 refills | Status: DC
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
## 2018-01-19 ENCOUNTER — Encounter: Payer: Self-pay | Admitting: Nurse Practitioner

## 2018-01-19 ENCOUNTER — Ambulatory Visit (INDEPENDENT_AMBULATORY_CARE_PROVIDER_SITE_OTHER): Payer: Medicare Other | Admitting: Nurse Practitioner

## 2018-01-19 ENCOUNTER — Ambulatory Visit: Payer: Medicare Other | Admitting: Nurse Practitioner

## 2018-01-19 VITALS — BP 118/69 | HR 68 | Ht 74.0 in | Wt 213.6 lb

## 2018-01-19 DIAGNOSIS — G40909 Epilepsy, unspecified, not intractable, without status epilepticus: Secondary | ICD-10-CM | POA: Diagnosis not present

## 2018-01-19 DIAGNOSIS — Z5181 Encounter for therapeutic drug level monitoring: Secondary | ICD-10-CM | POA: Diagnosis not present

## 2018-01-19 MED ORDER — LEVETIRACETAM 500 MG PO TABS: 500.0000 mg | ORAL_TABLET | Freq: Two times a day (BID) | ORAL | 11 refills | Status: DC

## 2018-01-19 NOTE — Patient Instructions (Signed)
Continue Keppra at current dose will refill Continue phenobarbital dose will refill Will check phenobarbital level today for therapeutic level/toxicity We will check CBC and CMP for adverse effects of seizure medication Call for any seizure activity Follow-up yearly and as needed

## 2018-01-20 ENCOUNTER — Other Ambulatory Visit: Payer: Self-pay | Admitting: Nurse Practitioner

## 2018-01-20 ENCOUNTER — Telehealth: Payer: Self-pay | Admitting: *Deleted

## 2018-01-20 LAB — CBC WITH DIFFERENTIAL/PLATELET
BASOS ABS: 0 10*3/uL (ref 0.0–0.2)
Basos: 0 %
EOS (ABSOLUTE): 0.3 10*3/uL (ref 0.0–0.4)
Eos: 4 %
Hematocrit: 30.2 % — ABNORMAL LOW (ref 37.5–51.0)
Hemoglobin: 10 g/dL — ABNORMAL LOW (ref 13.0–17.7)
IMMATURE GRANS (ABS): 0.1 10*3/uL (ref 0.0–0.1)
IMMATURE GRANULOCYTES: 1 %
LYMPHS: 35 %
Lymphocytes Absolute: 2.4 10*3/uL (ref 0.7–3.1)
MCH: 28.3 pg (ref 26.6–33.0)
MCHC: 33.1 g/dL (ref 31.5–35.7)
MCV: 86 fL (ref 79–97)
Monocytes Absolute: 1 10*3/uL — ABNORMAL HIGH (ref 0.1–0.9)
Monocytes: 14 %
NEUTROS PCT: 46 %
Neutrophils Absolute: 3.2 10*3/uL (ref 1.4–7.0)
PLATELETS: 462 10*3/uL — AB (ref 150–379)
RBC: 3.53 x10E6/uL — ABNORMAL LOW (ref 4.14–5.80)
RDW: 16.1 % — ABNORMAL HIGH (ref 12.3–15.4)
WBC: 7 10*3/uL (ref 3.4–10.8)

## 2018-01-20 LAB — COMPREHENSIVE METABOLIC PANEL
A/G RATIO: 1.1 — AB (ref 1.2–2.2)
ALT: 15 IU/L (ref 0–44)
AST: 17 IU/L (ref 0–40)
Albumin: 4.1 g/dL (ref 3.5–5.5)
Alkaline Phosphatase: 85 IU/L (ref 39–117)
BUN/Creatinine Ratio: 10 (ref 9–20)
BUN: 9 mg/dL (ref 6–24)
CALCIUM: 9.1 mg/dL (ref 8.7–10.2)
CHLORIDE: 105 mmol/L (ref 96–106)
CO2: 21 mmol/L (ref 20–29)
Creatinine, Ser: 0.93 mg/dL (ref 0.76–1.27)
GFR calc Af Amer: 106 mL/min/{1.73_m2} (ref >59)
GFR calc non Af Amer: 92 mL/min/{1.73_m2} (ref >59)
Globulin, Total: 3.6 g/dL (ref 1.5–4.5)
Glucose: 96 mg/dL (ref 65–99)
POTASSIUM: 4.7 mmol/L (ref 3.5–5.2)
Sodium: 142 mmol/L (ref 134–144)
Total Protein: 7.7 g/dL (ref 6.0–8.5)

## 2018-01-20 LAB — PHENOBARBITAL LEVEL: Phenobarbital, Serum: 22 ug/mL (ref 15–40)

## 2018-01-20 MED ORDER — PHENOBARBITAL 32.4 MG PO TABS: 32.4000 mg | ORAL_TABLET | Freq: Four times a day (QID) | ORAL | 5 refills | Status: DC

## 2018-01-20 NOTE — Telephone Encounter (Signed)
LVM informing patient his labs are okay except his CBC continues to show anemia. The patient is on Ferrous sulfate per medication list. Advised him his phenobarbital level is good, and it has been refilled. Left number for any questions.

## 2018-01-20 NOTE — Telephone Encounter (Signed)
Called patient and discussed that he has history of anemia. He is taking ferrous sulfate prescribed last by Richardson Dopp, PA. Patient stated Kathleen Argue is in his cardiologist's office, and he has appointment there this month. This RN advised he bring lab result to their attention. He verbalized understanding, appreciation for call back.

## 2018-01-20 NOTE — Telephone Encounter (Signed)
Pt has returned the call to [place]. He is asking for a call back

## 2018-01-20 NOTE — Addendum Note (Signed)
Addended by: Minna Antis on: 01/20/2018 03:14 PM   Modules accepted: Orders

## 2018-01-20 NOTE — Telephone Encounter (Signed)
Fax confirmation received for Phenobarbital [facility]  [phone].

## 2018-01-22 ENCOUNTER — Encounter: Payer: Self-pay | Admitting: Family Medicine

## 2018-01-22 ENCOUNTER — Ambulatory Visit (INDEPENDENT_AMBULATORY_CARE_PROVIDER_SITE_OTHER): Payer: Medicare Other | Admitting: Family Medicine

## 2018-01-22 VITALS — BP 122/66 | HR 68 | Temp 99.2°F | Ht 74.0 in | Wt 213.0 lb

## 2018-01-22 DIAGNOSIS — J069 Acute upper respiratory infection, unspecified: Secondary | ICD-10-CM

## 2018-01-22 NOTE — Progress Notes (Signed)
[name] - 57 y.o. male MRN [id number]  Date of birth: 1960-11-06  SUBJECTIVE:  Including CC & ROS.  Chief Complaint  Patient presents with  . Cough    [name] is a 57 y.o. male that is presenting with a cough. Ongoing for two weeks. Denies fever or body aches. He has been around family members with shortness of breath. He has been taking Alka-seltzer plus. Admits to productive cough. No leg swelling or chest pain. Symptoms seem to be improving slightly.    Review of Systems  Constitutional: Negative for fever.  HENT: Positive for congestion.   Respiratory: Positive for cough.     HISTORY: Past Medical, Surgical, Social, and Family History Reviewed & Updated per EMR.   Pertinent Historical Findings include:  Past Medical History:  Diagnosis Date  . Anemia 01/01/2017  . CAD (coronary artery disease)    a. Overlapping BMS-prox LCx in 2009 b. NSTEMI 2012 due to prox LCx ISR s/p PTCA alone; EF 45% w/ basal inf HK. Had run out of Plavix 1 week prior. c. NSTEMI 05/2015 after running out of Plavix - cath with 3V CAD, Patient left AMA prior to agreeing to CABG. // d. LHC 8/16: LM 60; LAD mid 60, D1 75; RI 50, 80; LCx prox stent 100; RCA prox 100; EF 45-50 >> seen by TCTS - declined CABG >> Med Rx.  . Chronic combined systolic and diastolic CHF (congestive heart failure) ([place]) 06/06/2015   a - Echo 8/16:  EF 40-45, inf-lat AK, Gr 2 diastolic dysfunction, mild AI, mod MR // b - TEE 8/16: EF 45-50, inf HK, mod MR, mild TR // c - Echo 4/18: EF 40-45, ant-lat and inf-lat HK, Gr 2 DD, mild AI, mild to mod MR, severe LAE, PASP 29   . Clotting disorder ([place])   . Diabetes mellitus without complication ([place])   . HLD (hyperlipidemia)   . Hyperglycemia A1C 6.0 05/2015.  [name] Hypertension   . MI (myocardial infarction) ([place]) x 7  . Poor compliance   . Seizure disorder ([place])   . Seizures ([place])   . TOBACCO ABUSE 02/22/2009  . Valvular heart disease    a. Echo 05/2015: mild AI, mod MR.    Past  Surgical History:  Procedure Laterality Date  . CARDIAC CATHETERIZATION [name] 05/29/2015   Procedure: Left Heart Cath and Coronary Angiography;  Surgeon: Belva Crome, MD;  Location: [facility];  Service: Cardiovascular;  Laterality: [name];  . COLONOSCOPY    . CORONARY ANGIOPLASTY  03/2011   PTCA-prox LCx ISR  . CORONARY ANGIOPLASTY WITH STENT PLACEMENT  10/2007   BMS-prox LCx  . LEFT HEART CATHETERIZATION WITH CORONARY ANGIOGRAM [name] 10/13/2013   Procedure: LEFT HEART CATHETERIZATION WITH CORONARY ANGIOGRAM;  Surgeon: Blane Ohara, MD;  Location: [facility] CATH LAB;  Service: Cardiovascular;  Laterality: [name];  . TEE WITHOUT CARDIOVERSION [name] 06/16/2015   Procedure: TRANSESOPHAGEAL ECHOCARDIOGRAM (TEE);  Surgeon: Josue Hector, MD;  Location: [facility] ENDOSCOPY;  Service: Cardiovascular;  Laterality: [name];    No Known Allergies  Family History  Problem Relation Age of Onset  . Coronary artery disease Maternal Aunt   . Heart disease Maternal Uncle   . Stroke Maternal Uncle   . Heart attack Maternal Uncle        X3  . Hypertension Unknown   . Colon cancer Neg Hx   . Stomach cancer Neg Hx   . Rectal cancer Neg Hx   . Pancreatic cancer  Neg Hx      Social History   Socioeconomic History  . Marital status: Single    Spouse name: Not on file  . Number of children: 0  . Years of education: 12th  . Highest education level: Not on file  Occupational History  . Occupation: former Dealer  Social Needs  . Financial resource strain: Not on file  . Food insecurity:    Worry: Not on file    Inability: Not on file  . Transportation needs:    Medical: Not on file    Non-medical: Not on file  Tobacco Use  . Smoking status: Current Some Day Smoker    Years: 35.00    Types: Cigars  . Smokeless tobacco: Never Used  . Tobacco comment: smoke cigars 2 per day  Substance and Sexual Activity  . Alcohol use: No    Alcohol/week: 0.0 oz  . Drug use: No  . Sexual activity: Not Currently    Lifestyle  . Physical activity:    Days per week: Not on file    Minutes per session: Not on file  . Stress: Not on file  Relationships  . Social connections:    Talks on phone: Not on file    Gets together: Not on file    Attends religious service: Not on file    Active member of club or organization: Not on file    Attends meetings of clubs or organizations: Not on file    Relationship status: Not on file  . Intimate partner violence:    Fear of current or ex partner: Not on file    Emotionally abused: Not on file    Physically abused: Not on file    Forced sexual activity: Not on file  Other Topics Concern  . Not on file  Social History Narrative   Fun/Hobby: Likes the lake or ocean.      PHYSICAL EXAM:  VS: BP 122/66 (BP Location: Left Arm, Patient Position: Sitting, Cuff Size: Small)   Pulse 68   Temp 99.2 F (37.3 C) (Oral)   Ht 6\' 2"  (1.88 m)   Wt 213 lb (96.6 kg)   SpO2 99%   BMI 27.35 kg/m  Physical Exam Gen: NAD, alert, cooperative with exam,  ENT: normal lips, normal nasal mucosa, tympanic membranes clear and intact bilaterally, normal oropharynx, Eye: normal EOM, normal conjunctiva and lids CV:  no edema, +2 pedal pulses, regular rate and rhythm, S1-S2   Resp: no accessory muscle use, non-labored, clear to auscultation bilaterally, no crackles or wheezes  Skin: no rashes, no areas of induration  Neuro: normal tone, normal sensation to touch Psych:  normal insight, alert and oriented MSK: Normal gait, normal strength       ASSESSMENT & PLAN:   Upper respiratory tract infection Likely viral in nature  - counseled on supportive care  - given indications to follow up.

## 2018-01-22 NOTE — Patient Instructions (Signed)
Please try afrin which will help with nasal congestion but use for only three days.   Please also try using a netti pot on a regular occasion.  Honey can help with a sore throat.   Vick's and Delsym can help with cough   You can try using a humidifier.

## 2018-01-23 ENCOUNTER — Other Ambulatory Visit: Payer: Self-pay | Admitting: Physician Assistant

## 2018-01-23 DIAGNOSIS — J069 Acute upper respiratory infection, unspecified: Secondary | ICD-10-CM | POA: Insufficient documentation

## 2018-01-23 NOTE — Assessment & Plan Note (Signed)
Likely viral in nature  - counseled on supportive care  - given indications to follow up.

## 2018-01-29 ENCOUNTER — Ambulatory Visit (HOSPITAL_COMMUNITY): Payer: Medicare Other | Attending: Cardiovascular Disease

## 2018-01-29 ENCOUNTER — Other Ambulatory Visit: Payer: Self-pay

## 2018-01-29 DIAGNOSIS — I252 Old myocardial infarction: Secondary | ICD-10-CM | POA: Insufficient documentation

## 2018-01-29 DIAGNOSIS — I340 Nonrheumatic mitral (valve) insufficiency: Secondary | ICD-10-CM | POA: Diagnosis not present

## 2018-01-29 DIAGNOSIS — I110 Hypertensive heart disease with heart failure: Secondary | ICD-10-CM | POA: Diagnosis not present

## 2018-01-29 DIAGNOSIS — I5042 Chronic combined systolic (congestive) and diastolic (congestive) heart failure: Secondary | ICD-10-CM | POA: Insufficient documentation

## 2018-01-29 DIAGNOSIS — I083 Combined rheumatic disorders of mitral, aortic and tricuspid valves: Secondary | ICD-10-CM | POA: Diagnosis not present

## 2018-01-29 DIAGNOSIS — Z720 Tobacco use: Secondary | ICD-10-CM | POA: Insufficient documentation

## 2018-01-29 DIAGNOSIS — E785 Hyperlipidemia, unspecified: Secondary | ICD-10-CM | POA: Diagnosis not present

## 2018-01-29 DIAGNOSIS — E119 Type 2 diabetes mellitus without complications: Secondary | ICD-10-CM | POA: Insufficient documentation

## 2018-01-30 ENCOUNTER — Encounter: Payer: Self-pay | Admitting: Physician Assistant

## 2018-01-30 ENCOUNTER — Telehealth: Payer: Self-pay | Admitting: *Deleted

## 2018-01-30 NOTE — Telephone Encounter (Signed)
-----   Message from Liliane Shi, [place] sent at 01/30/2018  1:50 PM EDT ----- Please call the patient. Echocardiogram demonstrates stable heart function (EF unchanged from previous echo at 40-45%).  There is moderate leakage of the mitral valve.  This is unchanged from the previous echocardiogram. Continue current medications and follow up as planned.  Please fax a copy to PCP:  [place], FNP  Richardson Dopp, PA-C    01/30/2018 1:46 PM

## 2018-01-30 NOTE — Telephone Encounter (Signed)
Left message to go over echo results. I will forward a copy of results to Mauricio Po, FNP

## 2018-02-02 NOTE — Telephone Encounter (Signed)
-----   Message from Liliane Shi, [place] sent at 01/30/2018  1:50 PM EDT ----- Please call the patient. Echocardiogram demonstrates stable heart function (EF unchanged from previous echo at 40-45%).  There is moderate leakage of the mitral valve.  This is unchanged from the previous echocardiogram. Continue current medications and follow up as planned.  Please fax a copy to PCP:  [place], FNP  Richardson Dopp, PA-C    01/30/2018 1:46 PM

## 2018-02-02 NOTE — Telephone Encounter (Signed)
Pt has been notified of echo results by phone with verbal understanding. Pt thanked me for my call.

## 2018-02-02 NOTE — Telephone Encounter (Signed)
Follow Up:   Returning your call from [place] his Echo results.

## 2018-02-12 ENCOUNTER — Other Ambulatory Visit: Payer: Self-pay | Admitting: Physician Assistant

## 2018-02-16 ENCOUNTER — Ambulatory Visit (INDEPENDENT_AMBULATORY_CARE_PROVIDER_SITE_OTHER): Payer: Medicare Other | Admitting: Physician Assistant

## 2018-02-16 ENCOUNTER — Encounter: Payer: Self-pay | Admitting: Physician Assistant

## 2018-02-16 VITALS — BP 130/80 | HR 84 | Ht 74.0 in | Wt 218.4 lb

## 2018-02-16 DIAGNOSIS — E7800 Pure hypercholesterolemia, unspecified: Secondary | ICD-10-CM

## 2018-02-16 DIAGNOSIS — I10 Essential (primary) hypertension: Secondary | ICD-10-CM

## 2018-02-16 DIAGNOSIS — I2510 Atherosclerotic heart disease of native coronary artery without angina pectoris: Secondary | ICD-10-CM

## 2018-02-16 DIAGNOSIS — I5042 Chronic combined systolic (congestive) and diastolic (congestive) heart failure: Secondary | ICD-10-CM

## 2018-02-16 DIAGNOSIS — F17200 Nicotine dependence, unspecified, uncomplicated: Secondary | ICD-10-CM | POA: Diagnosis not present

## 2018-02-16 NOTE — Patient Instructions (Signed)
Medication Instructions:  No changes  Labwork: In 2 weeks - CMET, Lipids (do not eat or drink anything after midnight the night before your lab test)  Testing/Procedures: None   Follow-Up: Sherren Mocha, MD or Richardson Dopp, PA-C in 6 months   Any Other Special Instructions Will Be Listed Below (If Applicable).  If you need a refill on your cardiac medications before your next appointment, please call your pharmacy.

## 2018-02-16 NOTE — Progress Notes (Signed)
Cardiology Office Note:    Date:  02/16/2018   ID:  DENO SIDA, DOB 07/21/1961, MRN [id number]  PCP:  Golden Circle, FNP  Cardiologist:  Sherren Mocha, MD   Referring MD: Golden Circle, FNP   Chief Complaint  Patient presents with  . Follow-up    CAD, CHF    History of Present Illness:    RAYQUAN AMRHEIN is a 57 y.o. male with CAD status post BMS x 2 to the prox CFX in 2009, non-STEMI 2012 due to ISR of CFX stents (off Plavix) treated with angioplasty, ischemic cardiomyopathy, HTN, HL, seizure disorder, reported "clotting disorder," tobacco abuse, noncompliance. Cardiac cathin 09/2013 demonstrated patent stents and nonobstructive disease elsewhere. He was then admitted in 2016 with NSTEMI and LHC demonstrated 3V CAD with total occlusion of the RCA and LCx and mod stenosis of the LM and LAD. He also has mod MR. He was seen by TCTS and he declined CABG.  He was last seen 10/18.  Follow-up echo in 4/19 demonstrated EF 40-45 and moderate mitral regurgitation.  Mr. Fernandez returns for follow-up.  He is here alone.  He denies chest discomfort, significant dyspnea, lower extremity swelling, PND or syncope.  He continues to smoke.  Prior CV studies:   The following studies were reviewed today:  Echo 01/29/2018 Mild concentric LVH, EF 40-45, anterolateral and inferolateral HK, mild AI, moderate MR, mild LAE, normal RVSF, mild TR, PASP 25  Echocardiogram 01/31/17 EF 40-45, anterolateral/inferolateral hypokinesis, grade 2 diastolic dysfunction, mild AI, mild to moderate MR, severe LAE, normal RV SF, PASP 29  TEE 8/16 EF 45-50, inf HK, mod MR, mild TR  LHC 8/16 LM 60 LAD mid 60, D1 75 RI 50, 80 LCx prox stent 100 RCA prox 100 EF 45-50  Echo 8/16 EF 40-45, inf-lat AK, Gr 2 diastolic dysfunction, mild AI, mod MR  Past Medical History:  Diagnosis Date  . Anemia 01/01/2017  . CAD (coronary artery disease)    a. Overlapping BMS-prox LCx in 2009 b. NSTEMI 2012 due to prox  LCx ISR s/p PTCA alone; EF 45% w/ basal inf HK. Had run out of Plavix 1 week prior. c. NSTEMI 05/2015 after running out of Plavix - cath with 3V CAD, Patient left AMA prior to agreeing to CABG. // d. LHC 8/16: LM 60; LAD mid 60, D1 75; RI 50, 80; LCx prox stent 100; RCA prox 100; EF 45-50 >> seen by TCTS - declined CABG >> Med Rx.  . Chronic combined systolic and diastolic CHF (congestive heart failure) ([place]) 06/06/2015   a - Echo 8/16:  EF 40-45, inf-lat AK, Gr 2 diastolic dysfunction, mild AI, mod MR // b - TEE 8/16: EF 45-50, inf HK, mod MR, mild TR // c - Echo 4/18: EF 40-45, ant-lat and inf-lat HK, Gr 2 DD, mild AI, mild to mod MR, severe LAE, PASP 29 // d - Echo 4/19: Mild concentric LVH, EF 40-45, anterolateral/inferolateral HK, mild AI, moderate MR, mild LAE, mild TR, PASP 25   . Clotting disorder ([place])   . Diabetes mellitus without complication ([place])   . HLD (hyperlipidemia)   . Hyperglycemia A1C 6.0 05/2015.  [name] Hypertension   . MI (myocardial infarction) ([place]) x 7  . Poor compliance   . Seizure disorder ([place])   . Seizures ([place])   . TOBACCO ABUSE 02/22/2009  . Valvular heart disease    a. Echo 05/2015: mild AI, mod MR.   Surgical Hx: The patient  has  a past surgical history that includes Coronary angioplasty with stent (10/2007); Coronary angioplasty (03/2011); left heart catheterization with coronary angiogram ([name], 10/13/2013); Cardiac catheterization ([name], 05/29/2015); TEE without cardioversion ([name], 06/16/2015); and Colonoscopy.   Current Medications: Current Meds  Medication Sig  . AMBULATORY NON FORMULARY MEDICATION Medication Name: Black seed oil  . atorvastatin (LIPITOR) 20 MG tablet Take 1 tablet (20 mg total) by mouth daily.  . carvedilol (COREG) 3.125 MG tablet Take 1 tablet (3.125 mg total) by mouth 2 (two) times daily.  . clopidogrel (PLAVIX) 75 MG tablet take 1 tablet by mouth once daily  . ferrous sulfate 325 (65 FE) MG tablet Take 1 tablet (325 mg total) by mouth daily with  breakfast.  . Flaxseed, Linseed, (FLAX PO) Take by mouth.  . furosemide (LASIX) 40 MG tablet Take 40 mg by mouth daily as needed for edema. Take for weight gain (3 lbs or more in 1 day) or leg swelling  . isosorbide mononitrate (IMDUR) 30 MG 24 hr tablet Take 0.5 tablets (15 mg total) by mouth daily.  [name] levETIRAcetam (KEPPRA) 500 MG tablet Take 1 tablet (500 mg total) by mouth 2 (two) times daily.  [name] lisinopril (PRINIVIL,ZESTRIL) 2.5 MG tablet TAKE 1 TABLET BY MOUTH ONCE DAILY  . nitroGLYCERIN (NITROSTAT) 0.4 MG SL tablet place 1 tablet under the tongue if needed every 5 minutes for chest pain for 3 doses  . OVER THE COUNTER MEDICATION Take 1 drop by mouth daily. BLACKSEED OIL 1 DROP BY MOUTH ONCE DAILY  . PHENobarbital (LUMINAL) 32.4 MG tablet Take 1 tablet (32.4 mg total) by mouth 4 (four) times daily.     Allergies:   Patient has no known allergies.   Social History   Tobacco Use  . Smoking status: Current Some Day Smoker    Years: 35.00    Types: Cigars  . Smokeless tobacco: Never Used  . Tobacco comment: smoke cigars 2 per day  Substance Use Topics  . Alcohol use: No    Alcohol/week: 0.0 oz  . Drug use: No     Family Hx: The patient's family history includes Coronary artery disease in his maternal aunt; Heart attack in his maternal uncle; Heart disease in his maternal uncle; Hypertension in his unknown relative; Stroke in his maternal uncle. There is no history of Colon cancer, Stomach cancer, Rectal cancer, or Pancreatic cancer.  ROS:   Please see the history of present illness.    ROS All other systems reviewed and are negative.   EKGs/Labs/Other Test Reviewed:    EKG:  EKG is not ordered today.    Recent Labs: 01/19/2018: ALT 15; BUN 9; Creatinine, Ser 0.93; Hemoglobin 10.0; Platelets 462; Potassium 4.7; Sodium 142   Recent Lipid Panel Lab Results  Component Value Date/Time   CHOL 216 (H) 12/04/2016 12:19 PM   TRIG 66 12/04/2016 12:19 PM   HDL 38 (L) 12/04/2016  12:19 PM   CHOLHDL 5.7 (H) 12/04/2016 12:19 PM   CHOLHDL 4.5 04/07/2011 01:51 AM   LDLCALC 165 (H) 12/04/2016 12:19 PM    Physical Exam:    VS:  BP 130/80 (BP Location: Right Arm, Patient Position: Sitting)   Pulse 84   Ht 6\' 2"  (1.88 m)   Wt 218 lb 6.4 oz (99.1 kg)   SpO2 99%   BMI 28.04 kg/m     Wt Readings from Last 3 Encounters:  02/16/18 218 lb 6.4 oz (99.1 kg)  01/22/18 213 lb (96.6 kg)  01/19/18 213 lb 9.6 oz (  96.9 kg)     Physical Exam  Constitutional: He is oriented to person, place, and time. He appears well-developed and well-nourished. No distress.  HENT:  Head: Normocephalic and atraumatic.  Neck: Neck supple. No JVD present.  Cardiovascular: Normal rate, regular rhythm, S1 normal and S2 normal.  Murmur heard.  Holosystolic murmur is present with a grade of 2/6 at the lower left sternal border. Pulmonary/Chest: Breath sounds normal. He has no rales.  Abdominal: Soft. There is no hepatomegaly.  Musculoskeletal: He exhibits no edema.  Neurological: He is alert and oriented to person, place, and time.  Skin: Skin is warm and dry.    ASSESSMENT & PLAN:    Chronic combined systolic and diastolic CHF (congestive heart failure) (HCC) EF 40-45.  NYHA 2.  Volume is stable.  Continue current dose of carvedilol, lisinopril, isosorbide.  Coronary artery disease involving native coronary artery of native heart without angina pectoris History of three-vessel CAD treated medically.  He denies anginal symptoms.  Continue Clopidogrel, carvedilol, atorvastatin.  Essential hypertension  The patient's blood pressure is controlled on his current regimen.  Continue current therapy.   Pure hypercholesterolemia  Continue atorvastatin.  Arrange follow-up CMET, lipids.  TOBACCO ABUSE I again recommended cessation.   Dispo:  Return in about 6 months (around 08/18/2018) for Routine Follow Up, w/ Dr. Burt Knack, or Richardson Dopp, PA-C.   Medication Adjustments/Labs and Tests  Ordered: Current medicines are reviewed at length with the patient today.  Concerns regarding medicines are outlined above.  Tests Ordered: Orders Placed This Encounter  Procedures  . Comprehensive metabolic panel  . Lipid panel   Medication Changes: No orders of the defined types were placed in this encounter.   Signed, Richardson Dopp, PA-C  02/16/2018 4:45 PM    [facility] [address], [place], [place]  [address] Phone: [phone]; Fax: [phone]

## 2018-03-17 ENCOUNTER — Other Ambulatory Visit: Payer: Self-pay | Admitting: Physician Assistant

## 2018-03-17 MED ORDER — FUROSEMIDE 40 MG PO TABS: 40.0000 mg | ORAL_TABLET | Freq: Every day | ORAL | 3 refills | Status: DC | PRN

## 2018-03-17 NOTE — Telephone Encounter (Signed)
New Message:        *STAT* If patient is at the pharmacy, call can be transferred to refill team.   1. Which medications need to be refilled? (please list name of each medication and dose if known) furosemide (LASIX) 40 MG tablet  2. Which pharmacy/location (including street and city if local pharmacy) is medication to be sent to?[facility] [phone] - [place], [place] - [address] AT [place]  3. Do they need a 30 day or 90 day supply? [place]

## 2018-03-17 NOTE — Telephone Encounter (Signed)
Pt's medication was sent to pt's pharmacy as requested. Confirmation received.  °

## 2018-04-25 ENCOUNTER — Other Ambulatory Visit: Payer: Self-pay | Admitting: Physician Assistant

## 2018-04-29 ENCOUNTER — Encounter: Payer: Self-pay | Admitting: Nurse Practitioner

## 2018-04-29 ENCOUNTER — Ambulatory Visit (INDEPENDENT_AMBULATORY_CARE_PROVIDER_SITE_OTHER): Payer: Medicare Other | Admitting: Nurse Practitioner

## 2018-04-29 ENCOUNTER — Other Ambulatory Visit (INDEPENDENT_AMBULATORY_CARE_PROVIDER_SITE_OTHER): Payer: Medicare Other

## 2018-04-29 VITALS — BP 124/80 | HR 81 | Temp 99.3°F | Resp 16 | Ht 74.0 in | Wt 220.0 lb

## 2018-04-29 DIAGNOSIS — R7989 Other specified abnormal findings of blood chemistry: Secondary | ICD-10-CM

## 2018-04-29 DIAGNOSIS — Z1159 Encounter for screening for other viral diseases: Secondary | ICD-10-CM

## 2018-04-29 DIAGNOSIS — Z125 Encounter for screening for malignant neoplasm of prostate: Secondary | ICD-10-CM | POA: Diagnosis not present

## 2018-04-29 DIAGNOSIS — Z9189 Other specified personal risk factors, not elsewhere classified: Secondary | ICD-10-CM

## 2018-04-29 DIAGNOSIS — E7800 Pure hypercholesterolemia, unspecified: Secondary | ICD-10-CM

## 2018-04-29 DIAGNOSIS — I10 Essential (primary) hypertension: Secondary | ICD-10-CM

## 2018-04-29 LAB — COMPREHENSIVE METABOLIC PANEL
ALBUMIN: 4.5 g/dL (ref 3.5–5.2)
ALK PHOS: 79 U/L (ref 39–117)
ALT: 15 U/L (ref 0–53)
AST: 13 U/L (ref 0–37)
BILIRUBIN TOTAL: 0.3 mg/dL (ref 0.2–1.2)
BUN: 14 mg/dL (ref 6–23)
CALCIUM: 9.4 mg/dL (ref 8.4–10.5)
CHLORIDE: 103 meq/L (ref 96–112)
CO2: 29 mEq/L (ref 19–32)
Creatinine, Ser: 1.17 mg/dL (ref 0.40–1.50)
GFR: 82.9 mL/min (ref >60.00)
Glucose, Bld: 109 mg/dL — ABNORMAL HIGH (ref 70–99)
Potassium: 4 mEq/L (ref 3.5–5.1)
Sodium: 139 mEq/L (ref 135–145)
Total Protein: 8.1 g/dL (ref 6.0–8.3)

## 2018-04-29 LAB — CBC
HCT: 35.5 % — ABNORMAL LOW (ref 39.0–52.0)
Hemoglobin: 12 g/dL — ABNORMAL LOW (ref 13.0–17.0)
MCHC: 33.9 g/dL (ref 30.0–36.0)
MCV: 86.8 fl (ref 78.0–100.0)
Platelets: 400 10*3/uL (ref 150.0–400.0)
RBC: 4.08 Mil/uL — ABNORMAL LOW (ref 4.22–5.81)
RDW: 16.7 % — AB (ref 11.5–15.5)
WBC: 6.6 10*3/uL (ref 4.0–10.5)

## 2018-04-29 LAB — IRON: IRON: 52 ug/dL (ref 42–165)

## 2018-04-29 LAB — VITAMIN B12: Vitamin B-12: 148 pg/mL — ABNORMAL LOW (ref 211–911)

## 2018-04-29 LAB — PSA, MEDICARE: PSA: 1.14 ng/mL (ref 0.10–4.00)

## 2018-04-29 LAB — FERRITIN: FERRITIN: 44 ng/mL (ref 22.0–322.0)

## 2018-04-29 NOTE — Assessment & Plan Note (Signed)
Continue routine F/U with cardiology - CBC; Future - Comprehensive metabolic panel; Future

## 2018-04-29 NOTE — Progress Notes (Signed)
Name: [name]   MRN: [id number]    DOB: Aug 26, 1961   Date:04/29/2018       Progress Note  Subjective  Chief Complaint  Chief Complaint  Patient presents with  . Establish Care    HPI Mr [name] is here today to establish care with me as new PCP, transferring from a prior provider in our practice. 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 Aside from primary care, he is routinely followed by cardiology for chronic combined systolic and diastolic heart failure, coronary artery disease, essential hypertension, and hypercholesterolemia, and  Neurology for seizure disorder. All of his daily medications are currently written by specialty providers. On chart review, he was following with gastroenterology in April of 2018 for anemia which was to be re-evaluated by his PCP last year but it appears his routine labs were not repeated until April of 2019 by cardiology, and his CBC showed continued anemia. He has been maintained on daily iron supplement for some time now, is currently taking an OTC iron supplement that he likes. We will update his anemia labs today and follow up on routine health maintenance as well. He tells me that he overall feels well today and does not have any complaints.   Patient Active Problem List   Diagnosis Date Noted  . Upper respiratory tract infection 01/23/2018  . Therapeutic drug monitoring 01/19/2018  . Dental infection 06/26/2017  . Iron deficiency anemia 01/01/2017  . Essential hypertension 06/06/2015  . Chronic combined systolic and diastolic CHF (congestive heart failure) ([place]) 06/06/2015  . H/O noncompliance with medical treatment, presenting hazards to health 06/06/2015  . Mitral regurgitation 06/06/2015  . Coronary artery disease involving native coronary artery of native heart without angina pectoris   . History of MI (myocardial infarction) 05/27/2015  . Seizure disorder ([place]) 05/27/2015  . Refractory epilepsy ([place]) 01/20/2014  . Ischemic  cardiomyopathy 12/01/2013  . Testicular pain, right 08/27/2013  . TOBACCO ABUSE 02/22/2009  . Pure hypercholesterolemia 02/06/2009  . SEIZURE DISORDER 02/06/2009    Past Surgical History:  Procedure Laterality Date  . CARDIAC CATHETERIZATION [name] 05/29/2015   Procedure: Left Heart Cath and Coronary Angiography;  Surgeon: Belva Crome, MD;  Location: [facility];  Service: Cardiovascular;  Laterality: [name];  . COLONOSCOPY    . CORONARY ANGIOPLASTY  03/2011   PTCA-prox LCx ISR  . CORONARY ANGIOPLASTY WITH STENT PLACEMENT  10/2007   BMS-prox LCx  . LEFT HEART CATHETERIZATION WITH CORONARY ANGIOGRAM [name] 10/13/2013   Procedure: LEFT HEART CATHETERIZATION WITH CORONARY ANGIOGRAM;  Surgeon: Blane Ohara, MD;  Location: [facility] CATH LAB;  Service: Cardiovascular;  Laterality: [name];  . TEE WITHOUT CARDIOVERSION [name] 06/16/2015   Procedure: TRANSESOPHAGEAL ECHOCARDIOGRAM (TEE);  Surgeon: Josue Hector, MD;  Location: [facility] ENDOSCOPY;  Service: Cardiovascular;  Laterality: [name];    Family History  Problem Relation Age of Onset  . Coronary artery disease Maternal Aunt   . Heart disease Maternal Uncle   . Stroke Maternal Uncle   . Heart attack Maternal Uncle        X3  . Hypertension Unknown   . Colon cancer Neg Hx   . Stomach cancer Neg Hx   . Rectal cancer Neg Hx   . Pancreatic cancer Neg Hx     Social History   Socioeconomic History  . Marital status: Single    Spouse name: Not on file  . Number of children: 0  . Years of education: 12th  .  Highest education level: Not on file  Occupational History  . Occupation: former Dealer  Social Needs  . Financial resource strain: Not on file  . Food insecurity:    Worry: Not on file    Inability: Not on file  . Transportation needs:    Medical: Not on file    Non-medical: Not on file  Tobacco Use  . Smoking status: Current Some Day Smoker    Years: 35.00    Types: Cigars  . Smokeless tobacco: Never Used  . Tobacco comment: smoke  cigars 2 per day  Substance and Sexual Activity  . Alcohol use: No  . Drug use: No  . Sexual activity: Not Currently  Lifestyle  . Physical activity:    Days per week: Not on file    Minutes per session: Not on file  . Stress: Not on file  Relationships  . Social connections:    Talks on phone: Not on file    Gets together: Not on file    Attends religious service: Not on file    Active member of club or organization: Not on file    Attends meetings of clubs or organizations: Not on file    Relationship status: Not on file  . Intimate partner violence:    Fear of current or ex partner: Not on file    Emotionally abused: Not on file    Physically abused: Not on file    Forced sexual activity: Not on file  Other Topics Concern  . Not on file  Social History Narrative   Fun/Hobby: Likes the lake or ocean.      Current Outpatient Medications:  .  AMBULATORY NON FORMULARY MEDICATION, Medication Name: Black seed oil, Disp: , Rfl:  .  atorvastatin (LIPITOR) 20 MG tablet, Take 1 tablet (20 mg total) by mouth daily., Disp: 90 tablet, Rfl: 2 .  carvedilol (COREG) 3.125 MG tablet, Take 1 tablet (3.125 mg total) by mouth 2 (two) times daily., Disp: 180 tablet, Rfl: 1 .  clopidogrel (PLAVIX) 75 MG tablet, take 1 tablet by mouth once daily, Disp: 90 tablet, Rfl: 2 .  ferrous sulfate 325 (65 FE) MG tablet, Take 1 tablet (325 mg total) by mouth daily with breakfast., Disp: 90 tablet, Rfl: 3 .  Flaxseed, Linseed, (FLAX PO), Take by mouth., Disp: , Rfl:  .  furosemide (LASIX) 40 MG tablet, Take 1 tablet (40 mg total) by mouth daily as needed for edema. Take for weight gain (3 lbs or more in 1 day) or leg swelling, Disp: 90 tablet, Rfl: 3 .  isosorbide mononitrate (IMDUR) 30 MG 24 hr tablet, Take 0.5 tablets (15 mg total) by mouth daily., Disp: 15 tablet, Rfl: 10 .  levETIRAcetam (KEPPRA) 500 MG tablet, Take 1 tablet (500 mg total) by mouth 2 (two) times daily., Disp: 60 tablet, Rfl: 11 .   lisinopril (PRINIVIL,ZESTRIL) 2.5 MG tablet, TAKE 1 TABLET BY MOUTH ONCE DAILY, Disp: 90 tablet, Rfl: 2 .  nitroGLYCERIN (NITROSTAT) 0.4 MG SL tablet, place 1 tablet under the tongue if needed every 5 minutes for chest pain for 3 doses, Disp: 25 tablet, Rfl: 3 .  OVER THE COUNTER MEDICATION, Take 1 drop by mouth daily. BLACKSEED OIL 1 DROP BY MOUTH ONCE DAILY, Disp: , Rfl:  .  PHENobarbital (LUMINAL) 32.4 MG tablet, Take 1 tablet (32.4 mg total) by mouth 4 (four) times daily., Disp: 120 tablet, Rfl: 5  No Known Allergies   Review of Systems  Constitutional: Negative for  chills and fever.  Respiratory: Negative for shortness of breath.   Cardiovascular: Negative for chest pain.  Gastrointestinal: Negative for abdominal pain, blood in stool, diarrhea, nausea and vomiting.  Skin: Negative for rash.  Neurological: Negative for dizziness and weakness.  Endo/Heme/Allergies: Does not bruise/bleed easily.   Objective  Vitals:   04/29/18 1330  BP: 124/80  Pulse: 81  Resp: 16  Temp: 99.3 F (37.4 C)  TempSrc: Oral  SpO2: 98%  Weight: 220 lb (99.8 kg)  Height: 6\' 2"  (1.88 m)    Body mass index is 28.25 kg/m.  Physical Exam Vital signs reviewed. Constitutional: Patient appears well-developed and well-nourished. No distress.  HENT: Head: Normocephalic and atraumatic. Nose: Nose normal. Mouth/Throat: Oropharynx is clear and moist. No oropharyngeal exudate.  Eyes: Conjunctivae and EOM are normal. Pupils are equal, round, and reactive to light. No scleral icterus.  Neck: Normal range of motion. Neck supple.  Cardiovascular: Normal rate, regular rhythm and normal heart sounds.  Distal pulses intact. Pulmonary/Chest: Effort normal and breath sounds normal. No respiratory distress. Abdominal: Soft. No distension. Musculoskeletal: Normal range of motion,  No gross deformities Neurological: He is alert and oriented to person, place, and time. No cranial nerve deficit. Coordination, balance,  strength, speech and gait are normal.  Skin: Skin is warm and dry. No rash noted. No erythema.  Psychiatric: Patient has a normal mood and affect. behavior is normal. Judgment and thought content normal.   Assessment & Plan F/U TBD pending lab results  -Reviewed Health Maintenance:  Declines HIV screening Colonoscopy up to date Encounter for hepatitis C virus screening test for high risk patient- Hepatitis C antibody; Future Screening for prostate cancer- PSA, Medicare; Future  Abnormal CBC Update labs F/U with further recommendations pending lab results - CBC; Future - Vitamin B12; Future - Iron; Future - Ferritin; Future - Comprehensive metabolic panel; Future

## 2018-04-29 NOTE — Patient Instructions (Addendum)
Please head downstairs for lab work. If any of your test results are critically abnormal, you will be contacted right away. Otherwise, I will contact you within a week about your test results and any recommendations for abnormalities.  We will decide when you should come back to see me after I get your lab results back.  It was nice to see you. Thanks for letting me take care of you today :)

## 2018-04-29 NOTE — Assessment & Plan Note (Signed)
Stable Continue routine F/U with cardiology - CBC; Future - Comprehensive metabolic panel; Future

## 2018-04-30 ENCOUNTER — Other Ambulatory Visit: Payer: Self-pay | Admitting: Cardiovascular Disease

## 2018-04-30 DIAGNOSIS — Z9119 Patient's noncompliance with other medical treatment and regimen: Secondary | ICD-10-CM

## 2018-04-30 DIAGNOSIS — I25118 Atherosclerotic heart disease of native coronary artery with other forms of angina pectoris: Secondary | ICD-10-CM

## 2018-04-30 DIAGNOSIS — Z91199 Patient's noncompliance with other medical treatment and regimen due to unspecified reason: Secondary | ICD-10-CM

## 2018-04-30 DIAGNOSIS — I10 Essential (primary) hypertension: Secondary | ICD-10-CM

## 2018-04-30 DIAGNOSIS — I214 Non-ST elevation (NSTEMI) myocardial infarction: Secondary | ICD-10-CM

## 2018-04-30 DIAGNOSIS — I340 Nonrheumatic mitral (valve) insufficiency: Secondary | ICD-10-CM

## 2018-04-30 LAB — HEPATITIS C ANTIBODY
Hepatitis C Ab: NONREACTIVE
SIGNAL TO CUT-OFF: 0.04 (ref <1.00)

## 2018-05-04 ENCOUNTER — Other Ambulatory Visit: Payer: Self-pay | Admitting: Nurse Practitioner

## 2018-05-04 DIAGNOSIS — E538 Deficiency of other specified B group vitamins: Secondary | ICD-10-CM

## 2018-05-04 DIAGNOSIS — R7989 Other specified abnormal findings of blood chemistry: Secondary | ICD-10-CM

## 2018-05-05 ENCOUNTER — Telehealth: Payer: Self-pay | Admitting: *Deleted

## 2018-05-05 NOTE — Telephone Encounter (Signed)
Pt called back after receiving lab results. States he has decided NOT to go ahead with the B12 injections. He will try PO supplement. Also questioning if HIV screening was done with lab draw. He states he thought it was ordered.

## 2018-05-05 NOTE — Telephone Encounter (Signed)
patient is requesting a call back in regards to not doing his B 12 injections any longer.  He stated he doesn't want those any longer. Please advise

## 2018-05-07 NOTE — Telephone Encounter (Signed)
That is his choice about the B12 injections- please take a daily B12 supplement; would recommend that he get his level re-checked in 1 month; I do not see that HIV was ordered; this can be done when he comes back for B12 re-check.  If he has further questions or concerns, please schedule OV with Ashleigh.

## 2018-05-07 NOTE — Telephone Encounter (Signed)
Routing to [place], please advise, thanks

## 2018-05-08 NOTE — Telephone Encounter (Signed)
Spoke with patient and info given 

## 2018-07-08 NOTE — Progress Notes (Signed)
Subjective:   [name] is a 57 y.o. male who presents for Medicare Annual/Subsequent preventive examination.  Review of Systems:  No ROS.  Medicare Wellness Visit. Additional risk factors are reflected in the social history.  Cardiac Risk Factors include: advanced age (>35men, >68 women);dyslipidemia;hypertension;male gender;smoking/ tobacco exposure Sleep patterns: feels rested on waking, gets up 1 times nightly to void and sleeps 8-9 hours nightly.    Home Safety/Smoke Alarms: Feels safe in home. Smoke alarms in place.  Living environment; residence and Firearm Safety: apartment, no firearms. Lives with mother, no needs for DME, good support system Seat Belt Safety/Bike Helmet: Wears seat belt.   PSA-  Lab Results  Component Value Date   PSA 1.14 04/29/2018       Objective:    Vitals: BP (!) 124/58   Pulse 78   Resp 17   Ht 6\' 2"  (1.88 m)   Wt 223 lb (101.2 kg)   SpO2 99%   BMI 28.63 kg/m   Body mass index is 28.63 kg/m.  Advanced Directives 07/09/2018 06/25/2017 06/30/2016 04/20/2016 06/16/2015 05/27/2015 05/26/2015  Does Patient Have a Medical Advance Directive? No No No No No No No  Does patient want to make changes to medical advance directive? Yes (ED - Information included in AVS) - - - - - -  Would patient like information on creating a medical advance directive? - Yes (ED - Information included in AVS) No - patient declined information - - No - patient declined information -  Pre-existing out of facility DNR order (yellow form or pink MOST form) - - - - - - -    Tobacco Social History   Tobacco Use  Smoking Status Current Some Day Smoker  . Years: 35.00  . Types: Cigars  Smokeless Tobacco Never Used  Tobacco Comment   smoke cigars 2 per day     Ready to quit: No Counseling given: Yes Comment: smoke cigars 2 per day  Past Medical History:  Diagnosis Date  . Anemia 01/01/2017  . CAD (coronary artery disease)    a. Overlapping BMS-prox LCx in 2009 b.  NSTEMI 2012 due to prox LCx ISR s/p PTCA alone; EF 45% w/ basal inf HK. Had run out of Plavix 1 week prior. c. NSTEMI 05/2015 after running out of Plavix - cath with 3V CAD, Patient left AMA prior to agreeing to CABG. // d. LHC 8/16: LM 60; LAD mid 60, D1 75; RI 50, 80; LCx prox stent 100; RCA prox 100; EF 45-50 >> seen by [facility] - declined CABG >> Med Rx.  . Chronic combined systolic and diastolic CHF (congestive heart failure) ([place]) 06/06/2015   a - Echo 8/16:  EF 40-45, inf-lat AK, Gr 2 diastolic dysfunction, mild AI, mod MR // b - TEE 8/16: EF 45-50, inf HK, mod MR, mild TR // c - Echo 4/18: EF 40-45, ant-lat and inf-lat HK, Gr 2 DD, mild AI, mild to mod MR, severe LAE, PASP 29 // d - Echo 4/19: Mild concentric LVH, EF 40-45, anterolateral/inferolateral HK, mild AI, moderate MR, mild LAE, mild TR, PASP 25   . Clotting disorder ([place])   . Diabetes mellitus without complication ([place])   . HLD (hyperlipidemia)   . Hyperglycemia A1C 6.0 05/2015.  [name] Hypertension   . MI (myocardial infarction) ([place]) x 7  . Poor compliance   . Seizure disorder ([place])   . Seizures ([place])   . TOBACCO ABUSE 02/22/2009  . Valvular heart disease  a. Echo 05/2015: mild AI, mod MR.   Past Surgical History:  Procedure Laterality Date  . CARDIAC CATHETERIZATION [name] 05/29/2015   Procedure: Left Heart Cath and Coronary Angiography;  Surgeon: Belva Crome, MD;  Location: [facility];  Service: Cardiovascular;  Laterality: [name];  . COLONOSCOPY    . CORONARY ANGIOPLASTY  03/2011   PTCA-prox LCx ISR  . CORONARY ANGIOPLASTY WITH STENT PLACEMENT  10/2007   BMS-prox LCx  . LEFT HEART CATHETERIZATION WITH CORONARY ANGIOGRAM [name] 10/13/2013   Procedure: LEFT HEART CATHETERIZATION WITH CORONARY ANGIOGRAM;  Surgeon: Blane Ohara, MD;  [facility]: [facility] CATH LAB;  Service: Cardiovascular;  Laterality: [name];  . TEE WITHOUT CARDIOVERSION [name] 06/16/2015   Procedure: TRANSESOPHAGEAL ECHOCARDIOGRAM (TEE);  Surgeon: Josue Hector, MD;  Location:  [facility] ENDOSCOPY;  Service: Cardiovascular;  Laterality: [name];   Family History  Problem Relation Age of Onset  . Coronary artery disease Maternal Aunt   . Heart disease Maternal Uncle   . Stroke Maternal Uncle   . Heart attack Maternal Uncle        X3  . Hypertension Unknown   . Colon cancer Neg Hx   . Stomach cancer Neg Hx   . Rectal cancer Neg Hx   . Pancreatic cancer Neg Hx    Social History   Socioeconomic History  . Marital status: Single    Spouse name: Not on file  . Number of children: 0  . Years of education: 12th  . Highest education level: Not on file  Occupational History  . Occupation: former Dealer  Social Needs  . Financial resource strain: Not hard at all  . Food insecurity:    Worry: Never true    Inability: Never true  . Transportation needs:    Medical: No    Non-medical: No  Tobacco Use  . Smoking status: Current Some Day Smoker    Years: 35.00    Types: Cigars  . Smokeless tobacco: Never Used  . Tobacco comment: smoke cigars 2 per day  Substance and Sexual Activity  . Alcohol use: No  . Drug use: No  . Sexual activity: Not Currently  Lifestyle  . Physical activity:    Days per week: 0 days    Minutes per session: 0 min  . Stress: To some extent  Relationships  . Social connections:    Talks on phone: More than three times a week    Gets together: More than three times a week    Attends religious service: More than 4 times per year    Active member of club or organization: Yes    Attends meetings of clubs or organizations: More than 4 times per year    Relationship status: Not on file  Other Topics Concern  . Not on file  Social History Narrative   Fun/Hobby: Likes the lake or ocean.     Outpatient Encounter Medications as of 07/09/2018  Medication Sig  . AMBULATORY NON FORMULARY MEDICATION Medication Name: Black seed oil  . atorvastatin (LIPITOR) 20 MG tablet Take 1 tablet (20 mg total) by mouth daily.  . carvedilol (COREG) 3.125 MG  tablet Take 1 tablet (3.125 mg total) by mouth 2 (two) times daily.  . clopidogrel (PLAVIX) 75 MG tablet TAKE 1 TABLET BY MOUTH EVERY DAY  . ferrous sulfate 325 (65 FE) MG tablet Take 1 tablet (325 mg total) by mouth daily with breakfast.  . Flaxseed, Linseed, (FLAX PO) Take by mouth.  . furosemide (LASIX)  40 MG tablet Take 1 tablet (40 mg total) by mouth daily as needed for edema. Take for weight gain (3 lbs or more in 1 day) or leg swelling  . isosorbide mononitrate (IMDUR) 30 MG 24 hr tablet Take 0.5 tablets (15 mg total) by mouth daily.  [name] levETIRAcetam (KEPPRA) 500 MG tablet Take 1 tablet (500 mg total) by mouth 2 (two) times daily.  [name] lisinopril (PRINIVIL,ZESTRIL) 2.5 MG tablet TAKE 1 TABLET BY MOUTH ONCE DAILY  . nitroGLYCERIN (NITROSTAT) 0.4 MG SL tablet place 1 tablet under the tongue if needed every 5 minutes for chest pain for 3 doses  . OVER THE COUNTER MEDICATION Take 1 drop by mouth daily. BLACKSEED OIL 1 DROP BY MOUTH ONCE DAILY  . PHENobarbital (LUMINAL) 32.4 MG tablet Take 1 tablet (32.4 mg total) by mouth 4 (four) times daily.   No facility-administered encounter medications on file as of 07/09/2018.     Activities of Daily Living In your present state of health, do you have any difficulty performing the following activities: 07/09/2018  Hearing? N  Vision? N  Difficulty concentrating or making decisions? N  Walking or climbing stairs? N  Dressing or bathing? N  Doing errands, shopping? N  Preparing Food and eating ? N  Using the Toilet? N  In the past six months, have you accidently leaked urine? N  Do you have problems with loss of bowel control? N  Managing your Medications? N  Managing your Finances? N  Housekeeping or managing your Housekeeping? N  Some recent data might be hidden    Patient Care Team: Lance Sell, NP as PCP - General (Nurse Practitioner) Sherren Mocha, MD as PCP - Cardiology (Cardiology)   Assessment:   This is a routine wellness  examination for Zedric. Physical assessment deferred to PCP.   Exercise Activities and Dietary recommendations Current Exercise Habits: The patient does not participate in regular exercise at present, Exercise limited by: None identified  Diet (meal preparation, eat out, water intake, caffeinated beverages, dairy products, fruits and vegetables): in general, a "healthy" diet     Reviewed heart healthy diet. Encouraged patient to increase daily water and healthy fluid intake.  Relevant patient education assigned to patient using Emmi.  Goals    . Patient Stated     Go to the beach, meditate with God, and enjoy the calmness. Start to eat healthier by watching sodium and fat in my diet. I am going to check into Silver Sneakers.     . Worship God and reach as many people who need help as I can       Fall Risk Fall Risk  07/09/2018 06/25/2017 12/08/2013  Falls in the past year? No No No    Depression Screen PHQ 2/9 Scores 07/09/2018 06/25/2017 12/08/2013  PHQ - 2 Score 1 0 0  PHQ- 9 Score 1 0 -    Cognitive Function       Ad8 score reviewed for issues:  Issues making decisions: no  Less interest in hobbies / activities: no  Repeats questions, stories (family complaining): no  Trouble using ordinary gadgets (microwave, computer, phone):no  Forgets the month or year: no  Mismanaging finances: no  Remembering appts: no  Daily problems with thinking and/or memory: no Ad8 score is= 0  There is no immunization history on file for this patient.  Screening Tests Health Maintenance  Topic Date Due  . TETANUS/TDAP  03/18/1981  . HIV Screening  04/30/2019 (Originally 03/18/1976)  .  INFLUENZA VACCINE  07/11/2019 (Originally 05/21/2018)  . COLONOSCOPY  09/08/2023  . Hepatitis C Screening  Completed        Plan:    Referral to [facility] to assist and educate patient regarding CHF.  Continue doing brain stimulating activities (puzzles, reading, adult coloring books, staying active) to  keep memory sharp.   Continue to eat heart healthy diet (full of fruits, vegetables, whole grains, lean protein, water--limit salt, fat, and sugar intake) and increase physical activity as tolerated.  I have personally reviewed and noted the following in the patient's chart:   . Medical and social history . Use of alcohol, tobacco or illicit drugs  . Current medications and supplements . Functional ability and status . Nutritional status . Physical activity . Advanced directives . List of other physicians . Vitals . Screenings to include cognitive, depression, and falls . Referrals and appointments  In addition, I have reviewed and discussed with patient certain preventive protocols, quality metrics, and best practice recommendations. A written personalized care plan for preventive services as well as general preventive health recommendations were provided to patient.     Michiel Cowboy, RN  07/09/2018

## 2018-07-09 ENCOUNTER — Ambulatory Visit (INDEPENDENT_AMBULATORY_CARE_PROVIDER_SITE_OTHER): Payer: Medicare Other | Admitting: *Deleted

## 2018-07-09 ENCOUNTER — Telehealth: Payer: Self-pay | Admitting: *Deleted

## 2018-07-09 VITALS — BP 124/58 | HR 78 | Resp 17 | Ht 74.0 in | Wt 223.0 lb

## 2018-07-09 DIAGNOSIS — Z0000 Encounter for general adult medical examination without abnormal findings: Secondary | ICD-10-CM | POA: Diagnosis not present

## 2018-07-09 DIAGNOSIS — Z114 Encounter for screening for human immunodeficiency virus [HIV]: Secondary | ICD-10-CM

## 2018-07-09 NOTE — Progress Notes (Signed)
Medical screening examination/treatment/procedure(s) were performed by the Wellness Coach, RN. As primary care provider I was immediately available for consulation/collaboration. I agree with above documentation. Cynithia Hakimi, NP  

## 2018-07-09 NOTE — Patient Instructions (Addendum)
Continue doing brain stimulating activities (puzzles, reading, adult coloring books, staying active) to keep memory sharp.   Continue to eat heart healthy diet (full of fruits, vegetables, whole grains, lean protein, water--limit salt, fat, and sugar intake) and increase physical activity as tolerated.  [name] 4.2 50 [employer] reviews Youth organization in [place], [place] established in [phone] with recreational programs & services for all ages. Address: [address] [place], [place] [address] Hours:  Open ? Closes 9PM Phone: [phone]  [name] , Thank you for taking time to come for your Medicare Wellness Visit. I appreciate your ongoing commitment to your health goals. Please review the following plan we discussed and let me know if I can assist you in the future.   These are the goals we discussed: Goals    . Patient Stated     Go to the beach, meditate with God, and enjoy the calmness. Start to eat healthier by watching sodium and fat in my diet. I am going to check into Silver [employer]neakers.     . Worship God and reach as many people who need help as I can       This is a list of the screening recommended for you and due dates:  Health Maintenance  Topic Date Due  . Tetanus Vaccine  03/18/1981  . HIV Screening  04/30/2019*  . Flu Shot  07/11/2019*  . Colon Cancer Screening  09/08/2023  .  Hepatitis C: One time screening is recommended by Center for Disease Control  (CDC) for  adults born from [birth year] through 1965.   Completed  *Topic was postponed. The date shown is not the original due date.     Health Maintenance, Male A healthy lifestyle and preventive care is important for your health and wellness. Ask your health care provider about what schedule of regular examinations is right for you. What should I know about weight and diet? Eat a Healthy Diet  Eat plenty of vegetables, fruits, whole grains, low-fat dairy  products, and lean protein.  Do not eat a lot of foods high in solid fats, added sugars, or salt.  Maintain a Healthy Weight Regular exercise can help you achieve or maintain a healthy weight. You should:  Do at least 150 minutes of exercise each week. The exercise should increase your heart rate and make you sweat (moderate-intensity exercise).  Do strength-training exercises at least twice a week.  Watch Your Levels of Cholesterol and Blood Lipids  Have your blood tested for lipids and cholesterol every 5 years starting at 56 years of age. If you are at high risk for heart disease, you should start having your blood tested when you are 56 years old. You may need to have your cholesterol levels checked more often if: ? Your lipid or cholesterol levels are high. ? You are older than 56 years of age. ? You are at high risk for heart disease.  What should I know about cancer screening? Many types of cancers can be detected early and may often be prevented. Lung Cancer  You should be screened every year for lung cancer if: ? You are a current smoker who has smoked for at least 30 years. ? You are a former smoker who has quit within the past 15 years.  Talk to your health care provider about your screening options, when you should start screening, and how often you should be screened.  Colorectal Cancer  Routine colorectal cancer screening  usually begins at 56 years of age and should be repeated every 5-10 years until you are 56 years old. You may need to be screened more often if early forms of precancerous polyps or small growths are found. Your health care provider may recommend screening at an earlier age if you have risk factors for colon cancer.  Your health care provider may recommend using home test kits to check for hidden blood in the stool.  A small camera at the end of a tube can be used to examine your colon (sigmoidoscopy or colonoscopy). This checks for the earliest forms  of colorectal cancer.  Prostate and Testicular Cancer  Depending on your age and overall health, your health care provider may do certain tests to screen for prostate and testicular cancer.  Talk to your health care provider about any symptoms or concerns you have about testicular or prostate cancer.  Skin Cancer  Check your skin from head to toe regularly.  Tell your health care provider about any new moles or changes in moles, especially if: ? There is a change in a mole's size, shape, or color. ? You have a mole that is larger than a pencil eraser.  Always use sunscreen. Apply sunscreen liberally and repeat throughout the day.  Protect yourself by wearing long sleeves, pants, a wide-brimmed hat, and sunglasses when outside.  What should I know about heart disease, diabetes, and high blood pressure?  If you are 70-16 years of age, have your blood pressure checked every 3-5 years. If you are 31 years of age or older, have your blood pressure checked every year. You should have your blood pressure measured twice-once when you are at a hospital or clinic, and once when you are not at a hospital or clinic. Record the average of the two measurements. To check your blood pressure when you are not at a hospital or clinic, you can use: ? An automated blood pressure machine at a pharmacy. ? A home blood pressure monitor.  Talk to your health care provider about your target blood pressure.  If you are between 28-3 years old, ask your health care provider if you should take aspirin to prevent heart disease.  Have regular diabetes screenings by checking your fasting blood sugar level. ? If you are at a normal weight and have a low risk for diabetes, have this test once every three years after the age of 21. ? If you are overweight and have a high risk for diabetes, consider being tested at a younger age or more often.  A one-time screening for abdominal aortic aneurysm (AAA) by ultrasound is  recommended for men aged 78-75 years who are current or former smokers. What should I know about preventing infection? Hepatitis B If you have a higher risk for hepatitis B, you should be screened for this virus. Talk with your health care provider to find out if you are at risk for hepatitis B infection. Hepatitis C Blood testing is recommended for:  Everyone born from [birth year] through 1965.  Anyone with known risk factors for hepatitis C.  Sexually Transmitted Diseases (STDs)  You should be screened each year for STDs including gonorrhea and chlamydia if: ? You are sexually active and are younger than 56 years of age. ? You are older than 56 years of age and your health care provider tells you that you are at risk for this type of infection. ? Your sexual activity has changed since you were last screened and  you are at an increased risk for chlamydia or gonorrhea. Ask your health care provider if you are at risk.  Talk with your health care provider about whether you are at high risk of being infected with HIV. Your health care provider may recommend a prescription medicine to help prevent HIV infection.  What else can I do?  Schedule regular health, dental, and eye exams.  Stay current with your vaccines (immunizations).  Do not use any tobacco products, such as cigarettes, chewing tobacco, and e-cigarettes. If you need help quitting, ask your health care provider.  Limit alcohol intake to no more than 2 drinks per day. One drink equals 12 ounces of beer, 5 ounces of Ashely Joshua, or 1 ounces of hard liquor.  Do not use street drugs.  Do not share needles.  Ask your health care provider for help if you need support or information about quitting drugs.  Tell your health care provider if you often feel depressed.  Tell your health care provider if you have ever been abused or do not feel safe at home. This information is not intended to replace advice given to you by your health care  provider. Make sure you discuss any questions you have with your health care provider. Document Released: 04/04/2008 Document Revised: 06/05/2016 Document Reviewed: 07/11/2015 Elsevier Interactive Patient Education  2018 [place] Maintenance, Male A healthy lifestyle and preventive care is important for your health and wellness. Ask your health care provider about what schedule of regular examinations is right for you. What should I know about weight and diet? Eat a Healthy Diet  Eat plenty of vegetables, fruits, whole grains, low-fat dairy products, and lean protein.  Do not eat a lot of foods high in solid fats, added sugars, or salt.  Maintain a Healthy Weight Regular exercise can help you achieve or maintain a healthy weight. You should:  Do at least 150 minutes of exercise each week. The exercise should increase your heart rate and make you sweat (moderate-intensity exercise).  Do strength-training exercises at least twice a week.  Watch Your Levels of Cholesterol and Blood Lipids  Have your blood tested for lipids and cholesterol every 5 years starting at 56 years of age. If you are at high risk for heart disease, you should start having your blood tested when you are 55 years old. You may need to have your cholesterol levels checked more often if: ? Your lipid or cholesterol levels are high. ? You are older than 56 years of age. ? You are at high risk for heart disease.  What should I know about cancer screening? Many types of cancers can be detected early and may often be prevented. Lung Cancer  You should be screened every year for lung cancer if: ? You are a current smoker who has smoked for at least 30 years. ? You are a former smoker who has quit within the past 15 years.  Talk to your health care provider about your screening options, when you should start screening, and how often you should be screened.  Colorectal Cancer  Routine colorectal cancer  screening usually begins at 56 years of age and should be repeated every 5-10 years until you are 56 years old. You may need to be screened more often if early forms of precancerous polyps or small growths are found. Your health care provider may recommend screening at an earlier age if you have risk factors for colon cancer.  Your health care  provider may recommend using home test kits to check for hidden blood in the stool.  A small camera at the end of a tube can be used to examine your colon (sigmoidoscopy or colonoscopy). This checks for the earliest forms of colorectal cancer.  Prostate and Testicular Cancer  Depending on your age and overall health, your health care provider may do certain tests to screen for prostate and testicular cancer.  Talk to your health care provider about any symptoms or concerns you have about testicular or prostate cancer.  Skin Cancer  Check your skin from head to toe regularly.  Tell your health care provider about any new moles or changes in moles, especially if: ? There is a change in a mole's size, shape, or color. ? You have a mole that is larger than a pencil eraser.  Always use sunscreen. Apply sunscreen liberally and repeat throughout the day.  Protect yourself by wearing long sleeves, pants, a wide-brimmed hat, and sunglasses when outside.  What should I know about heart disease, diabetes, and high blood pressure?  If you are 18-26 years of age, have your blood pressure checked every 3-5 years. If you are 90 years of age or older, have your blood pressure checked every year. You should have your blood pressure measured twice-once when you are at a hospital or clinic, and once when you are not at a hospital or clinic. Record the average of the two measurements. To check your blood pressure when you are not at a hospital or clinic, you can use: ? An automated blood pressure machine at a pharmacy. ? A home blood pressure monitor.  Talk to your  health care provider about your target blood pressure.  If you are between 45-78 years old, ask your health care provider if you should take aspirin to prevent heart disease.  Have regular diabetes screenings by checking your fasting blood sugar level. ? If you are at a normal weight and have a low risk for diabetes, have this test once every three years after the age of 69. ? If you are overweight and have a high risk for diabetes, consider being tested at a younger age or more often.  A one-time screening for abdominal aortic aneurysm (AAA) by ultrasound is recommended for men aged 11-75 years who are current or former smokers. What should I know about preventing infection? Hepatitis B If you have a higher risk for hepatitis B, you should be screened for this virus. Talk with your health care provider to find out if you are at risk for hepatitis B infection. Hepatitis C Blood testing is recommended for:  Everyone born from [birth year] through 1964.  Anyone with known risk factors for hepatitis C.  Sexually Transmitted Diseases (STDs)  You should be screened each year for STDs including gonorrhea and chlamydia if: ? You are sexually active and are younger than 56 years of age. ? You are older than 56 years of age and your health care provider tells you that you are at risk for this type of infection. ? Your sexual activity has changed since you were last screened and you are at an increased risk for chlamydia or gonorrhea. Ask your health care provider if you are at risk.  Talk with your health care provider about whether you are at high risk of being infected with HIV. Your health care provider may recommend a prescription medicine to help prevent HIV infection.  What else can I do?  Schedule regular health,  dental, and eye exams.  Stay current with your vaccines (immunizations).  Do not use any tobacco products, such as cigarettes, chewing tobacco, and e-cigarettes. If you need help  quitting, ask your health care provider.  Limit alcohol intake to no more than 2 drinks per day. One drink equals 12 ounces of beer, 5 ounces of Aubre Quincy, or 1 ounces of hard liquor.  Do not use street drugs.  Do not share needles.  Ask your health care provider for help if you need support or information about quitting drugs.  Tell your health care provider if you often feel depressed.  Tell your health care provider if you have ever been abused or do not feel safe at home. This information is not intended to replace advice given to you by your health care provider. Make sure you discuss any questions you have with your health care provider. Document Released: 04/04/2008 Document Revised: 06/05/2016 Document Reviewed: 07/11/2015 Elsevier Interactive Patient Education  [employer].

## 2018-07-09 NOTE — Telephone Encounter (Signed)
During AWV, patient requested that PCP call him in regards to his Vit B-12 levels. Stating he would like to know what is recommended as he does not want to take B-12 injections.

## 2018-07-10 ENCOUNTER — Other Ambulatory Visit: Payer: Self-pay

## 2018-07-10 NOTE — Telephone Encounter (Signed)
My recommendation would still be injections- see 7/10 lab note, sometimes our body does not absorb the oral forms of b12. However, if he is refusing the injections, we can try four weeks of an oral supplement and then recheck his b12 level on week 5, if he decides to do this, he can purchase b12 OTC 1000 mcg daily

## 2018-07-10 NOTE — Telephone Encounter (Signed)
Pt informed of below. He states he is not happy with the lack of communication between [place] regarding his labs,  tests and medication  recommendations. He also states he feels PCP doesn't seemed concerned enough to listen or communicate with him effectively.   He also is requesting HIV testing be added so that can be checked at same time as B12 levels.

## 2018-07-10 NOTE — Patient Outreach (Signed)
[facility]) Care Management  07/10/2018  [name] 11-09-1960 [id number]   Telephone Screen  Referral Date: 07/09/18 Referral Source: MD office Referral Reason: " chronic severe HF" Insurance: [facility] Medicare   Outreach attempt #1 to patient. No answer. RN CM left HIPAA compliant voicemail message along with contact info.      Plan: RN CM will make outreach attempt to patient within 3-4 business days. RN CM will send unsuccessful outreach letter to patient.   Enzo Montgomery, RN,BSN,CCM [facility] Telephonic Care Management Coordinator Direct Phone: [phone] Toll Free: [phone] Fax: [phone]

## 2018-07-14 ENCOUNTER — Other Ambulatory Visit: Payer: Self-pay | Admitting: Physician Assistant

## 2018-07-14 ENCOUNTER — Other Ambulatory Visit: Payer: Self-pay

## 2018-07-14 NOTE — Patient Outreach (Signed)
[facility]) Care Management  07/14/2018  [name] [birth year] [id number]   Telephone Screen  Referral Date: 07/09/18 Referral Source: MD office Referral Reason: " chronic severe HF" Insurance: [facility] Medicare   Outreach attempt #2 to patient. No answer at present. RN CM left HIPAA complaint voicemail message along with contact info.       Plan: RN CM will make outreach attempt to patient within 3-4 business days.   Enzo Montgomery, RN,BSN,CCM [facility] Telephonic Care Management Coordinator Direct Phone: [phone] Toll Free: [phone] Fax: [phone]

## 2018-07-15 ENCOUNTER — Other Ambulatory Visit: Payer: Self-pay

## 2018-07-15 NOTE — Patient Outreach (Signed)
[facility]) Care Management  07/15/2018  GAREK SCHUNEMAN [birth year] [id number]   Telephone Screen  Referral Date:07/09/18 Referral Source:MD office Referral Reason:" chronic severe HF" Insurance:[employer] Medicare     Voicemail message received from patient. Return call placed to patient. No answer at present. RN CM left HIPAA compliant voicemail message along with contact info.      Plan: RN CM will close case if no return call or response from letter mailed to patient.   Enzo Montgomery, RN,BSN,CCM [facility] Telephonic Care Management Coordinator Direct Phone: [phone] Toll Free: [phone] Fax: [phone]

## 2018-07-17 ENCOUNTER — Ambulatory Visit: Payer: Self-pay

## 2018-07-20 NOTE — Telephone Encounter (Signed)
HIV test added

## 2018-07-20 NOTE — Addendum Note (Signed)
Addended by: Lance Sell on: 07/20/2018 01:28 PM   Modules accepted: Orders

## 2018-07-20 NOTE — Telephone Encounter (Signed)
Pt informed of below.  

## 2018-07-23 ENCOUNTER — Other Ambulatory Visit: Payer: Self-pay | Admitting: Nurse Practitioner

## 2018-07-23 ENCOUNTER — Other Ambulatory Visit: Payer: Self-pay

## 2018-07-23 MED ORDER — PHENOBARBITAL 32.4 MG PO TABS: 32.4000 mg | ORAL_TABLET | Freq: Four times a day (QID) | ORAL | 5 refills | Status: DC

## 2018-07-23 NOTE — Patient Outreach (Signed)
[facility]) Care Management  07/23/2018  KARIM AIELLO September 13, 1961 [id number]   Telephone Screen  Referral Date:07/09/18 Referral Source:MD office Referral Reason:" chronic severe HF" Insurance:[employer] Medicare   Multiple attempts to establish contact with patient without success. No response from letter mailed to patient. Case is being closed at this time.      Plan: RN CM will close case at this time. RN CM will send MD case closure letter.  Enzo Montgomery, RN,BSN,CCM [facility] Telephonic Care Management Coordinator Direct Phone: [phone] Toll Free: [phone] Fax: [phone]

## 2018-07-23 NOTE — Telephone Encounter (Signed)
Pt requesting refills for PHENobarbital (LUMINAL) 32.4 MG tablet sent to [facility]

## 2018-07-23 NOTE — Telephone Encounter (Signed)
Drug registry checked Last refill for phenobarbital 06-23-2018 # 120. Taking 1 tablet four times daily.

## 2018-08-10 ENCOUNTER — Other Ambulatory Visit: Payer: Self-pay | Admitting: Physician Assistant

## 2018-08-10 DIAGNOSIS — Z9119 Patient's noncompliance with other medical treatment and regimen: Secondary | ICD-10-CM

## 2018-08-10 DIAGNOSIS — I340 Nonrheumatic mitral (valve) insufficiency: Secondary | ICD-10-CM

## 2018-08-10 DIAGNOSIS — I10 Essential (primary) hypertension: Secondary | ICD-10-CM

## 2018-08-10 DIAGNOSIS — I214 Non-ST elevation (NSTEMI) myocardial infarction: Secondary | ICD-10-CM

## 2018-08-10 DIAGNOSIS — Z91199 Patient's noncompliance with other medical treatment and regimen due to unspecified reason: Secondary | ICD-10-CM

## 2018-08-10 DIAGNOSIS — I25118 Atherosclerotic heart disease of native coronary artery with other forms of angina pectoris: Secondary | ICD-10-CM

## 2018-08-14 ENCOUNTER — Other Ambulatory Visit: Payer: Self-pay | Admitting: Cardiovascular Disease

## 2018-08-17 ENCOUNTER — Ambulatory Visit: Payer: Medicare Other | Admitting: Physician Assistant

## 2018-08-17 ENCOUNTER — Other Ambulatory Visit: Payer: Self-pay | Admitting: Physician Assistant

## 2018-08-17 NOTE — Progress Notes (Deleted)
Cardiology Office Note:    Date:  08/17/2018   ID:  [name], DOB 10/22/1960, MRN [id number]  PCP:  Lance Sell, NP  Cardiologist:  Sherren Mocha, MD *** Electrophysiologist:  None   Referring MD: Golden Circle, FNP   No chief complaint on file. ***  History of Present Illness:    [name] is a 57 y.o. male with CAD status post BMS x 2 to the prox CFX in 2009, non-STEMI 2012 due to ISR of CFX stents (off Plavix) treated with angioplasty, ischemic cardiomyopathy, HTN, HL, seizure disorder, reported "clotting disorder," tobacco abuse, noncompliance. Cardiac cathin 09/2013 demonstrated patent stents and nonobstructive disease elsewhere. He was then admitted in 2016 with NSTEMI and LHC demonstrated 3V CAD with total occlusion of the RCA and LCx and mod stenosis of the LM and LAD. He also has mod MR. He was seen by TCTS and he declined CABG.  He was last seen 10/18.  Follow-up echo in 4/19 demonstrated EF 40-45 and moderate mitral regurgitation.  [name] ***  Prior CV studies:   The following studies were reviewed today:  ***Echo 01/29/2018 Mild concentric LVH, EF 40-45, anterolateral and inferolateral HK, mild AI, moderate MR, mild LAE, normal RVSF, mild TR, PASP 25  Echocardiogram 01/31/17 EF 40-45, anterolateral/inferolateral hypokinesis, grade 2 diastolic dysfunction, mild AI, mild to moderate MR, severe LAE, normal RV SF, PASP 29  TEE 8/16 EF 45-50, inf HK, mod MR, mild TR  LHC 8/16 LM 60 LAD mid 60, D1 75 RI 50, 80 LCx prox stent 100 RCA prox 100 EF 45-50  Echo 8/16 EF 40-45, inf-lat AK, Gr 2 diastolic dysfunction, mild AI, mod MR  Past Medical History:  Diagnosis Date  . Anemia 01/01/2017  . CAD (coronary artery disease)    a. Overlapping BMS-prox LCx in 2009 b. NSTEMI 2012 due to prox LCx ISR s/p PTCA alone; EF 45% w/ basal inf HK. Had run out of Plavix 1 week prior. c. NSTEMI 05/2015 after running out of Plavix - cath with 3V CAD,  Patient left AMA prior to agreeing to CABG. // d. LHC 8/16: LM 60; LAD mid 60, D1 75; RI 50, 80; LCx prox stent 100; RCA prox 100; EF 45-50 >> seen by TCTS - declined CABG >> Med Rx.  . Chronic combined systolic and diastolic CHF (congestive heart failure) ([place]) 06/06/2015   a - Echo 8/16:  EF 40-45, inf-lat AK, Gr 2 diastolic dysfunction, mild AI, mod MR // b - TEE 8/16: EF 45-50, inf HK, mod MR, mild TR // c - Echo 4/18: EF 40-45, ant-lat and inf-lat HK, Gr 2 DD, mild AI, mild to mod MR, severe LAE, PASP 29 // d - Echo 4/19: Mild concentric LVH, EF 40-45, anterolateral/inferolateral HK, mild AI, moderate MR, mild LAE, mild TR, PASP 25   . Clotting disorder ([place])   . Diabetes mellitus without complication ([place])   . HLD (hyperlipidemia)   . Hyperglycemia A1C 6.0 05/2015.  [name] Hypertension   . MI (myocardial infarction) ([place]) x 7  . Poor compliance   . Seizure disorder ([place])   . Seizures ([place])   . TOBACCO ABUSE 02/22/2009  . Valvular heart disease    a. Echo 05/2015: mild AI, mod MR.   Surgical Hx: The patient  has a past surgical history that includes Coronary angioplasty with stent (10/2007); Coronary angioplasty (03/2011); left heart catheterization with coronary angiogram ([name], 10/13/2013); Cardiac catheterization ([name], 05/29/2015); TEE without cardioversion ([name], 06/16/2015);  and Colonoscopy.   Current Medications: No outpatient medications have been marked as taking for the 08/17/18 encounter (Appointment) with Richardson Dopp T, PA-C.     Allergies:   Patient has no known allergies.   Social History   Tobacco Use  . Smoking status: Current Some Day Smoker    Years: 35.00    Types: Cigars  . Smokeless tobacco: Never Used  . Tobacco comment: smoke cigars 2 per day  Substance Use Topics  . Alcohol use: No  . Drug use: No     Family Hx: The patient's family history includes Coronary artery disease in his maternal aunt; Heart attack in his maternal uncle; Heart disease in his maternal  uncle; Hypertension in his unknown relative; Stroke in his maternal uncle. There is no history of Colon cancer, Stomach cancer, Rectal cancer, or Pancreatic cancer.  ROS:   Please see the history of present illness.    ROS All other systems reviewed and are negative.   EKGs/Labs/Other Test Reviewed:    EKG:  EKG is *** ordered today.  The ekg ordered today demonstrates ***  Recent Labs: 04/29/2018: ALT 15; BUN 14; Creatinine, Ser 1.17; Hemoglobin 12.0; Platelets 400.0; Potassium 4.0; Sodium 139   Recent Lipid Panel Lab Results  Component Value Date/Time   CHOL 216 (H) 12/04/2016 12:19 PM   TRIG 66 12/04/2016 12:19 PM   HDL 38 (L) 12/04/2016 12:19 PM   CHOLHDL 5.7 (H) 12/04/2016 12:19 PM   CHOLHDL 4.5 04/07/2011 01:51 AM   LDLCALC 165 (H) 12/04/2016 12:19 PM    Physical Exam:    VS:  There were no vitals taken for this visit.    Wt Readings from Last 3 Encounters:  07/09/18 223 lb (101.2 kg)  04/29/18 220 lb (99.8 kg)  02/16/18 218 lb 6.4 oz (99.1 kg)     ***Physical Exam  ASSESSMENT & PLAN:    Chronic combined systolic and diastolic CHF (congestive heart failure) (HCC)  Coronary artery disease involving native coronary artery of native heart without angina pectoris  Essential hypertension  Pure hypercholesterolemia  Nonrheumatic mitral valve regurgitation  Tobacco abuse*** Chronic combined systolic and diastolic CHF (congestive heart failure) (HCC) EF 40-45.  NYHA 2.  Volume is stable.  Continue current dose of carvedilol, lisinopril, isosorbide.  Coronary artery disease involving native coronary artery of native heart without angina pectoris History of three-vessel CAD treated medically.  He denies anginal symptoms.  Continue Clopidogrel, carvedilol, atorvastatin.  Essential hypertension  The patient's blood pressure is controlled on his current regimen.  Continue current therapy.   Pure hypercholesterolemia  Continue atorvastatin.  Arrange follow-up  CMET, lipids.  TOBACCO ABUSE I again recommended cessation. Dispo:  No follow-ups on file.   Medication Adjustments/Labs and Tests Ordered: Current medicines are reviewed at length with the patient today.  Concerns regarding medicines are outlined above.  Tests Ordered: No orders of the defined types were placed in this encounter.  Medication Changes: No orders of the defined types were placed in this encounter.   Signed, Richardson Dopp, PA-C  08/17/2018 1:24 PM    [facility] [place], [place], [place]  [address] Phone: [phone]; Fax: [phone]

## 2018-08-18 ENCOUNTER — Other Ambulatory Visit: Payer: Self-pay | Admitting: *Deleted

## 2018-08-18 ENCOUNTER — Other Ambulatory Visit (INDEPENDENT_AMBULATORY_CARE_PROVIDER_SITE_OTHER): Payer: Medicare Other

## 2018-08-18 DIAGNOSIS — E538 Deficiency of other specified B group vitamins: Secondary | ICD-10-CM

## 2018-08-18 DIAGNOSIS — Z114 Encounter for screening for human immunodeficiency virus [HIV]: Secondary | ICD-10-CM

## 2018-08-18 LAB — VITAMIN B12: Vitamin B-12: 413 pg/mL (ref 211–911)

## 2018-08-19 LAB — HIV ANTIBODY (ROUTINE TESTING W REFLEX): HIV 1&2 Ab, 4th Generation: NONREACTIVE

## 2018-09-09 ENCOUNTER — Encounter: Payer: Self-pay | Admitting: Physician Assistant

## 2018-09-09 ENCOUNTER — Ambulatory Visit (INDEPENDENT_AMBULATORY_CARE_PROVIDER_SITE_OTHER): Payer: Medicare Other | Admitting: Physician Assistant

## 2018-09-09 VITALS — BP 112/72 | HR 78 | Ht 74.0 in | Wt 228.1 lb

## 2018-09-09 DIAGNOSIS — I2510 Atherosclerotic heart disease of native coronary artery without angina pectoris: Secondary | ICD-10-CM | POA: Diagnosis not present

## 2018-09-09 DIAGNOSIS — J069 Acute upper respiratory infection, unspecified: Secondary | ICD-10-CM

## 2018-09-09 DIAGNOSIS — E7800 Pure hypercholesterolemia, unspecified: Secondary | ICD-10-CM

## 2018-09-09 DIAGNOSIS — I340 Nonrheumatic mitral (valve) insufficiency: Secondary | ICD-10-CM | POA: Diagnosis not present

## 2018-09-09 DIAGNOSIS — I10 Essential (primary) hypertension: Secondary | ICD-10-CM | POA: Diagnosis not present

## 2018-09-09 DIAGNOSIS — I5042 Chronic combined systolic (congestive) and diastolic (congestive) heart failure: Secondary | ICD-10-CM | POA: Diagnosis not present

## 2018-09-09 NOTE — Addendum Note (Signed)
Addended by: Jacinta Shoe on: 09/09/2018 04:18 PM   Modules accepted: Orders

## 2018-09-09 NOTE — Progress Notes (Signed)
Cardiology Office Note:    Date:  09/09/2018   ID:  [name], DOB [birth year], MRN [id number]  PCP:  Lance Sell, NP  Cardiologist:  Sherren Mocha, MD  Electrophysiologist:  None   Referring MD: Golden Circle, FNP   Chief Complaint  Patient presents with  . Follow-up    CAD, CHF    History of Present Illness:    [name] is a 57 y.o. male with CAD status post BMS x 2 to the prox CFX in 2009, non-STEMI 2012 due to ISR of CFX stents (off Plavix) treated with angioplasty, ischemic cardiomyopathy, HTN, HL, seizure disorder, reported "clotting disorder," tobacco abuse, noncompliance. Cardiac cathin 09/2013 demonstrated patent stents and nonobstructive disease elsewhere. He was then admitted in 2016 with NSTEMI and LHC demonstrated 3V CAD with total occlusion of the RCA and LCx and mod stenosis of the LM and LAD. He also has mod MR. He was seen by TCTS and he declined CABG.  Follow-up echo in 4/19 demonstrated EF 40-45 and moderate mitral regurgitation.  [name] returns for follow-up.  He has not had any chest discomfort.  He has not had any significant shortness of breath.  He denies orthopnea, PND or lower extremity swelling.  He denies syncope.  He has recently had cough and congestion.  He has not had any high fevers, purulent sputum or hemoptysis.  Prior CV studies:   The following studies were reviewed today:  Echo 01/29/2018 Mild concentric LVH, EF 40-45, anterolateral and inferolateral HK, mild AI, moderate MR, mild LAE, normal RVSF, mild TR, PASP 25  Echocardiogram 01/31/17 EF 40-45, anterolateral/inferolateral hypokinesis, grade 2 diastolic dysfunction, mild AI, mild to moderate MR, severe LAE, normal RV SF, PASP 29  TEE 8/16 EF 45-50, inf HK, mod MR, mild TR  LHC 8/16 LM 60 LAD mid 60, D1 75 RI 50, 80 LCx prox stent 100 RCA prox 100 EF 45-50  Echo 8/16 EF 40-45, inf-lat AK, Gr 2 diastolic dysfunction, mild AI, mod MR   Past Medical  History:  Diagnosis Date  . Anemia 01/01/2017  . CAD (coronary artery disease)    a. Overlapping BMS-prox LCx in 2009 b. NSTEMI 2012 due to prox LCx ISR s/p PTCA alone; EF 45% w/ basal inf HK. Had run out of Plavix 1 week prior. c. NSTEMI 05/2015 after running out of Plavix - cath with 3V CAD, Patient left AMA prior to agreeing to CABG. // d. LHC 8/16: LM 60; LAD mid 60, D1 75; RI 50, 80; LCx prox stent 100; RCA prox 100; EF 45-50 >> seen by TCTS - declined CABG >> Med Rx.  . Chronic combined systolic and diastolic CHF (congestive heart failure) ([place]) 06/06/2015   a - Echo 8/16:  EF 40-45, inf-lat AK, Gr 2 diastolic dysfunction, mild AI, mod MR // b - TEE 8/16: EF 45-50, inf HK, mod MR, mild TR // c - Echo 4/18: EF 40-45, ant-lat and inf-lat HK, Gr 2 DD, mild AI, mild to mod MR, severe LAE, PASP 29 // d - Echo 4/19: Mild concentric LVH, EF 40-45, anterolateral/inferolateral HK, mild AI, moderate MR, mild LAE, mild TR, PASP 25   . Clotting disorder ([place])   . Diabetes mellitus without complication ([place])   . HLD (hyperlipidemia)   . Hyperglycemia A1C 6.0 05/2015.  [name] Hypertension   . MI (myocardial infarction) ([place]) x 7  . Poor compliance   . Seizure disorder ([place])   . Seizures ([place])   .  TOBACCO ABUSE 02/22/2009  . Valvular heart disease    a. Echo 05/2015: mild AI, mod MR.   Surgical Hx: The patient  has a past surgical history that includes Coronary angioplasty with stent (10/2007); Coronary angioplasty (03/2011); left heart catheterization with coronary angiogram ([name], 10/13/2013); Cardiac catheterization ([name], 05/29/2015); TEE without cardioversion ([name], 06/16/2015); and Colonoscopy.   Current Medications: Current Meds  Medication Sig  . AMBULATORY NON FORMULARY MEDICATION Medication Name: Black seed oil  . atorvastatin (LIPITOR) 20 MG tablet TAKE 1 TABLET BY MOUTH ONCE DAILY  . carvedilol (COREG) 3.125 MG tablet TAKE 1 TABLET(3.125 MG) BY MOUTH TWICE DAILY  . clopidogrel (PLAVIX) 75 MG tablet TAKE  1 TABLET BY MOUTH EVERY DAY  . ferrous sulfate 325 (65 FE) MG tablet Take 1 tablet (325 mg total) by mouth daily with breakfast.  . Flaxseed, Linseed, (FLAX PO) Take by mouth.  . furosemide (LASIX) 40 MG tablet Take 1 tablet (40 mg total) by mouth daily as needed for edema. Take for weight gain (3 lbs or more in 1 day) or leg swelling  . isosorbide mononitrate (IMDUR) 30 MG 24 hr tablet TAKE 1/2 TABLET BY MOUTH DAILY  . levETIRAcetam (KEPPRA) 500 MG tablet Take 1 tablet (500 mg total) by mouth 2 (two) times daily.  [name] lisinopril (PRINIVIL,ZESTRIL) 2.5 MG tablet TAKE 1 TABLET BY MOUTH ONCE DAILY  . nitroGLYCERIN (NITROSTAT) 0.4 MG SL tablet place 1 tablet under the tongue if needed every 5 minutes for chest pain for 3 doses  . OVER THE COUNTER MEDICATION Take 1 drop by mouth daily. BLACKSEED OIL 1 DROP BY MOUTH ONCE DAILY  . PHENobarbital (LUMINAL) 32.4 MG tablet Take 1 tablet (32.4 mg total) by mouth 4 (four) times daily.  . vitamin B-12 (CYANOCOBALAMIN) 1000 MCG tablet Take 500 mcg by mouth 2 (two) times daily.     Allergies:   Patient has no known allergies.   Social History   Tobacco Use  . Smoking status: Current Some Day Smoker    Years: 35.00    Types: Cigars  . Smokeless tobacco: Never Used  . Tobacco comment: smoke cigars 2 per day  Substance Use Topics  . Alcohol use: No  . Drug use: No     Family Hx: The patient's family history includes Coronary artery disease in his maternal aunt; Heart attack in his maternal uncle; Heart disease in his maternal uncle; Hypertension in his unknown relative; Stroke in his maternal uncle. There is no history of Colon cancer, Stomach cancer, Rectal cancer, or Pancreatic cancer.  ROS:   Please see the history of present illness.    Review of Systems  Respiratory: Positive for cough and snoring.   Neurological: Positive for headaches.   All other systems reviewed and are negative.   EKGs/Labs/Other Test Reviewed:    EKG:  EKG is  ordered  today.  The ekg ordered today demonstrates normal sinus rhythm, heart rate 78, normal axis, PVCs, nonspecific ST-T wave changes, QTC 424, no significant change when compared to prior tracing  Recent Labs: 04/29/2018: ALT 15; BUN 14; Creatinine, Ser 1.17; Hemoglobin 12.0; Platelets 400.0; Potassium 4.0; Sodium 139   Recent Lipid Panel Lab Results  Component Value Date/Time   CHOL 216 (H) 12/04/2016 12:19 PM   TRIG 66 12/04/2016 12:19 PM   HDL 38 (L) 12/04/2016 12:19 PM   CHOLHDL 5.7 (H) 12/04/2016 12:19 PM   CHOLHDL 4.5 04/07/2011 01:51 AM   LDLCALC 165 (H) 12/04/2016 12:19 PM  Physical Exam:    VS:  BP 112/72   Pulse 78   Ht 6\' 2"  (1.88 m)   Wt 228 lb 1.9 oz (103.5 kg)   SpO2 93%   BMI 29.29 kg/m     Wt Readings from Last 3 Encounters:  09/09/18 228 lb 1.9 oz (103.5 kg)  07/09/18 223 lb (101.2 kg)  04/29/18 220 lb (99.8 kg)     Physical Exam  Constitutional: He is oriented to person, place, and time. He appears well-developed and well-nourished. No distress.  HENT:  Head: Normocephalic and atraumatic.  Eyes: No scleral icterus.  Neck: No JVD present. No thyromegaly present.  Cardiovascular: Normal rate and regular rhythm.  No murmur heard. Pulmonary/Chest: Effort normal. He has no wheezes. He has no rales.  Abdominal: Soft.  Musculoskeletal: He exhibits no edema.  Lymphadenopathy:    He has no cervical adenopathy.  Neurological: He is alert and oriented to person, place, and time.  Skin: Skin is warm and dry.  Psychiatric: He has a normal mood and affect.    ASSESSMENT & PLAN:    Chronic combined systolic and diastolic CHF (congestive heart failure) (HCC) EF 40-45.  NYHA 2.  Volume appears stable.  Continue current dose of carvedilol, isosorbide, lisinopril.  Coronary artery disease involving native coronary artery of native heart without angina pectoris History of three-vessel CAD treated medically.  He has not had any anginal symptoms.  Continue clopidogrel,  carvedilol, isosorbide, atorvastatin.  Essential hypertension The patient's blood pressure is controlled on his current regimen.  Continue current therapy.   Nonrheumatic mitral valve regurgitation Moderate by echocardiogram in April 2019.  Consider repeat echo in 6 to 12 months.  Pure hypercholesterolemia Arrange follow-up lipids and LFTs.  Viral upper respiratory tract infection He is having symptoms of a viral upper respiratory tract infection.  I recommended Tylenol, Robitussin-DM, Flonase.  If his symptoms should worsen or persist longer than 2 weeks, he should follow-up with primary care.   Dispo:  Return in about 6 months (around 03/10/2019) for Routine Follow Up, w/ Dr. Burt Knack, or Richardson Dopp, PA-C.   Medication Adjustments/Labs and Tests Ordered: Current medicines are reviewed at length with the patient today.  Concerns regarding medicines are outlined above.  Tests Ordered: Orders Placed This Encounter  Procedures  . EKG 12-Lead   Medication Changes: No orders of the defined types were placed in this encounter.   Signed, Richardson Dopp, PA-C  09/09/2018 4:10 PM    [facility] [place], [place], [place]  [address] Phone: [phone]; Fax: [phone]

## 2018-09-09 NOTE — Patient Instructions (Addendum)
Medication Instructions:  Your physician recommends that you continue on your current medications as directed. Please refer to the Current Medication list given to you today.  If you need a refill on your cardiac medications before your next appointment, please call your pharmacy.   Lab work: Lab appointment 12/4:  Fasting Lipids, LFTs  You may come in between the hours of 7:30 am -4:30 pm for lab work..  If you have labs (blood work) drawn today and your tests are completely normal, you will receive your results only by: [name] MyChart Message (if you have MyChart) OR . A paper copy in the mail If you have any lab test that is abnormal or we need to change your treatment, we will call you to review the results.  Testing/Procedures: NONE  Follow-Up: At [facility], you and your health needs are our priority.  As part of our continuing mission to provide you with exceptional heart care, we have created designated Provider Care Teams.  These Care Teams include your primary Cardiologist (physician) and Advanced Practice Providers (APPs -  Physician Assistants and Nurse Practitioners) who all work together to provide you with the care you need, when you need it. You will need a follow up appointment in:  6 months.  Please call our office 2 months in advance to schedule this appointment.  You may see Sherren Mocha, MD or one of the following Advanced Practice Providers on your designated Care Team: Richardson Dopp, PA-C   Any Other Special Instructions Will Be Listed Below (If Applicable).

## 2018-09-23 ENCOUNTER — Other Ambulatory Visit: Payer: Medicare Other | Admitting: *Deleted

## 2018-09-23 DIAGNOSIS — I2510 Atherosclerotic heart disease of native coronary artery without angina pectoris: Secondary | ICD-10-CM

## 2018-09-23 DIAGNOSIS — E7800 Pure hypercholesterolemia, unspecified: Secondary | ICD-10-CM | POA: Diagnosis not present

## 2018-09-23 DIAGNOSIS — I340 Nonrheumatic mitral (valve) insufficiency: Secondary | ICD-10-CM

## 2018-09-23 DIAGNOSIS — I10 Essential (primary) hypertension: Secondary | ICD-10-CM

## 2018-09-23 DIAGNOSIS — I5042 Chronic combined systolic (congestive) and diastolic (congestive) heart failure: Secondary | ICD-10-CM | POA: Diagnosis not present

## 2018-09-23 LAB — HEPATIC FUNCTION PANEL
ALK PHOS: 97 IU/L (ref 39–117)
ALT: 15 IU/L (ref 0–44)
AST: 12 IU/L (ref 0–40)
Albumin: 4.4 g/dL (ref 3.5–5.5)
Bilirubin Total: <0.2 mg/dL (ref 0.0–1.2)
Bilirubin, Direct: 0.05 mg/dL (ref 0.00–0.40)
TOTAL PROTEIN: 7.8 g/dL (ref 6.0–8.5)

## 2018-09-23 LAB — LIPID PANEL
Chol/HDL Ratio: 5 ratio (ref 0.0–5.0)
Cholesterol, Total: 176 mg/dL (ref 100–199)
HDL: 35 mg/dL — AB (ref >39)
LDL Calculated: 125 mg/dL — ABNORMAL HIGH (ref 0–99)
Triglycerides: 81 mg/dL (ref 0–149)
VLDL CHOLESTEROL CAL: 16 mg/dL (ref 5–40)

## 2018-10-02 ENCOUNTER — Telehealth: Payer: Self-pay

## 2018-10-02 DIAGNOSIS — I5042 Chronic combined systolic (congestive) and diastolic (congestive) heart failure: Secondary | ICD-10-CM

## 2018-10-02 DIAGNOSIS — I10 Essential (primary) hypertension: Secondary | ICD-10-CM

## 2018-10-02 DIAGNOSIS — I2510 Atherosclerotic heart disease of native coronary artery without angina pectoris: Secondary | ICD-10-CM

## 2018-10-02 DIAGNOSIS — E7800 Pure hypercholesterolemia, unspecified: Secondary | ICD-10-CM

## 2018-10-02 MED ORDER — ROSUVASTATIN CALCIUM 40 MG PO TABS: 40.0000 mg | ORAL_TABLET | Freq: Every day | ORAL | 3 refills | Status: DC

## 2018-10-02 NOTE — Telephone Encounter (Signed)
Reviewed results with pt who verbalized understanding. Per Richardson Dopp, PA-C recommendations to increase Crestor to 40 mg qd and obtain lipids and lfts in 3 months (lab appt 12/31/18). Pt states his pharmacy is [facility] on [address] and would like a 90 day supply. Orders have been placed in Epic.

## 2018-10-30 ENCOUNTER — Encounter: Payer: Self-pay | Admitting: Nurse Practitioner

## 2018-10-30 ENCOUNTER — Ambulatory Visit (INDEPENDENT_AMBULATORY_CARE_PROVIDER_SITE_OTHER): Payer: Medicare Other | Admitting: Nurse Practitioner

## 2018-10-30 ENCOUNTER — Other Ambulatory Visit (INDEPENDENT_AMBULATORY_CARE_PROVIDER_SITE_OTHER): Payer: Medicare Other

## 2018-10-30 VITALS — BP 120/76 | HR 77 | Ht 74.0 in | Wt 225.0 lb

## 2018-10-30 DIAGNOSIS — D509 Iron deficiency anemia, unspecified: Secondary | ICD-10-CM

## 2018-10-30 DIAGNOSIS — H6122 Impacted cerumen, left ear: Secondary | ICD-10-CM | POA: Diagnosis not present

## 2018-10-30 DIAGNOSIS — E538 Deficiency of other specified B group vitamins: Secondary | ICD-10-CM

## 2018-10-30 LAB — CBC
HCT: 32.8 % — ABNORMAL LOW (ref 39.0–52.0)
Hemoglobin: 11.1 g/dL — ABNORMAL LOW (ref 13.0–17.0)
MCHC: 33.9 g/dL (ref 30.0–36.0)
MCV: 86.4 fl (ref 78.0–100.0)
Platelets: 479 10*3/uL — ABNORMAL HIGH (ref 150.0–400.0)
RBC: 3.8 Mil/uL — ABNORMAL LOW (ref 4.22–5.81)
RDW: 16.5 % — ABNORMAL HIGH (ref 11.5–15.5)
WBC: 7.6 10*3/uL (ref 4.0–10.5)

## 2018-10-30 LAB — FERRITIN: Ferritin: 52.6 ng/mL (ref 22.0–322.0)

## 2018-10-30 LAB — VITAMIN B12: Vitamin B-12: 473 pg/mL (ref 211–911)

## 2018-10-30 LAB — FOLATE: FOLATE: 7.2 ng/mL (ref >5.9)

## 2018-10-30 NOTE — Progress Notes (Signed)
Patient consent obtained. Irrigation with water and peroxide performed. Full view of tympanic membranes after procedure.  Patient tolerated procedure well.   

## 2018-10-30 NOTE — Patient Instructions (Addendum)
Head downstairs for lab work  I will see you back around July for annual visit   Anemia  Anemia is a condition in which you do not have enough red blood cells or hemoglobin. Hemoglobin is a substance in red blood cells that carries oxygen. When you do not have enough red blood cells or hemoglobin (are anemic), your body cannot get enough oxygen and your organs may not work properly. As a result, you may feel very tired or have other problems. What are the causes? Common causes of anemia include:  Excessive bleeding. Anemia can be caused by excessive bleeding inside or outside the body, including bleeding from the intestine or from periods in women.  Poor nutrition.  Long-lasting (chronic) kidney, thyroid, and liver disease.  Bone marrow disorders.  Cancer and treatments for cancer.  HIV (human immunodeficiency virus) and AIDS (acquired immunodeficiency syndrome).  Treatments for HIV and AIDS.  Spleen problems.  Blood disorders.  Infections, medicines, and autoimmune disorders that destroy red blood cells. What are the signs or symptoms? Symptoms of this condition include:  Minor weakness.  Dizziness.  Headache.  Feeling heartbeats that are irregular or faster than normal (palpitations).  Shortness of breath, especially with exercise.  Paleness.  Cold sensitivity.  Indigestion.  Nausea.  Difficulty sleeping.  Difficulty concentrating. Symptoms may occur suddenly or develop slowly. If your anemia is mild, you may not have symptoms. How is this diagnosed? This condition is diagnosed based on:  Blood tests.  Your medical history.  A physical exam.  Bone marrow biopsy. Your health care provider may also check your stool (feces) for blood and may do additional testing to look for the cause of your bleeding. You may also have other tests, including:  Imaging tests, such as a CT scan or MRI.  Endoscopy.  Colonoscopy. How is this treated? Treatment for  this condition depends on the cause. If you continue to lose a lot of blood, you may need to be treated at a hospital. Treatment may include:  Taking supplements of iron, vitamin D78, or folic acid.  Taking a hormone medicine (erythropoietin) that can help to stimulate red blood cell growth.  Having a blood transfusion. This may be needed if you lose a lot of blood.  Making changes to your diet.  Having surgery to remove your spleen. Follow these instructions at home:  Take over-the-counter and prescription medicines only as told by your health care provider.  Take supplements only as told by your health care provider.  Follow any diet instructions that you were given.  Keep all follow-up visits as told by your health care provider. This is important. Contact a health care provider if:  You develop new bleeding anywhere in the body. Get help right away if:  You are very weak.  You are short of breath.  You have pain in your abdomen or chest.  You are dizzy or feel faint.  You have trouble concentrating.  You have bloody or black, tarry stools.  You vomit repeatedly or you vomit up blood. Summary  Anemia is a condition in which you do not have enough red blood cells or enough of a substance in your red blood cells that carries oxygen (hemoglobin).  Symptoms may occur suddenly or develop slowly.  If your anemia is mild, you may not have symptoms.  This condition is diagnosed with blood tests as well as a medical history and physical exam. Other tests may be needed.  Treatment for this condition depends  on the cause of the anemia. This information is not intended to replace advice given to you by your health care provider. Make sure you discuss any questions you have with your health care provider. Document Released: 11/14/2004 Document Revised: 11/08/2016 Document Reviewed: 11/08/2016 Elsevier Interactive Patient Education  2019 [employer].

## 2018-10-30 NOTE — Assessment & Plan Note (Signed)
It looks like he was following with GI for this in 2018 but never returned Update labs today Continue iron supplement F/U with further recommendations pending lab results

## 2018-10-30 NOTE — Progress Notes (Signed)
[name] is a 58 y.o. male with the following history as recorded in Epic[facility]:  Patient Active Problem List   Diagnosis Date Noted  . Therapeutic drug monitoring 01/19/2018  . Iron deficiency anemia 01/01/2017  . Essential hypertension 06/06/2015  . Chronic combined systolic and diastolic CHF (congestive heart failure) ([place]) 06/06/2015  . H/O noncompliance with medical treatment, presenting hazards to health 06/06/2015  . Mitral regurgitation 06/06/2015  . Coronary artery disease involving native coronary artery of native heart without angina pectoris   . History of MI (myocardial infarction) 05/27/2015  . Seizure disorder ([place]) 05/27/2015  . Refractory epilepsy ([place]) 01/20/2014  . Ischemic cardiomyopathy 12/01/2013  . Tobacco abuse 02/22/2009  . Pure hypercholesterolemia 02/06/2009  . SEIZURE DISORDER 02/06/2009    Current Outpatient Medications  Medication Sig Dispense Refill  . AMBULATORY NON FORMULARY MEDICATION Medication Name: Black seed oil    . atorvastatin (LIPITOR) 20 MG tablet TAKE 1 TABLET BY MOUTH ONCE DAILY 90 tablet 1  . carvedilol (COREG) 3.125 MG tablet TAKE 1 TABLET(3.125 MG) BY MOUTH TWICE DAILY 180 tablet 1  . clopidogrel (PLAVIX) 75 MG tablet TAKE 1 TABLET BY MOUTH EVERY DAY 90 tablet 2  . ferrous sulfate 325 (65 FE) MG tablet Take 1 tablet (325 mg total) by mouth daily with breakfast. 90 tablet 3  . Flaxseed, Linseed, (FLAX PO) Take by mouth.    . furosemide (LASIX) 40 MG tablet Take 1 tablet (40 mg total) by mouth daily as needed for edema. Take for weight gain (3 lbs or more in 1 day) or leg swelling 90 tablet 3  . isosorbide mononitrate (IMDUR) 30 MG 24 hr tablet TAKE 1/2 TABLET BY MOUTH DAILY 15 tablet 6  . levETIRAcetam (KEPPRA) 500 MG tablet Take 1 tablet (500 mg total) by mouth 2 (two) times daily. 60 tablet 11  . lisinopril (PRINIVIL,ZESTRIL) 2.5 MG tablet TAKE 1 TABLET BY MOUTH ONCE DAILY 90 tablet 2  . nitroGLYCERIN (NITROSTAT) 0.4 MG SL tablet  place 1 tablet under the tongue if needed every 5 minutes for chest pain for 3 doses 25 tablet 3  . OVER THE COUNTER MEDICATION Take 1 drop by mouth daily. BLACKSEED OIL 1 DROP BY MOUTH ONCE DAILY    . PHENobarbital (LUMINAL) 32.4 MG tablet Take 1 tablet (32.4 mg total) by mouth 4 (four) times daily. 120 tablet 5  . rosuvastatin (CRESTOR) 40 MG tablet Take 1 tablet (40 mg total) by mouth daily. 90 tablet 3  . vitamin B-12 (CYANOCOBALAMIN) 1000 MCG tablet Take 500 mcg by mouth 2 (two) times daily.    . Zinc 30 MG TABS Take 1 tablet by mouth daily.     No current facility-administered medications for this visit.     Allergies: Patient has no known allergies.  Past Medical History:  Diagnosis Date  . Anemia 01/01/2017  . CAD (coronary artery disease)    a. Overlapping BMS-prox LCx in 2009 b. NSTEMI 2012 due to prox LCx ISR s/p PTCA alone; EF 45% w/ basal inf HK. Had run out of Plavix 1 week prior. c. NSTEMI 05/2015 after running out of Plavix - cath with 3V CAD, Patient left AMA prior to agreeing to CABG. // d. LHC 8/16: LM 60; LAD mid 60, D1 75; RI 50, 80; LCx prox stent 100; RCA prox 100; EF 45-50 >> seen by TCTS - declined CABG >> Med Rx.  . Chronic combined systolic and diastolic CHF (congestive heart failure) ([place]) 06/06/2015   a -  Echo 8/16:  EF 40-45, inf-lat AK, Gr 2 diastolic dysfunction, mild AI, mod Kevin // b - TEE 8/16: EF 45-50, inf HK, mod Kevin, mild TR // c - Echo 4/18: EF 40-45, ant-lat and inf-lat HK, Gr 2 DD, mild AI, mild to mod Kevin, severe LAE, PASP 29 // d - Echo 4/19: Mild concentric LVH, EF 40-45, anterolateral/inferolateral HK, mild AI, moderate Kevin, mild LAE, mild TR, PASP 25   . Clotting disorder ([place])   . Diabetes mellitus without complication ([place])   . HLD (hyperlipidemia)   . Hyperglycemia A1C 6.0 05/2015.  [name] Hypertension   . MI (myocardial infarction) ([place]) x 7  . Poor compliance   . Seizure disorder ([place])   . Seizures ([place])   . TOBACCO ABUSE 02/22/2009  . Valvular heart  disease    a. Echo 05/2015: mild AI, mod Kevin.    Past Surgical History:  Procedure Laterality Date  . CARDIAC CATHETERIZATION [name] 05/29/2015   Procedure: Left Heart Cath and Coronary Angiography;  Surgeon: Belva Crome, MD;  Location: [facility];  Service: Cardiovascular;  Laterality: [name];  . COLONOSCOPY    . CORONARY ANGIOPLASTY  03/2011   PTCA-prox LCx ISR  . CORONARY ANGIOPLASTY WITH STENT PLACEMENT  10/2007   BMS-prox LCx  . LEFT HEART CATHETERIZATION WITH CORONARY ANGIOGRAM [name] 10/13/2013   Procedure: LEFT HEART CATHETERIZATION WITH CORONARY ANGIOGRAM;  Surgeon: Blane Ohara, MD;  Location: [facility] LAB;  Service: Cardiovascular;  Laterality: [name];  . TEE WITHOUT CARDIOVERSION [name] 06/16/2015   Procedure: TRANSESOPHAGEAL ECHOCARDIOGRAM (TEE);  Surgeon: Josue Hector, MD;  Location: [facility] ENDOSCOPY;  Service: Cardiovascular;  Laterality: [name];    Family History  Problem Relation Age of Onset  . Coronary artery disease Maternal Aunt   . Heart disease Maternal Uncle   . Stroke Maternal Uncle   . Heart attack Maternal Uncle        X3  . Hypertension Unknown   . Colon cancer Neg Hx   . Stomach cancer Neg Hx   . Rectal cancer Neg Hx   . Pancreatic cancer Neg Hx     Social History   Tobacco Use  . Smoking status: Current Some Day Smoker    Years: 35.00    Types: Cigars  . Smokeless tobacco: Never Used  . Tobacco comment: smoke cigars 2 per day  Substance Use Topics  . Alcohol use: No     Subjective:  [name] is here today for routine follow up, he is also requesting evaluation of decreased hearing from left ear. Since our last OV, he has continued regular follow up with cardiology for CHF, CAD, HTN, HLD, mitral valve regurgitation and Neurology for seizure disorder. Currently, all of his daily medications are prescribed by speciality providers. At his last OV with me on 04/29/18, his labs showed continued mild anemia and low b12, he did not return for b12 injections or  ifob testing as requested. He tells me today he has continued taking ferrous sulfate 325 and OTC B12 1000 mcg daily since last OV. He says he is overall feeling well and denies any fatigue, weakness, syncope, dizziness, abd pain, n/v, bowel or bladder changes, abnormal bruising or bleeding, numbness or tingling. Colonoscopy Is up to date He also c/o decreased hearing from left ear for past few weeks, no pain, drainage, injury.  ROS- See HPI  Objective:  Vitals:   10/30/18 1431  BP: 120/76  Pulse: 77  SpO2: 99%  Weight:  225 lb (102.1 kg)  Height: 6\' 2"  (1.88 m)    General: Well developed, well nourished, in no acute distress  Skin : Warm and dry.  Head: Normocephalic and atraumatic  Eyes: Sclera and conjunctiva clear; pupils round and reactive to light; extraocular movements intact  Ears: External normal; canals clear; tympanic membranes normal, LEFT canal impacted with copious hard wax, examination post ear lavage canal is clear and no bleeding or complications noted. Oropharynx: Pink, supple. No suspicious lesions  Neck: Supple Lungs: Respirations unlabored; clear to auscultation bilaterally without wheeze, rales, rhonchi  CVS exam: normal rate, regular rhythm, normal S1, S2, no murmurs, rubs, clicks or gallops.  Abdomen: Soft; nontender; nondistended;  no masses or hepatosplenomegaly  Vessels: Symmetric bilaterally  Neurologic: Alert and oriented; speech intact; face symmetrical; moves all extremities well; CNII-XII intact without focal deficit  Psychiatric: Normal mood and affect.  Assessment:  1. Iron deficiency anemia, unspecified iron deficiency anemia type   2. Vitamin B12 deficiency   3. Impacted cerumen of left ear     Plan:   Vitamin B12 deficiency Update labs today prior to any treatment We discussed that if B12 remains low, injections could be indicated and he seems agreeable He will continue b12 supplement as well F/U with further recommendations pending lab  results  Impacted cerumen of left ear Irrigation/lavage of bilateral ears with removal of impacted cerumen Patient tolerated well F/u for new or worsening symptoms  Return in about 6 months (around 04/30/2019) for CPE.  Orders Placed This Encounter  Procedures  . Vitamin B12    Standing Status:   Future    Number of Occurrences:   1    Standing Expiration Date:   10/31/2019  . Folate    Standing Status:   Future    Number of Occurrences:   1    Standing Expiration Date:   10/31/2019  . Iron and TIBC    Standing Status:   Future    Number of Occurrences:   1    Standing Expiration Date:   10/31/2019  . Ferritin    Standing Status:   Future    Number of Occurrences:   1    Standing Expiration Date:   10/31/2019  . CBC    Standing Status:   Future    Number of Occurrences:   1    Standing Expiration Date:   10/31/2019    Requested Prescriptions    No prescriptions requested or ordered in this encounter

## 2018-10-31 LAB — IRON, TOTAL/TOTAL IRON BINDING CAP
%SAT: 13 % (calc) — ABNORMAL LOW (ref 20–48)
Iron: 43 ug/dL — ABNORMAL LOW (ref 50–180)
TIBC: 331 mcg/dL (calc) (ref 250–425)

## 2018-11-02 ENCOUNTER — Other Ambulatory Visit: Payer: Self-pay | Admitting: Nurse Practitioner

## 2018-11-02 DIAGNOSIS — D649 Anemia, unspecified: Secondary | ICD-10-CM

## 2018-12-22 ENCOUNTER — Telehealth: Payer: Self-pay | Admitting: Physician Assistant

## 2018-12-22 ENCOUNTER — Telehealth: Payer: Self-pay | Admitting: *Deleted

## 2018-12-22 NOTE — Telephone Encounter (Signed)
Follow Up: ° ° °Pt is calling you back. °

## 2018-12-22 NOTE — Telephone Encounter (Signed)
SPOKE WITH PT AND PT VERBALIZED HE GOT MESSAGE AND UNDERSTAND INSTRUCTIONS OF STOPPING ATORVASTATIN 20 MG  AND REMAIN ON ROSUVASTATIN 40 MG.

## 2018-12-22 NOTE — Telephone Encounter (Signed)
LMOVM FOR PT TO STOP TAKING ATORVASTATIN 20 MG  ONCE A DAY  AND CONTINUE TO TAKE ROSUVASTATIN 40 MG ONCE A DAY   PT WAS INSTRUCTED TO CONTACT CLINIC BACK TO CONFIRM HE HAS GOTTEN THE MESSAGE.BEFORE CLOSING AT 5. PT WAS TOLD HE WILL BE CONTACTED IN EARLY AM IF NO RETURN CALL.  THE PHARMACY WAS CONTACTED WHO STATED PT PICKED  UP  90 DAY  ROSUVASTATIN IN 12/19 AND PICKED UP ANOTHER RX IN JAN 20 OF ATORVASTATIN.  PHARMACY HAS DISCONTINUED ATORVASTATIN PER WEAVER RECOMMENDATIONS.

## 2018-12-22 NOTE — Telephone Encounter (Signed)
Please call patient. Lipitor and Crestor are both listed in his chart. Please find out which medication he is taking and remove the one he is NOT taking. Thanks Richardson Dopp, PA-C    12/22/2018 3:03 PM

## 2018-12-28 ENCOUNTER — Telehealth: Payer: Self-pay | Admitting: *Deleted

## 2018-12-28 NOTE — Telephone Encounter (Signed)
Message from A. Shambley read to patient. Pt has multiple questions regarding referrals. States he was told by cardiologist he was not anemic, "MY valve is leaking blood."  Multiple questions [place] could not answer. Pt is evasive historian. Please advise further: [phone]

## 2018-12-28 NOTE — Telephone Encounter (Signed)
Left mess for patient to call back. Per PCP to clarify with patient the need for hematology referral for below: Notes recorded by Lance Sell, NP on 11/02/2018 at 1:43 PM EST B12 and iron are stable- continue your daily B12 and iron supplements. Your blood counts continue to show anemia, I would like for you to see a hematologist and gastroenterologist for further evaluation, referrals have been placed.  CRM created.

## 2018-12-29 NOTE — Telephone Encounter (Signed)
Left detailed message again informing pt that PCP is recommending consultations with hematology and GI for further evaluation of anemia and per his labs his hgb and Hct are low and have been for years. See 01/29/2018 Cardiac echo. Please advise is there anything I should tell patient different?

## 2018-12-30 ENCOUNTER — Other Ambulatory Visit: Payer: Self-pay

## 2018-12-30 ENCOUNTER — Telehealth: Payer: Self-pay | Admitting: Oncology

## 2018-12-30 DIAGNOSIS — D509 Iron deficiency anemia, unspecified: Secondary | ICD-10-CM

## 2018-12-30 NOTE — Telephone Encounter (Signed)
Spoke with patient re 3/12 lab/new patient appointment with GM at 2:30 pm. Patient given appointment date/time/location.

## 2018-12-30 NOTE — Telephone Encounter (Signed)
Why dont we have him follow up with his cardiologist, looks like its time for yearly follow up and they can decide

## 2018-12-30 NOTE — Telephone Encounter (Signed)
Pt informed of below.  

## 2018-12-30 NOTE — Progress Notes (Signed)
[place]  Telephone:([phone] [phone]    ID: [name] DOB: [birth year]  MR#: [id number]  [id number]#:[id number]  Patient Care Team: Kevin Sell, NP as PCP - General (Nurse Practitioner) Kevin Mocha, MD as PCP - Cardiology (Cardiology) [name], Kevin Dad, MD as Consulting Physician (Oncology) OTHER MD:   CHIEF COMPLAINT: Anemia  CURRENT TREATMENT: Observation   HISTORY OF CURRENT ILLNESS: [name] is referred here by [name] for valuation and treatment of [name] anemia.   We have records going back to 10/29/2007 when the patient's hemoglobin was 11.7 with an MCV of 88.8.  White cell count was normal at 8.2 and the platelet count was elevated at 528.  Subsequent hemoglobins have ranged between 10.0 and 13.6 with normal platelet counts, until 11/27/2016, when the hemoglobin was 9.4, MCV 85, and platelets 421,000.  I do not find an obvious reason for this drop at that time, but the patient says he was in the hospital or a hospital..  The counts have since recovered to baseline values with a CBC on October 30, 2018 showing a hemoglobin of 11.1, MCV 86.4, platelets 479,000, and white cell count 7.6.  Other relevant labs obtained recently include a ferritin of 52.6 and a folate of 7.2, and B12 of 473, all on 10/30/2018.  The patient subsequent history as detailed below  INTERVAL HISTORY: [name] was evaluated in the hematology clinic on 12/31/2018.   REVIEW OF SYSTEMS: [name] notes that he used to have 25 seizures per day, but he has not had one in over a year since he began taking black seed oil.  He is also on Keppra and phenobarbital.  He is now able to drive. [name] states that he has had 10 heart attacks. The patient denies unusual headaches, visual changes, nausea, vomiting, stiff neck, dizziness, or gait imbalance. There has been no cough, phlegm production, or pleurisy, no chest pain or pressure, and no change in bowel or bladder  habits. The patient denies fever, rash, diaphoreses bleeding, unexplained fatigue or unexplained weight loss. A detailed review of systems was otherwise entirely negative.   PAST MEDICAL HISTORY: Past Medical History:  Diagnosis Date   Anemia 01/01/2017   CAD (coronary artery disease)    a. Overlapping BMS-prox LCx in 2009 b. NSTEMI 2012 due to prox LCx ISR s/p PTCA alone; EF 45% w/ basal inf HK. Had run out of Plavix 1 week prior. c. NSTEMI 05/2015 after running out of Plavix - cath with 3V CAD, Patient left AMA prior to agreeing to CABG. // d. LHC 8/16: LM 60; LAD mid 60, D1 75; RI 50, 80; LCx prox stent 100; RCA prox 100; EF 45-50 >> seen by T[facility] - declined CABG >> Med Rx.   Chronic combined systolic and diastolic CHF (congestive heart failure) ([place]) 06/06/2015   a - Echo 8/16:  EF 40-45, inf-lat AK, Gr 2 diastolic dysfunction, mild AI, mod MR // b - TEE 8/16: EF 45-50, inf HK, mod MR, mild TR // c - Echo 4/18: EF 40-45, ant-lat and inf-lat HK, Gr 2 DD, mild AI, mild to mod MR, severe LAE, PASP 29 // d - Echo 4/19: Mild concentric LVH, EF 40-45, anterolateral/inferolateral HK, mild AI, moderate MR, mild LAE, mild TR, PASP 25    Clotting disorder (HCC)    Diabetes mellitus without complication (HCC)    HLD (hyperlipidemia)    Hyperglycemia A1C 6.0 05/2015.   Hypertension    MI (myocardial infarction) ([name]) x  7   Poor compliance    Seizure disorder ([name])    Seizures ([name])    TOBACCO ABUSE 02/22/2009   Valvular heart disease    a. Echo 05/2015: mild AI, mod MR.     PAST SURGICAL HISTORY: Past Surgical History:  Procedure Laterality Date   CARDIAC CATHETERIZATION [name] 05/29/2015   Procedure: Left Heart Cath and Coronary Angiography;  Surgeon: Kevin Crome, MD;  Location: [facility];  Service: Cardiovascular;  Laterality: [name];   COLONOSCOPY     CORONARY ANGIOPLASTY  03/2011   PTCA-prox LCx ISR   CORONARY ANGIOPLASTY WITH STENT PLACEMENT  10/2007   BMS-prox LCx    LEFT HEART CATHETERIZATION WITH CORONARY ANGIOGRAM [name] 10/13/2013   Procedure: LEFT HEART CATHETERIZATION WITH CORONARY ANGIOGRAM;  Surgeon: Kevin Ohara, MD;  Location: [facility] CATH LAB;  Service: Cardiovascular;  Laterality: [name];   TEE WITHOUT CARDIOVERSION [name] 06/16/2015   Procedure: TRANSESOPHAGEAL ECHOCARDIOGRAM (TEE);  Surgeon: Kevin Hector, MD;  Location: [facility] ENDOSCOPY;  Service: Cardiovascular;  Laterality: [name];     FAMILY HISTORY: Family History  Problem Relation Age of Onset   Coronary artery disease Maternal Aunt    Heart disease Maternal Uncle    Stroke Maternal Uncle    Heart attack Maternal Uncle        X3   Hypertension Other    Colon cancer Neg Hx    Stomach cancer Neg Hx    Rectal cancer Neg Hx    Pancreatic cancer Neg Hx    [name]'s father died from metastatic cancer "from [name] groin to [name] brain" in 06/2018. Patients' mother is 44 as of 12/2018. The patient has 1 known brother and 1 sister on [name] mother's side; he says he may have other paternal half-brothers, but he is unsure.    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 He is currently living with [name] mother, but he is moving out at the end of this month. He is single and will live by himself.  ADVANCED DIRECTIVES: Not in place.   HEALTH MAINTENANCE: Social History   Tobacco Use   Smoking status: Current Some Day Smoker    Years: 35.00    Types: Cigars   Smokeless tobacco: Never Used   Tobacco comment: smoke cigars 2 per day  Substance Use Topics   Alcohol use: No   Drug use: No    Colonoscopy:   Bone density:   PSA:  No Known Allergies  Current Outpatient Medications  Medication Sig Dispense Refill   AMBULATORY NON FORMULARY MEDICATION Medication Name: Black seed oil     carvedilol (COREG) 3.125 MG tablet TAKE 1 TABLET(3.125 MG) BY MOUTH TWICE DAILY 180 tablet 1   clopidogrel (PLAVIX) 75  MG tablet TAKE 1 TABLET BY MOUTH EVERY DAY 90 tablet 2   ferrous sulfate 325 (65 FE) MG tablet Take 1 tablet (325 mg total) by mouth daily with breakfast. 90 tablet 3   Flaxseed, Linseed, (FLAX PO) Take by mouth.     furosemide (LASIX) 40 MG tablet Take 1 tablet (40 mg total) by mouth daily as needed for edema. Take for weight gain (3 lbs or more in 1 day) or leg swelling 90 tablet 3   isosorbide mononitrate (IMDUR) 30 MG 24 hr tablet TAKE 1/2 TABLET BY MOUTH DAILY 15 tablet 6   levETIRAcetam (KEPPRA) 500 MG tablet Take 1 tablet (500 mg total) by mouth  2 (two) times daily. 60 tablet 11   lisinopril (PRINIVIL,ZESTRIL) 2.5 MG tablet TAKE 1 TABLET BY MOUTH ONCE DAILY 90 tablet 2   nitroGLYCERIN (NITROSTAT) 0.4 MG SL tablet place 1 tablet under the tongue if needed every 5 minutes for chest pain for 3 doses 25 tablet 3   OVER THE COUNTER MEDICATION Take 1 drop by mouth daily. BLACKSEED OIL 1 DROP BY MOUTH ONCE DAILY     PHENobarbital (LUMINAL) 32.4 MG tablet Take 1 tablet (32.4 mg total) by mouth 4 (four) times daily. 120 tablet 5   rosuvastatin (CRESTOR) 40 MG tablet Take 1 tablet (40 mg total) by mouth daily. 90 tablet 3   vitamin B-12 (CYANOCOBALAMIN) 1000 MCG tablet Take 500 mcg by mouth 3 (three) times daily.      Zinc 30 MG TABS Take 2 tablets by mouth daily.      No current facility-administered medications for this visit.      OBJECTIVE: Middle-aged African-American man in no acute distress  Vitals:   12/31/18 1502  BP: 117/69  Pulse: 79  Resp: 18  Temp: 99 F (37.2 C)  SpO2: 100%     Body mass index is 28.89 kg/m.   Wt Readings from Last 3 Encounters:  12/31/18 225 lb (102.1 kg)  12/31/18 225 lb (102.1 kg)  10/30/18 225 lb (102.1 kg)      ECOG FS:0 - Asymptomatic  Ocular: Sclerae unicteric, EOMs intact Ear-nose-throat: Edentulous Lymphatic: No cervical or supraclavicular adenopathy, no axillary adenopathy Lungs no rales or rhonchi Heart regular rate and  rhythm Abd soft, nontender, positive bowel sounds MSK no focal spinal tenderness, no joint edema Neuro: non-focal, well-oriented, appropriate affect    LAB RESULTS:  CMP     Component Value Date/Time   NA 140 12/31/2018 1437   NA 142 01/19/2018 1505   K 4.4 12/31/2018 1437   CL 109 12/31/2018 1437   CO2 22 12/31/2018 1437   GLUCOSE 102 (H) 12/31/2018 1437   BUN 16 12/31/2018 1437   BUN 9 01/19/2018 1505   CREATININE 1.12 12/31/2018 1437   CALCIUM 9.1 12/31/2018 1437   PROT 8.2 (H) 12/31/2018 1437   PROT 7.8 09/23/2018 1233   ALBUMIN 3.9 12/31/2018 1437   ALBUMIN 4.4 09/23/2018 1233   AST 16 12/31/2018 1437   ALT 16 12/31/2018 1437   ALKPHOS 88 12/31/2018 1437   BILITOT 0.3 12/31/2018 1437   BILITOT <0.2 09/23/2018 1233   GFRNONAA >60 12/31/2018 1437   GFRAA >60 12/31/2018 1437    No results found for: TOTALPROTELP, ALBUMINELP, A1GS, A2GS, BETS, BETA2SER, GAMS, MSPIKE, SPEI  No results found for: KPAFRELGTCHN, LAMBDASER, KAPLAMBRATIO  Lab Results  Component Value Date   WBC 6.7 12/31/2018   NEUTROABS 3.0 12/31/2018   HGB 10.8 (L) 12/31/2018   HCT 33.5 (L) 12/31/2018   MCV 88.2 12/31/2018   PLT 347 12/31/2018    @LASTCHEMISTRY @  No results found for: LABCA2  No components found for: UDJSHF026  No results for input(s): INR in the last 168 hours.  No results found for: LABCA2  No results found for: VZC588  No results found for: FOY774  No results found for: JOI786  No results found for: CA2729  No components found for: HGQUANT  No results found for: CEA1 / No results found for: CEA1   No results found for: AFPTUMOR  No results found for: CHROMOGRNA  No results found for: PSA1  Appointment on 12/31/2018  Component Date Value Ref Range Status  Sodium 12/31/2018 140  135 - 145 mmol/L Final   Potassium 12/31/2018 4.4  3.5 - 5.1 mmol/L Final   Chloride 12/31/2018 109  98 - 111 mmol/L Final   CO2 12/31/2018 22  22 - 32 mmol/L Final    Glucose, Bld 12/31/2018 102* 70 - 99 mg/dL Final   BUN 12/31/2018 16  6 - 20 mg/dL Final   Creatinine, Ser 12/31/2018 1.12  0.61 - 1.24 mg/dL Final   Calcium 12/31/2018 9.1  8.9 - 10.3 mg/dL Final   Total Protein 12/31/2018 8.2* 6.5 - 8.1 g/dL Final   Albumin 12/31/2018 3.9  3.5 - 5.0 g/dL Final   AST 12/31/2018 16  15 - 41 U/L Final   ALT 12/31/2018 16  0 - 44 U/L Final   Alkaline Phosphatase 12/31/2018 88  38 - 126 U/L Final   Total Bilirubin 12/31/2018 0.3  0.3 - 1.2 mg/dL Final   GFR calc non Af Amer 12/31/2018 >60  >60 mL/min Final   GFR calc Af Amer 12/31/2018 >60  >60 mL/min Final   Anion gap 12/31/2018 9  5 - 15 Final   Performed at [facility], [place] [address] [place], [place] [address]   Retic Ct Pct 12/31/2018 1.1  0.4 - 3.1 % Final   RBC. 12/31/2018 3.80* 4.22 - 5.81 MIL/uL Final   Retic Count, Absolute 12/31/2018 41.0  19.0 - 186.0 K/uL Final   Immature Retic Fract 12/31/2018 20.1* 2.3 - 15.9 % Final   Performed at [facility], [place] [address] [place], [place] [address]   WBC 12/31/2018 6.7  4.0 - 10.5 K/uL Final   RBC 12/31/2018 3.80* 4.22 - 5.81 MIL/uL Final   Hemoglobin 12/31/2018 10.8* 13.0 - 17.0 g/dL Final   HCT 12/31/2018 33.5* 39.0 - 52.0 % Final   MCV 12/31/2018 88.2  80.0 - 100.0 fL Final   MCH 12/31/2018 28.4  26.0 - 34.0 pg Final   MCHC 12/31/2018 32.2  30.0 - 36.0 g/dL Final   RDW 12/31/2018 17.7* 11.5 - 15.5 % Final   Platelets 12/31/2018 347  150 - 400 K/uL Final   nRBC 12/31/2018 0.0  0.0 - 0.2 % Final   Neutrophils Relative % 12/31/2018 44  % Final   Neutro Abs 12/31/2018 3.0  1.7 - 7.7 K/uL Final   Lymphocytes Relative 12/31/2018 38  % Final   Lymphs Abs 12/31/2018 2.5  0.7 - 4.0 K/uL Final   Monocytes Relative 12/31/2018 11  % Final   Monocytes Absolute 12/31/2018 0.8  0.1 - 1.0 K/uL Final   Eosinophils Relative 12/31/2018 6  % Final   Eosinophils Absolute  12/31/2018 0.4  0.0 - 0.5 K/uL Final   Basophils Relative 12/31/2018 1  % Final   Basophils Absolute 12/31/2018 0.0  0.0 - 0.1 K/uL Final   nRBC 12/31/2018 0  0 /100 WBC Final   Performed at [facility], [place] [address], [place], [place] [address]   Smear Review 12/31/2018 SMEAR STAINED AND AVAILABLE FOR REVIEW   Final   Performed at [facility], [place] [address], [place], [place] [address]    (this displays the last labs from the last 3 days)  No results found for: TOTALPROTELP, ALBUMINELP, A1GS, A2GS, BETS, BETA2SER, GAMS, MSPIKE, SPEI (this displays SPEP labs)  No results found for: KPAFRELGTCHN, LAMBDASER, KAPLAMBRATIO (kappa/lambda light chains)  No results found for: HGBA, HGBA2QUANT, HGBFQUANT, HGBSQUAN (Hemoglobinopathy evaluation)   No results found for:  LDH  Lab Results  Component Value Date   IRON 43 (L) 10/30/2018   TIBC 331 10/30/2018   IRONPCTSAT 13 (L) 10/30/2018   (Iron and TIBC)  Lab Results  Component Value Date   FERRITIN 52.6 10/30/2018    Urinalysis    Component Value Date/Time   COLORURINE YELLOW 12/09/2013 1427   APPEARANCEUR CLEAR 12/09/2013 1427   LABSPEC 1.010 12/09/2013 1427   PHURINE 5.5 12/09/2013 1427   GLUCOSEU NEGATIVE 12/09/2013 1427   GLUCOSEU Negative 08/27/2013 1627   HGBUR TRACE (A) 12/09/2013 1427   BILIRUBINUR NEGATIVE 12/09/2013 1427   KETONESUR 15 (A) 12/09/2013 1427   PROTEINUR NEGATIVE 12/09/2013 1427   UROBILINOGEN 1.0 12/09/2013 1427   NITRITE NEGATIVE 12/09/2013 1427   LEUKOCYTESUR NEGATIVE 12/09/2013 1427     STUDIES:  No results found.   ELIGIBLE FOR AVAILABLE RESEARCH PROTOCOL: NO  REVIEW OF BLOOD FILM: There is mild anisocytosis, with some crenated cells, but no tailed poikilocytes and no schistocytes.  There are no spherocytes.  There is no polychromasia.  There is very minimal rouleaux formation.  In the white cell series there is no left shift.   Platelets are unremarkable   ASSESSMENT: 58 y.o. [place], [place] man with a history of mild anemia dating back at least to 2009, with normal MCV, normal white cell count, and only rarely elevated platelet count; with recent B12 folate and ferritin in the normal range, normal total iron-binding capacity but low saturation of 13%.  Reticulocyte count is inappropriately normal.  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 Specifically we reviewed the biology of the patient's diagnosis and the specifics of [name] situation.  He understands that we all have cells circulating in our blood, and that all those cells are made in the marrow of bones.  There are 3 types of blood cells.  White cells, which are the immune system, platelets, which are clotting cells, and red cells, which carry oxygen.  [name] white cells have been and remain normal in number and appear normal by morphology on review of the peripheral blood film: there is no evidence of leukemia or suggestion of myelodysplasia. The platelet count has been mildly elevated at times, most likely due to a reactive cause such as inflammation or infection.  Platelets are currently in the normal range  With 2 of the 3 cell series normal, a primary bone marrow problem is essentially excluded.  That leaves [place] with [name] longstanding anemia and I gave him data on [name] hemoglobin going back 5 years, with almost all [name] readings above 10.  The one time when he was just below 10 appears to have been when he was hospitalized for cardiac procedures.  In general it is easiest to separate anemias into those due to increased destruction (hemyolysis, bleeding, etc.) and those due to decreased production.  Given Kadarious's normal reticulocyte count, the stability of [name] hemoglobin, and the absence of  spherocytes on smear, there is no evidence of hemolysis, and also taking into account [name] normal iron studies, no evidence of bleeding.  Since he has normal renal function and normal B12 and folate studies most likely we are dealing with the so-called anemia of chronic illness.  What happens in these cases is that iron becomes inappropriately sequestered and marrow response to normal erythropoietin stimulation is  blunted.  It is possible that [name] mild anemia is due also to [name] current seizure medications, which could have a similar effect.  This problem is chronic, has been there for many years, and it is not causing the patient any symptoms.  If he becomes short of breath, develops angina, syncope or palpitations, [name] anemia will not be the cause so long as the hemoglobin remains greater than 10.  Accordingly at this point I do not suggest any further work-up of [name] anemia.  It may be followed through [name] primary care physician at routine visits.  I am not making him a return appointment here but I will be glad to see him at any point in the future as needed.      Voncille Simm, Kevin Dad, MD  12/31/18 3:44 PM Medical Oncology and Hematology [facility] [address] [place], [place] [address] Tel. [phone]    Fax. [phone]   I, Jacqualyn Posey am acting as a Education administrator for Chauncey Cruel, MD.   I, Lurline Del MD, have reviewed the above documentation for accuracy and completeness, and I agree with the above.

## 2018-12-31 ENCOUNTER — Inpatient Hospital Stay: Payer: Medicare Other

## 2018-12-31 ENCOUNTER — Other Ambulatory Visit: Payer: Self-pay

## 2018-12-31 ENCOUNTER — Ambulatory Visit (INDEPENDENT_AMBULATORY_CARE_PROVIDER_SITE_OTHER): Payer: Medicare Other | Admitting: Gastroenterology

## 2018-12-31 ENCOUNTER — Encounter: Payer: Self-pay | Admitting: Gastroenterology

## 2018-12-31 ENCOUNTER — Other Ambulatory Visit: Payer: Medicare Other

## 2018-12-31 ENCOUNTER — Inpatient Hospital Stay: Payer: Medicare Other | Attending: Oncology | Admitting: Oncology

## 2018-12-31 VITALS — BP 117/69 | HR 79 | Temp 99.0°F | Resp 18 | Ht 74.0 in | Wt 225.0 lb

## 2018-12-31 VITALS — BP 110/68 | HR 72 | Temp 97.4°F | Ht 74.0 in | Wt 225.0 lb

## 2018-12-31 DIAGNOSIS — D638 Anemia in other chronic diseases classified elsewhere: Secondary | ICD-10-CM

## 2018-12-31 DIAGNOSIS — F17210 Nicotine dependence, cigarettes, uncomplicated: Secondary | ICD-10-CM | POA: Insufficient documentation

## 2018-12-31 DIAGNOSIS — D508 Other iron deficiency anemias: Secondary | ICD-10-CM

## 2018-12-31 DIAGNOSIS — D649 Anemia, unspecified: Secondary | ICD-10-CM | POA: Diagnosis not present

## 2018-12-31 DIAGNOSIS — I2510 Atherosclerotic heart disease of native coronary artery without angina pectoris: Secondary | ICD-10-CM | POA: Insufficient documentation

## 2018-12-31 DIAGNOSIS — I110 Hypertensive heart disease with heart failure: Secondary | ICD-10-CM | POA: Insufficient documentation

## 2018-12-31 DIAGNOSIS — G40909 Epilepsy, unspecified, not intractable, without status epilepticus: Secondary | ICD-10-CM

## 2018-12-31 DIAGNOSIS — Z79899 Other long term (current) drug therapy: Secondary | ICD-10-CM | POA: Insufficient documentation

## 2018-12-31 DIAGNOSIS — E7800 Pure hypercholesterolemia, unspecified: Secondary | ICD-10-CM

## 2018-12-31 DIAGNOSIS — D509 Iron deficiency anemia, unspecified: Secondary | ICD-10-CM

## 2018-12-31 DIAGNOSIS — E538 Deficiency of other specified B group vitamins: Secondary | ICD-10-CM

## 2018-12-31 DIAGNOSIS — I252 Old myocardial infarction: Secondary | ICD-10-CM | POA: Insufficient documentation

## 2018-12-31 DIAGNOSIS — I10 Essential (primary) hypertension: Secondary | ICD-10-CM | POA: Diagnosis not present

## 2018-12-31 DIAGNOSIS — I5042 Chronic combined systolic (congestive) and diastolic (congestive) heart failure: Secondary | ICD-10-CM | POA: Insufficient documentation

## 2018-12-31 DIAGNOSIS — E785 Hyperlipidemia, unspecified: Secondary | ICD-10-CM | POA: Insufficient documentation

## 2018-12-31 LAB — FOLATE: Folate: 11 ng/mL (ref >5.9)

## 2018-12-31 LAB — COMPREHENSIVE METABOLIC PANEL
ALT: 16 U/L (ref 0–44)
AST: 16 U/L (ref 15–41)
Albumin: 3.9 g/dL (ref 3.5–5.0)
Alkaline Phosphatase: 88 U/L (ref 38–126)
Anion gap: 9 (ref 5–15)
BUN: 16 mg/dL (ref 6–20)
CO2: 22 mmol/L (ref 22–32)
Calcium: 9.1 mg/dL (ref 8.9–10.3)
Chloride: 109 mmol/L (ref 98–111)
Creatinine, Ser: 1.12 mg/dL (ref 0.61–1.24)
GFR calc Af Amer: >60 mL/min (ref >60)
GFR calc non Af Amer: >60 mL/min (ref >60)
Glucose, Bld: 102 mg/dL — ABNORMAL HIGH (ref 70–99)
Potassium: 4.4 mmol/L (ref 3.5–5.1)
Sodium: 140 mmol/L (ref 135–145)
Total Bilirubin: 0.3 mg/dL (ref 0.3–1.2)
Total Protein: 8.2 g/dL — ABNORMAL HIGH (ref 6.5–8.1)

## 2018-12-31 LAB — CBC WITH DIFFERENTIAL/PLATELET
Basophils Absolute: 0 10*3/uL (ref 0.0–0.1)
Basophils Relative: 1 %
Eosinophils Absolute: 0.4 10*3/uL (ref 0.0–0.5)
Eosinophils Relative: 6 %
HCT: 33.5 % — ABNORMAL LOW (ref 39.0–52.0)
Hemoglobin: 10.8 g/dL — ABNORMAL LOW (ref 13.0–17.0)
LYMPHS ABS: 2.5 10*3/uL (ref 0.7–4.0)
Lymphocytes Relative: 38 %
MCH: 28.4 pg (ref 26.0–34.0)
MCHC: 32.2 g/dL (ref 30.0–36.0)
MCV: 88.2 fL (ref 80.0–100.0)
Monocytes Absolute: 0.8 10*3/uL (ref 0.1–1.0)
Monocytes Relative: 11 %
NRBC: 0 /100{WBCs}
Neutro Abs: 3 10*3/uL (ref 1.7–7.7)
Neutrophils Relative %: 44 %
Platelets: 347 10*3/uL (ref 150–400)
RBC: 3.8 MIL/uL — ABNORMAL LOW (ref 4.22–5.81)
RDW: 17.7 % — ABNORMAL HIGH (ref 11.5–15.5)
WBC: 6.7 10*3/uL (ref 4.0–10.5)
nRBC: 0 % (ref 0.0–0.2)

## 2018-12-31 LAB — RETICULOCYTES
Immature Retic Fract: 20.1 % — ABNORMAL HIGH (ref 2.3–15.9)
RBC.: 3.8 MIL/uL — ABNORMAL LOW (ref 4.22–5.81)
Retic Count, Absolute: 41 10*3/uL (ref 19.0–186.0)
Retic Ct Pct: 1.1 % (ref 0.4–3.1)

## 2018-12-31 LAB — SAVE SMEAR(SSMR), FOR PROVIDER SLIDE REVIEW

## 2018-12-31 NOTE — Progress Notes (Signed)
[facility] Progress Note  Chief Complaint: Iron deficiency anemia  Subjective  History:  [name] was referred back to me by primary care for anemia.  I saw him once in April 2018 the request of his cardiology clinic for iron deficiency anemia.  He had previously been seen by Dr. Deatra Ina. At the time of my last evaluation, the patient had a long history of noncompliance, having declined a CABG after discovery of multivessel coronary disease in 2016.  He was a limited historian, was describing chest discomfort, and also declined a rectal exam. At most recent cardiology office visit on 09/09/2018, he was reportedly not having chronic chest pain.  Most recent LV ejection fraction was 40 to 45%, volume stable.   At most recent primary care appointment on 11/02/2018, he was referred to both our practice and hematology for his ongoing anemia.  His appointment with hematology is this afternoon.  He denies rectal bleeding, change in bowel habits, abdominal pain, dysphagia, nausea, vomiting, early satiety or weight loss.  He denies exertional chest pain or dyspnea.  He has a prior seizure disorder, no seizure for years by his report.  Remainder of systems negative except as above  The patient's Past Medical, Family and Social History were reviewed and are on file in the EMR.  Objective:  Med list reviewed  Current Outpatient Medications:    AMBULATORY NON FORMULARY MEDICATION, Medication Name: Black seed oil, Disp: , Rfl:    carvedilol (COREG) 3.125 MG tablet, TAKE 1 TABLET(3.125 MG) BY MOUTH TWICE DAILY, Disp: 180 tablet, Rfl: 1   clopidogrel (PLAVIX) 75 MG tablet, TAKE 1 TABLET BY MOUTH EVERY DAY, Disp: 90 tablet, Rfl: 2   ferrous sulfate 325 (65 FE) MG tablet, Take 1 tablet (325 mg total) by mouth daily with breakfast., Disp: 90 tablet, Rfl: 3   Flaxseed, Linseed, (FLAX PO), Take by mouth., Disp: , Rfl:    furosemide (LASIX) 40 MG tablet, Take 1 tablet (40 mg total) by  mouth daily as needed for edema. Take for weight gain (3 lbs or more in 1 day) or leg swelling, Disp: 90 tablet, Rfl: 3   isosorbide mononitrate (IMDUR) 30 MG 24 hr tablet, TAKE 1/2 TABLET BY MOUTH DAILY, Disp: 15 tablet, Rfl: 6   levETIRAcetam (KEPPRA) 500 MG tablet, Take 1 tablet (500 mg total) by mouth 2 (two) times daily., Disp: 60 tablet, Rfl: 11   lisinopril (PRINIVIL,ZESTRIL) 2.5 MG tablet, TAKE 1 TABLET BY MOUTH ONCE DAILY, Disp: 90 tablet, Rfl: 2   nitroGLYCERIN (NITROSTAT) 0.4 MG SL tablet, place 1 tablet under the tongue if needed every 5 minutes for chest pain for 3 doses, Disp: 25 tablet, Rfl: 3   OVER THE COUNTER MEDICATION, Take 1 drop by mouth daily. BLACKSEED OIL 1 DROP BY MOUTH ONCE DAILY, Disp: , Rfl:    PHENobarbital (LUMINAL) 32.4 MG tablet, Take 1 tablet (32.4 mg total) by mouth 4 (four) times daily., Disp: 120 tablet, Rfl: 5   rosuvastatin (CRESTOR) 40 MG tablet, Take 1 tablet (40 mg total) by mouth daily., Disp: 90 tablet, Rfl: 3   vitamin B-12 (CYANOCOBALAMIN) 1000 MCG tablet, Take 500 mcg by mouth 2 (two) times daily., Disp: , Rfl:    Zinc 30 MG TABS, Take 1 tablet by mouth daily., Disp: , Rfl:    Vital signs in last 24 hrs: Vitals:   12/31/18 1332  BP: 110/68  Pulse: 72  Temp: (!) 97.4 F (36.3 C)    Physical Exam  Alert, conversational and pleasant  HEENT: sclera anicteric, oral mucosa moist without lesions, edentulous  Neck: supple, no thyromegaly, JVD or lymphadenopathy  Cardiac: RRR without murmurs, S1S2 heard, no peripheral edema  Pulm: clear to auscultation bilaterally, normal RR and effort noted  Abdomen: soft, no tenderness, with active bowel sounds. No guarding or palpable hepatosplenomegaly.  Skin; warm and dry, no jaundice or rash  Recent Labs:  CBC Latest Ref Rng & Units 10/30/2018 04/29/2018 01/19/2018  WBC 4.0 - 10.5 K/uL 7.6 6.6 7.0  Hemoglobin 13.0 - 17.0 g/dL 11.1(L) 12.0(L) 10.0(L)  Hematocrit 39.0 - 52.0 % 32.8(L) 35.5(L)  30.2(L)  Platelets 150.0 - 400.0 K/uL 479.0(H) 400.0 462(H)   B12 = 473 on January 10 Iron 43, TIBC 331 (13% saturation), ferritin 53, folate 7 B12 =  413 on October 29 B12 148 July 2019 and 170 Feb 2018   CMP Latest Ref Rng & Units 09/23/2018 04/29/2018 01/19/2018  Glucose 70 - 99 mg/dL - 109(H) 96  BUN 6 - 23 mg/dL - 14 9  Creatinine 0.40 - 1.50 mg/dL - 1.17 0.93  Sodium 135 - 145 mEq/L - 139 142  Potassium 3.5 - 5.1 mEq/L - 4.0 4.7  Chloride 96 - 112 mEq/L - 103 105  CO2 19 - 32 mEq/L - 29 21  Calcium 8.4 - 10.5 mg/dL - 9.4 9.1  Total Protein 6.0 - 8.5 g/dL 7.8 8.1 7.7  Total Bilirubin 0.0 - 1.2 mg/dL <0.2 0.3 <0.2  Alkaline Phos 39 - 117 IU/L 97 79 85  AST 0 - 40 IU/L 12 13 17   ALT 0 - 44 IU/L 15 15 15     @ASSESSMENTPLANBEGIN @ Assessment: Encounter Diagnoses  Name Primary?   Iron deficiency anemia, unspecified iron deficiency anemia type Yes   B12 deficiency     He appears to have mixed anemia of B12 and iron deficiency.  He has no localizing GI symptoms.  It is not clear if there is convincing evidence of a source of occult GI blood loss.  However, this question remains unanswered in a complex patient, therefore I advised him to have an upper endoscopy and a colonoscopy. The procedure were described in detail along with risks and benefits.  He declines to schedule them, stating that he has no care partner who could come with him that day.  I offered to give him information regarding a service that is available for hire as a care partner to come and have his procedures, but he declines.  If this should change, he was encouraged to contact our office.  His disinclination to follow medical advice is discouraging.  He was encouraged to keep his appointment with hematology later today for ongoing management of B12 and iron repletion.  Total time 20 minutes, over half spent face-to-face with patient in counseling and coordination of care.   Nelida Meuse III

## 2018-12-31 NOTE — Patient Instructions (Signed)
If you are age 57 or older, your body mass index should be between 23-30. Your Body mass index is 28.89 kg/m. If this is out of the aforementioned range listed, please consider follow up with your Primary Care Provider.  If you are age 4 or younger, your body mass index should be between 19-25. Your Body mass index is 28.89 kg/m. If this is out of the aformentioned range listed, please consider follow up with your Primary Care Provider.   It has been recommended to you by your physician that you have a(n) EGD/Colon completed. Per your request, we did not schedule the procedure(s) today. Please contact our office at [phone] should you decide to have the procedure completed.  It was a pleasure to see you today!  Dr. Loletha Carrow

## 2019-01-01 ENCOUNTER — Telehealth: Payer: Self-pay | Admitting: *Deleted

## 2019-01-01 DIAGNOSIS — E7800 Pure hypercholesterolemia, unspecified: Secondary | ICD-10-CM

## 2019-01-01 LAB — HEPATIC FUNCTION PANEL
ALBUMIN: 4.5 g/dL (ref 3.8–4.9)
ALT: 18 IU/L (ref 0–44)
AST: 19 IU/L (ref 0–40)
Alkaline Phosphatase: 86 IU/L (ref 39–117)
Bilirubin Total: <0.2 mg/dL (ref 0.0–1.2)
Bilirubin, Direct: 0.07 mg/dL (ref 0.00–0.40)
Total Protein: 7.7 g/dL (ref 6.0–8.5)

## 2019-01-01 LAB — IRON AND TIBC
Iron: 50 ug/dL (ref 42–163)
Saturation Ratios: 15 % — ABNORMAL LOW (ref 20–55)
TIBC: 335 ug/dL (ref 202–409)
UIBC: 285 ug/dL (ref 117–376)

## 2019-01-01 LAB — FOLATE RBC
FOLATE, RBC: 880 ng/mL (ref >498)
Folate, Hemolysate: 285 ng/mL
Hematocrit: 32.4 % — ABNORMAL LOW (ref 37.5–51.0)

## 2019-01-01 LAB — LIPID PANEL
Chol/HDL Ratio: 4.4 ratio (ref 0.0–5.0)
Cholesterol, Total: 135 mg/dL (ref 100–199)
HDL: 31 mg/dL — ABNORMAL LOW (ref >39)
LDL Calculated: 93 mg/dL (ref 0–99)
Triglycerides: 56 mg/dL (ref 0–149)
VLDL Cholesterol Cal: 11 mg/dL (ref 5–40)

## 2019-01-01 LAB — FERRITIN: Ferritin: 69 ng/mL (ref 24–336)

## 2019-01-01 MED ORDER — EZETIMIBE 10 MG PO TABS: 10.0000 mg | ORAL_TABLET | Freq: Every day | ORAL | 3 refills | Status: DC

## 2019-01-01 NOTE — Telephone Encounter (Signed)
-----   Message from Liliane Shi, PA-C sent at 01/01/2019  8:08 AM EDT ----- LDL improved but not at goal.  Goal LDL is < 70.   HDL low.  LFTs normal.  Recommendations:  - Continue Crestor 40 mg once daily  - Start Zetia 10 mg once daily   - Fasting Lipids and LFTs in 3 mos. Richardson Dopp, PA-C    01/01/2019 8:07 AM

## 2019-01-01 NOTE — Telephone Encounter (Signed)
LMOVM TO CONTACT CLINIC BACK FOR RESULTS AND RECCOMENDATIONS

## 2019-01-01 NOTE — Telephone Encounter (Signed)
SPOKE WITH PT AND PT IS AWARE OF RESULTS AND RECOMMENDATIONS   RX SENT INTO PHARMACY 90 DAY SUPPLY   PT WOULD LIKE TO BE CONTACTED BACK Monday MORNING TO SCHEDULE 3 [facility] APPT.

## 2019-01-02 LAB — METHYLMALONIC ACID, SERUM: Methylmalonic Acid, Quantitative: 88 nmol/L (ref 0–378)

## 2019-01-11 ENCOUNTER — Other Ambulatory Visit: Payer: Self-pay | Admitting: Nurse Practitioner

## 2019-01-12 ENCOUNTER — Other Ambulatory Visit: Payer: Self-pay | Admitting: Cardiovascular Disease

## 2019-01-21 ENCOUNTER — Ambulatory Visit: Payer: Medicare Other | Admitting: Nurse Practitioner

## 2019-01-22 ENCOUNTER — Other Ambulatory Visit: Payer: Self-pay | Admitting: Physician Assistant

## 2019-01-24 ENCOUNTER — Other Ambulatory Visit: Payer: Self-pay | Admitting: Cardiovascular Disease

## 2019-01-24 DIAGNOSIS — I340 Nonrheumatic mitral (valve) insufficiency: Secondary | ICD-10-CM

## 2019-01-24 DIAGNOSIS — I214 Non-ST elevation (NSTEMI) myocardial infarction: Secondary | ICD-10-CM

## 2019-01-24 DIAGNOSIS — Z91199 Patient's noncompliance with other medical treatment and regimen due to unspecified reason: Secondary | ICD-10-CM

## 2019-01-24 DIAGNOSIS — I25118 Atherosclerotic heart disease of native coronary artery with other forms of angina pectoris: Secondary | ICD-10-CM

## 2019-01-24 DIAGNOSIS — I10 Essential (primary) hypertension: Secondary | ICD-10-CM

## 2019-01-24 DIAGNOSIS — Z9119 Patient's noncompliance with other medical treatment and regimen: Secondary | ICD-10-CM

## 2019-01-25 ENCOUNTER — Ambulatory Visit: Payer: Medicare Other | Admitting: Neurology

## 2019-01-28 ENCOUNTER — Telehealth: Payer: Self-pay

## 2019-01-28 NOTE — Telephone Encounter (Signed)
I called pt about his visit with Dr. Leonie Man on 02/08/2019.I explain due to COVID 19 we are converting to web ex video visit. I verify email and update medication list. Pt gave consent to do video and file insurance.PT stated he was not at home to write down the web ex on Monday. He requested a call back after 1100am.

## 2019-02-08 ENCOUNTER — Telehealth: Payer: Self-pay

## 2019-02-08 ENCOUNTER — Other Ambulatory Visit: Payer: Self-pay

## 2019-02-08 ENCOUNTER — Ambulatory Visit (INDEPENDENT_AMBULATORY_CARE_PROVIDER_SITE_OTHER): Payer: Medicare Other | Admitting: Neurology

## 2019-02-08 DIAGNOSIS — R569 Unspecified convulsions: Secondary | ICD-10-CM

## 2019-02-08 MED ORDER — LEVETIRACETAM 500 MG PO TABS: 500.0000 mg | ORAL_TABLET | Freq: Two times a day (BID) | ORAL | 11 refills | Status: DC

## 2019-02-08 MED ORDER — PHENOBARBITAL 32.4 MG PO TABS: 32.4000 mg | ORAL_TABLET | Freq: Four times a day (QID) | ORAL | 4 refills | Status: DC

## 2019-02-08 MED ORDER — LEVETIRACETAM 500 MG PO TABS: 500.0000 mg | ORAL_TABLET | Freq: Two times a day (BID) | ORAL | 4 refills | Status: DC

## 2019-02-08 NOTE — Telephone Encounter (Addendum)
If pt calls back please give him [email or web address] to download on his phone for the visit today at 330pm. Tell him the video visit email was sent.  LEft vm for patient that email was sent to him for the visit to his gmail account. I requested a call back to make sure he downloaded the webex on his cell phone for video visit.

## 2019-02-08 NOTE — Telephone Encounter (Signed)
Phenobarbital fax to [phone].Fax receive and confirmed.

## 2019-02-08 NOTE — Progress Notes (Signed)
Virtual Visit via Telephone Note  I connected with [name] on 02/08/19 at  3:30 PM EDT by telephone and verified that I am speaking with the correct person using two identifiers.  The patient was at his home and I was in my office.  This was initially scheduled as a virtual video visit but due to technical difficulties video link could not be established hence it was converted to a telephone visit   I discussed the limitations, risks, security and privacy concerns of performing an evaluation and management service by telephone and the availability of in person appointments. I also discussed with the patient that there may be a patient responsible charge related to this service. The patient expressed understanding and agreed to proceed.   History of Present Illness: Patient was last seen on 01/13/2017.  He states is doing well has not had any recurrent seizures now for several years.  He remains on Keppra 500 twice daily and phenobarbital 32.4 mg 4 times daily.  He is tolerating both medications well without any dizziness, slurred speech, ataxia or gait imbalance.  He has not had any new neurological complaints or health issues over the last 2 years.  He has no new complaints.    Observations/Objective: Exam could not be done since this was a telephone visit only.  Patient stated that he did not have any new issues   Assessment and Plan: 58 year old male with past medical history of chronic anemia, coronary artery disease, congestive heart failure, diabetes, hyperlipidemia and tobacco abuse with longstanding history of seizure disorder which appears quite stable on Keppra and phenobarbital.   Follow Up Instructions: Continue Keppra 500 twice daily and phenobarbital 32.4 mg 1 tablet 4 times daily.  Patient was counseled to be compliant with his medications and to avoid seizure provoking stimuli like sleep deprivation, medication noncompliance and alcohol.  He was given refill for a year for both  medications.  He was advised to return for follow-up in 1 year.   I discussed the assessment and treatment plan with the patient. The patient was provided an opportunity to ask questions and all were answered. The patient agreed with the plan and demonstrated an understanding of the instructions.   The patient was advised to call back or seek an in-person evaluation if the symptoms worsen or if the condition fails to improve as anticipated.  I provided 15 minutes of non-face-to-face time during this encounter.   Antony Contras, MD

## 2019-03-04 ENCOUNTER — Other Ambulatory Visit: Payer: Self-pay | Admitting: Physician Assistant

## 2019-03-04 DIAGNOSIS — I214 Non-ST elevation (NSTEMI) myocardial infarction: Secondary | ICD-10-CM

## 2019-03-04 DIAGNOSIS — Z9119 Patient's noncompliance with other medical treatment and regimen: Secondary | ICD-10-CM

## 2019-03-04 DIAGNOSIS — I340 Nonrheumatic mitral (valve) insufficiency: Secondary | ICD-10-CM

## 2019-03-04 DIAGNOSIS — I10 Essential (primary) hypertension: Secondary | ICD-10-CM

## 2019-03-04 DIAGNOSIS — Z91199 Patient's noncompliance with other medical treatment and regimen due to unspecified reason: Secondary | ICD-10-CM

## 2019-03-04 DIAGNOSIS — I25118 Atherosclerotic heart disease of native coronary artery with other forms of angina pectoris: Secondary | ICD-10-CM

## 2019-03-18 ENCOUNTER — Ambulatory Visit (INDEPENDENT_AMBULATORY_CARE_PROVIDER_SITE_OTHER)
Admission: RE | Admit: 2019-03-18 | Discharge: 2019-03-18 | Disposition: A | Discharge status: Home / Self Care | Payer: Medicare Other | Source: Ambulatory Visit | Attending: Family Medicine | Admitting: Family Medicine

## 2019-03-18 ENCOUNTER — Other Ambulatory Visit: Payer: Self-pay

## 2019-03-18 ENCOUNTER — Ambulatory Visit (INDEPENDENT_AMBULATORY_CARE_PROVIDER_SITE_OTHER): Payer: Medicare Other | Admitting: Family Medicine

## 2019-03-18 ENCOUNTER — Encounter: Payer: Self-pay | Admitting: Family Medicine

## 2019-03-18 VITALS — BP 120/74 | HR 84 | Ht 74.0 in | Wt 225.0 lb

## 2019-03-18 DIAGNOSIS — S199XXA Unspecified injury of neck, initial encounter: Secondary | ICD-10-CM | POA: Diagnosis not present

## 2019-03-18 DIAGNOSIS — M549 Dorsalgia, unspecified: Secondary | ICD-10-CM

## 2019-03-18 DIAGNOSIS — S3992XA Unspecified injury of lower back, initial encounter: Secondary | ICD-10-CM | POA: Diagnosis not present

## 2019-03-18 DIAGNOSIS — M542 Cervicalgia: Secondary | ICD-10-CM | POA: Diagnosis not present

## 2019-03-18 DIAGNOSIS — M545 Low back pain: Secondary | ICD-10-CM | POA: Diagnosis not present

## 2019-03-18 DIAGNOSIS — S134XXA Sprain of ligaments of cervical spine, initial encounter: Secondary | ICD-10-CM | POA: Diagnosis not present

## 2019-03-18 MED ORDER — METHYLPREDNISOLONE ACETATE 80 MG/ML IJ SUSP
80.0000 mg | Freq: Once | INTRAMUSCULAR | Status: AC
  Administered 2019-03-18: 80 mg via INTRAMUSCULAR

## 2019-03-18 MED ORDER — KETOROLAC TROMETHAMINE 60 MG/2ML IM SOLN
60.0000 mg | Freq: Once | INTRAMUSCULAR | Status: AC
  Administered 2019-03-18: 16:00:00 60 mg via INTRAMUSCULAR

## 2019-03-18 MED ORDER — GABAPENTIN 100 MG PO CAPS: 200.0000 mg | ORAL_CAPSULE | Freq: Every day | ORAL | 3 refills | Status: DC
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
## 2019-03-18 NOTE — Patient Instructions (Addendum)
Good to see you.  2 injections today  Ice 20 minutes 2 times daily. Usually after activity and before bed. Exercises 3 times a week.  xrays downstairs Gabapentin 200mg  at night to help with sleep and pain  See me again virtually for 2 weeks

## 2019-03-19 ENCOUNTER — Encounter: Payer: Self-pay | Admitting: Family Medicine

## 2019-03-19 DIAGNOSIS — S134XXA Sprain of ligaments of cervical spine, initial encounter: Secondary | ICD-10-CM | POA: Insufficient documentation
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
## 2019-03-31 ENCOUNTER — Encounter: Payer: Medicare Other | Admitting: Family Medicine

## 2019-03-31 ENCOUNTER — Other Ambulatory Visit: Payer: Self-pay

## 2019-03-31 ENCOUNTER — Encounter: Payer: Self-pay | Admitting: Family Medicine

## 2019-04-05 ENCOUNTER — Telehealth: Payer: Self-pay

## 2019-04-05 NOTE — Telephone Encounter (Signed)
Left message for patient to call back to reschedule.

## 2019-04-12 ENCOUNTER — Telehealth: Payer: Self-pay | Admitting: Internal Medicine

## 2019-04-12 ENCOUNTER — Encounter: Payer: Self-pay | Admitting: Internal Medicine

## 2019-04-12 ENCOUNTER — Other Ambulatory Visit (INDEPENDENT_AMBULATORY_CARE_PROVIDER_SITE_OTHER): Payer: Medicare Other

## 2019-04-12 ENCOUNTER — Ambulatory Visit (INDEPENDENT_AMBULATORY_CARE_PROVIDER_SITE_OTHER): Payer: Medicare Other | Admitting: Internal Medicine

## 2019-04-12 ENCOUNTER — Other Ambulatory Visit: Payer: Self-pay

## 2019-04-12 ENCOUNTER — Other Ambulatory Visit: Payer: Self-pay | Admitting: Internal Medicine

## 2019-04-12 VITALS — BP 130/82 | HR 82 | Temp 98.5°F | Ht 74.0 in | Wt 226.0 lb

## 2019-04-12 DIAGNOSIS — E559 Vitamin D deficiency, unspecified: Secondary | ICD-10-CM

## 2019-04-12 DIAGNOSIS — R739 Hyperglycemia, unspecified: Secondary | ICD-10-CM

## 2019-04-12 DIAGNOSIS — E611 Iron deficiency: Secondary | ICD-10-CM

## 2019-04-12 DIAGNOSIS — I5042 Chronic combined systolic (congestive) and diastolic (congestive) heart failure: Secondary | ICD-10-CM

## 2019-04-12 DIAGNOSIS — J309 Allergic rhinitis, unspecified: Secondary | ICD-10-CM | POA: Diagnosis not present

## 2019-04-12 DIAGNOSIS — Z0001 Encounter for general adult medical examination with abnormal findings: Secondary | ICD-10-CM | POA: Diagnosis not present

## 2019-04-12 DIAGNOSIS — Z23 Encounter for immunization: Secondary | ICD-10-CM

## 2019-04-12 DIAGNOSIS — Z0000 Encounter for general adult medical examination without abnormal findings: Secondary | ICD-10-CM | POA: Diagnosis not present

## 2019-04-12 DIAGNOSIS — H6982 Other specified disorders of Eustachian tube, left ear: Secondary | ICD-10-CM

## 2019-04-12 DIAGNOSIS — E119 Type 2 diabetes mellitus without complications: Secondary | ICD-10-CM | POA: Insufficient documentation

## 2019-04-12 DIAGNOSIS — H6992 Unspecified Eustachian tube disorder, left ear: Secondary | ICD-10-CM

## 2019-04-12 DIAGNOSIS — E538 Deficiency of other specified B group vitamins: Secondary | ICD-10-CM

## 2019-04-12 DIAGNOSIS — E1165 Type 2 diabetes mellitus with hyperglycemia: Secondary | ICD-10-CM | POA: Insufficient documentation

## 2019-04-12 LAB — BASIC METABOLIC PANEL
BUN: 11 mg/dL (ref 6–23)
CO2: 24 mEq/L (ref 19–32)
Calcium: 8.6 mg/dL (ref 8.4–10.5)
Chloride: 107 mEq/L (ref 96–112)
Creatinine, Ser: 1.03 mg/dL (ref 0.40–1.50)
GFR: 90.05 mL/min (ref >60.00)
Glucose, Bld: 101 mg/dL — ABNORMAL HIGH (ref 70–99)
Potassium: 4.2 mEq/L (ref 3.5–5.1)
Sodium: 138 mEq/L (ref 135–145)

## 2019-04-12 LAB — CBC WITH DIFFERENTIAL/PLATELET
Basophils Absolute: 0.1 10*3/uL (ref 0.0–0.1)
Basophils Relative: 1.1 % (ref 0.0–3.0)
Eosinophils Absolute: 0.4 10*3/uL (ref 0.0–0.7)
Eosinophils Relative: 5.1 % — ABNORMAL HIGH (ref 0.0–5.0)
HCT: 32.9 % — ABNORMAL LOW (ref 39.0–52.0)
Hemoglobin: 11 g/dL — ABNORMAL LOW (ref 13.0–17.0)
Lymphocytes Relative: 30.4 % (ref 12.0–46.0)
Lymphs Abs: 2.1 10*3/uL (ref 0.7–4.0)
MCHC: 33.6 g/dL (ref 30.0–36.0)
MCV: 87.7 fl (ref 78.0–100.0)
Monocytes Absolute: 0.8 10*3/uL (ref 0.1–1.0)
Monocytes Relative: 11.5 % (ref 3.0–12.0)
Neutro Abs: 3.6 10*3/uL (ref 1.4–7.7)
Neutrophils Relative %: 51.9 % (ref 43.0–77.0)
Platelets: 360 10*3/uL (ref 150.0–400.0)
RBC: 3.74 Mil/uL — ABNORMAL LOW (ref 4.22–5.81)
RDW: 17.9 % — ABNORMAL HIGH (ref 11.5–15.5)
WBC: 7 10*3/uL (ref 4.0–10.5)

## 2019-04-12 LAB — URINALYSIS, ROUTINE W REFLEX MICROSCOPIC
Bilirubin Urine: NEGATIVE
Ketones, ur: NEGATIVE
Leukocytes,Ua: NEGATIVE
Nitrite: NEGATIVE
Specific Gravity, Urine: 1.01 (ref 1.000–1.030)
Total Protein, Urine: NEGATIVE
Urine Glucose: NEGATIVE
Urobilinogen, UA: 0.2 (ref 0.0–1.0)
WBC, UA: NONE SEEN (ref 0–?)
pH: 6 (ref 5.0–8.0)

## 2019-04-12 LAB — HEPATIC FUNCTION PANEL
ALT: 16 U/L (ref 0–53)
AST: 13 U/L (ref 0–37)
Albumin: 4.3 g/dL (ref 3.5–5.2)
Alkaline Phosphatase: 66 U/L (ref 39–117)
Bilirubin, Direct: 0.1 mg/dL (ref 0.0–0.3)
Total Bilirubin: 0.3 mg/dL (ref 0.2–1.2)
Total Protein: 7.2 g/dL (ref 6.0–8.3)

## 2019-04-12 LAB — LIPID PANEL
Cholesterol: 110 mg/dL (ref 0–200)
HDL: 36.3 mg/dL — ABNORMAL LOW (ref >39.00)
LDL Cholesterol: 66 mg/dL (ref 0–99)
NonHDL: 74.03
Total CHOL/HDL Ratio: 3
Triglycerides: 39 mg/dL (ref 0.0–149.0)
VLDL: 7.8 mg/dL (ref 0.0–40.0)

## 2019-04-12 LAB — HEMOGLOBIN A1C: Hgb A1c MFr Bld: 6.6 % — ABNORMAL HIGH (ref 4.6–6.5)

## 2019-04-12 LAB — VITAMIN D 25 HYDROXY (VIT D DEFICIENCY, FRACTURES): VITD: 22.8 ng/mL — ABNORMAL LOW (ref 30.00–100.00)

## 2019-04-12 LAB — TSH: TSH: 1.17 u[IU]/mL (ref 0.35–4.50)

## 2019-04-12 LAB — IBC PANEL
Iron: 60 ug/dL (ref 42–165)
Saturation Ratios: 17.2 % — ABNORMAL LOW (ref 20.0–50.0)
Transferrin: 249 mg/dL (ref 212.0–360.0)

## 2019-04-12 LAB — VITAMIN B12: Vitamin B-12: 719 pg/mL (ref 211–911)

## 2019-04-12 LAB — FERRITIN: Ferritin: 47.5 ng/mL (ref 22.0–322.0)

## 2019-04-12 LAB — PSA: PSA: 1.19 ng/mL (ref 0.10–4.00)

## 2019-04-12 MED ORDER — FEXOFENADINE HCL 180 MG PO TABS: 180.0000 mg | ORAL_TABLET | Freq: Every day | ORAL | 2 refills | Status: DC

## 2019-04-12 MED ORDER — TRIAMCINOLONE ACETONIDE 55 MCG/ACT NA AERO: 2.0000 | INHALATION_SPRAY | Freq: Every day | NASAL | 12 refills | Status: DC

## 2019-04-12 MED ORDER — VITAMIN D (ERGOCALCIFEROL) 1.25 MG (50000 UNIT) PO CAPS: 50000.0000 [IU] | ORAL_CAPSULE | ORAL | 0 refills | Status: DC

## 2019-04-12 NOTE — Patient Instructions (Addendum)
You had the Tdap tetanus shot today  Please take all new medication as prescribed  - the allegra and nasacort for allergies  You can also take Mucinex (or it's generic off brand) for ear congestion, and tylenol as needed for pain.  Please continue all other medications as before, and refills have been done if requested.  Please have the pharmacy call with any other refills you may need.  Please continue your efforts at being more active, low cholesterol diet, and weight control.  You are otherwise up to date with prevention measures today.  Please keep your appointments with your specialists as you may have planned  Please go to the LAB in the Basement (turn left off the elevator) for the tests to be done today  You will be contacted by phone if any changes need to be made immediately.  Otherwise, you will receive a letter about your results with an explanation, but please check with MyChart first.  Please remember to sign up for MyChart if you have not done so, as this will be important to you in the future with finding out test results, communicating by private email, and scheduling acute appointments online when needed.  Please return in 6 months, or sooner if needed

## 2019-04-12 NOTE — Telephone Encounter (Signed)
error 

## 2019-04-12 NOTE — Assessment & Plan Note (Signed)

## 2019-04-12 NOTE — Assessment & Plan Note (Signed)
Mild to mod, for allegra and nasacort asd,   to f/u any worsening symptoms or concerns 

## 2019-04-12 NOTE — Assessment & Plan Note (Signed)
Helen for mucinex otc prn,  to f/u any worsening symptoms or concerns

## 2019-04-12 NOTE — Assessment & Plan Note (Signed)
stable overall by history and exam, recent data reviewed with pt, and pt to continue medical treatment as before,  to f/u any worsening symptoms or concerns  

## 2019-04-12 NOTE — Progress Notes (Signed)
Subjective:    Patient ID: [name], male    DOB: 12-08-1960, 58 y.o.   MRN: [id number]  HPI  Here for wellness and f/u;  Overall doing ok;  Pt denies Chest pain, worsening SOB, DOE, wheezing, orthopnea, PND, worsening LE edema, palpitations, dizziness or syncope.  Pt denies neurological change such as new headache, facial or extremity weakness.  Pt denies polydipsia, polyuria, or low sugar symptoms. Pt states overall good compliance with treatment and medications, good tolerability, and has been trying to follow appropriate diet.  Pt denies worsening depressive symptoms, suicidal ideation or panic. No fever, night sweats, wt loss, loss of appetite, or other constitutional symptoms.  Pt states good ability with ADL's, has low fall risk, home safety reviewed and adequate, no other significant changes in hearing or vision, and not active with exercise.  Due for Tdap BP at home < 140/90 Also with distal LUE itchy rash for 3 days after working in the yard by the fence with known poison oak, now much better.    Also Does have several wks ongoing nasal allergy symptoms with clearish congestion, itch and sneezing, without fever, pain, ST, cough, swelling or wheezing, and most recently with reduced hearing for the left ear. Past Medical History:  Diagnosis Date  . Anemia 01/01/2017  . CAD (coronary artery disease)    a. Overlapping BMS-prox LCx in 2009 b. NSTEMI 2012 due to prox LCx ISR s/p PTCA alone; EF 45% w/ basal inf HK. Had run out of Plavix 1 week prior. c. NSTEMI 05/2015 after running out of Plavix - cath with 3V CAD, Patient left AMA prior to agreeing to CABG. // d. LHC 8/16: LM 60; LAD mid 60, D1 75; RI 50, 80; LCx prox stent 100; RCA prox 100; EF 45-50 >> seen by TCTS - declined CABG >> Med Rx.  . Chronic combined systolic and diastolic CHF (congestive heart failure) ([place]) 06/06/2015   a - Echo 8/16:  EF 40-45, inf-lat AK, Gr 2 diastolic dysfunction, mild AI, mod MR // b - TEE 8/16: EF 45-50,  inf HK, mod MR, mild TR // c - Echo 4/18: EF 40-45, ant-lat and inf-lat HK, Gr 2 DD, mild AI, mild to mod MR, severe LAE, PASP 29 // d - Echo 4/19: Mild concentric LVH, EF 40-45, anterolateral/inferolateral HK, mild AI, moderate MR, mild LAE, mild TR, PASP 25   . Clotting disorder ([place])   . Diabetes mellitus without complication ([place])   . HLD (hyperlipidemia)   . Hyperglycemia A1C 6.0 05/2015.  [name] Hypertension   . MI (myocardial infarction) ([place]) x 7  . Poor compliance   . Seizure disorder ([place])   . Seizures ([place])   . TOBACCO ABUSE 02/22/2009  . Valvular heart disease    a. Echo 05/2015: mild AI, mod MR.   Past Surgical History:  Procedure Laterality Date  . CARDIAC CATHETERIZATION [name] 05/29/2015   Procedure: Left Heart Cath and Coronary Angiography;  Surgeon: Belva Crome, MD;  Location: [place] [facility];  Service: Cardiovascular;  Laterality: [name];  . COLONOSCOPY    . CORONARY ANGIOPLASTY  03/2011   PTCA-prox LCx ISR  . CORONARY ANGIOPLASTY WITH STENT PLACEMENT  10/2007   BMS-prox LCx  . LEFT HEART CATHETERIZATION WITH CORONARY ANGIOGRAM [name] 10/13/2013   Procedure: LEFT HEART CATHETERIZATION WITH CORONARY ANGIOGRAM;  Surgeon: Blane Ohara, MD;  Location: [facility] CATH LAB;  Service: Cardiovascular;  Laterality: [name];  . TEE WITHOUT CARDIOVERSION [name] 06/16/2015  Procedure: TRANSESOPHAGEAL ECHOCARDIOGRAM (TEE);  Surgeon: Josue Hector, MD;  Location: [facility] ENDOSCOPY;  Service: Cardiovascular;  Laterality: [name];    reports that he has been smoking cigars. He has smoked for the past 35.00 years. He has never used smokeless tobacco. He reports that he does not drink alcohol or use drugs. family history includes Coronary artery disease in his maternal aunt; Heart attack in his maternal uncle; Heart disease in his maternal uncle; Hypertension in an other family member; Stroke in his maternal uncle. No Known Allergies Current Outpatient Medications on File Prior to Visit  Medication Sig Dispense Refill   . AMBULATORY NON FORMULARY MEDICATION Medication Name: Black seed oil    . carvedilol (COREG) 3.125 MG tablet TAKE 1 TABLET(3.125 MG) BY MOUTH TWICE DAILY 180 tablet 2  . clopidogrel (PLAVIX) 75 MG tablet Take 1 tablet (75 mg total) by mouth daily. Please make annual appt with Dr. Burt Knack for future refills. Thank you 90 tablet 0  . ferrous sulfate 325 (65 FE) MG tablet Take 1 tablet (325 mg total) by mouth daily with breakfast. 90 tablet 3  . Flaxseed, Linseed, (FLAX PO) Take by mouth.    . furosemide (LASIX) 40 MG tablet Take 1 tablet (40 mg total) by mouth daily as needed for edema. Take for weight gain (3 lbs or more in 1 day) or leg swelling 90 tablet 3  . isosorbide mononitrate (IMDUR) 30 MG 24 hr tablet TAKE 1/2 TABLET BY MOUTH DAILY 15 tablet 6  . levETIRAcetam (KEPPRA) 500 MG tablet Take 1 tablet (500 mg total) by mouth 2 (two) times daily. 180 tablet 4  . lisinopril (PRINIVIL,ZESTRIL) 2.5 MG tablet TAKE 1 TABLET BY MOUTH ONCE DAILY 90 tablet 3  . nitroGLYCERIN (NITROSTAT) 0.4 MG SL tablet place 1 tablet under the tongue if needed every 5 minutes for chest pain for 3 doses 25 tablet 3  . OVER THE COUNTER MEDICATION Take 1 drop by mouth daily. BLACKSEED OIL 1 DROP BY MOUTH ONCE DAILY    . PHENobarbital (LUMINAL) 32.4 MG tablet Take 1 tablet (32.4 mg total) by mouth 4 (four) times daily. 120 tablet 4  . vitamin B-12 (CYANOCOBALAMIN) 1000 MCG tablet Take 500 mcg by mouth 3 (three) times daily.     . Zinc 30 MG TABS Take 2 tablets by mouth daily.     [name] ezetimibe (ZETIA) 10 MG tablet Take 1 tablet (10 mg total) by mouth daily. 90 tablet 3  . gabapentin (NEURONTIN) 100 MG capsule Take 2 capsules (200 mg total) by mouth at bedtime. (Patient not taking: Reported on 04/12/2019) 60 capsule 3  . rosuvastatin (CRESTOR) 40 MG tablet Take 1 tablet (40 mg total) by mouth daily. 90 tablet 3   No current facility-administered medications on file prior to visit.    Review of Systems Constitutional:  Negative for other unusual diaphoresis, sweats, appetite or weight changes HENT: Negative for other worsening hearing loss, ear pain, facial swelling, mouth sores or neck stiffness.   Eyes: Negative for other worsening pain, redness or other visual disturbance.  Respiratory: Negative for other stridor or swelling Cardiovascular: Negative for other palpitations or other chest pain  Gastrointestinal: Negative for worsening diarrhea or loose stools, blood in stool, distention or other pain Genitourinary: Negative for hematuria, flank pain or other change in urine volume.  Musculoskeletal: Negative for myalgias or other joint swelling.  Skin: Negative for other color change, or other wound or worsening drainage.  Neurological: Negative for other syncope or numbness.  Hematological: Negative for other adenopathy or swelling Psychiatric/Behavioral: Negative for hallucinations, other worsening agitation, SI, self-injury, or new decreased concentration All other system neg per pt    Objective:   Physical Exam BP 130/82 (BP Location: Left Arm, Patient Position: Sitting, Cuff Size: Large)   Pulse 82   Temp 98.5 F (36.9 C) (Oral)   Ht 6\' 2"  (1.88 m)   Wt 226 lb (102.5 kg)   SpO2 95%   BMI 29.02 kg/m  VS noted,  Constitutional: Pt is oriented to person, place, and time. Appears well-developed and well-nourished, in no significant distress and comfortable Head: Normocephalic and atraumatic  Eyes: Conjunctivae and EOM are normal. Pupils are equal, round, and reactive to light Right Ear: External ear normal without discharge Left Ear: External ear normal without discharge Nose: Nose without discharge or deformity Bilat tm's with mild erythema.  Max sinus areas non tender.  Pharynx with mild erythema, no exudate Bilat tm's with mild erythema.  Max sinus areas non tender.  Pharynx with mild erythema, no exudate Mouth/Throat: Oropharynx is without other ulcerations and moist  Neck: Normal range of  motion. Neck supple. No JVD present. No tracheal deviation present or significant neck LA or mass Cardiovascular: Normal rate, regular rhythm, normal heart sounds and intact distal pulses.   Pulmonary/Chest: WOB normal and breath sounds without rales or wheezing  Abdominal: Soft. Bowel sounds are normal. NT. No HSM  Musculoskeletal: Normal range of motion. Exhibits no edema Lymphadenopathy: Has no other cervical adenopathy.  Neurological: Pt is alert and oriented to person, place, and time. Pt has normal reflexes. No cranial nerve deficit. Motor grossly intact, Gait intact Skin: Skin is warm and dry. No rash noted or new ulcerations Psychiatric:  Has normal mood and affect. Behavior is normal without agitation No other exam findings Lab Results  Component Value Date   WBC 7.0 04/12/2019   HGB 11.0 (L) 04/12/2019   HCT 32.9 (L) 04/12/2019   PLT 360.0 04/12/2019   GLUCOSE 101 (H) 04/12/2019   CHOL 110 04/12/2019   TRIG 39.0 04/12/2019   HDL 36.30 (L) 04/12/2019   LDLCALC 66 04/12/2019   ALT 16 04/12/2019   AST 13 04/12/2019   NA 138 04/12/2019   K 4.2 04/12/2019   CL 107 04/12/2019   CREATININE 1.03 04/12/2019   BUN 11 04/12/2019   CO2 24 04/12/2019   TSH 1.17 04/12/2019   PSA 1.19 04/12/2019   INR 1.01 05/27/2015   HGBA1C 6.6 (H) 04/12/2019      Assessment & Plan:

## 2019-04-13 ENCOUNTER — Telehealth: Payer: Self-pay

## 2019-04-13 ENCOUNTER — Encounter: Payer: Self-pay | Admitting: Family Medicine

## 2019-04-13 ENCOUNTER — Ambulatory Visit (INDEPENDENT_AMBULATORY_CARE_PROVIDER_SITE_OTHER): Payer: Medicare Other | Admitting: Family Medicine

## 2019-04-13 DIAGNOSIS — S134XXA Sprain of ligaments of cervical spine, initial encounter: Secondary | ICD-10-CM | POA: Diagnosis not present

## 2019-04-13 DIAGNOSIS — M5030 Other cervical disc degeneration, unspecified cervical region: Secondary | ICD-10-CM | POA: Diagnosis not present

## 2019-04-13 NOTE — Telephone Encounter (Signed)
-----   Message from Biagio Borg, MD sent at 04/12/2019  5:37 PM EDT ----- Left message on MyChart, pt to cont same tx except  The test results show that your current treatment is OK, except the Vitamin D level is low.  Please take Vitamin D 50000 units weekly for 12 weeks, then plan to change to OTC Vitamin D3 at 2000 units per day, indefinitely.[name] to please inform pt, I will do rx
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
## 2019-04-13 NOTE — Telephone Encounter (Signed)
Pt has been informed of results and expressed understanding.  °

## 2019-04-14 ENCOUNTER — Telehealth: Payer: Self-pay

## 2019-04-14 ENCOUNTER — Telehealth: Payer: Self-pay | Admitting: *Deleted

## 2019-04-14 NOTE — Telephone Encounter (Signed)
DMV form completed for patient. Fee for form paid. Form given to [facility] records to fax.

## 2019-04-14 NOTE — Telephone Encounter (Signed)
Pt form faxed to [facility] on 04/14/19

## 2019-04-22 ENCOUNTER — Other Ambulatory Visit: Payer: Self-pay | Admitting: Cardiovascular Disease

## 2019-04-22 DIAGNOSIS — I10 Essential (primary) hypertension: Secondary | ICD-10-CM

## 2019-04-22 DIAGNOSIS — I340 Nonrheumatic mitral (valve) insufficiency: Secondary | ICD-10-CM

## 2019-04-22 DIAGNOSIS — I25118 Atherosclerotic heart disease of native coronary artery with other forms of angina pectoris: Secondary | ICD-10-CM

## 2019-04-22 DIAGNOSIS — Z9119 Patient's noncompliance with other medical treatment and regimen: Secondary | ICD-10-CM

## 2019-04-22 DIAGNOSIS — I214 Non-ST elevation (NSTEMI) myocardial infarction: Secondary | ICD-10-CM

## 2019-04-22 DIAGNOSIS — Z91199 Patient's noncompliance with other medical treatment and regimen due to unspecified reason: Secondary | ICD-10-CM

## 2019-04-30 ENCOUNTER — Encounter: Payer: Medicare Other | Admitting: Nurse Practitioner

## 2019-07-13 NOTE — Progress Notes (Addendum)
Subjective:   [name] is a 58 y.o. male who presents for Medicare Annual/Subsequent preventive examination. I connected with patient by a telephone and verified that I am speaking with the correct person using two identifiers. Patient stated full name and DOB. Patient gave permission to continue with telephonic visit. Patient's location was at home and Nurse's location was at [place] office.   Review of Systems:     Sleep patterns: feels rested on waking, gets up 1-2 times nightly to void and sleeps 7-8 hours nightly.    Home Safety/Smoke Alarms: Feels safe in home. Smoke alarms in place.  Living environment; residence and Firearm Safety: 1-story house/ trailer. Lives alone, no needs for DME, good support system Seat Belt Safety/Bike Helmet: Wears seat belt.   PSA-  Lab Results  Component Value Date   PSA 1.19 04/12/2019   PSA 1.14 04/29/2018       Objective:    Vitals: There were no vitals taken for this visit.  There is no height or weight on file to calculate BMI.  Advanced Directives 07/09/2018 06/25/2017 06/30/2016 04/20/2016 06/16/2015 05/27/2015 05/26/2015  Does Patient Have a Medical Advance Directive? No No No No No No No  Does patient want to make changes to medical advance directive? Yes (ED - Information included in AVS) - - - - - -  Would patient like information on creating a medical advance directive? - Yes (ED - Information included in AVS) No - patient declined information - - No - patient declined information -  Pre-existing out of facility DNR order (yellow form or pink MOST form) - - - - - - -    Tobacco Social History   Tobacco Use  Smoking Status Current Some Day Smoker  . Years: 35.00  . Types: Cigars  Smokeless Tobacco Never Used  Tobacco Comment   smoke cigars 2 per day     Ready to quit: Not Answered Counseling given: Not Answered Comment: smoke cigars 2 per day  Past Medical History:  Diagnosis Date  . Anemia 01/01/2017  . CAD (coronary  artery disease)    a. Overlapping BMS-prox LCx in 2009 b. NSTEMI 2012 due to prox LCx ISR s/p PTCA alone; EF 45% w/ basal inf HK. Had run out of Plavix 1 week prior. c. NSTEMI 05/2015 after running out of Plavix - cath with 3V CAD, Patient left AMA prior to agreeing to CABG. // d. LHC 8/16: LM 60; LAD mid 60, D1 75; RI 50, 80; LCx prox stent 100; RCA prox 100; EF 45-50 >> seen by TCTS - declined CABG >> Med Rx.  . Chronic combined systolic and diastolic CHF (congestive heart failure) ([place]) 06/06/2015   a - Echo 8/16:  EF 40-45, inf-lat AK, Gr 2 diastolic dysfunction, mild AI, mod MR // b - TEE 8/16: EF 45-50, inf HK, mod MR, mild TR // c - Echo 4/18: EF 40-45, ant-lat and inf-lat HK, Gr 2 DD, mild AI, mild to mod MR, severe LAE, PASP 29 // d - Echo 4/19: Mild concentric LVH, EF 40-45, anterolateral/inferolateral HK, mild AI, moderate MR, mild LAE, mild TR, PASP 25   . Clotting disorder ([place])   . Diabetes mellitus without complication ([place])   . HLD (hyperlipidemia)   . Hyperglycemia A1C 6.0 05/2015.  [name] Hypertension   . MI (myocardial infarction) ([place]) x 7  . Poor compliance   . Seizure disorder ([place])   . Seizures ([place])   . TOBACCO ABUSE 02/22/2009  .  Valvular heart disease    a. Echo 05/2015: mild AI, mod MR.   Past Surgical History:  Procedure Laterality Date  . CARDIAC CATHETERIZATION [name] 05/29/2015   Procedure: Left Heart Cath and Coronary Angiography;  Surgeon: Belva Crome, MD;  Location: [place] [facility] LAB;  Service: Cardiovascular;  Laterality: [name];  . COLONOSCOPY    . CORONARY ANGIOPLASTY  03/2011   PTCA-prox LCx ISR  . CORONARY ANGIOPLASTY WITH STENT PLACEMENT  10/2007   BMS-prox LCx  . LEFT HEART CATHETERIZATION WITH CORONARY ANGIOGRAM [name] 10/13/2013   Procedure: LEFT HEART CATHETERIZATION WITH CORONARY ANGIOGRAM;  Surgeon: Blane Ohara, MD;  Location: [facility] CATH LAB;  Service: Cardiovascular;  Laterality: [name];  . TEE WITHOUT CARDIOVERSION [name] 06/16/2015   Procedure: TRANSESOPHAGEAL  ECHOCARDIOGRAM (TEE);  Surgeon: Josue Hector, MD;  Location: [facility] ENDOSCOPY;  Service: Cardiovascular;  Laterality: [name];   Family History  Problem Relation Age of Onset  . Coronary artery disease Maternal Aunt   . Heart disease Maternal Uncle   . Stroke Maternal Uncle   . Heart attack Maternal Uncle        X3  . Hypertension Other   . Colon cancer Neg Hx   . Stomach cancer Neg Hx   . Rectal cancer Neg Hx   . Pancreatic cancer Neg Hx    Social History   Socioeconomic History  . Marital status: Single    Spouse name: Not on file  . Number of children: 0  . Years of education: 12th  . Highest education level: Not on file  Occupational History  . Occupation: former Dealer  Social Needs  . Financial resource strain: Not hard at all  . Food insecurity    Worry: Never true    Inability: Never true  . Transportation needs    Medical: No    Non-medical: No  Tobacco Use  . Smoking status: Current Some Day Smoker    Years: 35.00    Types: Cigars  . Smokeless tobacco: Never Used  . Tobacco comment: smoke cigars 2 per day  Substance and Sexual Activity  . Alcohol use: No  . Drug use: No  . Sexual activity: Not Currently  Lifestyle  . Physical activity    Days per week: 0 days    Minutes per session: 0 min  . Stress: To some extent  Relationships  . Social connections    Talks on phone: More than three times a week    Gets together: More than three times a week    Attends religious service: More than 4 times per year    Active member of club or organization: Yes    Attends meetings of clubs or organizations: More than 4 times per year    Relationship status: Not on file  Other Topics Concern  . Not on file  Social History Narrative   Fun/Hobby: Likes the lake or ocean.     Outpatient Encounter Medications as of 07/14/2019  Medication Sig  . AMBULATORY NON FORMULARY MEDICATION Medication Name: Black seed oil  . carvedilol (COREG) 3.125 MG tablet TAKE 1 TABLET(3.125  MG) BY MOUTH TWICE DAILY  . clopidogrel (PLAVIX) 75 MG tablet Take 1 tablet (75 mg total) by mouth daily. Please make annual appt with Dr. Burt Knack for future refills. Thank you 1st attempt.  . ezetimibe (ZETIA) 10 MG tablet Take 1 tablet (10 mg total) by mouth daily.  . ferrous sulfate 325 (65 FE) MG tablet Take 1 tablet (325 mg total)  by mouth daily with breakfast.  . fexofenadine (ALLEGRA) 180 MG tablet Take 1 tablet (180 mg total) by mouth daily.  . Flaxseed, Linseed, (FLAX PO) Take by mouth.  . furosemide (LASIX) 40 MG tablet Take 1 tablet (40 mg total) by mouth daily as needed for edema. Take for weight gain (3 lbs or more in 1 day) or leg swelling  . gabapentin (NEURONTIN) 100 MG capsule Take 2 capsules (200 mg total) by mouth at bedtime. (Patient not taking: Reported on 04/12/2019)  . isosorbide mononitrate (IMDUR) 30 MG 24 hr tablet TAKE 1/2 TABLET BY MOUTH DAILY  . levETIRAcetam (KEPPRA) 500 MG tablet Take 1 tablet (500 mg total) by mouth 2 (two) times daily.  [name] lisinopril (PRINIVIL,ZESTRIL) 2.5 MG tablet TAKE 1 TABLET BY MOUTH ONCE DAILY  . nitroGLYCERIN (NITROSTAT) 0.4 MG SL tablet place 1 tablet under the tongue if needed every 5 minutes for chest pain for 3 doses  . OVER THE COUNTER MEDICATION Take 1 drop by mouth daily. BLACKSEED OIL 1 DROP BY MOUTH ONCE DAILY  . PHENobarbital (LUMINAL) 32.4 MG tablet Take 1 tablet (32.4 mg total) by mouth 4 (four) times daily.  . rosuvastatin (CRESTOR) 40 MG tablet Take 1 tablet (40 mg total) by mouth daily.  [name] triamcinolone (NASACORT) 55 MCG/ACT AERO nasal inhaler Place 2 sprays into the nose daily.  . vitamin B-12 (CYANOCOBALAMIN) 1000 MCG tablet Take 500 mcg by mouth 3 (three) times daily.   . Vitamin D, Ergocalciferol, (DRISDOL) 1.25 MG (50000 UT) CAPS capsule Take 1 capsule (50,000 Units total) by mouth every 7 (seven) days.  . Zinc 30 MG TABS Take 2 tablets by mouth daily.    No facility-administered encounter medications on file as of  07/14/2019.     Activities of Daily Living No flowsheet data found.  Patient Care Team: Biagio Borg, MD as PCP - General (Internal Medicine) Sherren Mocha, MD as PCP - Cardiology (Cardiology) Magrinat, Virgie Dad, MD as Consulting Physician (Oncology)   Assessment:   This is a routine wellness examination for [place]. Physical assessment deferred to PCP.   Exercise Activities and Dietary recommendations   Diet (meal preparation, eat out, water intake, caffeinated beverages, dairy products, fruits and vegetables): in general, a "healthy" diet  , well balanced   Reviewed heart healthy diet. Encouraged patient to increase daily water and healthy fluid intake.  Goals    . Patient Stated     Go to the beach, meditate with God, and enjoy the calmness. Start to eat healthier by watching sodium and fat in my diet. I am going to check into Silver Sneakers.     . Worship God and reach as many people who need help as I can       Fall Risk Fall Risk  04/12/2019 07/09/2018 06/25/2017 12/08/2013  Falls in the past year? 0 No No No    Depression Screen PHQ 2/9 Scores 04/12/2019 07/09/2018 06/25/2017 12/08/2013  PHQ - 2 Score 0 1 0 0  PHQ- 9 Score - 1 0 -    Cognitive Function       Ad8 score reviewed for issues:  Issues making decisions: no  Less interest in hobbies / activities: no  Repeats questions, stories (family complaining): no  Trouble using ordinary gadgets (microwave, computer, phone):no  Forgets the month or year: no  Mismanaging finances: no  Remembering appts: no  Daily problems with thinking and/or memory: no Ad8 score is= 0  Immunization History  Administered Date(s) Administered  .  Tdap 04/12/2019   Screening Tests Health Maintenance  Topic Date Due  . INFLUENZA VACCINE  05/22/2019  . COLONOSCOPY  09/08/2023  . TETANUS/TDAP  04/11/2029  . Hepatitis C Screening  Completed  . HIV Screening  Completed        Plan:    Reviewed health maintenance  screenings with patient today and relevant education, vaccines, and/or referrals were provided.   I have personally reviewed and noted the following in the patient's chart:   . Medical and social history . Use of alcohol, tobacco or illicit drugs  . Current medications and supplements . Functional ability and status . Nutritional status . Physical activity . Advanced directives . List of other physicians . Screenings to include cognitive, depression, and falls . Referrals and appointments  In addition, I have reviewed and discussed with patient certain preventive protocols, quality metrics, and best practice recommendations. A written personalized care plan for preventive services as well as general preventive health recommendations were provided to patient.     Michiel Cowboy, RN  07/13/2019  Medical screening examination/treatment/procedure(s) were performed by non-physician practitioner and as supervising physician I was immediately available for consultation/collaboration. I agree with above. Cathlean Cower, MD

## 2019-07-14 ENCOUNTER — Ambulatory Visit (INDEPENDENT_AMBULATORY_CARE_PROVIDER_SITE_OTHER): Payer: Medicare Other | Admitting: *Deleted

## 2019-07-14 ENCOUNTER — Other Ambulatory Visit: Payer: Self-pay

## 2019-07-14 DIAGNOSIS — Z0000 Encounter for general adult medical examination without abnormal findings: Secondary | ICD-10-CM | POA: Diagnosis not present

## 2019-07-14 NOTE — Patient Instructions (Addendum)
Continue to eat heart healthy diet (full of fruits, vegetables, whole grains, lean protein, water--limit salt, fat, and sugar intake) and increase physical activity as tolerated.   [name] , Thank you for taking time to come for your Medicare Wellness Visit. I appreciate your ongoing commitment to your health goals. Please review the following plan we discussed and let me know if I can assist you in the future.   These are the goals we discussed: Goals    . Patient Stated     Go to the beach, meditate with God, and enjoy the calmness. Start to eat healthier by watching sodium and fat in my diet. I am going to check into Silver Sneakers and start to walk routinely.    . Worship God and reach as many people who need help as I can       This is a list of the screening recommended for you and due dates:  Health Maintenance  Topic Date Due  . Flu Shot  05/22/2019  . Colon Cancer Screening  09/08/2023  . Tetanus Vaccine  04/11/2029  .  Hepatitis C: One time screening is recommended by Center for Disease Control  (CDC) for  adults born from [birth year] through 1964.   Completed  . HIV Screening  Completed     Health Maintenance, Male Adopting a healthy lifestyle and getting preventive care are important in promoting health and wellness. Ask your health care provider about:  The right schedule for you to have regular tests and exams.  Things you can do on your own to prevent diseases and keep yourself healthy. What should I know about diet, weight, and exercise? Eat a healthy diet   Eat a diet that includes plenty of vegetables, fruits, low-fat dairy products, and lean protein.  Do not eat a lot of foods that are high in solid fats, added sugars, or sodium. Maintain a healthy weight Body mass index (BMI) is a measurement that can be used to identify possible weight problems. It estimates body fat based on height and weight. Your health care provider can help determine your BMI and help you  achieve or maintain a healthy weight. Get regular exercise Get regular exercise. This is one of the most important things you can do for your health. Most adults should:  Exercise for at least 150 minutes each week. The exercise should increase your heart rate and make you sweat (moderate-intensity exercise).  Do strengthening exercises at least twice a week. This is in addition to the moderate-intensity exercise.  Spend less time sitting. Even light physical activity can be beneficial. Watch cholesterol and blood lipids Have your blood tested for lipids and cholesterol at 57 years of age, then have this test every 5 years. You may need to have your cholesterol levels checked more often if:  Your lipid or cholesterol levels are high.  You are older than 57 years of age.  You are at high risk for heart disease. What should I know about cancer screening? Many types of cancers can be detected early and may often be prevented. Depending on your health history and family history, you may need to have cancer screening at various ages. This may include screening for:  Colorectal cancer.  Prostate cancer.  Skin cancer.  Lung cancer. What should I know about heart disease, diabetes, and high blood pressure? Blood pressure and heart disease  High blood pressure causes heart disease and increases the risk of stroke. This is more likely to  develop in people who have high blood pressure readings, are of African descent, or are overweight.  Talk with your health care provider about your target blood pressure readings.  Have your blood pressure checked: ? Every 3-5 years if you are 51-33 years of age. ? Every year if you are 62 years old or older.  If you are between the ages of 32 and 86 and are a current or former smoker, ask your health care provider if you should have a one-time screening for abdominal aortic aneurysm (AAA). Diabetes Have regular diabetes screenings. This checks your  fasting blood sugar level. Have the screening done:  Once every three years after age 83 if you are at a normal weight and have a low risk for diabetes.  More often and at a younger age if you are overweight or have a high risk for diabetes. What should I know about preventing infection? Hepatitis B If you have a higher risk for hepatitis B, you should be screened for this virus. Talk with your health care provider to find out if you are at risk for hepatitis B infection. Hepatitis C Blood testing is recommended for:  Everyone born from [birth year] through 1964.  Anyone with known risk factors for hepatitis C. Sexually transmitted infections (STIs)  You should be screened each year for STIs, including gonorrhea and chlamydia, if: ? You are sexually active and are younger than 57 years of age. ? You are older than 57 years of age and your health care provider tells you that you are at risk for this type of infection. ? Your sexual activity has changed since you were last screened, and you are at increased risk for chlamydia or gonorrhea. Ask your health care provider if you are at risk.  Ask your health care provider about whether you are at high risk for HIV. Your health care provider may recommend a prescription medicine to help prevent HIV infection. If you choose to take medicine to prevent HIV, you should first get tested for HIV. You should then be tested every 3 months for as long as you are taking the medicine. Follow these instructions at home: Lifestyle  Do not use any products that contain nicotine or tobacco, such as cigarettes, e-cigarettes, and chewing tobacco. If you need help quitting, ask your health care provider.  Do not use street drugs.  Do not share needles.  Ask your health care provider for help if you need support or information about quitting drugs. Alcohol use  Do not drink alcohol if your health care provider tells you not to drink.  If you drink  alcohol: ? Limit how much you have to 0-2 drinks a day. ? Be aware of how much alcohol is in your drink. In the [place] one drink equals one 12 oz bottle of beer (355 mL), one 5 oz glass of Davanna He (148 mL), or one 1 oz glass of hard liquor (44 mL). General instructions  Schedule regular health, dental, and eye exams.  Stay current with your vaccines.  Tell your health care provider if: ? You often feel depressed. ? You have ever been abused or do not feel safe at home. Summary  Adopting a healthy lifestyle and getting preventive care are important in promoting health and wellness.  Follow your health care provider's instructions about healthy diet, exercising, and getting tested or screened for diseases.  Follow your health care provider's instructions on monitoring your cholesterol and blood pressure. This information is not intended  to replace advice given to you by your health care provider. Make sure you discuss any questions you have with your health care provider. Document Released: 04/04/2008 Document Revised: 09/30/2018 Document Reviewed: 09/30/2018 Elsevier Patient Education  2020 [employer].

## 2019-07-16 ENCOUNTER — Other Ambulatory Visit: Payer: Self-pay | Admitting: Neurology

## 2019-07-18 ENCOUNTER — Other Ambulatory Visit: Payer: Self-pay | Admitting: Cardiovascular Disease

## 2019-07-18 DIAGNOSIS — I25118 Atherosclerotic heart disease of native coronary artery with other forms of angina pectoris: Secondary | ICD-10-CM

## 2019-07-18 DIAGNOSIS — I340 Nonrheumatic mitral (valve) insufficiency: Secondary | ICD-10-CM

## 2019-07-18 DIAGNOSIS — I214 Non-ST elevation (NSTEMI) myocardial infarction: Secondary | ICD-10-CM

## 2019-07-18 DIAGNOSIS — I10 Essential (primary) hypertension: Secondary | ICD-10-CM

## 2019-07-18 DIAGNOSIS — Z91199 Patient's noncompliance with other medical treatment and regimen due to unspecified reason: Secondary | ICD-10-CM

## 2019-07-18 DIAGNOSIS — Z9119 Patient's noncompliance with other medical treatment and regimen: Secondary | ICD-10-CM

## 2019-07-19 ENCOUNTER — Other Ambulatory Visit: Payer: Self-pay | Admitting: Cardiovascular Disease

## 2019-07-19 ENCOUNTER — Other Ambulatory Visit: Payer: Self-pay

## 2019-07-19 DIAGNOSIS — I25118 Atherosclerotic heart disease of native coronary artery with other forms of angina pectoris: Secondary | ICD-10-CM

## 2019-07-19 DIAGNOSIS — I214 Non-ST elevation (NSTEMI) myocardial infarction: Secondary | ICD-10-CM

## 2019-07-19 DIAGNOSIS — Z91199 Patient's noncompliance with other medical treatment and regimen due to unspecified reason: Secondary | ICD-10-CM

## 2019-07-19 DIAGNOSIS — I340 Nonrheumatic mitral (valve) insufficiency: Secondary | ICD-10-CM

## 2019-07-19 DIAGNOSIS — I10 Essential (primary) hypertension: Secondary | ICD-10-CM

## 2019-07-19 DIAGNOSIS — Z9119 Patient's noncompliance with other medical treatment and regimen: Secondary | ICD-10-CM

## 2019-07-19 MED ORDER — PHENOBARBITAL 32.4 MG PO TABS: 32.4000 mg | ORAL_TABLET | Freq: Four times a day (QID) | ORAL | 5 refills | Status: DC

## 2019-07-20 NOTE — Telephone Encounter (Signed)
Phenbarbital fax twice to pharmacy at [phone].

## 2019-07-21 ENCOUNTER — Other Ambulatory Visit: Payer: Self-pay | Admitting: Cardiovascular Disease

## 2019-07-21 DIAGNOSIS — I340 Nonrheumatic mitral (valve) insufficiency: Secondary | ICD-10-CM

## 2019-07-21 DIAGNOSIS — Z91199 Patient's noncompliance with other medical treatment and regimen due to unspecified reason: Secondary | ICD-10-CM

## 2019-07-21 DIAGNOSIS — I10 Essential (primary) hypertension: Secondary | ICD-10-CM

## 2019-07-21 DIAGNOSIS — I214 Non-ST elevation (NSTEMI) myocardial infarction: Secondary | ICD-10-CM

## 2019-07-21 DIAGNOSIS — I25118 Atherosclerotic heart disease of native coronary artery with other forms of angina pectoris: Secondary | ICD-10-CM

## 2019-07-21 DIAGNOSIS — Z9119 Patient's noncompliance with other medical treatment and regimen: Secondary | ICD-10-CM

## 2019-09-20 ENCOUNTER — Other Ambulatory Visit: Payer: Self-pay | Admitting: Physician Assistant

## 2019-09-21 ENCOUNTER — Other Ambulatory Visit: Payer: Self-pay | Admitting: Physician Assistant

## 2019-09-24 ENCOUNTER — Other Ambulatory Visit: Payer: Self-pay | Admitting: Physician Assistant

## 2019-09-24 DIAGNOSIS — Z91199 Patient's noncompliance with other medical treatment and regimen due to unspecified reason: Secondary | ICD-10-CM

## 2019-09-24 DIAGNOSIS — I340 Nonrheumatic mitral (valve) insufficiency: Secondary | ICD-10-CM

## 2019-09-24 DIAGNOSIS — I214 Non-ST elevation (NSTEMI) myocardial infarction: Secondary | ICD-10-CM

## 2019-09-24 DIAGNOSIS — I25118 Atherosclerotic heart disease of native coronary artery with other forms of angina pectoris: Secondary | ICD-10-CM

## 2019-09-24 DIAGNOSIS — I10 Essential (primary) hypertension: Secondary | ICD-10-CM

## 2019-09-24 DIAGNOSIS — Z9119 Patient's noncompliance with other medical treatment and regimen: Secondary | ICD-10-CM

## 2019-10-02 ENCOUNTER — Other Ambulatory Visit: Payer: Self-pay | Admitting: Cardiovascular Disease

## 2019-10-04 ENCOUNTER — Other Ambulatory Visit: Payer: Self-pay | Admitting: Cardiovascular Disease

## 2019-10-12 ENCOUNTER — Encounter: Payer: Self-pay | Admitting: Internal Medicine

## 2019-10-12 ENCOUNTER — Other Ambulatory Visit: Payer: Self-pay

## 2019-10-12 ENCOUNTER — Ambulatory Visit (INDEPENDENT_AMBULATORY_CARE_PROVIDER_SITE_OTHER): Payer: Medicare Other | Admitting: Internal Medicine

## 2019-10-12 VITALS — BP 132/76 | HR 80 | Temp 98.6°F | Resp 14 | Ht 74.0 in | Wt 237.0 lb

## 2019-10-12 DIAGNOSIS — I10 Essential (primary) hypertension: Secondary | ICD-10-CM | POA: Diagnosis not present

## 2019-10-12 DIAGNOSIS — R739 Hyperglycemia, unspecified: Secondary | ICD-10-CM

## 2019-10-12 DIAGNOSIS — E559 Vitamin D deficiency, unspecified: Secondary | ICD-10-CM

## 2019-10-12 DIAGNOSIS — E7800 Pure hypercholesterolemia, unspecified: Secondary | ICD-10-CM

## 2019-10-12 DIAGNOSIS — D638 Anemia in other chronic diseases classified elsewhere: Secondary | ICD-10-CM | POA: Diagnosis not present

## 2019-10-12 LAB — HEPATIC FUNCTION PANEL
ALT: 27 U/L (ref 0–53)
AST: 21 U/L (ref 0–37)
Albumin: 4.5 g/dL (ref 3.5–5.2)
Alkaline Phosphatase: 63 U/L (ref 39–117)
Bilirubin, Direct: 0.1 mg/dL (ref 0.0–0.3)
Total Bilirubin: 0.3 mg/dL (ref 0.2–1.2)
Total Protein: 7.9 g/dL (ref 6.0–8.3)

## 2019-10-12 LAB — BASIC METABOLIC PANEL
BUN: 14 mg/dL (ref 6–23)
CO2: 28 mEq/L (ref 19–32)
Calcium: 9.6 mg/dL (ref 8.4–10.5)
Chloride: 105 mEq/L (ref 96–112)
Creatinine, Ser: 1.16 mg/dL (ref 0.40–1.50)
GFR: 78.37 mL/min (ref >60.00)
Glucose, Bld: 87 mg/dL (ref 70–99)
Potassium: 4.2 mEq/L (ref 3.5–5.1)
Sodium: 138 mEq/L (ref 135–145)

## 2019-10-12 LAB — LIPID PANEL
Cholesterol: 131 mg/dL (ref 0–200)
HDL: 32.9 mg/dL — ABNORMAL LOW (ref >39.00)
LDL Cholesterol: 80 mg/dL (ref 0–99)
NonHDL: 97.67
Total CHOL/HDL Ratio: 4
Triglycerides: 89 mg/dL (ref 0.0–149.0)
VLDL: 17.8 mg/dL (ref 0.0–40.0)

## 2019-10-12 LAB — VITAMIN D 25 HYDROXY (VIT D DEFICIENCY, FRACTURES): VITD: 35 ng/mL (ref 30.00–100.00)

## 2019-10-12 LAB — HEMOGLOBIN A1C: Hgb A1c MFr Bld: 6.7 % — ABNORMAL HIGH (ref 4.6–6.5)

## 2019-10-12 NOTE — Progress Notes (Signed)
Subjective:    Patient ID: [name], male    DOB: [birth year], 58 y.o.   MRN: [id number]  HPI  Here to f/u; overall doing ok,  Pt denies chest pain, increasing sob or doe, wheezing, orthopnea, PND, increased LE swelling, palpitations, dizziness or syncope.  Pt denies new neurological symptoms such as new headache, or facial or extremity weakness or numbness.  Pt denies polydipsia, polyuria, or low sugar episode.  Pt states overall good compliance with meds, mostly trying to follow appropriate diet, with wt overall stable,  but little exercise however. Finished Vit d oral, asks for f/u lab.  Gained wt with too much good food recently per pt. Wt Readings from Last 3 Encounters:  10/12/19 237 lb (107.5 kg)  04/12/19 226 lb (102.5 kg)  03/18/19 225 lb (102.1 kg)   Past Medical History:  Diagnosis Date  . Anemia 01/01/2017  . CAD (coronary artery disease)    a. Overlapping BMS-prox LCx in 2009 b. NSTEMI 2012 due to prox LCx ISR s/p PTCA alone; EF 45% w/ basal inf HK. Had run out of Plavix 1 week prior. c. NSTEMI 05/2015 after running out of Plavix - cath with 3V CAD, Patient left AMA prior to agreeing to CABG. // d. LHC 8/16: LM 60; LAD mid 60, D1 75; RI 50, 80; LCx prox stent 100; RCA prox 100; EF 45-50 >> seen by [facility] - declined CABG >> Med Rx.  . Chronic combined systolic and diastolic CHF (congestive heart failure) ([place]) 06/06/2015   a - Echo 8/16:  EF 40-45, inf-lat AK, Gr 2 diastolic dysfunction, mild AI, mod MR // b - TEE 8/16: EF 45-50, inf HK, mod MR, mild TR // c - Echo 4/18: EF 40-45, ant-lat and inf-lat HK, Gr 2 DD, mild AI, mild to mod MR, severe LAE, PASP 29 // d - Echo 4/19: Mild concentric LVH, EF 40-45, anterolateral/inferolateral HK, mild AI, moderate MR, mild LAE, mild TR, PASP 25   . Clotting disorder ([place])   . Diabetes mellitus without complication ([place])   . HLD (hyperlipidemia)   . Hyperglycemia A1C 6.0 05/2015.  [name] Hypertension   . MI (myocardial infarction) ([place]) x 7  .  Poor compliance   . Seizure disorder ([place])   . Seizures ([place])   . TOBACCO ABUSE 02/22/2009  . Valvular heart disease    a. Echo 05/2015: mild AI, mod MR.   Past Surgical History:  Procedure Laterality Date  . CARDIAC CATHETERIZATION [name] 05/29/2015   Procedure: Left Heart Cath and Coronary Angiography;  Surgeon: Belva Crome, MD;  Location: [facility] LAB;  Service: Cardiovascular;  Laterality: [name];  . COLONOSCOPY    . CORONARY ANGIOPLASTY  03/2011   PTCA-prox LCx ISR  . CORONARY ANGIOPLASTY WITH STENT PLACEMENT  10/2007   BMS-prox LCx  . LEFT HEART CATHETERIZATION WITH CORONARY ANGIOGRAM [name] 10/13/2013   Procedure: LEFT HEART CATHETERIZATION WITH CORONARY ANGIOGRAM;  Surgeon: Blane Ohara, MD;  Location: [facility] CATH LAB;  Service: Cardiovascular;  Laterality: [name];  . TEE WITHOUT CARDIOVERSION [name] 06/16/2015   Procedure: TRANSESOPHAGEAL ECHOCARDIOGRAM (TEE);  Surgeon: Josue Hector, MD;  Location: [facility] ENDOSCOPY;  Service: Cardiovascular;  Laterality: [name];    reports that he has been smoking cigars. He has smoked for the past 35.00 years. He has never used smokeless tobacco. He reports that he does not drink alcohol or use drugs. family history includes Coronary artery disease in his maternal aunt; Heart attack in his maternal  uncle; Heart disease in his maternal uncle; Hypertension in an other family member; Stroke in his maternal uncle. No Known Allergies Current Outpatient Medications on File Prior to Visit  Medication Sig Dispense Refill  . AMBULATORY NON FORMULARY MEDICATION Medication Name: Black seed oil    . carvedilol (COREG) 3.125 MG tablet Take 1 tablet (3.125 mg total) by mouth 2 (two) times daily with a meal. Please make annual appt with Dr. Burt Knack for future refills. [phone]. 1st attempt. 60 tablet 0  . ferrous sulfate 325 (65 FE) MG tablet Take 1 tablet (325 mg total) by mouth daily with breakfast. 90 tablet 3  . Flaxseed, Linseed, (FLAX PO) Take by mouth.    .  levETIRAcetam (KEPPRA) 500 MG tablet Take 1 tablet (500 mg total) by mouth 2 (two) times daily. 180 tablet 4  . lisinopril (PRINIVIL,ZESTRIL) 2.5 MG tablet TAKE 1 TABLET BY MOUTH ONCE DAILY 90 tablet 3  . nitroGLYCERIN (NITROSTAT) 0.4 MG SL tablet place 1 tablet under the tongue if needed every 5 minutes for chest pain for 3 doses 25 tablet 3  . OVER THE COUNTER MEDICATION Take 1 drop by mouth daily. BLACKSEED OIL 1 DROP BY MOUTH ONCE DAILY    . PHENobarbital (LUMINAL) 32.4 MG tablet Take 1 tablet (32.4 mg total) by mouth 4 (four) times daily. 120 tablet 5  . vitamin B-12 (CYANOCOBALAMIN) 1000 MCG tablet Take 500 mcg by mouth 3 (three) times daily.     . Zinc 30 MG TABS Take 2 tablets by mouth daily.     [name] ezetimibe (ZETIA) 10 MG tablet Take 1 tablet (10 mg total) by mouth daily. 90 tablet 3  . fexofenadine (ALLEGRA) 180 MG tablet Take 1 tablet (180 mg total) by mouth daily. (Patient not taking: Reported on 10/12/2019) 30 tablet 2  . furosemide (LASIX) 40 MG tablet Take 1 tablet (40 mg total) by mouth daily as needed for edema. Take for weight gain (3 lbs or more in 1 day) or leg swelling (Patient not taking: Reported on 10/12/2019) 90 tablet 3  . triamcinolone (NASACORT) 55 MCG/ACT AERO nasal inhaler Place 2 sprays into the nose daily. (Patient not taking: Reported on 10/12/2019) 1 Inhaler 12   No current facility-administered medications on file prior to visit.   Review of Systems  Constitutional: Negative for other unusual diaphoresis or sweats HENT: Negative for ear discharge or swelling Eyes: Negative for other worsening visual disturbances Respiratory: Negative for stridor or other swelling  Gastrointestinal: Negative for worsening distension or other blood Genitourinary: Negative for retention or other urinary change Musculoskeletal: Negative for other MSK pain or swelling Skin: Negative for color change or other new lesions Neurological: Negative for worsening tremors and other  numbness  Psychiatric/Behavioral: Negative for worsening agitation or other fatigue All otherwise neg per pt     Objective:   Physical Exam BP 132/76   Pulse 80   Temp 98.6 F (37 C)   Resp 14   Ht 6\' 2"  (1.88 m)   Wt 237 lb (107.5 kg)   SpO2 99%   BMI 30.43 kg/m  VS noted,  Constitutional: Pt appears in NAD HENT: Head: NCAT.  Right Ear: External ear normal.  Left Ear: External ear normal.  Eyes: . Pupils are equal, round, and reactive to light. Conjunctivae and EOM are normal Nose: without d/c or deformity Neck: Neck supple. Gross normal ROM Cardiovascular: Normal rate and regular rhythm.   Pulmonary/Chest: Effort normal and breath sounds without rales or wheezing.  Abd:  Soft, NT, ND, + BS, no organomegaly Neurological: Pt is alert. At baseline orientation, motor grossly intact Skin: Skin is warm. No rashes, other new lesions, no LE edema Psychiatric: Pt behavior is normal without agitation  All otherwise neg per pt Lab Results  Component Value Date   WBC 7.0 04/12/2019   HGB 11.0 (L) 04/12/2019   HCT 32.9 (L) 04/12/2019   PLT 360.0 04/12/2019   GLUCOSE 87 10/12/2019   CHOL 131 10/12/2019   TRIG 89.0 10/12/2019   HDL 32.90 (L) 10/12/2019   LDLCALC 80 10/12/2019   ALT 27 10/12/2019   AST 21 10/12/2019   NA 138 10/12/2019   K 4.2 10/12/2019   CL 105 10/12/2019   CREATININE 1.16 10/12/2019   BUN 14 10/12/2019   CO2 28 10/12/2019   TSH 1.17 04/12/2019   PSA 1.19 04/12/2019   INR 1.01 05/27/2015   HGBA1C 6.7 (H) 10/12/2019       Assessment & Plan:

## 2019-10-12 NOTE — Patient Instructions (Signed)
Please continue all other medications as before, and refills have been done if requested.  Please have the pharmacy call with any other refills you may need.  Please continue your efforts at being more active, low cholesterol diet, and weight control.  Please keep your appointments with your specialists as you may have planned  Please go to the LAB at the blood drawing area for the tests to be done  You will be contacted by phone if any changes need to be made immediately.  Otherwise, you will receive a letter about your results with an explanation, but please check with MyChart first.  Please remember to sign up for MyChart if you have not done so, as this will be important to you in the future with finding out test results, communicating by private email, and scheduling acute appointments online when needed.  Please return in 6 months, or sooner if needed

## 2019-10-20 ENCOUNTER — Other Ambulatory Visit: Payer: Self-pay | Admitting: Cardiovascular Disease

## 2019-10-20 ENCOUNTER — Other Ambulatory Visit: Payer: Self-pay

## 2019-10-20 DIAGNOSIS — I25118 Atherosclerotic heart disease of native coronary artery with other forms of angina pectoris: Secondary | ICD-10-CM

## 2019-10-20 DIAGNOSIS — Z9119 Patient's noncompliance with other medical treatment and regimen: Secondary | ICD-10-CM

## 2019-10-20 DIAGNOSIS — Z91199 Patient's noncompliance with other medical treatment and regimen due to unspecified reason: Secondary | ICD-10-CM

## 2019-10-20 DIAGNOSIS — I10 Essential (primary) hypertension: Secondary | ICD-10-CM

## 2019-10-20 DIAGNOSIS — I340 Nonrheumatic mitral (valve) insufficiency: Secondary | ICD-10-CM

## 2019-10-20 DIAGNOSIS — I214 Non-ST elevation (NSTEMI) myocardial infarction: Secondary | ICD-10-CM

## 2019-10-20 MED ORDER — ROSUVASTATIN CALCIUM 40 MG PO TABS: 40.0000 mg | ORAL_TABLET | Freq: Every day | ORAL | 0 refills | Status: DC

## 2019-10-21 ENCOUNTER — Telehealth: Payer: Self-pay | Admitting: Physician Assistant

## 2019-10-21 ENCOUNTER — Other Ambulatory Visit: Payer: Self-pay

## 2019-10-21 ENCOUNTER — Other Ambulatory Visit: Payer: Self-pay | Admitting: Cardiovascular Disease

## 2019-10-21 DIAGNOSIS — Z9119 Patient's noncompliance with other medical treatment and regimen: Secondary | ICD-10-CM

## 2019-10-21 DIAGNOSIS — I25118 Atherosclerotic heart disease of native coronary artery with other forms of angina pectoris: Secondary | ICD-10-CM

## 2019-10-21 DIAGNOSIS — I340 Nonrheumatic mitral (valve) insufficiency: Secondary | ICD-10-CM

## 2019-10-21 DIAGNOSIS — Z91199 Patient's noncompliance with other medical treatment and regimen due to unspecified reason: Secondary | ICD-10-CM

## 2019-10-21 DIAGNOSIS — I214 Non-ST elevation (NSTEMI) myocardial infarction: Secondary | ICD-10-CM

## 2019-10-21 DIAGNOSIS — I10 Essential (primary) hypertension: Secondary | ICD-10-CM

## 2019-10-21 MED ORDER — ISOSORBIDE MONONITRATE ER 30 MG PO TB24: 15.0000 mg | ORAL_TABLET | Freq: Every day | ORAL | 0 refills | Status: DC

## 2019-10-21 NOTE — Telephone Encounter (Signed)
*  STAT* If patient is at the pharmacy, call can be transferred to refill team.   1. Which medications need to be refilled? (please list name of each medication and dose if known)  Isosorbide  2. Which pharmacy/location (including street and city if local pharmacy) is medication to be sent to?*- [facility], [place]  3. Do they need a 30 day or 90 day supply? 90 days and refills- pt have an  appt on 10-26-19

## 2019-10-22 ENCOUNTER — Encounter: Payer: Self-pay | Admitting: Internal Medicine

## 2019-10-22 NOTE — Assessment & Plan Note (Signed)
stable overall by history and exam, recent data reviewed with pt, and pt to continue medical treatment as before,  to f/u any worsening symptoms or concerns  

## 2019-10-22 NOTE — Assessment & Plan Note (Signed)
For f/u lab, cont oral replacement 

## 2019-10-22 NOTE — Assessment & Plan Note (Signed)
For fu lab,  to f/u any worsening symptoms or concerns  

## 2019-10-31 ENCOUNTER — Other Ambulatory Visit: Payer: Self-pay | Admitting: Cardiovascular Disease

## 2019-11-01 ENCOUNTER — Other Ambulatory Visit: Payer: Self-pay | Admitting: Cardiovascular Disease

## 2019-11-01 NOTE — Progress Notes (Signed)
Virtual Visit via Telephone Note   This visit type was conducted due to national recommendations for restrictions regarding the COVID-19 Pandemic (e.g. social distancing) in an effort to limit this patient's exposure and mitigate transmission in our community.  Due to his co-morbid illnesses, this patient is at least at moderate risk for complications without adequate follow up.  This format is felt to be most appropriate for this patient at this time.  The patient did not have access to video technology/had technical difficulties with video requiring transitioning to audio format only (telephone).  All issues noted in this document were discussed and addressed.  No physical exam could be performed with this format.  Please refer to the patient's chart for his  consent to telehealth for [facility].   Date:  11/02/2019   ID:  [name], DOB [birth year], MRN [id number]  Patient Location: Home Provider Location: Office  PCP:  Biagio Borg, MD  Cardiologist:  Sherren Mocha, MD  Evaluation Performed:  Follow-Up Visit  Chief Complaint:  Yearly follow up  History of Present Illness:    [name] is a 59 y.o. male with hx of CAD status post BMS x 2 to the prox CFX in 2009, non-STEMI 2012 due to ISR of CFX stents (off Plavix) treated with angioplasty, ischemic cardiomyopathy, HTN, HL, seizure disorder, reported "clotting disorder," tobacco abuse and noncompliance presents for follow up.   Admitted in 2016 with NSTEMI and LHC demonstrated 3V CAD with total occlusion of the RCA and LCx and mod stenosis of the LM and LAD. He also has mod MR. He was seen by TCTS and he declined CABG. Follow-up echo in 4/19 demonstrated EF 40-45 and moderate mitral regurgitation.  Last seen by APP 08/2018.  Seen virtually for follow up. No regular exercise but tries to remain active. Takes his medications. The patient denies nausea, vomiting, fever, chest pain, palpitations, shortness of breath,  orthopnea, PND, dizziness, syncope, cough, congestion, abdominal pain, hematochezia, melena, lower extremity edema. Personally reviewed most recent lab work done by PCP on 10/12/19.   The patient does not have symptoms concerning for COVID-19 infection (fever, chills, cough, or new shortness of breath).    Past Medical History:  Diagnosis Date  . Anemia 01/01/2017  . CAD (coronary artery disease)    a. Overlapping BMS-prox LCx in 2009 b. NSTEMI 2012 due to prox LCx ISR s/p PTCA alone; EF 45% w/ basal inf HK. Had run out of Plavix 1 week prior. c. NSTEMI 05/2015 after running out of Plavix - cath with 3V CAD, Patient left AMA prior to agreeing to CABG. // d. LHC 8/16: LM 60; LAD mid 60, D1 75; RI 50, 80; LCx prox stent 100; RCA prox 100; EF 45-50 >> seen by TCTS - declined CABG >> Med Rx.  . Chronic combined systolic and diastolic CHF (congestive heart failure) ([place]) 06/06/2015   a - Echo 8/16:  EF 40-45, inf-lat AK, Gr 2 diastolic dysfunction, mild AI, mod MR // b - TEE 8/16: EF 45-50, inf HK, mod MR, mild TR // c - Echo 4/18: EF 40-45, ant-lat and inf-lat HK, Gr 2 DD, mild AI, mild to mod MR, severe LAE, PASP 29 // d - Echo 4/19: Mild concentric LVH, EF 40-45, anterolateral/inferolateral HK, mild AI, moderate MR, mild LAE, mild TR, PASP 25   . Clotting disorder ([place])   . Diabetes mellitus without complication ([place])   . HLD (hyperlipidemia)   . Hyperglycemia A1C 6.0 05/2015.  [name]  Hypertension   . MI (myocardial infarction) ([place]) x 7  . Poor compliance   . Seizure disorder ([place])   . Seizures ([place])   . TOBACCO ABUSE 02/22/2009  . Valvular heart disease    a. Echo 05/2015: mild AI, mod MR.   Past Surgical History:  Procedure Laterality Date  . CARDIAC CATHETERIZATION [name] 05/29/2015   Procedure: Left Heart Cath and Coronary Angiography;  Surgeon: Belva Crome, MD;  Location: [facility];  Service: Cardiovascular;  Laterality: [name];  . COLONOSCOPY    . CORONARY ANGIOPLASTY  03/2011    PTCA-prox LCx ISR  . CORONARY ANGIOPLASTY WITH STENT PLACEMENT  10/2007   BMS-prox LCx  . LEFT HEART CATHETERIZATION WITH CORONARY ANGIOGRAM [name] 10/13/2013   Procedure: LEFT HEART CATHETERIZATION WITH CORONARY ANGIOGRAM;  Surgeon: Blane Ohara, MD;  Location: [facility] CATH LAB;  Service: Cardiovascular;  Laterality: [name];  . TEE WITHOUT CARDIOVERSION [name] 06/16/2015   Procedure: TRANSESOPHAGEAL ECHOCARDIOGRAM (TEE);  Surgeon: Josue Hector, MD;  Location: [facility] ENDOSCOPY;  Service: Cardiovascular;  Laterality: [name];    Prior to Admission medications   Medication Sig Start Date End Date Taking? Authorizing Provider  AMBULATORY NON FORMULARY MEDICATION Medication Name: Black seed oil   Yes [provider]  carvedilol (COREG) 3.125 MG tablet Take 1 tablet (3.125 mg total) by mouth 2 (two) times daily with a meal. t. 11/02/19  Yes Konner Saiz, PA  clopidogrel (PLAVIX) 75 MG tablet Take 1 tablet (75 mg total) by mouth daily. . 11/02/19  Yes Lyncoln Ledgerwood, PA  ferrous sulfate 325 (65 FE) MG tablet Take 1 tablet (325 mg total) by mouth daily with breakfast. 01/16/17  Yes Weaver, Scott T, PA-C  Flaxseed, Linseed, (FLAX PO) Take by mouth.   Yes [provider]  furosemide (LASIX) 40 MG tablet Take 1 tablet (40 mg total) by mouth daily as needed for edema. Take for weight gain (3 lbs or more in 1 day) or leg swelling 03/17/18  Yes Weaver, Scott T, PA-C  isosorbide mononitrate (IMDUR) 30 MG 24 hr tablet TAKE 1/2 TABLET(15 MG) BY MOUTH DAILY 11/02/19  Yes Tamarah Bhullar, PA  levETIRAcetam (KEPPRA) 500 MG tablet Take 1 tablet (500 mg total) by mouth 2 (two) times daily. 02/08/19  Yes Garvin Fila, MD  lisinopril (ZESTRIL) 2.5 MG tablet Take 1 tablet (2.5 mg total) by mouth daily. 11/02/19  Yes Ariba Lehnen, PA  nitroGLYCERIN (NITROSTAT) 0.4 MG SL tablet place 1 tablet under the tongue if needed every 5 minutes for chest pain for 3 doses 08/04/17  Yes Simmons, Brittainy M, PA-C    OVER THE COUNTER MEDICATION Take 1 drop by mouth daily. BLACKSEED OIL 1 DROP BY MOUTH ONCE DAILY   Yes [provider]  PHENobarbital (LUMINAL) 32.4 MG tablet Take 1 tablet (32.4 mg total) by mouth 4 (four) times daily. 07/19/19  Yes Garvin Fila, MD  rosuvastatin (CRESTOR) 40 MG tablet Take 1 tablet (40 mg total) by mouth daily. 11/02/19  Yes Zhavia Cunanan, PA  vitamin B-12 (CYANOCOBALAMIN) 1000 MCG tablet Take 500 mcg by mouth 3 (three) times daily.    Yes [provider]  Zinc 30 MG TABS Take 2 tablets by mouth daily.    Yes [provider]  ezetimibe (ZETIA) 10 MG tablet Take 1 tablet (10 mg total) by mouth daily. 11/02/19 01/31/20  [name], PA     Allergies:   Patient has no known allergies.   Social History   Tobacco  Use  . Smoking status: Current Some Day Smoker    Years: 35.00    Types: Cigars  . Smokeless tobacco: Never Used  . Tobacco comment: smoke cigars 2 per day  Substance Use Topics  . Alcohol use: No  . Drug use: No     Family Hx: The patient's family history includes Coronary artery disease in his maternal aunt; Heart attack in his maternal uncle; Heart disease in his maternal uncle; Hypertension in an other family member; Stroke in his maternal uncle. There is no history of Colon cancer, Stomach cancer, Rectal cancer, or Pancreatic cancer.  ROS:   Please see the history of present illness.    All other systems reviewed and are negative.   Prior CV studies:   The following studies were reviewed today:  Echo 01/2018 Left ventricle: The cavity size was severely dilated. There was   mild concentric hypertrophy. Systolic function was mildly to   moderately reduced. The estimated ejection fraction was in the   range of 40% to 45%. Hypokinesis of the anterolateral and   inferolateral myocardium. Left ventricular diastolic function   parameters were normal. - Aortic valve: There was mild regurgitation. - Mitral valve:  Transvalvular velocity was within the normal range.   There was no evidence for stenosis. There was moderate   regurgitation. - Left atrium: The atrium was mildly dilated. - Right ventricle: The cavity size was normal. Wall thickness was   normal. Systolic function was normal. - Atrial septum: No defect or patent foramen ovale was identified. - Tricuspid valve: There was mild regurgitation. - Pulmonary arteries: Systolic pressure was within the normal   range. PA peak pressure: 25 mm Hg (S).   Left Heart Cath and Coronary Angiography  05/2015  Conclusion  1. Prox Cx to Mid Cx lesion, 100% stenosed. The lesion was previously treated with a stent (unknown type) . 2. Ramus-1 lesion, 50% stenosed. 3. Ramus-2 lesion, 80% stenosed. 4. 1st Diag lesion, 75% stenosed. 5. Mid LAD lesion, 60% stenosed. 6. LM lesion, 60% stenosed. 7. Prox RCA to Mid RCA lesion, 100% stenosed. 8. Mid RCA lesion, 100% stenosed.    Moderate distal left main (50-60% depending upon views), moderate mid LAD, moderately severe first diagonal, severe obstruction of the ramus intermedius/first obtuse marginal, and total occlusion of the RCA. The previously placed stent in the proximal to mid circumflex is totally occluded. The distal obtuse marginals fill by left to right and right to right collaterals.  Moderate inferior wall hypokinesis. EF 50%   Recommendations:   Hold Plavix  TCTS consultation for consideration of bypass surgery including SVG to RCA, SVG to distal circumflex, SVG to ramus intermedius, SVG to diagonal, and LIMA to LAD. Should it be determined of bypass surgery is not indicated or not possible, consider stenting of the ramus intermedius with continued medical therapy (This would be my second choice rather than the primary treatment option for this relatively young patient). I believe that bypass surgery would be a more durable treatment option given his social issues and comorbidities.       Labs/Other Tests and Data Reviewed:    EKG:  No ECG reviewed.  Recent Labs: 04/12/2019: Hemoglobin 11.0; Platelets 360.0; TSH 1.17 10/12/2019: ALT 27; BUN 14; Creatinine, Ser 1.16; Potassium 4.2; Sodium 138   Recent Lipid Panel Lab Results  Component Value Date/Time   CHOL 131 10/12/2019 04:01 PM   CHOL 135 12/31/2018 04:14 PM   TRIG 89.0 10/12/2019 04:01 PM   HDL  32.90 (L) 10/12/2019 04:01 PM   HDL 31 (L) 12/31/2018 04:14 PM   CHOLHDL 4 10/12/2019 04:01 PM   LDLCALC 80 10/12/2019 04:01 PM   LDLCALC 93 12/31/2018 04:14 PM    Wt Readings from Last 3 Encounters:  11/02/19 237 lb (107.5 kg)  10/12/19 237 lb (107.5 kg)  04/12/19 226 lb (102.5 kg)     Objective:    Vital Signs:  Ht 6\' 2"  (1.88 m)   Wt 237 lb (107.5 kg)   BMI 30.43 kg/m    VITAL SIGNS:  reviewed GEN:  no acute distress NEURO:  alert and oriented x 3, no obvious focal deficit  ASSESSMENT & PLAN:    1. Chronic combined CHF - No CHF symptoms. Renal function stable. Continue Coreg and lisinopril. On lasix.   2. CAD - No angina. No change in therapy.   3. Moderate MR - by last echo. No change in symptoms. Will update echo next time given COVID.   4. HTN - Does no have BP cuff. BP was 132/76 during office visit with PCP 10/12/19. Continue current therapy.   5. HLD - 10/12/2019: Cholesterol 131; HDL 32.90; LDL Cholesterol 80; Triglycerides 89.0; VLDL 17.8  - Continue statin   COVID-19 Education: The signs and symptoms of COVID-19 were discussed with the patient and how to seek care for testing (follow up with PCP or arrange E-visit).  The importance of social distancing was discussed today.  Time:   Today, I have spent 7 minutes with the patient with telehealth technology discussing the above problems.     Medication Adjustments/Labs and Tests Ordered: Current medicines are reviewed at length with the patient today.  Concerns regarding medicines are outlined above.   Tests Ordered: No  orders of the defined types were placed in this encounter.   Medication Changes: Meds ordered this encounter  Medications  . isosorbide mononitrate (IMDUR) 30 MG 24 hr tablet    Sig: TAKE 1/2 TABLET(15 MG) BY MOUTH DAILY    Dispense:  45 tablet    Refill:  3    **Patient requests 90 days supply**  . clopidogrel (PLAVIX) 75 MG tablet    Sig: Take 1 tablet (75 mg total) by mouth daily. .    Dispense:  90 tablet    Refill:  3  . carvedilol (COREG) 3.125 MG tablet    Sig: Take 1 tablet (3.125 mg total) by mouth 2 (two) times daily with a meal. t.    Dispense:  180 tablet    Refill:  3  . ezetimibe (ZETIA) 10 MG tablet    Sig: Take 1 tablet (10 mg total) by mouth daily.    Dispense:  90 tablet    Refill:  3  . lisinopril (ZESTRIL) 2.5 MG tablet    Sig: Take 1 tablet (2.5 mg total) by mouth daily.    Dispense:  90 tablet    Refill:  3  . rosuvastatin (CRESTOR) 40 MG tablet    Sig: Take 1 tablet (40 mg total) by mouth daily.    Dispense:  90 tablet    Refill:  3    Follow Up:  Either In Person or Virtual in 1 year(s)  Signed, [name], PA  11/02/2019 2:25 PM    [place]

## 2019-11-02 ENCOUNTER — Encounter: Payer: Self-pay | Admitting: Physician Assistant

## 2019-11-02 ENCOUNTER — Telehealth (INDEPENDENT_AMBULATORY_CARE_PROVIDER_SITE_OTHER): Payer: Medicare Other | Admitting: Physician Assistant

## 2019-11-02 ENCOUNTER — Other Ambulatory Visit: Payer: Self-pay

## 2019-11-02 VITALS — Ht 74.0 in | Wt 237.0 lb

## 2019-11-02 DIAGNOSIS — I214 Non-ST elevation (NSTEMI) myocardial infarction: Secondary | ICD-10-CM

## 2019-11-02 DIAGNOSIS — I25118 Atherosclerotic heart disease of native coronary artery with other forms of angina pectoris: Secondary | ICD-10-CM | POA: Diagnosis not present

## 2019-11-02 DIAGNOSIS — Z91199 Patient's noncompliance with other medical treatment and regimen due to unspecified reason: Secondary | ICD-10-CM

## 2019-11-02 DIAGNOSIS — Z9119 Patient's noncompliance with other medical treatment and regimen: Secondary | ICD-10-CM

## 2019-11-02 DIAGNOSIS — I340 Nonrheumatic mitral (valve) insufficiency: Secondary | ICD-10-CM

## 2019-11-02 DIAGNOSIS — I10 Essential (primary) hypertension: Secondary | ICD-10-CM

## 2019-11-02 MED ORDER — LISINOPRIL 2.5 MG PO TABS: 2.5000 mg | ORAL_TABLET | Freq: Every day | ORAL | 3 refills | Status: DC

## 2019-11-02 MED ORDER — ROSUVASTATIN CALCIUM 40 MG PO TABS: 40.0000 mg | ORAL_TABLET | Freq: Every day | ORAL | 3 refills | Status: DC

## 2019-11-02 MED ORDER — EZETIMIBE 10 MG PO TABS: 10.0000 mg | ORAL_TABLET | Freq: Every day | ORAL | 3 refills | Status: DC

## 2019-11-02 MED ORDER — CLOPIDOGREL BISULFATE 75 MG PO TABS: 75.0000 mg | ORAL_TABLET | Freq: Every day | ORAL | 3 refills | Status: DC

## 2019-11-02 MED ORDER — CARVEDILOL 3.125 MG PO TABS: 3.1250 mg | ORAL_TABLET | Freq: Two times a day (BID) | ORAL | 3 refills | Status: DC

## 2019-11-02 MED ORDER — ISOSORBIDE MONONITRATE ER 30 MG PO TB24: ORAL_TABLET | ORAL | 3 refills | Status: DC

## 2019-11-02 NOTE — Patient Instructions (Signed)
Medication Instructions:  Your physician recommends that you continue on your current medications as directed. Please refer to the Current Medication list given to you today.  *If you need a refill on your cardiac medications before your next appointment, please call your pharmacy*  Lab Work: None ordered  If you have labs (blood work) drawn today and your tests are completely normal, you will receive your results only by: [name] MyChart Message (if you have MyChart) OR . A paper copy in the mail If you have any lab test that is abnormal or we need to change your treatment, we will call you to review the results.  Testing/Procedures: None ordered  Follow-Up: At [facility], you and your health needs are our priority.  As part of our continuing mission to provide you with exceptional heart care, we have created designated Provider Care Teams.  These Care Teams include your primary Cardiologist (physician) and Advanced Practice Providers (APPs -  Physician Assistants and Nurse Practitioners) who all work together to provide you with the care you need, when you need it.  Your next appointment:   12 month(s)  The format for your next appointment:   In Person  Provider:   You may see Sherren Mocha, MD or one of the following Advanced Practice Providers on your designated Care Team:    Richardson Dopp, PA-C  [place], [place]  Daune Perch, NP   Other Instructions Your medication refills have been sent to your pharmacy

## 2019-11-17 ENCOUNTER — Other Ambulatory Visit: Payer: Self-pay | Admitting: Cardiovascular Disease

## 2019-11-17 DIAGNOSIS — I340 Nonrheumatic mitral (valve) insufficiency: Secondary | ICD-10-CM

## 2019-11-17 DIAGNOSIS — I214 Non-ST elevation (NSTEMI) myocardial infarction: Secondary | ICD-10-CM

## 2019-11-17 DIAGNOSIS — I25118 Atherosclerotic heart disease of native coronary artery with other forms of angina pectoris: Secondary | ICD-10-CM

## 2019-11-17 DIAGNOSIS — Z9119 Patient's noncompliance with other medical treatment and regimen: Secondary | ICD-10-CM

## 2019-11-17 DIAGNOSIS — Z91199 Patient's noncompliance with other medical treatment and regimen due to unspecified reason: Secondary | ICD-10-CM

## 2019-11-17 DIAGNOSIS — I10 Essential (primary) hypertension: Secondary | ICD-10-CM

## 2019-12-17 ENCOUNTER — Other Ambulatory Visit: Payer: Self-pay | Admitting: Physician Assistant

## 2019-12-29 ENCOUNTER — Telehealth: Payer: Self-pay | Admitting: Internal Medicine

## 2019-12-29 NOTE — Telephone Encounter (Signed)
New Message:   [name] is calling and states the patient had a prescreening A1C of 6.1 and just wanted to let us know.

## 2019-12-30 NOTE — Telephone Encounter (Signed)
Do you want the A1c result abstracted?

## 2020-01-18 ENCOUNTER — Other Ambulatory Visit: Payer: Self-pay | Admitting: Neurology

## 2020-02-10 ENCOUNTER — Ambulatory Visit (INDEPENDENT_AMBULATORY_CARE_PROVIDER_SITE_OTHER): Payer: Medicare Other | Admitting: Neurology

## 2020-02-10 ENCOUNTER — Encounter: Payer: Self-pay | Admitting: Neurology

## 2020-02-10 ENCOUNTER — Other Ambulatory Visit: Payer: Self-pay

## 2020-02-10 VITALS — BP 140/73 | HR 85 | Temp 97.6°F | Ht 74.0 in | Wt 227.0 lb

## 2020-02-10 DIAGNOSIS — I5042 Chronic combined systolic (congestive) and diastolic (congestive) heart failure: Secondary | ICD-10-CM | POA: Diagnosis not present

## 2020-02-10 DIAGNOSIS — G40909 Epilepsy, unspecified, not intractable, without status epilepticus: Secondary | ICD-10-CM

## 2020-02-10 DIAGNOSIS — I25119 Atherosclerotic heart disease of native coronary artery with unspecified angina pectoris: Secondary | ICD-10-CM

## 2020-02-10 MED ORDER — LEVETIRACETAM 500 MG PO TABS: 500.0000 mg | ORAL_TABLET | Freq: Two times a day (BID) | ORAL | 4 refills | Status: DC

## 2020-02-10 MED ORDER — PHENOBARBITAL 32.4 MG PO TABS: 32.4000 mg | ORAL_TABLET | Freq: Four times a day (QID) | ORAL | 4 refills | Status: DC
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
## 2020-02-10 NOTE — Patient Instructions (Signed)
Continue Keppra 500 twice daily and phenobarbital 32.4 mg 1 tablet 4 times daily.  Patient was counseled to be compliant with his medications and to avoid seizure provoking stimuli like sleep deprivation, medication noncompliance and alcohol.  He was given refill for a year for both medications.  He was advised to return for follow-up in 1 year.

## 2020-04-11 ENCOUNTER — Other Ambulatory Visit: Payer: Self-pay

## 2020-04-11 ENCOUNTER — Encounter: Payer: Self-pay | Admitting: Internal Medicine

## 2020-04-11 ENCOUNTER — Other Ambulatory Visit (INDEPENDENT_AMBULATORY_CARE_PROVIDER_SITE_OTHER): Payer: Medicare Other

## 2020-04-11 ENCOUNTER — Ambulatory Visit (INDEPENDENT_AMBULATORY_CARE_PROVIDER_SITE_OTHER): Payer: Medicare Other | Admitting: Internal Medicine

## 2020-04-11 VITALS — BP 122/68 | HR 70 | Temp 98.6°F | Ht 74.0 in | Wt 220.0 lb

## 2020-04-11 DIAGNOSIS — E559 Vitamin D deficiency, unspecified: Secondary | ICD-10-CM

## 2020-04-11 DIAGNOSIS — R739 Hyperglycemia, unspecified: Secondary | ICD-10-CM | POA: Diagnosis not present

## 2020-04-11 DIAGNOSIS — E538 Deficiency of other specified B group vitamins: Secondary | ICD-10-CM

## 2020-04-11 DIAGNOSIS — Z0000 Encounter for general adult medical examination without abnormal findings: Secondary | ICD-10-CM

## 2020-04-11 LAB — HEPATIC FUNCTION PANEL
ALT: 22 U/L (ref 0–53)
AST: 19 U/L (ref 0–37)
Albumin: 4.6 g/dL (ref 3.5–5.2)
Alkaline Phosphatase: 82 U/L (ref 39–117)
Bilirubin, Direct: 0.1 mg/dL (ref 0.0–0.3)
Total Bilirubin: 0.2 mg/dL (ref 0.2–1.2)
Total Protein: 7.8 g/dL (ref 6.0–8.3)

## 2020-04-11 LAB — LIPID PANEL
Cholesterol: 117 mg/dL (ref 0–200)
HDL: 29 mg/dL — ABNORMAL LOW (ref >39.00)
LDL Cholesterol: 75 mg/dL (ref 0–99)
NonHDL: 88.3
Total CHOL/HDL Ratio: 4
Triglycerides: 69 mg/dL (ref 0.0–149.0)
VLDL: 13.8 mg/dL (ref 0.0–40.0)

## 2020-04-11 LAB — CBC WITH DIFFERENTIAL/PLATELET
Basophils Absolute: 0 10*3/uL (ref 0.0–0.1)
Basophils Relative: 0.7 % (ref 0.0–3.0)
Eosinophils Absolute: 0.3 10*3/uL (ref 0.0–0.7)
Eosinophils Relative: 4.2 % (ref 0.0–5.0)
HCT: 34.6 % — ABNORMAL LOW (ref 39.0–52.0)
Hemoglobin: 11.9 g/dL — ABNORMAL LOW (ref 13.0–17.0)
Lymphocytes Relative: 33.4 % (ref 12.0–46.0)
Lymphs Abs: 2.1 10*3/uL (ref 0.7–4.0)
MCHC: 34.3 g/dL (ref 30.0–36.0)
MCV: 87.8 fl (ref 78.0–100.0)
Monocytes Absolute: 0.7 10*3/uL (ref 0.1–1.0)
Monocytes Relative: 11.7 % (ref 3.0–12.0)
Neutro Abs: 3.1 10*3/uL (ref 1.4–7.7)
Neutrophils Relative %: 50 % (ref 43.0–77.0)
Platelets: 349 10*3/uL (ref 150.0–400.0)
RBC: 3.94 Mil/uL — ABNORMAL LOW (ref 4.22–5.81)
RDW: 16.5 % — ABNORMAL HIGH (ref 11.5–15.5)
WBC: 6.2 10*3/uL (ref 4.0–10.5)

## 2020-04-11 LAB — MICROALBUMIN / CREATININE URINE RATIO
Creatinine,U: 238.9 mg/dL
Microalb Creat Ratio: 0.3 mg/g (ref 0.0–30.0)
Microalb, Ur: 0.8 mg/dL (ref 0.0–1.9)

## 2020-04-11 LAB — BASIC METABOLIC PANEL
BUN: 13 mg/dL (ref 6–23)
CO2: 31 mEq/L (ref 19–32)
Calcium: 9.5 mg/dL (ref 8.4–10.5)
Chloride: 102 mEq/L (ref 96–112)
Creatinine, Ser: 1.13 mg/dL (ref 0.40–1.50)
GFR: 80.63 mL/min (ref >60.00)
Glucose, Bld: 102 mg/dL — ABNORMAL HIGH (ref 70–99)
Potassium: 4.5 mEq/L (ref 3.5–5.1)
Sodium: 139 mEq/L (ref 135–145)

## 2020-04-11 LAB — HEMOGLOBIN A1C: Hgb A1c MFr Bld: 6.5 % (ref 4.6–6.5)

## 2020-04-11 NOTE — Patient Instructions (Signed)
Please continue all other medications as before, and refills have been done if requested.  Please have the pharmacy call with any other refills you may need.  Please continue your efforts at being more active, low cholesterol diet, and weight control.  You are otherwise up to date with prevention measures today.  Please keep your appointments with your specialists as you may have planned  Please go to the LAB at the blood drawing area for the tests to be done - at the [facility] lab  You will be contacted by phone if any changes need to be made immediately.  Otherwise, you will receive a letter about your results with an explanation, but please check with MyChart first.  Please remember to sign up for MyChart if you have not done so, as this will be important to you in the future with finding out test results, communicating by private email, and scheduling acute appointments online when needed.  Please make an Appointment to return in 6 months, or sooner if needed 

## 2020-04-11 NOTE — Assessment & Plan Note (Signed)
stable overall by history and exam, recent data reviewed with pt, and pt to continue medical treatment as before,  to f/u any worsening symptoms or concerns  

## 2020-04-11 NOTE — Progress Notes (Signed)
Subjective:    Patient ID: [name], male    DOB: 06-02-1961, 59 y.o.   MRN: [id number]  HPI  Here for wellness and f/u;  Overall doing ok;  Pt denies Chest pain, worsening SOB, DOE, wheezing, orthopnea, PND, worsening LE edema, palpitations, dizziness or syncope.  Pt denies neurological change such as new headache, facial or extremity weakness.  Pt denies polydipsia, polyuria, or low sugar symptoms. Pt states overall good compliance with treatment and medications, good tolerability, and has been trying to follow appropriate diet.  Pt denies worsening depressive symptoms, suicidal ideation or panic. No fever, night sweats, wt loss, loss of appetite, or other constitutional symptoms.  Pt states good ability with ADL's, has low fall risk, home safety reviewed and adequate, no other significant changes in hearing or vision, and only occasionally active with exercise. Not ready to quit smoking cigars. Lost wt with better diet.   Wt Readings from Last 3 Encounters:  04/11/20 220 lb (99.8 kg)  02/10/20 227 lb (103 kg)  11/02/19 237 lb (107.5 kg)   Past Medical History:  Diagnosis Date  . Anemia 01/01/2017  . CAD (coronary artery disease)    a. Overlapping BMS-prox LCx in 2009 b. NSTEMI 2012 due to prox LCx ISR s/p PTCA alone; EF 45% w/ basal inf HK. Had run out of Plavix 1 week prior. c. NSTEMI 05/2015 after running out of Plavix - cath with 3V CAD, Patient left AMA prior to agreeing to CABG. // d. LHC 8/16: LM 60; LAD mid 60, D1 75; RI 50, 80; LCx prox stent 100; RCA prox 100; EF 45-50 >> seen by TCTS - declined CABG >> Med Rx.  . Chronic combined systolic and diastolic CHF (congestive heart failure) ([place]) 06/06/2015   a - Echo 8/16:  EF 40-45, inf-lat AK, Gr 2 diastolic dysfunction, mild AI, mod MR // b - TEE 8/16: EF 45-50, inf HK, mod MR, mild TR // c - Echo 4/18: EF 40-45, ant-lat and inf-lat HK, Gr 2 DD, mild AI, mild to mod MR, severe LAE, PASP 29 // d - Echo 4/19: Mild concentric LVH, EF  40-45, anterolateral/inferolateral HK, mild AI, moderate MR, mild LAE, mild TR, PASP 25   . Clotting disorder ([place])   . Diabetes mellitus without complication ([place])   . HLD (hyperlipidemia)   . Hyperglycemia A1C 6.0 05/2015.  [name] Hypertension   . MI (myocardial infarction) ([place]) x 7  . Poor compliance   . Seizure disorder ([place])   . Seizures ([place])   . TOBACCO ABUSE 02/22/2009  . Valvular heart disease    a. Echo 05/2015: mild AI, mod MR.   Past Surgical History:  Procedure Laterality Date  . CARDIAC CATHETERIZATION [name] 05/29/2015   Procedure: Left Heart Cath and Coronary Angiography;  Surgeon: Belva Crome, MD;  Location: [facility];  Service: Cardiovascular;  Laterality: [name];  . COLONOSCOPY    . CORONARY ANGIOPLASTY  03/2011   PTCA-prox LCx ISR  . CORONARY ANGIOPLASTY WITH STENT PLACEMENT  10/2007   BMS-prox LCx  . LEFT HEART CATHETERIZATION WITH CORONARY ANGIOGRAM [name] 10/13/2013   Procedure: LEFT HEART CATHETERIZATION WITH CORONARY ANGIOGRAM;  Surgeon: Blane Ohara, MD;  Location: [facility] CATH LAB;  Service: Cardiovascular;  Laterality: [name];  . TEE WITHOUT CARDIOVERSION [name] 06/16/2015   Procedure: TRANSESOPHAGEAL ECHOCARDIOGRAM (TEE);  Surgeon: Josue Hector, MD;  Location: [facility] ENDOSCOPY;  Service: Cardiovascular;  Laterality: [name];    reports that he has been smoking  cigars. He has smoked for the past 35.00 years. He has never used smokeless tobacco. He reports that he does not drink alcohol and does not use drugs. family history includes Coronary artery disease in his maternal aunt; Heart attack in his maternal uncle; Heart disease in his maternal uncle; Hypertension in an other family member; Stroke in his maternal uncle. No Known Allergies Current Outpatient Medications on File Prior to Visit  Medication Sig Dispense Refill  . AMBULATORY NON FORMULARY MEDICATION Medication Name: Black seed oil    . carvedilol (COREG) 3.125 MG tablet Take 1 tablet (3.125 mg total) by mouth 2 (two)  times daily with a meal. t. 180 tablet 3  . clopidogrel (PLAVIX) 75 MG tablet TAKE 1 TABLET(75 MG) BY MOUTH DAILY 90 tablet 3  . ezetimibe (ZETIA) 10 MG tablet TAKE 1 TABLET(10 MG) BY MOUTH DAILY 90 tablet 3  . ferrous sulfate 325 (65 FE) MG tablet Take 1 tablet (325 mg total) by mouth daily with breakfast. 90 tablet 3  . Flaxseed, Linseed, (FLAX PO) Take by mouth.    . furosemide (LASIX) 40 MG tablet Take 1 tablet (40 mg total) by mouth daily as needed for edema. Take for weight gain (3 lbs or more in 1 day) or leg swelling 90 tablet 3  . isosorbide mononitrate (IMDUR) 30 MG 24 hr tablet TAKE 1/2 TABLET(15 MG) BY MOUTH DAILY 45 tablet 3  . levETIRAcetam (KEPPRA) 500 MG tablet Take 1 tablet (500 mg total) by mouth 2 (two) times daily. 180 tablet 4  . lisinopril (ZESTRIL) 2.5 MG tablet Take 1 tablet (2.5 mg total) by mouth daily. 90 tablet 3  . nitroGLYCERIN (NITROSTAT) 0.4 MG SL tablet place 1 tablet under the tongue if needed every 5 minutes for chest pain for 3 doses 25 tablet 3  . OVER THE COUNTER MEDICATION Take 1 drop by mouth daily. BLACKSEED OIL 1 DROP BY MOUTH ONCE DAILY    . PHENobarbital (LUMINAL) 32.4 MG tablet Take 1 tablet (32.4 mg total) by mouth 4 (four) times daily. 360 tablet 4  . rosuvastatin (CRESTOR) 40 MG tablet Take 1 tablet (40 mg total) by mouth daily. 90 tablet 3  . vitamin B-12 (CYANOCOBALAMIN) 1000 MCG tablet Take 500 mcg by mouth 3 (three) times daily.     . Zinc 30 MG TABS Take 2 tablets by mouth daily.      No current facility-administered medications on file prior to visit.   Review of Systems All otherwise neg per pt     Objective:   Physical Exam BP 122/68 (BP Location: Left Arm, Patient Position: Sitting, Cuff Size: Large)   Pulse 70   Temp 98.6 F (37 C) (Oral)   Ht 6\' 2"  (1.88 m)   Wt 220 lb (99.8 kg)   SpO2 96%   BMI 28.25 kg/m  VS noted,  Constitutional: Pt appears in NAD HENT: Head: NCAT.  Right Ear: External ear normal.  Left Ear: External  ear normal.  Eyes: . Pupils are equal, round, and reactive to light. Conjunctivae and EOM are normal Nose: without d/c or deformity Neck: Neck supple. Gross normal ROM Cardiovascular: Normal rate and regular rhythm.   Pulmonary/Chest: Effort normal and breath sounds without rales or wheezing.  Abd:  Soft, NT, ND, + BS, no organomegaly Neurological: Pt is alert. At baseline orientation, motor grossly intact Skin: Skin is warm. No rashes, other new lesions, no LE edema Psychiatric: Pt behavior is normal without agitation  All otherwise neg per  pt Lab Results  Component Value Date   WBC 7.0 04/12/2019   HGB 11.0 (L) 04/12/2019   HCT 32.9 (L) 04/12/2019   PLT 360.0 04/12/2019   GLUCOSE 87 10/12/2019   CHOL 131 10/12/2019   TRIG 89.0 10/12/2019   HDL 32.90 (L) 10/12/2019   LDLCALC 80 10/12/2019   ALT 27 10/12/2019   AST 21 10/12/2019   NA 138 10/12/2019   K 4.2 10/12/2019   CL 105 10/12/2019   CREATININE 1.16 10/12/2019   BUN 14 10/12/2019   CO2 28 10/12/2019   TSH 1.17 04/12/2019   PSA 1.19 04/12/2019   INR 1.01 05/27/2015   HGBA1C 6.7 (H) 10/12/2019      Assessment & Plan:

## 2020-04-11 NOTE — Assessment & Plan Note (Signed)

## 2020-04-11 NOTE — Assessment & Plan Note (Signed)
For oral replacement 

## 2020-04-12 LAB — URINALYSIS, ROUTINE W REFLEX MICROSCOPIC
Bilirubin Urine: NEGATIVE
Hgb urine dipstick: NEGATIVE
Ketones, ur: NEGATIVE
Leukocytes,Ua: NEGATIVE
Nitrite: NEGATIVE
RBC / HPF: NONE SEEN (ref 0–?)
Specific Gravity, Urine: 1.02 (ref 1.000–1.030)
Total Protein, Urine: NEGATIVE
Urine Glucose: NEGATIVE
Urobilinogen, UA: 0.2 (ref 0.0–1.0)
pH: 6 (ref 5.0–8.0)

## 2020-04-12 LAB — TSH: TSH: 1.15 u[IU]/mL (ref 0.35–4.50)

## 2020-04-12 LAB — VITAMIN B12: Vitamin B-12: 720 pg/mL (ref 211–911)

## 2020-04-12 LAB — VITAMIN D 25 HYDROXY (VIT D DEFICIENCY, FRACTURES): VITD: 38.91 ng/mL (ref 30.00–100.00)

## 2020-04-12 LAB — PSA: PSA: 1.3 ng/mL (ref 0.10–4.00)

## 2020-04-13 ENCOUNTER — Encounter: Payer: Self-pay | Admitting: Internal Medicine

## 2020-07-19 ENCOUNTER — Ambulatory Visit: Payer: Self-pay

## 2020-07-19 ENCOUNTER — Telehealth: Payer: Self-pay | Admitting: Internal Medicine

## 2020-07-19 NOTE — Telephone Encounter (Signed)
LVM fo pt to rtn my call to R/S appt with NHA on 07/19/20

## 2020-08-25 ENCOUNTER — Other Ambulatory Visit: Payer: Self-pay | Admitting: Neurology

## 2020-08-28 ENCOUNTER — Other Ambulatory Visit: Payer: Self-pay | Admitting: Neurology

## 2020-08-28 NOTE — Telephone Encounter (Signed)
Pt is needing a refill on his PHENobarbital (LUMINAL) 32.4 MG tablet sent in to the [facility] on [address].

## 2020-08-29 ENCOUNTER — Telehealth: Payer: Self-pay | Admitting: Emergency Medicine

## 2020-08-29 ENCOUNTER — Telehealth: Payer: Self-pay | Admitting: Internal Medicine

## 2020-08-29 ENCOUNTER — Encounter: Payer: Self-pay | Admitting: Internal Medicine

## 2020-08-29 MED ORDER — PHENOBARBITAL 32.4 MG PO TABS: 32.4000 mg | ORAL_TABLET | Freq: Four times a day (QID) | ORAL | 1 refills | Status: DC

## 2020-08-29 NOTE — Telephone Encounter (Signed)
I will go ahead and refill the phenobarbital.

## 2020-08-29 NOTE — Telephone Encounter (Signed)
error 

## 2020-08-29 NOTE — Addendum Note (Signed)
Addended by: Kathrynn Ducking on: 08/29/2020 02:33 PM   Modules accepted: Orders

## 2020-08-29 NOTE — Telephone Encounter (Signed)
Patient called and asked about his phenobarbitol refill.  He stated he has been out of it over a week.  The refill request was just sent in yesterday morning and was routed to Dr. Leonie Man.   Patient became very angry and hostile on the phone, demanding that I do it now.  I explained to the patient that for all calls/requests/refills to allow 24-48 hours to be answered and/or sent in to pharmacy.  He then began telling me that the pharmacy was him we denied the refill, I told him that was not true but because it is a controlled substance, I cannot just refill it, only the doctor can.  Patient was very angry and told me he would continue to call back over and over until he got his prescription.  Text message sent to Dr. Leonie Man notifying him that a controlled substance refill was requested and asked if he could complete it.

## 2020-08-29 NOTE — Telephone Encounter (Signed)
PHENobarbital (LUMINAL) 32.4 MG tablet Patient wants a refill for this medication, explained to call his neurologist because he was the one who prescribed the medication and was very upset and wanted to speak to Dr. Judi Cong assistant about this. Patients # [phone]

## 2020-08-29 NOTE — Telephone Encounter (Signed)
Thanks

## 2020-08-29 NOTE — Telephone Encounter (Signed)
Refill was completed by Dr. Jannifer Franklin.  Patient called and updated, he can pick up prescription whenever pharmacy has it ready.  Patient expressed appreciation.

## 2020-08-29 NOTE — Telephone Encounter (Signed)
I was able to speak with the pt and was bale to inform him that I was able to contact the pharmacy and the prescribing provider.  [facility] will be filling pts rx and it will be ready in the next hour or so.

## 2020-09-25 ENCOUNTER — Telehealth: Payer: Self-pay | Admitting: Internal Medicine

## 2020-09-25 NOTE — Telephone Encounter (Signed)
LVM for pt to rtn my call at [phone] to schedule AWV with NHA. Please schedule this appt if pt calls the office.

## 2020-10-06 ENCOUNTER — Telehealth: Payer: Self-pay | Admitting: Cardiovascular Disease

## 2020-10-06 MED ORDER — FUROSEMIDE 40 MG PO TABS: 40.0000 mg | ORAL_TABLET | Freq: Every day | ORAL | 0 refills | Status: DC | PRN

## 2020-10-06 NOTE — Telephone Encounter (Signed)
*  STAT* If patient is at the pharmacy, call can be transferred to refill team.   1. Which medications need to be refilled? (please list name of each medication and dose if known) furosemide (LASIX) 40 MG tablet  2. Which pharmacy/location (including street and city if local pharmacy) is medication to be sent to? [facility] [phone] - [place], [place] - [address] AT [place]  3. Do they need a 30 day or 90 day supply? 90 day   Patient is out of medication

## 2020-10-11 ENCOUNTER — Other Ambulatory Visit: Payer: Self-pay

## 2020-10-11 ENCOUNTER — Ambulatory Visit (INDEPENDENT_AMBULATORY_CARE_PROVIDER_SITE_OTHER): Payer: Medicare Other | Admitting: Internal Medicine

## 2020-10-11 ENCOUNTER — Encounter: Payer: Self-pay | Admitting: Internal Medicine

## 2020-10-11 VITALS — BP 130/70 | HR 84 | Temp 98.1°F | Ht 74.0 in | Wt 222.0 lb

## 2020-10-11 DIAGNOSIS — I5042 Chronic combined systolic (congestive) and diastolic (congestive) heart failure: Secondary | ICD-10-CM | POA: Diagnosis not present

## 2020-10-11 DIAGNOSIS — Z720 Tobacco use: Secondary | ICD-10-CM

## 2020-10-11 DIAGNOSIS — I10 Essential (primary) hypertension: Secondary | ICD-10-CM | POA: Diagnosis not present

## 2020-10-11 DIAGNOSIS — Z23 Encounter for immunization: Secondary | ICD-10-CM | POA: Diagnosis not present

## 2020-10-11 DIAGNOSIS — E7800 Pure hypercholesterolemia, unspecified: Secondary | ICD-10-CM | POA: Diagnosis not present

## 2020-10-11 DIAGNOSIS — E559 Vitamin D deficiency, unspecified: Secondary | ICD-10-CM

## 2020-10-11 DIAGNOSIS — R739 Hyperglycemia, unspecified: Secondary | ICD-10-CM

## 2020-10-11 NOTE — Patient Instructions (Addendum)
Please remember to have your Booster shot soon at [facility] had the flu shot today  Please continue all other medications as before, and refills have been done if requested.  Please have the pharmacy call with any other refills you may need.  Please continue your efforts at being more active, low cholesterol diet, and weight control.  Please keep your appointments with your specialists as you may have planned  Please go to the LAB at the blood drawing area for the tests to be done  You will be contacted by phone if any changes need to be made immediately.  Otherwise, you will receive a letter about your results with an explanation, but please check with MyChart first.  Please remember to sign up for MyChart if you have not done so, as this will be important to you in the future with finding out test results, communicating by private email, and scheduling acute appointments online when needed.  Please make an Appointment to return in 6 months, or sooner if needed

## 2020-10-11 NOTE — Progress Notes (Deleted)
   Subjective:    Patient ID: [name], male    DOB: 22-Apr-1961, 59 y.o.   MRN: [id number]  HPI    Review of Systems     Objective:   Physical Exam        Assessment & Plan:

## 2020-10-12 LAB — BASIC METABOLIC PANEL
BUN: 11 mg/dL (ref 6–23)
CO2: 29 mEq/L (ref 19–32)
Calcium: 9.5 mg/dL (ref 8.4–10.5)
Chloride: 103 mEq/L (ref 96–112)
Creatinine, Ser: 1.14 mg/dL (ref 0.40–1.50)
GFR: 70.86 mL/min (ref >60.00)
Glucose, Bld: 78 mg/dL (ref 70–99)
Potassium: 4 mEq/L (ref 3.5–5.1)
Sodium: 138 mEq/L (ref 135–145)

## 2020-10-12 LAB — LIPID PANEL
Cholesterol: 114 mg/dL (ref 0–200)
HDL: 32.7 mg/dL — ABNORMAL LOW (ref >39.00)
LDL Cholesterol: 63 mg/dL (ref 0–99)
NonHDL: 81.72
Total CHOL/HDL Ratio: 3
Triglycerides: 96 mg/dL (ref 0.0–149.0)
VLDL: 19.2 mg/dL (ref 0.0–40.0)

## 2020-10-12 LAB — HEPATIC FUNCTION PANEL
ALT: 18 U/L (ref 0–53)
AST: 16 U/L (ref 0–37)
Albumin: 4.4 g/dL (ref 3.5–5.2)
Alkaline Phosphatase: 73 U/L (ref 39–117)
Bilirubin, Direct: 0.1 mg/dL (ref 0.0–0.3)
Total Bilirubin: 0.2 mg/dL (ref 0.2–1.2)
Total Protein: 8.3 g/dL (ref 6.0–8.3)

## 2020-10-12 LAB — HEMOGLOBIN A1C: Hgb A1c MFr Bld: 6.6 % — ABNORMAL HIGH (ref 4.6–6.5)

## 2020-10-15 ENCOUNTER — Encounter: Payer: Self-pay | Admitting: Internal Medicine

## 2020-10-15 NOTE — Progress Notes (Signed)
Established Patient Office Visit  Subjective:  Patient ID: [name], male    DOB: 1961/01/10  Age: 59 y.o. MRN: [id number]      Chief Complaint: (concise statement describing the symptom, problem, condition, diagnosis, physician recommended return, or other factor as reason for encounter) follow up HTN, HLD and hyperglycemia, vit d def, smoking, chf       HPI:  [name] is a 59 y.o. male here to f/u; overall doing ok,  Pt denies chest pain, increasing sob or doe, wheezing, orthopnea, PND, increased LE swelling, palpitations, dizziness or syncope.  Pt denies new neurological symptoms such as new headache, or facial or extremity weakness or numbness.  Pt denies polydipsia, polyuria, or symptomatic low sugars. Pt states overall good compliance with meds, mostly trying to follow appropriate diet, with wt overall stable,  but little exercise however.  Still smoking, not ready to quit.   Wt Readings from Last 3 Encounters:  10/11/20 222 lb (100.7 kg)  04/11/20 220 lb (99.8 kg)  02/10/20 227 lb (103 kg)   BP Readings from Last 3 Encounters:  10/11/20 130/70  04/11/20 122/68  02/10/20 140/73    Past Medical History:  Diagnosis Date  . Anemia 01/01/2017  . CAD (coronary artery disease)    a. Overlapping BMS-prox LCx in 2009 b. NSTEMI 2012 due to prox LCx ISR s/p PTCA alone; EF 45% w/ basal inf HK. Had run out of Plavix 1 week prior. c. NSTEMI 05/2015 after running out of Plavix - cath with 3V CAD, Patient left AMA prior to agreeing to CABG. // d. LHC 8/16: LM 60; LAD mid 60, D1 75; RI 50, 80; LCx prox stent 100; RCA prox 100; EF 45-50 >> seen by TCTS - declined CABG >> Med Rx.  . Chronic combined systolic and diastolic CHF (congestive heart failure) ([place]) 06/06/2015   a - Echo 8/16:  EF 40-45, inf-lat AK, Gr 2 diastolic dysfunction, mild AI, mod MR // b - TEE 8/16: EF 45-50, inf HK, mod MR, mild TR // c - Echo 4/18: EF 40-45, ant-lat and inf-lat HK, Gr 2 DD, mild AI, mild to mod MR,  severe LAE, PASP 29 // d - Echo 4/19: Mild concentric LVH, EF 40-45, anterolateral/inferolateral HK, mild AI, moderate MR, mild LAE, mild TR, PASP 25   . Clotting disorder ([place])   . Diabetes mellitus without complication ([place])   . HLD (hyperlipidemia)   . Hyperglycemia A1C 6.0 05/2015.  [name] Hypertension   . MI (myocardial infarction) ([place]) x 7  . Poor compliance   . Seizure disorder ([place])   . Seizures ([place])   . TOBACCO ABUSE 02/22/2009  . Valvular heart disease    a. Echo 05/2015: mild AI, mod MR.   Past Surgical History:  Procedure Laterality Date  . CARDIAC CATHETERIZATION [name] 05/29/2015   Procedure: Left Heart Cath and Coronary Angiography;  Surgeon: Belva Crome, MD;  Location: [place] [facility];  Service: Cardiovascular;  Laterality: [name];  . COLONOSCOPY    . CORONARY ANGIOPLASTY  03/2011   PTCA-prox LCx ISR  . CORONARY ANGIOPLASTY WITH STENT PLACEMENT  10/2007   BMS-prox LCx  . LEFT HEART CATHETERIZATION WITH CORONARY ANGIOGRAM [name] 10/13/2013   Procedure: LEFT HEART CATHETERIZATION WITH CORONARY ANGIOGRAM;  Surgeon: Blane Ohara, MD;  Location: [facility] CATH LAB;  Service: Cardiovascular;  Laterality: [name];  . TEE WITHOUT CARDIOVERSION [name] 06/16/2015   Procedure: TRANSESOPHAGEAL ECHOCARDIOGRAM (TEE);  Surgeon: Josue Hector, MD;  Location: [facility];  Service: Cardiovascular;  Laterality: [name];    reports that he has been smoking cigars. He has smoked for the past 35.00 years. He has never used smokeless tobacco. He reports that he does not drink alcohol and does not use drugs. family history includes Coronary artery disease in his maternal aunt; Heart attack in his maternal uncle; Heart disease in his maternal uncle; Hypertension in an other family member; Stroke in his maternal uncle. No Known Allergies Current Outpatient Medications on File Prior to Visit  Medication Sig Dispense Refill  . AMBULATORY NON FORMULARY MEDICATION Medication Name: Black seed oil    . carvedilol (COREG)  3.125 MG tablet Take 1 tablet (3.125 mg total) by mouth 2 (two) times daily with a meal. t. 180 tablet 3  . clopidogrel (PLAVIX) 75 MG tablet TAKE 1 TABLET(75 MG) BY MOUTH DAILY 90 tablet 3  . ezetimibe (ZETIA) 10 MG tablet TAKE 1 TABLET(10 MG) BY MOUTH DAILY 90 tablet 3  . ferrous sulfate 325 (65 FE) MG tablet Take 1 tablet (325 mg total) by mouth daily with breakfast. 90 tablet 3  . Flaxseed, Linseed, (FLAX PO) Take by mouth.    . furosemide (LASIX) 40 MG tablet Take 1 tablet (40 mg total) by mouth daily as needed for edema. Take for weight gain (3 lbs or more in 1 day) or leg swelling 90 tablet 0  . isosorbide mononitrate (IMDUR) 30 MG 24 hr tablet TAKE 1/2 TABLET(15 MG) BY MOUTH DAILY 45 tablet 3  . levETIRAcetam (KEPPRA) 500 MG tablet Take 1 tablet (500 mg total) by mouth 2 (two) times daily. 180 tablet 4  . lisinopril (ZESTRIL) 2.5 MG tablet Take 1 tablet (2.5 mg total) by mouth daily. 90 tablet 3  . nitroGLYCERIN (NITROSTAT) 0.4 MG SL tablet place 1 tablet under the tongue if needed every 5 minutes for chest pain for 3 doses 25 tablet 3  . OVER THE COUNTER MEDICATION Take 1 drop by mouth daily. BLACKSEED OIL 1 DROP BY MOUTH ONCE DAILY    . PHENobarbital (LUMINAL) 32.4 MG tablet Take 1 tablet (32.4 mg total) by mouth 4 (four) times daily. 360 tablet 1  . rosuvastatin (CRESTOR) 40 MG tablet Take 1 tablet (40 mg total) by mouth daily. 90 tablet 3  . vitamin B-12 (CYANOCOBALAMIN) 1000 MCG tablet Take 500 mcg by mouth 3 (three) times daily.     . Zinc 30 MG TABS Take 2 tablets by mouth daily.      No current facility-administered medications on file prior to visit.        ROS:  All others reviewed and negative.  Objective        PE:  BP 130/70 (BP Location: Left Arm, Patient Position: Sitting, Cuff Size: Large)   Pulse 84   Temp 98.1 F (36.7 C) (Oral)   Ht 6\' 2"  (1.88 m)   Wt 222 lb (100.7 kg)   SpO2 97%   BMI 28.50 kg/m                 Constitutional: Pt appears in NAD                HENT: Head: NCAT.                Right Ear: External ear normal.                 Left Ear: External ear normal.  Eyes: . Pupils are equal, round, and reactive to light. Conjunctivae and EOM are normal               Nose: without d/c or deformity               Neck: Neck supple. Gross normal ROM               Cardiovascular: Normal rate and regular rhythm.                 Pulmonary/Chest: Effort normal and breath sounds without rales or wheezing.                Abd:  Soft, NT, ND, + BS, no organomegaly               Neurological: Pt is alert. At baseline orientation, motor grossly intact               Skin: Skin is warm. No rashes, no other new lesions, LE edema -  none               Psychiatric: Pt behavior is normal without agitation   Assessment/Plan:  [name] is a 59 y.o. Black or African American [2] male with  has a past medical history of Anemia (01/01/2017), CAD (coronary artery disease), Chronic combined systolic and diastolic CHF (congestive heart failure) ([place]) (06/06/2015), Clotting disorder ([place]), Diabetes mellitus without complication ([place]), HLD (hyperlipidemia), Hyperglycemia (A1C 6.0 05/2015.), Hypertension, MI (myocardial infarction) ([place]) (x 7), Poor compliance, Seizure disorder ([place]), Seizures ([place]), TOBACCO ABUSE (02/22/2009), and Valvular heart disease.  Assessment Plan  See notes Labs reviewed for each problem: Lab Results  Component Value Date   WBC 6.2 04/11/2020   HGB 11.9 (L) 04/11/2020   HCT 34.6 (L) 04/11/2020   PLT 349.0 04/11/2020   GLUCOSE 78 10/11/2020   CHOL 114 10/11/2020   TRIG 96.0 10/11/2020   HDL 32.70 (L) 10/11/2020   LDLCALC 63 10/11/2020   ALT 18 10/11/2020   AST 16 10/11/2020   NA 138 10/11/2020   K 4.0 10/11/2020   CL 103 10/11/2020   CREATININE 1.14 10/11/2020   BUN 11 10/11/2020   CO2 29 10/11/2020   TSH 1.15 04/11/2020   PSA 1.30 04/11/2020   INR 1.01 05/27/2015   HGBA1C 6.6 (H) 10/11/2020   MICROALBUR 0.8  04/11/2020    Micro: none  Pertinent Radiological findings (summarize): none  Cardiac tracings I have personally interpreted today:  none   I spent total 33 minutes in caring for the patient for this visit:  1) by communicating with the patient during the visit  2) by review of pertinent vital sign data, physical examination and labs as documented in the assessment and plan  3) by review of pertinent imaging - none today  4) by review of pertinent procedures - none today  5) by obtaining and reviewing separately obtained information from family/caretaker and Care Everywhere - none today  6) by ordering medications  7) by ordering tests  8) by documenting all of this clinical information in the EHR including the management of each problem noted today in assessment and plan   Problem List Items Addressed This Visit      High   Chronic combined systolic and diastolic CHF (congestive heart failure) (HCC)     Medium   Vitamin D deficiency   Tobacco abuse   Pure hypercholesterolemia   Relevant Orders   Lipid panel (Completed)   Hepatic  function panel (Completed)   Basic metabolic panel (Completed)   Hyperglycemia - Primary   Relevant Orders   Hemoglobin A1c (Completed)   Essential hypertension    Other Visit Diagnoses    Flu vaccine need       Relevant Orders   Flu Vaccine QUAD 6+ mos PF IM (Fluarix Quad PF) (Completed)      No orders of the defined types were placed in this encounter.   Follow-up: 6 mo   Cathlean Cower, MD 10/15/2020 7:59 PM [facility]

## 2020-10-15 NOTE — Assessment & Plan Note (Signed)
stable overall by history and exam, recent data reviewed with pt, and pt to continue medical treatment as before,  to f/u any worsening symptoms or concerns  

## 2020-10-15 NOTE — Assessment & Plan Note (Signed)
Cont oral replacement 

## 2020-10-15 NOTE — Assessment & Plan Note (Signed)
Cont zetia, crestor Lab Results  Component Value Date   LDLCALC 63 10/11/2020  stable overall by history and exam, recent data reviewed with pt, and pt to continue medical treatment as before,  to f/u any worsening symptoms or concerns

## 2020-10-15 NOTE — Assessment & Plan Note (Signed)
BP Readings from Last 3 Encounters:  10/11/20 130/70  04/11/20 122/68  02/10/20 140/73  stable overall by history and exam, recent data reviewed with pt, and pt to continue medical treatment as before,  to f/u any worsening symptoms or concerns

## 2020-10-15 NOTE — Assessment & Plan Note (Signed)
Lab Results  Component Value Date   LDLCALC 63 10/11/2020  stable overall by history and exam, recent data reviewed with pt, and pt to continue medical treatment as before,  to f/u any worsening symptoms or concerns

## 2020-10-15 NOTE — Assessment & Plan Note (Signed)
Counseled to quit 

## 2020-10-16 ENCOUNTER — Telehealth: Payer: Medicare Other

## 2020-10-16 ENCOUNTER — Other Ambulatory Visit: Payer: Self-pay

## 2020-10-23 ENCOUNTER — Telehealth: Payer: Self-pay | Admitting: Internal Medicine

## 2020-10-23 NOTE — Telephone Encounter (Signed)
LVM for pt to rtn my call to R/S appt with NHA on 10/16/20

## 2020-10-24 ENCOUNTER — Other Ambulatory Visit: Payer: Self-pay | Admitting: Physician Assistant
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
## 2020-11-03 ENCOUNTER — Ambulatory Visit: Payer: Medicare Other

## 2020-11-07 ENCOUNTER — Other Ambulatory Visit: Payer: Self-pay | Admitting: Physician Assistant

## 2020-11-07 DIAGNOSIS — I340 Nonrheumatic mitral (valve) insufficiency: Secondary | ICD-10-CM

## 2020-11-07 DIAGNOSIS — Z91199 Patient's noncompliance with other medical treatment and regimen due to unspecified reason: Secondary | ICD-10-CM

## 2020-11-07 DIAGNOSIS — I214 Non-ST elevation (NSTEMI) myocardial infarction: Secondary | ICD-10-CM

## 2020-11-07 DIAGNOSIS — I10 Essential (primary) hypertension: Secondary | ICD-10-CM

## 2020-11-07 DIAGNOSIS — I25118 Atherosclerotic heart disease of native coronary artery with other forms of angina pectoris: Secondary | ICD-10-CM

## 2020-11-07 DIAGNOSIS — Z9119 Patient's noncompliance with other medical treatment and regimen: Secondary | ICD-10-CM

## 2020-11-09 ENCOUNTER — Ambulatory Visit: Payer: Medicare Other

## 2020-11-09 NOTE — Telephone Encounter (Signed)
° °  Patient calling upset, states he didn't know appointment for 1/20 was rescheduled to 01/31 He has several complaints, requesting call from supervisor

## 2020-11-15 ENCOUNTER — Encounter: Payer: Self-pay | Admitting: Physician Assistant

## 2020-11-15 ENCOUNTER — Other Ambulatory Visit: Payer: Self-pay

## 2020-11-15 ENCOUNTER — Ambulatory Visit (INDEPENDENT_AMBULATORY_CARE_PROVIDER_SITE_OTHER): Payer: Medicare Other | Admitting: Physician Assistant

## 2020-11-15 VITALS — BP 134/74 | HR 84 | Ht 74.0 in | Wt 228.8 lb

## 2020-11-15 DIAGNOSIS — E7800 Pure hypercholesterolemia, unspecified: Secondary | ICD-10-CM

## 2020-11-15 DIAGNOSIS — I10 Essential (primary) hypertension: Secondary | ICD-10-CM

## 2020-11-15 DIAGNOSIS — I340 Nonrheumatic mitral (valve) insufficiency: Secondary | ICD-10-CM

## 2020-11-15 DIAGNOSIS — I25119 Atherosclerotic heart disease of native coronary artery with unspecified angina pectoris: Secondary | ICD-10-CM

## 2020-11-15 DIAGNOSIS — I5020 Unspecified systolic (congestive) heart failure: Secondary | ICD-10-CM

## 2020-11-15 MED ORDER — SPIRONOLACTONE 25 MG PO TABS: 12.5000 mg | ORAL_TABLET | Freq: Every day | ORAL | 3 refills | Status: DC

## 2020-11-15 NOTE — Progress Notes (Signed)
Cardiology Office Note:    Date:  11/15/2020   ID:  [name], DOB [birth year], MRN [id number]  PCP:  Biagio Borg, MD  [facility] Cardiologist:  Sherren Mocha, MD   [facility] Electrophysiologist:  None   Referring MD: Biagio Borg, MD   Chief Complaint:  Follow-up (CAD, CHF/)    Patient Profile:    [name] is a 60 y.o. male with:   Coronary artery disease  S/p BMS x2 to the proximal LCx in 2009  NSTEMI in 2012 2/2 ISR >> PCI: POBA to LCx  NSTEMI in 2016>> cath: RCA 100, LCx 100, mod disease in LM and LAD - Pt declined CABG>>Med Rx  Ischemic cardiomyopathy  Heart failure with mildly reduced ejection fraction (HFmrEF)  Echocardiogram 4/19: EF 40-45  Moderate mitral regurgitation   Hypertension  Hyperlipidemia  Seizure disorder  Self-reported history of clotting disorder  Tobacco use  Nonadherence  Prior CV studies:   Echo 01/29/2018 Mild concentric LVH, EF 40-45, anterolateral and inferolateral HK, mild AI, moderate MR, mild LAE, normal RVSF, mild TR, PASP 25  Echocardiogram 01/31/17 EF 40-45, anterolateral/inferolateral hypokinesis, grade 2 diastolic dysfunction, mild AI, mild to moderate MR, severe LAE, normal RV SF, PASP 29  TEE 8/16 EF 45-50, inf HK, mod MR, mild TR  LHC 8/16 LM 60 LAD mid 60, D1 75 RI 50, 80 LCx prox stent 100 RCA prox 100 EF 45-50  Echo 8/16 EF 40-45, inf-lat AK, Gr 2 diastolic dysfunction, mild AI, mod MR   History of Present Illness:    [name] was last seen by [name], PA-C in 1/21 via Telemedicine.  He returns for f/u.  He is here alone.  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  He is on disability now.  He does try to remain active. He gets short of breath going up the hill behind his house with the trash can.  He has not had chest pain, syncope, orthopnea, leg edema.        Past Medical History:  Diagnosis Date  . Anemia 01/01/2017  . CAD (coronary  artery disease)    a. Overlapping BMS-prox LCx in 2009 b. NSTEMI 2012 due to prox LCx ISR s/p PTCA alone; EF 45% w/ basal inf HK. Had run out of Plavix 1 week prior. c. NSTEMI 05/2015 after running out of Plavix - cath with 3V CAD, Patient left AMA prior to agreeing to CABG. // d. LHC 8/16: LM 60; LAD mid 60, D1 75; RI 50, 80; LCx prox stent 100; RCA prox 100; EF 45-50 >> seen by TCTS - declined CABG >> Med Rx.  . Chronic combined systolic and diastolic CHF (congestive heart failure) ([place]) 06/06/2015   a - Echo 8/16:  EF 40-45, inf-lat AK, Gr 2 diastolic dysfunction, mild AI, mod MR // b - TEE 8/16: EF 45-50, inf HK, mod MR, mild TR // c - Echo 4/18: EF 40-45, ant-lat and inf-lat HK, Gr 2 DD, mild AI, mild to mod MR, severe LAE, PASP 29 // d - Echo 4/19: Mild concentric LVH, EF 40-45, anterolateral/inferolateral HK, mild AI, moderate MR, mild LAE, mild TR, PASP 25   . Clotting disorder ([place])   . Diabetes mellitus without complication ([place])   . HLD (hyperlipidemia)   . Hyperglycemia A1C 6.0 05/2015.  [name] Hypertension   . MI (myocardial infarction) ([place]) x 7  . Poor compliance   . Seizure disorder ([place])   .  Seizures ([place])   . TOBACCO ABUSE 02/22/2009  . Valvular heart disease    a. Echo 05/2015: mild AI, mod MR.    Current Medications: Current Meds  Medication Sig  . carvedilol (COREG) 3.125 MG tablet TAKE 1 TABLET(3.125 MG) BY MOUTH TWICE DAILY WITH A MEAL  . clopidogrel (PLAVIX) 75 MG tablet TAKE 1 TABLET(75 MG) BY MOUTH DAILY  . ezetimibe (ZETIA) 10 MG tablet TAKE 1 TABLET(10 MG) BY MOUTH DAILY  . ferrous sulfate 325 (65 FE) MG tablet Take 1 tablet (325 mg total) by mouth daily with breakfast.  . furosemide (LASIX) 40 MG tablet Take 1 tablet (40 mg total) by mouth daily as needed for edema. Take for weight gain (3 lbs or more in 1 day) or leg swelling  . isosorbide mononitrate (IMDUR) 30 MG 24 hr tablet TAKE 1/2 TABLET(15 MG) BY MOUTH DAILY  . levETIRAcetam (KEPPRA) 500 MG tablet Take 1 tablet  (500 mg total) by mouth 2 (two) times daily.  [name] lisinopril (ZESTRIL) 2.5 MG tablet Take 1 tablet (2.5 mg total) by mouth daily.  . Multiple Vitamin (ONE-A-DAY MENS PO) Take 1 tablet by mouth daily.  . nitroGLYCERIN (NITROSTAT) 0.4 MG SL tablet place 1 tablet under the tongue if needed every 5 minutes for chest pain for 3 doses  . OVER THE COUNTER MEDICATION Take 1 drop by mouth daily. BLACKSEED OIL 1 DROP BY MOUTH ONCE DAILY  . PHENobarbital (LUMINAL) 32.4 MG tablet Take 1 tablet (32.4 mg total) by mouth 4 (four) times daily.  . rosuvastatin (CRESTOR) 40 MG tablet TAKE 1 TABLET(40 MG) BY MOUTH DAILY  . spironolactone (ALDACTONE) 25 MG tablet Take 0.5 tablets (12.5 mg total) by mouth daily.  . TURMERIC PO Take by mouth.  [name] UNABLE TO FIND Avocado tea  . UNABLE TO FIND [place] tea  . vitamin B-12 (CYANOCOBALAMIN) 1000 MCG tablet Take 500 mcg by mouth 3 (three) times daily.   . Zinc 30 MG TABS Take 2 tablets by mouth daily.      Allergies:   Patient has no known allergies.   Social History   Tobacco Use  . Smoking status: Current Some Day Smoker    Years: 35.00    Types: Cigars  . Smokeless tobacco: Never Used  . Tobacco comment: smoke cigars 2 per day  Vaping Use  . Vaping Use: Never used  Substance Use Topics  . Alcohol use: No  . Drug use: No     Family Hx: The patient's family history includes Coronary artery disease in his maternal aunt; Heart attack in his maternal uncle; Heart disease in his maternal uncle; Hypertension in an other family member; Stroke in his maternal uncle. There is no history of Colon cancer, Stomach cancer, Rectal cancer, or Pancreatic cancer.  ROS   EKGs/Labs/Other Test Reviewed:    EKG:  EKG is   ordered today.  The ekg ordered today demonstrates normal sinus rhythm, HR 80, normal axis, LVH, non-specific ST-TW changes, QTc 429 ms  Recent Labs: 04/11/2020: Hemoglobin 11.9; Platelets 349.0; TSH 1.15 10/11/2020: ALT 18; BUN 11; Creatinine, Ser 1.14;  Potassium 4.0; Sodium 138   Recent Lipid Panel Lab Results  Component Value Date/Time   CHOL 114 10/11/2020 04:32 PM   CHOL 135 12/31/2018 04:14 PM   TRIG 96.0 10/11/2020 04:32 PM   HDL 32.70 (L) 10/11/2020 04:32 PM   HDL 31 (L) 12/31/2018 04:14 PM   CHOLHDL 3 10/11/2020 04:32 PM   LDLCALC 63 10/11/2020 04:32 PM  [place] 93 12/31/2018 04:14 PM      Risk Assessment/Calculations:      Physical Exam:    VS:  BP 134/74   Pulse 84   Ht 6\' 2"  (1.88 m)   Wt 228 lb 12.8 oz (103.8 kg)   SpO2 95%   BMI 29.38 kg/m     Wt Readings from Last 3 Encounters:  11/15/20 228 lb 12.8 oz (103.8 kg)  10/11/20 222 lb (100.7 kg)  04/11/20 220 lb (99.8 kg)     Constitutional:      Appearance: Healthy appearance. Not in distress.  Neck:     Vascular: No JVR. JVD normal.  Pulmonary:     Effort: Pulmonary effort is normal.     Breath sounds: No wheezing. No rales.  Cardiovascular:     Normal rate. Regular rhythm. Normal S1. Normal S2.     Murmurs: There is a grade 2/6 systolic murmur at the LLSB.  Edema:    Peripheral edema absent.  Abdominal:     Palpations: Abdomen is soft. There is no hepatomegaly.  Skin:    General: Skin is warm and dry.  Neurological:     General: No focal deficit present.     Mental Status: Alert and oriented to person, place and time.     Cranial Nerves: Cranial nerves are intact.       ASSESSMENT & PLAN:    1. HFmrEF (heart failure with mildly reduced EF) EF 40-45.  Ischemic CM.  NYHA II.  Volume status stable.  His BP has run low in the past and we could only get him on low dose beta-blocker and ACE.  His pressure is higher now and I would like to get him on MRA.  Start spironolactone 12.5 mg once daily.  BMET q week x 2.  Arrange f/u Echocardiogram.  If EF is lower, I will try to transition him to [facility], advance his beta-blocker and consider SGLT2i.  F/u with me in 6 mos.   2. Coronary artery disease involving native coronary artery of native heart  with angina pectoris [facility]) S/p prior NSTEMI in 2016 with 3 v CAD on cardiac catheterization.  He declined CABG and has been tx medically.  He is not having anginal symptoms.  Continue current dose of clopidogrel, carvedilol, rosuvastatin, isosorbide.    3. Essential hypertension BP borderline.  Add spironolactone as noted.    4. Nonrheumatic mitral valve regurgitation Mod by echocardiogram in 2019.  Obtain f/u echocardiogram as noted.    5. Pure hypercholesterolemia LDL optimal on most recent lab work.  Continue current Rx.       Dispo:  Return in about 6 months (around 05/15/2021) for Routine Follow Up, w/ Richardson Dopp, PA-C, in person.   Medication Adjustments/Labs and Tests Ordered: Current medicines are reviewed at length with the patient today.  Concerns regarding medicines are outlined above.  Tests Ordered: Orders Placed This Encounter  Procedures  . Basic metabolic panel  . Basic metabolic panel  . EKG 12-Lead  . ECHOCARDIOGRAM COMPLETE   Medication Changes: Meds ordered this encounter  Medications  . spironolactone (ALDACTONE) 25 MG tablet    Sig: Take 0.5 tablets (12.5 mg total) by mouth daily.    Dispense:  45 tablet    Refill:  3    Signed, Richardson Dopp, PA-C  11/15/2020 4:26 PM    [facility] [address], [place], [place]  [address] Phone: [phone]; Fax: [phone]

## 2020-11-15 NOTE — Patient Instructions (Signed)
Medication Instructions:  Your physician has recommended you make the following change in your medication:   1) Start Spironolactone 25 mg, 0.5 tablet by mouth once a day  *If you need a refill on your cardiac medications before your next appointment, please call your pharmacy*  Lab Work: Your physician recommends that you return for lab work in: 1 week  Your physician recommends that you return for lab work in: 2 weeks  Testing/Procedures: Your physician has requested that you have an echocardiogram. Echocardiography is a painless test that uses sound waves to create images of your heart. It provides your doctor with information about the size and shape of your heart and how well your heart's chambers and valves are working. This procedure takes approximately one hour. There are no restrictions for this procedure.  Follow-Up: At [facility], you and your health needs are our priority.  As part of our continuing mission to provide you with exceptional heart care, we have created designated Provider Care Teams.  These Care Teams include your primary Cardiologist (physician) and Advanced Practice Providers (APPs -  Physician Assistants and Nurse Practitioners) who all work together to provide you with the care you need, when you need it.  Your next appointment:   6 month(s)  The format for your next appointment:   In Person  Provider:   Richardson Dopp, PA-C

## 2020-11-20 ENCOUNTER — Ambulatory Visit: Payer: Medicare Other

## 2020-11-20 ENCOUNTER — Telehealth: Payer: Self-pay | Admitting: Physician Assistant

## 2020-11-20 NOTE — Telephone Encounter (Signed)
Pt called to report that he has a "virtual visit" with someone today and he needs to know what to say about the Spironolactone that he was started on at his last OV with Richardson Dopp PA 11/15/20.   He says that after his first dose he developed stomach pain, diarrhea and felt was caused the by the spironolactone has not taken another since and he says he will not take it again.   He reports he still has the GI upset and I advised that it may not have been from the diuretic but he is still declining it... I also spoke with him about it possibly be the Omicron virus but he says he is sure it is not Covid.    Pt to keep his Echo appt 12/08/20.   I cancelled his upcoming lab appts with him not taking the fluid pill.   Will forward to [employer] PA for his information and review.    From his OV:  (Start spironolactone 12.5 mg once daily.  BMET q week x 2.  Arrange f/u Echocardiogram.

## 2020-11-20 NOTE — Telephone Encounter (Signed)
Pt advised and will keep his appt for his Echo and will wait for further med plan thereafter.

## 2020-11-20 NOTE — Telephone Encounter (Signed)
Left message for the pt to call back.

## 2020-11-20 NOTE — Telephone Encounter (Signed)
Patient returning call.

## 2020-11-20 NOTE — Telephone Encounter (Signed)
Ok. DC spironolactone.   List intolerance in chart. Keep appt for echocardiogram in Feb.  I will see what his EF is at that time and decide if further med adjustments needed. Thanks [employer], [place]    11/20/2020 1:54 PM

## 2020-11-20 NOTE — Telephone Encounter (Signed)
Pt c/o medication issue:  1. Name of Medication: spironolactone (ALDACTONE) 25 MG tablet  2. How are you currently taking this medication (dosage and times per day)? Hasn't taken since 11/15/20  3. Are you having a reaction (difficulty breathing--STAT)? Yes   4. What is your medication issue? [name] is calling stating he had a reaction when taking this medication. He states he had stomach pain, lower back pain, and it also caused him to use the bathroom. He has not taken this medication since 11/15/20 due to this. [name] has a virtual appointment around 3 today, so he is requesting a callback prior so he is able to answer. Please advise.

## 2020-11-20 NOTE — Telephone Encounter (Signed)
    Patient calling to request compensation for gasoline to an appointment he didn't have.  Advised patient he did not have an appointment today Gave reminder that he/ himself cancelled appointment in 1/20. Patient wants call from supervisor to discuss appointment and reimbursement

## 2020-11-22 ENCOUNTER — Other Ambulatory Visit: Payer: Medicare Other

## 2020-11-22 NOTE — Telephone Encounter (Signed)
Left a vm for patient to return my call regarding his missed appointment

## 2020-11-29 ENCOUNTER — Other Ambulatory Visit: Payer: Medicare Other

## 2020-12-01 ENCOUNTER — Telehealth: Payer: Self-pay | Admitting: Internal Medicine

## 2020-12-01 ENCOUNTER — Telehealth: Payer: Self-pay | Admitting: Cardiovascular Disease

## 2020-12-01 DIAGNOSIS — I739 Peripheral vascular disease, unspecified: Secondary | ICD-10-CM

## 2020-12-01 NOTE — Telephone Encounter (Signed)
Attempted phone call to [place], NP with [facility].  Unable to leave voicemail message d/t mailbox being full.  Will forward information to Dr Antionette Char nurse and Richardson Dopp, PA-C who last saw the pt.

## 2020-12-01 NOTE — Telephone Encounter (Signed)
[place] from [facility] called   Performed a PAD test on patient. Reported severe PAD.  Left foot 0.18% and Right foot 0.15%.. Test results are questionable due to patient refusing to remove his socks.   Recommended to redo the PAD screening for a more accurate test result.   Best contact # [phone]

## 2020-12-01 NOTE — Telephone Encounter (Signed)
Linward Foster is a NP calling from [employer] to give Dr. Burt Knack or his nurse results on a Quantiflo Test for this patient. Please advise.

## 2020-12-01 NOTE — Telephone Encounter (Signed)
Please arrange ABIs and LE arterial dopplers. Dx: peripheral arterial disease  Richardson Dopp, PA-C    12/01/2020 6:30 PM

## 2020-12-02 NOTE — Telephone Encounter (Signed)
Needs rov please 

## 2020-12-04 NOTE — Telephone Encounter (Signed)
Called pt to set up OV, LVM.

## 2020-12-04 NOTE — Telephone Encounter (Signed)
Spoke with pt and advised per Richardson Dopp, Pecola Leisure, NP with [facility] who completed home visit with pt last week reported abnormal results of testing for peripheral arterial disease of legs.  Mr [name] would like additional testing to confirm results.  Pt verbalizes understanding and agrees to further testing.  Pt advised he will be contacted re: scheduling of test.

## 2020-12-08 ENCOUNTER — Other Ambulatory Visit: Payer: Self-pay

## 2020-12-08 ENCOUNTER — Ambulatory Visit (HOSPITAL_COMMUNITY): Payer: Medicare Other | Attending: Internal Medicine

## 2020-12-08 DIAGNOSIS — I10 Essential (primary) hypertension: Secondary | ICD-10-CM

## 2020-12-08 DIAGNOSIS — I340 Nonrheumatic mitral (valve) insufficiency: Secondary | ICD-10-CM

## 2020-12-08 DIAGNOSIS — I5020 Unspecified systolic (congestive) heart failure: Secondary | ICD-10-CM

## 2020-12-08 LAB — ECHOCARDIOGRAM COMPLETE
Area-P 1/2: 3.53 cm2
P 1/2 time: 586 msec
S' Lateral: 5.2 cm

## 2020-12-09 ENCOUNTER — Other Ambulatory Visit: Payer: Self-pay | Admitting: Physician Assistant

## 2020-12-09 DIAGNOSIS — I340 Nonrheumatic mitral (valve) insufficiency: Secondary | ICD-10-CM

## 2020-12-09 DIAGNOSIS — I10 Essential (primary) hypertension: Secondary | ICD-10-CM

## 2020-12-09 DIAGNOSIS — Z9119 Patient's noncompliance with other medical treatment and regimen: Secondary | ICD-10-CM

## 2020-12-09 DIAGNOSIS — I214 Non-ST elevation (NSTEMI) myocardial infarction: Secondary | ICD-10-CM

## 2020-12-09 DIAGNOSIS — I25118 Atherosclerotic heart disease of native coronary artery with other forms of angina pectoris: Secondary | ICD-10-CM

## 2020-12-09 DIAGNOSIS — Z91199 Patient's noncompliance with other medical treatment and regimen due to unspecified reason: Secondary | ICD-10-CM

## 2020-12-11 ENCOUNTER — Encounter: Payer: Self-pay | Admitting: Physician Assistant

## 2020-12-11 ENCOUNTER — Telehealth: Payer: Self-pay | Admitting: Physician Assistant

## 2020-12-11 DIAGNOSIS — I77810 Thoracic aortic ectasia: Secondary | ICD-10-CM | POA: Insufficient documentation

## 2020-12-11 NOTE — Telephone Encounter (Signed)
Follow up:   Patient returning a call back.  

## 2020-12-11 NOTE — Telephone Encounter (Signed)
-----   Message from Liliane Shi, PA-C sent at 12/11/2020 10:00 AM EST ----- EF is stable.  There is mild to mod leakage of the mitral and aortic valves.  We will keep an eye on this with serial echocardiograms.  The aorta is dilated.  I would like to see if we can get him on Entresto.  He was not able to tolerate the spironolactone. PLAN:  - Stop Lisinopril. - 36 hours after last dose of Lisinopril, start Entresto 24/26 mg twice daily (provide samples/Rx card/etc) - BMET 2 weeks after starting Entresto. - F/u with me in 6-8 weeks - Arrange Chest CTA (Dx: dilated ascending aorta)  Richardson Dopp, PA-C    12/11/2020 9:51 AM

## 2020-12-11 NOTE — Telephone Encounter (Signed)
Reviewed results with patient who verbalized understanding.   The patient became increasingly frustrated stating new medications make him sick, he's already taking too many, and he doesn't want any new meds.  Education was attempted several times and the patient refused any changes.  He states he has been doing fine for years and simply because he would not remove his socks for a study he now has "problems" and he is uninterested in making changes.  He requests to reschedule his LEA study scheduled in March. He understands a scheduler will call to reschedule. He ultimately stated that he would call the office when his car is complete and discuss potential changes at that time.

## 2020-12-18 ENCOUNTER — Encounter (HOSPITAL_COMMUNITY): Payer: Medicare Other

## 2020-12-22 ENCOUNTER — Ambulatory Visit (HOSPITAL_COMMUNITY)
Admission: RE | Admit: 2020-12-22 | Payer: Medicare Other | Source: Ambulatory Visit | Attending: Physician Assistant | Admitting: Physician Assistant

## 2021-01-02 ENCOUNTER — Other Ambulatory Visit: Payer: Self-pay | Admitting: Physician Assistant

## 2021-01-10 ENCOUNTER — Other Ambulatory Visit: Payer: Self-pay | Admitting: Physician Assistant

## 2021-01-12 ENCOUNTER — Ambulatory Visit (HOSPITAL_COMMUNITY)
Admission: RE | Admit: 2021-01-12 | Payer: Medicare Other | Source: Ambulatory Visit | Attending: Physician Assistant | Admitting: Physician Assistant

## 2021-01-22 ENCOUNTER — Other Ambulatory Visit: Payer: Self-pay | Admitting: Cardiovascular Disease

## 2021-01-22 DIAGNOSIS — I5020 Unspecified systolic (congestive) heart failure: Secondary | ICD-10-CM

## 2021-01-22 DIAGNOSIS — I25119 Atherosclerotic heart disease of native coronary artery with unspecified angina pectoris: Secondary | ICD-10-CM

## 2021-02-02 ENCOUNTER — Ambulatory Visit (HOSPITAL_COMMUNITY)
Admission: RE | Admit: 2021-02-02 | Payer: Medicare Other | Source: Ambulatory Visit | Attending: Physician Assistant | Admitting: Physician Assistant

## 2021-02-09 ENCOUNTER — Other Ambulatory Visit: Payer: Self-pay | Admitting: Physician Assistant

## 2021-02-19 ENCOUNTER — Encounter: Payer: Self-pay | Admitting: Neurology

## 2021-02-19 ENCOUNTER — Ambulatory Visit (INDEPENDENT_AMBULATORY_CARE_PROVIDER_SITE_OTHER): Payer: Medicare Other | Admitting: Neurology

## 2021-02-19 VITALS — BP 122/70 | HR 80 | Ht 74.5 in | Wt 229.0 lb

## 2021-02-19 DIAGNOSIS — G40309 Generalized idiopathic epilepsy and epileptic syndromes, not intractable, without status epilepticus: Secondary | ICD-10-CM | POA: Diagnosis not present

## 2021-02-19 MED ORDER — LEVETIRACETAM 500 MG PO TABS: 500.0000 mg | ORAL_TABLET | Freq: Two times a day (BID) | ORAL | 4 refills | Status: DC

## 2021-02-19 MED ORDER — PHENOBARBITAL 32.4 MG PO TABS: 32.4000 mg | ORAL_TABLET | Freq: Four times a day (QID) | ORAL | 3 refills | Status: DC

## 2021-02-19 NOTE — Patient Instructions (Signed)
I had a long discussion with the patient regarding his remote seizures which appear to be stable on the current medication regimen.  I recommend he continue Keppra 500 mg twice daily and phenobarbital 32.5 mg 4 times daily and the current dosages.  He was given a refill.  He was advised to avoid seizure provoking stimuli like sleep deprivation, medication noncompliance, irregular eating habits.  He will return for follow-up in the future in a year or call earlier if necessary.
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
## 2021-02-22 ENCOUNTER — Ambulatory Visit (HOSPITAL_COMMUNITY): Payer: Medicare Other

## 2021-02-27 ENCOUNTER — Other Ambulatory Visit: Payer: Self-pay | Admitting: Neurology

## 2021-02-27 MED ORDER — PHENOBARBITAL 32.4 MG PO TABS: 32.4000 mg | ORAL_TABLET | Freq: Four times a day (QID) | ORAL | 3 refills | Status: DC

## 2021-02-27 MED ORDER — LEVETIRACETAM 500 MG PO TABS: 500.0000 mg | ORAL_TABLET | Freq: Two times a day (BID) | ORAL | 4 refills | Status: DC

## 2021-02-27 NOTE — Telephone Encounter (Signed)
Received answering service message that he needs refills for phenobarbital and Keppra.  I sent these into his pharmacy [facility]).  I left a message for the patient.

## 2021-02-28 NOTE — Telephone Encounter (Signed)
thanks

## 2021-03-05 MED ORDER — PHENOBARBITAL 32.4 MG PO TABS: 32.4000 mg | ORAL_TABLET | Freq: Four times a day (QID) | ORAL | 3 refills | Status: DC

## 2021-03-05 MED ORDER — LEVETIRACETAM 500 MG PO TABS: 500.0000 mg | ORAL_TABLET | Freq: Two times a day (BID) | ORAL | 4 refills | Status: DC

## 2021-03-05 NOTE — Telephone Encounter (Signed)
[name]

## 2021-03-05 NOTE — Telephone Encounter (Signed)
Merrilee Seashore @ [facility] [phone] has called to inform that they have not received any request from Dr Felecia Shelling re: a refill request for pt's PHENobarbital (LUMINAL) 32.4 MG tablet to [facility] [phone]

## 2021-03-05 NOTE — Addendum Note (Signed)
Addended by: Britt Bottom on: 03/05/2021 05:19 PM   Modules accepted: Orders

## 2021-03-06 ENCOUNTER — Other Ambulatory Visit: Payer: Self-pay | Admitting: Neurology

## 2021-03-06 ENCOUNTER — Telehealth: Payer: Self-pay | Admitting: Neurology

## 2021-03-06 MED ORDER — PHENOBARBITAL 32.4 MG PO TABS: 32.4000 mg | ORAL_TABLET | Freq: Four times a day (QID) | ORAL | 3 refills | Status: DC

## 2021-03-06 NOTE — Telephone Encounter (Signed)
Pt is asking for a call from RN to discuss what he has been doing as a result of being out of the PHENobarbital (LUMINAL) 32.4 MG tablet .  Pt has been taking more of his Keppra in place of the PHENobarbital (LUMINAL) 32.4 MG tablet , he is asking for a all from RN to know if that is ok, please call.

## 2021-03-06 NOTE — Telephone Encounter (Signed)
done

## 2021-03-06 NOTE — Telephone Encounter (Signed)
[facility] called stating that they keep getting a prescription request for the pt's Keppra. The medication that the pt is needing is his PHENobarbital (LUMINAL) 32.4 MG tablet not the Keppra. Please resend in the correct medication as soon as possible. Pt has been out for several day's. Please advise.

## 2021-03-06 NOTE — Addendum Note (Signed)
Addended by: Wyvonnia Lora on: 03/06/2021 07:45 AM   Modules accepted: Orders

## 2021-04-11 ENCOUNTER — Other Ambulatory Visit (INDEPENDENT_AMBULATORY_CARE_PROVIDER_SITE_OTHER): Payer: Medicare Other

## 2021-04-11 ENCOUNTER — Other Ambulatory Visit: Payer: Self-pay

## 2021-04-11 ENCOUNTER — Encounter: Payer: Self-pay | Admitting: Internal Medicine

## 2021-04-11 ENCOUNTER — Ambulatory Visit (INDEPENDENT_AMBULATORY_CARE_PROVIDER_SITE_OTHER): Payer: Medicare Other | Admitting: Internal Medicine

## 2021-04-11 VITALS — BP 140/80 | HR 83 | Temp 98.0°F | Ht 74.5 in | Wt 227.0 lb

## 2021-04-11 DIAGNOSIS — E7800 Pure hypercholesterolemia, unspecified: Secondary | ICD-10-CM

## 2021-04-11 DIAGNOSIS — E538 Deficiency of other specified B group vitamins: Secondary | ICD-10-CM | POA: Diagnosis not present

## 2021-04-11 DIAGNOSIS — E785 Hyperlipidemia, unspecified: Secondary | ICD-10-CM

## 2021-04-11 DIAGNOSIS — R739 Hyperglycemia, unspecified: Secondary | ICD-10-CM

## 2021-04-11 DIAGNOSIS — E559 Vitamin D deficiency, unspecified: Secondary | ICD-10-CM

## 2021-04-11 DIAGNOSIS — Z0001 Encounter for general adult medical examination with abnormal findings: Secondary | ICD-10-CM

## 2021-04-11 DIAGNOSIS — I10 Essential (primary) hypertension: Secondary | ICD-10-CM

## 2021-04-11 LAB — BASIC METABOLIC PANEL
BUN: 14 mg/dL (ref 6–23)
CO2: 25 mEq/L (ref 19–32)
Calcium: 9.5 mg/dL (ref 8.4–10.5)
Chloride: 105 mEq/L (ref 96–112)
Creatinine, Ser: 1.08 mg/dL (ref 0.40–1.50)
GFR: 75.35 mL/min (ref >60.00)
Glucose, Bld: 96 mg/dL (ref 70–99)
Potassium: 3.8 mEq/L (ref 3.5–5.1)
Sodium: 140 mEq/L (ref 135–145)

## 2021-04-11 LAB — CBC WITH DIFFERENTIAL/PLATELET
Basophils Absolute: 0.1 10*3/uL (ref 0.0–0.1)
Basophils Relative: 1 % (ref 0.0–3.0)
Eosinophils Absolute: 0.3 10*3/uL (ref 0.0–0.7)
Eosinophils Relative: 3.6 % (ref 0.0–5.0)
HCT: 32.9 % — ABNORMAL LOW (ref 39.0–52.0)
Hemoglobin: 11.4 g/dL — ABNORMAL LOW (ref 13.0–17.0)
Lymphocytes Relative: 34.9 % (ref 12.0–46.0)
Lymphs Abs: 2.6 10*3/uL (ref 0.7–4.0)
MCHC: 34.7 g/dL (ref 30.0–36.0)
MCV: 86.2 fl (ref 78.0–100.0)
Monocytes Absolute: 1 10*3/uL (ref 0.1–1.0)
Monocytes Relative: 13.2 % — ABNORMAL HIGH (ref 3.0–12.0)
Neutro Abs: 3.5 10*3/uL (ref 1.4–7.7)
Neutrophils Relative %: 47.3 % (ref 43.0–77.0)
Platelets: 382 10*3/uL (ref 150.0–400.0)
RBC: 3.82 Mil/uL — ABNORMAL LOW (ref 4.22–5.81)
RDW: 16.8 % — ABNORMAL HIGH (ref 11.5–15.5)
WBC: 7.3 10*3/uL (ref 4.0–10.5)

## 2021-04-11 LAB — HEPATIC FUNCTION PANEL
ALT: 20 U/L (ref 0–53)
AST: 16 U/L (ref 0–37)
Albumin: 4.3 g/dL (ref 3.5–5.2)
Alkaline Phosphatase: 83 U/L (ref 39–117)
Bilirubin, Direct: 0.1 mg/dL (ref 0.0–0.3)
Total Bilirubin: 0.2 mg/dL (ref 0.2–1.2)
Total Protein: 7.6 g/dL (ref 6.0–8.3)

## 2021-04-11 LAB — LIPID PANEL
Cholesterol: 131 mg/dL (ref 0–200)
HDL: 29.3 mg/dL — ABNORMAL LOW (ref >39.00)
LDL Cholesterol: 65 mg/dL (ref 0–99)
NonHDL: 101.33
Total CHOL/HDL Ratio: 4
Triglycerides: 184 mg/dL — ABNORMAL HIGH (ref 0.0–149.0)
VLDL: 36.8 mg/dL (ref 0.0–40.0)

## 2021-04-11 MED ORDER — VITAMIN D3 50 MCG (2000 UT) PO CAPS: 2000.0000 [IU] | ORAL_CAPSULE | Freq: Every day | ORAL | 99 refills | Status: AC

## 2021-04-11 MED ORDER — FUROSEMIDE 40 MG PO TABS: ORAL_TABLET | ORAL | 3 refills | Status: AC

## 2021-04-11 NOTE — Patient Instructions (Signed)
Please take OTC Vitamin D3 at 2000 units per day, indefinitely  Please continue all other medications as before, and refills have been done if requested.  Please have the pharmacy call with any other refills you may need.  Please continue your efforts at being more active, low cholesterol diet, and weight control.  You are otherwise up to date with prevention measures today.  Please keep your appointments with your specialists as you may have planned  Please go to the LAB at the blood drawing area for the tests to be done  You will be contacted by phone if any changes need to be made immediately.  Otherwise, you will receive a letter about your results with an explanation, but please check with MyChart first.  Please remember to sign up for MyChart if you have not done so, as this will be important to you in the future with finding out test results, communicating by private email, and scheduling acute appointments online when needed.  Please make an Appointment to return in 6 months, or sooner if needed 

## 2021-04-11 NOTE — Assessment & Plan Note (Signed)
Last vitamin D Lab Results  Component Value Date   VD25OH 38.91 04/11/2020   Low normal, to start oral replacement

## 2021-04-11 NOTE — Progress Notes (Signed)
Patient ID: [name], male   DOB: 24-Aug-1961, 60 y.o.   MRN: [id number]         Chief Complaint:: wellness exam and Follow-up  Htn, hyperglycemia, low vit d, hld       HPI:  [name] is a 60 y.o. male here for wellness exam; declines covid vax booster, shingrix, and pneumovax, ow up to date with preventive referrals and immunizations                        Also not taking Vit D.  Pt denies chest pain, increased sob or doe, wheezing, orthopnea, PND, increased LE swelling, palpitations, dizziness or syncope.   Pt denies polydipsia, polyuria, or new focal neuro s/s.   Pt denies fever, wt loss, night sweats, loss of appetite, or other constitutional symptoms .  Denies worsening depressive symptoms, suicidal ideation, or panic.     Wt Readings from Last 3 Encounters:  04/11/21 227 lb (103 kg)  02/19/21 229 lb (103.9 kg)  11/15/20 228 lb 12.8 oz (103.8 kg)   BP Readings from Last 3 Encounters:  04/11/21 140/80  02/19/21 122/70  11/15/20 134/74   Immunization History  Administered Date(s) Administered   Influenza,inj,Quad PF,6+ Mos 07/16/2019, 10/11/2020   Janssen (J&J) SARS-COV-2 Vaccination 12/28/2019   Tdap 04/12/2019   There are no preventive care reminders to display for this patient.     Past Medical History:  Diagnosis Date   Anemia 01/01/2017   Ascending aorta dilation (HCC)    Echocardiogram 2/22: EF 40-45, mild LVH, normal RVSF, mild to mod MR, mild to mod AI, ascending aorta 45 mm.     CAD (coronary artery disease)    a. Overlapping BMS-prox LCx in 2009 b. NSTEMI 2012 due to prox LCx ISR s/p PTCA alone; EF 45% w/ basal inf HK. Had run out of Plavix 1 week prior. c. NSTEMI 05/2015 after running out of Plavix - cath with 3V CAD, Patient left AMA prior to agreeing to CABG. // d. LHC 8/16: LM 60; LAD mid 60, D1 75; RI 50, 80; LCx prox stent 100; RCA prox 100; EF 45-50 >> seen by TCTS - declined CABG >> Med Rx.   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   Hypertension    MI (myocardial infarction) ([place]) x 7   Poor compliance    Seizure disorder ([place])    Seizures ([place])    TOBACCO ABUSE 02/22/2009   Valvular heart disease    a. Echo 05/2015: mild AI, mod MR.   Past Surgical History:  Procedure Laterality Date   CARDIAC CATHETERIZATION [name] 05/29/2015   Procedure: Left Heart Cath and Coronary Angiography;  Surgeon: Belva Crome, MD;  Location: [facility];  Service: Cardiovascular;  Laterality: [name];   COLONOSCOPY     CORONARY  ANGIOPLASTY  03/2011   PTCA-prox LCx ISR   CORONARY ANGIOPLASTY WITH STENT PLACEMENT  10/2007   BMS-prox LCx   LEFT HEART CATHETERIZATION WITH CORONARY ANGIOGRAM [name] 10/13/2013   Procedure: LEFT HEART CATHETERIZATION WITH CORONARY ANGIOGRAM;  Surgeon: Blane Ohara, MD;  Location: [facility] CATH LAB;  Service: Cardiovascular;  Laterality: [name];   TEE WITHOUT CARDIOVERSION [name] 06/16/2015   Procedure: TRANSESOPHAGEAL ECHOCARDIOGRAM (TEE);  Surgeon: Josue Hector, MD;  Location: [facility] ENDOSCOPY;  Service: Cardiovascular;  Laterality: [name];    reports that he has been smoking cigars. He has never used smokeless tobacco. He reports that he does not drink alcohol and does not use drugs. family history includes Coronary artery disease in his maternal aunt; Heart attack in his maternal uncle; Heart disease in his maternal uncle; Hypertension in an other family member; Stroke in his maternal uncle. Allergies   Allergen Reactions   Spironolactone Diarrhea   Current Outpatient Medications on File Prior to Visit  Medication Sig Dispense Refill   carvedilol (COREG) 3.125 MG tablet TAKE 1 TABLET(3.125 MG) BY MOUTH TWICE DAILY WITH A MEAL 180 tablet 0   clopidogrel (PLAVIX) 75 MG tablet TAKE 1 TABLET(75 MG) BY MOUTH DAILY 90 tablet 3   ezetimibe (ZETIA) 10 MG tablet TAKE 1 TABLET(10 MG) BY MOUTH DAILY 90 tablet 3   ferrous sulfate 325 (65 FE) MG tablet Take 1 tablet (325 mg total) by mouth daily with breakfast. 90 tablet 3   isosorbide mononitrate (IMDUR) 30 MG 24 hr tablet TAKE 1/2 TABLET(15 MG) BY MOUTH DAILY 45 tablet 3   levETIRAcetam (KEPPRA) 500 MG tablet Take 1 tablet (500 mg total) by mouth 2 (two) times daily. 180 tablet 4   lisinopril (ZESTRIL) 2.5 MG tablet TAKE 1 TABLET(2.5 MG) BY MOUTH DAILY 90 tablet 2   Multiple Vitamin (ONE-A-DAY MENS PO) Take 1 tablet by mouth daily.     nitroGLYCERIN (NITROSTAT) 0.4 MG SL tablet place 1 tablet under the tongue if needed every 5 minutes for chest pain for 3 doses 25 tablet 3   OVER THE COUNTER MEDICATION Take 1 drop by mouth daily. BLACKSEED OIL 1 DROP BY MOUTH ONCE DAILY     PHENobarbital (LUMINAL) 32.4 MG tablet Take 1 tablet (32.4 mg total) by mouth 4 (four) times daily. 360 tablet 3   rosuvastatin (CRESTOR) 40 MG tablet TAKE 1 TABLET(40 MG) BY MOUTH DAILY 90 tablet 0   TURMERIC PO Take by mouth.     UNABLE TO FIND Avocado tea     UNABLE TO FIND Moringa tea     vitamin B-12 (CYANOCOBALAMIN) 1000 MCG tablet Take 1,000 mcg by mouth daily.     Zinc 30 MG TABS Take 2 tablets by mouth daily.      No current facility-administered medications on file prior to visit.        ROS:  All others reviewed and negative.  Objective        PE:  BP 140/80 (BP Location: Left Arm, Patient Position: Sitting, Cuff Size: Normal)   Pulse 83   Temp 98 F (36.7 C) (Oral)   Ht 6' 2.5" (1.892 m)   Wt 227 lb (103 kg)   SpO2 97%   BMI 28.76 kg/m                  Constitutional: Pt appears in NAD               HENT: Head: NCAT.  Right Ear: External ear normal.                 Left Ear: External ear normal.                Eyes: . Pupils are equal, round, and reactive to light. Conjunctivae and EOM are normal               Nose: without d/c or deformity               Neck: Neck supple. Gross normal ROM               Cardiovascular: Normal rate and regular rhythm.                 Pulmonary/Chest: Effort normal and breath sounds without rales or wheezing.                Abd:  Soft, NT, ND, + BS, no organomegaly               Neurological: Pt is alert. At baseline orientation, motor grossly intact               Skin: Skin is warm. No rashes, no other new lesions, LE edema - none               Psychiatric: Pt behavior is normal without agitation   Micro: none  Cardiac tracings I have personally interpreted today:  none  Pertinent Radiological findings (summarize): none   Lab Results  Component Value Date   WBC 7.3 04/11/2021   HGB 11.4 (L) 04/11/2021   HCT 32.9 (L) 04/11/2021   PLT 382.0 04/11/2021   GLUCOSE 96 04/11/2021   CHOL 131 04/11/2021   TRIG 184.0 (H) 04/11/2021   HDL 29.30 (L) 04/11/2021   LDLCALC 65 04/11/2021   ALT 20 04/11/2021   AST 16 04/11/2021   NA 140 04/11/2021   K 3.8 04/11/2021   CL 105 04/11/2021   CREATININE 1.08 04/11/2021   BUN 14 04/11/2021   CO2 25 04/11/2021   TSH 1.27 04/11/2021   PSA 1.75 04/11/2021   INR 1.01 05/27/2015   HGBA1C 7.3 (H) 04/11/2021   MICROALBUR 0.8 04/11/2020   Assessment/Plan:  [name] is a 60 y.o. 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  Vitamin D deficiency Last vitamin D Lab Results  Component Value Date   VD25OH 38.91 04/11/2020   Low normal, to start oral replacement   Encounter for well adult exam with abnormal findings Age and sex appropriate education and counseling updated with regular exercise and diet Referrals for preventative services - none needed Immunizations addressed - declines shingrix, covid booster and pneumovax Smoking counseling  - counseled to quit cigars, pt not ready Evidence for depression or other mood disorder - none significant Most recent labs reviewed. I have personally reviewed and have noted: 1) the patient's medical and social history 2) The patient's current medications and supplements 3) The patient's height, weight, and BMI have been recorded in the chart   Essential hypertension BP Readings from Last 3 Encounters:  04/11/21 140/80  02/19/21 122/70  11/15/20 134/74   Borderline elevated today unstable, pt to continue medical treatment lisinopril as BP at home ok and declines med change   Hyperglycemia Lab Results  Component Value Date   HGBA1C 7.3 (H) 04/11/2021   Mild uncontroled, pt to start medical treatment metformin ER 500 mg  Pure hypercholesterolemia Lab Results  Component Value Date   LDLCALC 65 04/11/2021   Stable, pt to continue current crestor and zetia tx, low chol diet Followup: Return in about 6 months (around 10/11/2021).  Cathlean Cower, MD 04/15/2021 4:44 PM [facility]

## 2021-04-12 ENCOUNTER — Other Ambulatory Visit: Payer: Self-pay | Admitting: Internal Medicine

## 2021-04-12 ENCOUNTER — Encounter: Payer: Self-pay | Admitting: Internal Medicine

## 2021-04-12 DIAGNOSIS — E1165 Type 2 diabetes mellitus with hyperglycemia: Secondary | ICD-10-CM

## 2021-04-12 LAB — URINALYSIS, ROUTINE W REFLEX MICROSCOPIC
Bilirubin Urine: NEGATIVE
Hgb urine dipstick: NEGATIVE
Ketones, ur: NEGATIVE
Leukocytes,Ua: NEGATIVE
Nitrite: NEGATIVE
Specific Gravity, Urine: 1.02 (ref 1.000–1.030)
Total Protein, Urine: NEGATIVE
Urine Glucose: NEGATIVE
Urobilinogen, UA: 0.2 (ref 0.0–1.0)
pH: 6 (ref 5.0–8.0)

## 2021-04-12 LAB — TSH: TSH: 1.27 u[IU]/mL (ref 0.35–4.50)

## 2021-04-12 LAB — HEMOGLOBIN A1C: Hgb A1c MFr Bld: 7.3 % — ABNORMAL HIGH (ref 4.6–6.5)

## 2021-04-12 LAB — VITAMIN B12: Vitamin B-12: 1457 pg/mL — ABNORMAL HIGH (ref 211–911)

## 2021-04-12 LAB — PSA: PSA: 1.75 ng/mL (ref 0.10–4.00)

## 2021-04-12 LAB — VITAMIN D 25 HYDROXY (VIT D DEFICIENCY, FRACTURES): VITD: 42.4 ng/mL (ref 30.00–100.00)

## 2021-04-12 MED ORDER — METFORMIN HCL ER 500 MG PO TB24: 500.0000 mg | ORAL_TABLET | Freq: Every day | ORAL | 3 refills | Status: DC

## 2021-04-15 ENCOUNTER — Encounter: Payer: Self-pay | Admitting: Internal Medicine

## 2021-04-15 NOTE — Assessment & Plan Note (Signed)
Lab Results  Component Value Date   HGBA1C 7.3 (H) 04/11/2021   Mild uncontroled, pt to start medical treatment metformin ER 500 mg

## 2021-04-15 NOTE — Assessment & Plan Note (Signed)
BP Readings from Last 3 Encounters:  04/11/21 140/80  02/19/21 122/70  11/15/20 134/74   Borderline elevated today unstable, pt to continue medical treatment lisinopril as BP at home ok and declines med change

## 2021-04-15 NOTE — Assessment & Plan Note (Signed)
Lab Results  Component Value Date   LDLCALC 65 04/11/2021   Stable, pt to continue current crestor and zetia tx, low chol diet

## 2021-04-15 NOTE — Assessment & Plan Note (Signed)
Age and sex appropriate education and counseling updated with regular exercise and diet Referrals for preventative services - none needed Immunizations addressed - declines shingrix, covid booster and pneumovax Smoking counseling  - counseled to quit cigars, pt not ready Evidence for depression or other mood disorder - none significant Most recent labs reviewed. I have personally reviewed and have noted: 1) the patient's medical and social history 2) The patient's current medications and supplements 3) The patient's height, weight, and BMI have been recorded in the chart

## 2021-04-16 ENCOUNTER — Telehealth: Payer: Self-pay | Admitting: Internal Medicine

## 2021-04-16 NOTE — Telephone Encounter (Signed)
Ok sure, this is no problem at all, and very commonly done in many patients, and is routine medical practice

## 2021-04-16 NOTE — Telephone Encounter (Signed)
Patient notified that metformin is ok to take.

## 2021-04-16 NOTE — Telephone Encounter (Signed)
Please return call to patient with lab results

## 2021-04-19 ENCOUNTER — Encounter: Payer: Medicare Other | Attending: Internal Medicine

## 2021-04-22 ENCOUNTER — Other Ambulatory Visit: Payer: Self-pay | Admitting: Physician Assistant

## 2021-04-22 DIAGNOSIS — I5020 Unspecified systolic (congestive) heart failure: Secondary | ICD-10-CM

## 2021-04-22 DIAGNOSIS — I25119 Atherosclerotic heart disease of native coronary artery with unspecified angina pectoris: Secondary | ICD-10-CM

## 2021-04-26 ENCOUNTER — Ambulatory Visit: Payer: Medicare Other

## 2021-05-03 ENCOUNTER — Ambulatory Visit: Payer: Medicare Other

## 2021-05-10 ENCOUNTER — Encounter: Payer: Self-pay | Admitting: Dietician

## 2021-05-10 ENCOUNTER — Other Ambulatory Visit: Payer: Self-pay

## 2021-05-10 ENCOUNTER — Encounter: Payer: Medicare Other | Attending: Internal Medicine | Admitting: Dietician

## 2021-05-10 DIAGNOSIS — E1165 Type 2 diabetes mellitus with hyperglycemia: Secondary | ICD-10-CM | POA: Diagnosis not present

## 2021-05-10 NOTE — Progress Notes (Signed)
Patient was seen on 05/10/2021 for the first of a series of three diabetes self-management courses at [facility].  Patient Education Plan per assessed needs and concerns is to attend three course education program for Diabetes Self Management Education.  The following learning objectives were met by the patient during this class: Describe diabetes, types of diabetes and pathophysiology State some common risk factors for diabetes Defines the role of glucose and insulin Describe the relationship between diabetes and cardiovascular and other risks State the members of the Healthcare Team States the rationale for glucose monitoring and when to test State their individual Target Range State the importance of logging glucose readings and how to interpret the readings Identifies A1C target Explain the correlation between A1c and eAG values State symptoms and treatment of high blood glucose and low blood glucose Explain proper technique for glucose testing and identify proper sharps disposal  Handouts given during class include: How to Thrive:  A Guide for Your Journey with Diabetes by the ADA Meal Plan Card and carbohydrate content list Dietary intake form Low Sodium Flavoring Tips Types of Fats Dining Out Label reading Snack list Planning a balanced meal The diabetes portion plate Diabetes Resources A1c to eAG Conversion Chart Blood Glucose Log Diabetes Recommended Care Schedule Support Group Diabetes Success Plan Core Class Satisfaction Survey   Follow-Up Plan: Attend core 2   

## 2021-05-17 ENCOUNTER — Ambulatory Visit: Payer: Medicare Other

## 2021-05-24 ENCOUNTER — Ambulatory Visit: Payer: Medicare Other

## 2021-07-21 ENCOUNTER — Other Ambulatory Visit: Payer: Self-pay | Admitting: Cardiovascular Disease

## 2021-07-21 DIAGNOSIS — I25119 Atherosclerotic heart disease of native coronary artery with unspecified angina pectoris: Secondary | ICD-10-CM

## 2021-07-21 DIAGNOSIS — I5020 Unspecified systolic (congestive) heart failure: Secondary | ICD-10-CM

## 2021-08-27 ENCOUNTER — Other Ambulatory Visit: Payer: Self-pay

## 2021-08-27 ENCOUNTER — Emergency Department (HOSPITAL_COMMUNITY)
Admission: EM | Admit: 2021-08-27 | Discharge: 2021-08-28 | Disposition: A | Discharge status: Home / Self Care | Payer: Medicare Other | Attending: Emergency Medicine | Admitting: Emergency Medicine

## 2021-08-27 ENCOUNTER — Encounter (HOSPITAL_COMMUNITY): Payer: Self-pay

## 2021-08-27 DIAGNOSIS — Z955 Presence of coronary angioplasty implant and graft: Secondary | ICD-10-CM | POA: Diagnosis not present

## 2021-08-27 DIAGNOSIS — I110 Hypertensive heart disease with heart failure: Secondary | ICD-10-CM | POA: Insufficient documentation

## 2021-08-27 DIAGNOSIS — M549 Dorsalgia, unspecified: Secondary | ICD-10-CM | POA: Diagnosis not present

## 2021-08-27 DIAGNOSIS — S199XXA Unspecified injury of neck, initial encounter: Secondary | ICD-10-CM | POA: Diagnosis not present

## 2021-08-27 DIAGNOSIS — E119 Type 2 diabetes mellitus without complications: Secondary | ICD-10-CM | POA: Diagnosis not present

## 2021-08-27 DIAGNOSIS — M542 Cervicalgia: Secondary | ICD-10-CM | POA: Diagnosis not present

## 2021-08-27 DIAGNOSIS — Z79899 Other long term (current) drug therapy: Secondary | ICD-10-CM | POA: Insufficient documentation

## 2021-08-27 DIAGNOSIS — M7918 Myalgia, other site: Secondary | ICD-10-CM

## 2021-08-27 DIAGNOSIS — I2510 Atherosclerotic heart disease of native coronary artery without angina pectoris: Secondary | ICD-10-CM | POA: Diagnosis not present

## 2021-08-27 DIAGNOSIS — I5042 Chronic combined systolic (congestive) and diastolic (congestive) heart failure: Secondary | ICD-10-CM | POA: Insufficient documentation

## 2021-08-27 DIAGNOSIS — I25119 Atherosclerotic heart disease of native coronary artery with unspecified angina pectoris: Secondary | ICD-10-CM | POA: Insufficient documentation

## 2021-08-27 DIAGNOSIS — F17290 Nicotine dependence, other tobacco product, uncomplicated: Secondary | ICD-10-CM | POA: Insufficient documentation

## 2021-08-27 DIAGNOSIS — Z7984 Long term (current) use of oral hypoglycemic drugs: Secondary | ICD-10-CM | POA: Diagnosis not present

## 2021-08-27 DIAGNOSIS — I10 Essential (primary) hypertension: Secondary | ICD-10-CM | POA: Diagnosis not present

## 2021-08-27 DIAGNOSIS — J439 Emphysema, unspecified: Secondary | ICD-10-CM | POA: Diagnosis not present

## 2021-08-27 DIAGNOSIS — M2578 Osteophyte, vertebrae: Secondary | ICD-10-CM | POA: Diagnosis not present

## 2021-08-27 DIAGNOSIS — Y92410 Unspecified street and highway as the place of occurrence of the external cause: Secondary | ICD-10-CM | POA: Diagnosis not present

## 2021-08-27 DIAGNOSIS — S22009A Unspecified fracture of unspecified thoracic vertebra, initial encounter for closed fracture: Secondary | ICD-10-CM | POA: Diagnosis not present

## 2021-08-27 MED ORDER — OXYCODONE-ACETAMINOPHEN 5-325 MG PO TABS
1.0000 | ORAL_TABLET | Freq: Once | ORAL | Status: AC
  Administered 2021-08-27: 1 via ORAL
  Filled 2021-08-27: qty 1
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
## 2021-08-28 ENCOUNTER — Emergency Department (HOSPITAL_COMMUNITY): Payer: Medicare Other

## 2021-08-28 DIAGNOSIS — J439 Emphysema, unspecified: Secondary | ICD-10-CM | POA: Diagnosis not present

## 2021-08-28 DIAGNOSIS — I2510 Atherosclerotic heart disease of native coronary artery without angina pectoris: Secondary | ICD-10-CM | POA: Diagnosis not present

## 2021-08-28 DIAGNOSIS — M542 Cervicalgia: Secondary | ICD-10-CM | POA: Diagnosis not present

## 2021-08-28 DIAGNOSIS — M2578 Osteophyte, vertebrae: Secondary | ICD-10-CM | POA: Diagnosis not present

## 2021-08-28 DIAGNOSIS — S199XXA Unspecified injury of neck, initial encounter: Secondary | ICD-10-CM | POA: Diagnosis not present

## 2021-08-28 DIAGNOSIS — S22009A Unspecified fracture of unspecified thoracic vertebra, initial encounter for closed fracture: Secondary | ICD-10-CM | POA: Diagnosis not present

## 2021-08-28 MED ORDER — METHOCARBAMOL 500 MG PO TABS: 500.0000 mg | ORAL_TABLET | Freq: Two times a day (BID) | ORAL | 0 refills | Status: DC

## 2021-08-28 MED ORDER — HYDROCODONE-ACETAMINOPHEN 5-325 MG PO TABS: 1.0000 | ORAL_TABLET | ORAL | 0 refills | Status: DC | PRN

## 2021-08-28 NOTE — Discharge Instructions (Signed)
Take medications for muscular pain as directed. Follow up with your doctor as needed if pain is no better in one week.   Return to the ED for any new symptoms or severe pain.

## 2021-08-29 ENCOUNTER — Telehealth: Payer: Self-pay | Admitting: Internal Medicine
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
## 2021-08-29 NOTE — Telephone Encounter (Signed)
Ok to continue all medications as rx.  Ok to let pt know he can make appt with Sports med himself if needed as referral is not needed bc sports med is in our same building

## 2021-08-30 NOTE — Telephone Encounter (Signed)
Left message for patient to call me back. 

## 2021-09-05 ENCOUNTER — Telehealth: Payer: Self-pay | Admitting: Internal Medicine

## 2021-09-05 NOTE — Telephone Encounter (Signed)
Patient returning call  Please call patient  

## 2021-09-06 NOTE — Telephone Encounter (Signed)
Patient returning call, informed patient of provider's response from previous 08-29-2021 encounter  Patient stated he understood and was transferred to sports medicine

## 2021-09-06 NOTE — Telephone Encounter (Signed)
Left message for patient to call me back; see previous encounter from 08/29/21

## 2021-09-16 ENCOUNTER — Other Ambulatory Visit: Payer: Self-pay | Admitting: Neurology

## 2021-09-18 ENCOUNTER — Other Ambulatory Visit: Payer: Self-pay | Admitting: Neurology

## 2021-09-18 ENCOUNTER — Telehealth: Payer: Self-pay | Admitting: Neurology

## 2021-09-18 NOTE — Telephone Encounter (Signed)
Called pt. Advised we last sent refill 03/06/21 for 1 year supply for both phenobarbital/keppra. He received text from pharmacy that he has no refills. I recommended he call and speak w/ pharmacy directly and let them know when we called in refill for them to locate and fill prescription. He will call back if he has any further issues.

## 2021-09-18 NOTE — Telephone Encounter (Signed)
Pt requesting refill for levETIRAcetam (KEPPRA) 500 MG tablet. Pharmacy [facility] [place], [address] AT [address]

## 2021-09-18 NOTE — Telephone Encounter (Signed)
Pt requesting refill for PHENobarbital (LUMINAL) 32.4 MG tablet. Pharmacy [phone] - [place], [address] AT [address].

## 2021-09-18 NOTE — Telephone Encounter (Signed)
See other phone note

## 2021-09-18 NOTE — Telephone Encounter (Signed)
Merrilee Seashore from [facility] called states the PHENobarbital (LUMINAL) 32.4 MG tablet is a controlled substance therefor they only keep the prescription for 6 months. Need a new prescription for it.

## 2021-09-18 NOTE — Telephone Encounter (Signed)
Merrilee Seashore from [facility] called states the PHENobarbital (LUMINAL) 32.4 MG tablet is a controlled substance therefor they only keep the prescription for 6 months. Need a new prescription for it.  [place] [facility] Verified LR:06/14/2021 Qty: [id number] for 84 days  Last OV:02/19/2021 Pending appointment:  02/20/2022

## 2021-09-18 NOTE — Addendum Note (Signed)
Addended by: Andre Lefort on: 09/18/2021 05:02 PM   Modules accepted: Orders

## 2021-09-19 MED ORDER — PHENOBARBITAL 32.4 MG PO TABS: 32.4000 mg | ORAL_TABLET | Freq: Four times a day (QID) | ORAL | 3 refills | Status: DC

## 2021-10-11 ENCOUNTER — Encounter: Payer: Self-pay | Admitting: Internal Medicine

## 2021-10-11 ENCOUNTER — Other Ambulatory Visit: Payer: Self-pay

## 2021-10-11 ENCOUNTER — Ambulatory Visit (INDEPENDENT_AMBULATORY_CARE_PROVIDER_SITE_OTHER): Payer: Medicare Other | Admitting: Internal Medicine

## 2021-10-11 VITALS — BP 102/60 | HR 61 | Temp 98.8°F | Ht 74.0 in | Wt 207.0 lb

## 2021-10-11 DIAGNOSIS — I10 Essential (primary) hypertension: Secondary | ICD-10-CM | POA: Diagnosis not present

## 2021-10-11 DIAGNOSIS — M5030 Other cervical disc degeneration, unspecified cervical region: Secondary | ICD-10-CM | POA: Diagnosis not present

## 2021-10-11 DIAGNOSIS — Z720 Tobacco use: Secondary | ICD-10-CM

## 2021-10-11 DIAGNOSIS — I5042 Chronic combined systolic (congestive) and diastolic (congestive) heart failure: Secondary | ICD-10-CM

## 2021-10-11 DIAGNOSIS — E559 Vitamin D deficiency, unspecified: Secondary | ICD-10-CM | POA: Diagnosis not present

## 2021-10-11 DIAGNOSIS — E1165 Type 2 diabetes mellitus with hyperglycemia: Secondary | ICD-10-CM

## 2021-10-11 LAB — BASIC METABOLIC PANEL
BUN: 22 mg/dL (ref 6–23)
CO2: 29 mEq/L (ref 19–32)
Calcium: 9.7 mg/dL (ref 8.4–10.5)
Chloride: 101 mEq/L (ref 96–112)
Creatinine, Ser: 1.59 mg/dL — ABNORMAL HIGH (ref 0.40–1.50)
GFR: 47.2 mL/min — ABNORMAL LOW (ref >60.00)
Glucose, Bld: 96 mg/dL (ref 70–99)
Potassium: 4.7 mEq/L (ref 3.5–5.1)
Sodium: 135 mEq/L (ref 135–145)

## 2021-10-11 LAB — HEPATIC FUNCTION PANEL
ALT: 21 U/L (ref 0–53)
AST: 19 U/L (ref 0–37)
Albumin: 4.3 g/dL (ref 3.5–5.2)
Alkaline Phosphatase: 90 U/L (ref 39–117)
Bilirubin, Direct: 0.1 mg/dL (ref 0.0–0.3)
Total Bilirubin: 0.3 mg/dL (ref 0.2–1.2)
Total Protein: 8.4 g/dL — ABNORMAL HIGH (ref 6.0–8.3)

## 2021-10-11 LAB — LIPID PANEL
Cholesterol: 123 mg/dL (ref 0–200)
HDL: 31.1 mg/dL — ABNORMAL LOW (ref >39.00)
LDL Cholesterol: 69 mg/dL (ref 0–99)
NonHDL: 92.26
Total CHOL/HDL Ratio: 4
Triglycerides: 114 mg/dL (ref 0.0–149.0)
VLDL: 22.8 mg/dL (ref 0.0–40.0)

## 2021-10-11 LAB — HEMOGLOBIN A1C: Hgb A1c MFr Bld: 6.5 % (ref 4.6–6.5)
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
## 2021-10-11 NOTE — Patient Instructions (Signed)

## 2021-10-12 ENCOUNTER — Encounter: Payer: Self-pay | Admitting: Internal Medicine

## 2021-10-12 ENCOUNTER — Other Ambulatory Visit: Payer: Self-pay | Admitting: Internal Medicine

## 2021-10-12 DIAGNOSIS — N179 Acute kidney failure, unspecified: Secondary | ICD-10-CM

## 2021-10-12 NOTE — Assessment & Plan Note (Signed)
Stable volume, cont same tx, f/u cardiology as planned

## 2021-10-12 NOTE — Assessment & Plan Note (Signed)
Last vitamin D Lab Results  Component Value Date   VD25OH 42.40 04/11/2021   Stable, cont oral replacement 

## 2021-10-12 NOTE — Assessment & Plan Note (Signed)
Lab Results  Component Value Date   HGBA1C 6.5 10/11/2021   Stable, pt to continue current medical treatment metformin

## 2021-10-12 NOTE — Assessment & Plan Note (Signed)
Pt counsled to quit, pt not ready °

## 2021-10-12 NOTE — Assessment & Plan Note (Signed)
Improving still mild uncontrolled, cont chiropracter, pain control

## 2021-10-12 NOTE — Assessment & Plan Note (Signed)
BP Readings from Last 3 Encounters:  10/11/21 102/60  08/28/21 111/60  04/11/21 140/80   Stable, pt to continue medical treatment coreg, lisinopril

## 2021-10-18 ENCOUNTER — Other Ambulatory Visit: Payer: Medicare Other

## 2021-10-19 ENCOUNTER — Other Ambulatory Visit: Payer: Self-pay | Admitting: Cardiovascular Disease

## 2021-10-19 DIAGNOSIS — I25119 Atherosclerotic heart disease of native coronary artery with unspecified angina pectoris: Secondary | ICD-10-CM

## 2021-10-19 DIAGNOSIS — I10 Essential (primary) hypertension: Secondary | ICD-10-CM

## 2021-10-19 DIAGNOSIS — I5020 Unspecified systolic (congestive) heart failure: Secondary | ICD-10-CM

## 2021-10-19 DIAGNOSIS — I25118 Atherosclerotic heart disease of native coronary artery with other forms of angina pectoris: Secondary | ICD-10-CM

## 2021-10-19 DIAGNOSIS — I340 Nonrheumatic mitral (valve) insufficiency: Secondary | ICD-10-CM

## 2021-10-19 DIAGNOSIS — I214 Non-ST elevation (NSTEMI) myocardial infarction: Secondary | ICD-10-CM

## 2021-10-19 DIAGNOSIS — Z91199 Patient's noncompliance with other medical treatment and regimen due to unspecified reason: Secondary | ICD-10-CM

## 2021-10-29 ENCOUNTER — Other Ambulatory Visit: Payer: Self-pay | Admitting: Internal Medicine

## 2021-10-29 ENCOUNTER — Encounter: Payer: Self-pay | Admitting: Internal Medicine

## 2021-10-29 ENCOUNTER — Other Ambulatory Visit (INDEPENDENT_AMBULATORY_CARE_PROVIDER_SITE_OTHER): Payer: Medicare Other

## 2021-10-29 DIAGNOSIS — N179 Acute kidney failure, unspecified: Secondary | ICD-10-CM

## 2021-10-29 DIAGNOSIS — N1831 Chronic kidney disease, stage 3a: Secondary | ICD-10-CM

## 2021-10-29 LAB — BASIC METABOLIC PANEL
BUN: 16 mg/dL (ref 6–23)
CO2: 28 mEq/L (ref 19–32)
Calcium: 9 mg/dL (ref 8.4–10.5)
Chloride: 106 mEq/L (ref 96–112)
Creatinine, Ser: 1.57 mg/dL — ABNORMAL HIGH (ref 0.40–1.50)
GFR: 47.91 mL/min — ABNORMAL LOW (ref >60.00)
Glucose, Bld: 95 mg/dL (ref 70–99)
Potassium: 4.2 mEq/L (ref 3.5–5.1)
Sodium: 140 mEq/L (ref 135–145)

## 2021-11-14 NOTE — Telephone Encounter (Signed)
Patient requesting a call back to discuss kidney function and referral  *see below*

## 2021-11-14 NOTE — Telephone Encounter (Signed)
Patient requesting call back after 12pm  *see below*

## 2021-11-19 ENCOUNTER — Telehealth: Payer: Self-pay

## 2021-11-19 NOTE — Telephone Encounter (Signed)
Ok to continue the metformin as this is not related to blood of any kind; o/w  needs OV if the bleeding has not been otherwise addressed
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
## 2021-11-21 ENCOUNTER — Other Ambulatory Visit: Payer: Self-pay | Admitting: Cardiovascular Disease

## 2021-11-22 ENCOUNTER — Ambulatory Visit (INDEPENDENT_AMBULATORY_CARE_PROVIDER_SITE_OTHER): Payer: Medicare Other | Admitting: Internal Medicine

## 2021-11-22 ENCOUNTER — Other Ambulatory Visit: Payer: Self-pay

## 2021-11-22 ENCOUNTER — Telehealth: Payer: Self-pay

## 2021-11-22 VITALS — BP 106/78 | HR 84 | Temp 98.7°F | Ht 74.0 in | Wt 204.6 lb

## 2021-11-22 DIAGNOSIS — R1011 Right upper quadrant pain: Secondary | ICD-10-CM | POA: Diagnosis not present

## 2021-11-22 DIAGNOSIS — K921 Melena: Secondary | ICD-10-CM

## 2021-11-22 DIAGNOSIS — E1165 Type 2 diabetes mellitus with hyperglycemia: Secondary | ICD-10-CM

## 2021-11-22 DIAGNOSIS — I10 Essential (primary) hypertension: Secondary | ICD-10-CM

## 2021-11-22 NOTE — Patient Instructions (Addendum)
Please continue all other medications as before, and refills have been done if requested.  Please have the pharmacy call with any other refills you may need.  Please keep your appointments with your specialists as you may have planned - the kidney doctor feb 23  You will be contacted regarding the referral for: abdomen ultrasound, and Gastroenterology  Please go to the LAB at the blood drawing area for the tests to be done  You will be contacted by phone if any changes need to be made immediately.  Otherwise, you will receive a letter about your results with an explanation, but please check with MyChart first.  Please remember to sign up for MyChart if you have not done so, as this will be important to you in the future with finding out test results, communicating by private email, and scheduling acute appointments online when needed.  Please make an Appointment to return in June 28, or sooner if needed

## 2021-11-22 NOTE — Telephone Encounter (Signed)
Patient states that he forgot to mention during appointment that while he's doing exercise and at chiropractor, he feels a throbbing pain in his testicles.
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
## 2021-11-23 ENCOUNTER — Telehealth: Payer: Self-pay | Admitting: Internal Medicine

## 2021-11-23 LAB — BASIC METABOLIC PANEL
BUN: 20 mg/dL (ref 6–23)
CO2: 29 mEq/L (ref 19–32)
Calcium: 9.3 mg/dL (ref 8.4–10.5)
Chloride: 103 mEq/L (ref 96–112)
Creatinine, Ser: 1.83 mg/dL — ABNORMAL HIGH (ref 0.40–1.50)
GFR: 39.84 mL/min — ABNORMAL LOW (ref >60.00)
Glucose, Bld: 101 mg/dL — ABNORMAL HIGH (ref 70–99)
Potassium: 5.2 mEq/L — ABNORMAL HIGH (ref 3.5–5.1)
Sodium: 136 mEq/L (ref 135–145)

## 2021-11-23 LAB — CBC WITH DIFFERENTIAL/PLATELET
Basophils Absolute: 0.1 10*3/uL (ref 0.0–0.1)
Basophils Relative: 1.4 % (ref 0.0–3.0)
Eosinophils Absolute: 0.2 10*3/uL (ref 0.0–0.7)
Eosinophils Relative: 3.5 % (ref 0.0–5.0)
HCT: 29.6 % — ABNORMAL LOW (ref 39.0–52.0)
Hemoglobin: 10 g/dL — ABNORMAL LOW (ref 13.0–17.0)
Lymphocytes Relative: 29.8 % (ref 12.0–46.0)
Lymphs Abs: 2.1 10*3/uL (ref 0.7–4.0)
MCHC: 33.9 g/dL (ref 30.0–36.0)
MCV: 86.7 fl (ref 78.0–100.0)
Monocytes Absolute: 1 10*3/uL (ref 0.1–1.0)
Monocytes Relative: 14.3 % — ABNORMAL HIGH (ref 3.0–12.0)
Neutro Abs: 3.5 10*3/uL (ref 1.4–7.7)
Neutrophils Relative %: 51 % (ref 43.0–77.0)
Platelets: 386 10*3/uL (ref 150.0–400.0)
RBC: 3.41 Mil/uL — ABNORMAL LOW (ref 4.22–5.81)
RDW: 17 % — ABNORMAL HIGH (ref 11.5–15.5)
WBC: 6.9 10*3/uL (ref 4.0–10.5)

## 2021-11-23 LAB — LIPID PANEL
Cholesterol: 107 mg/dL (ref 0–200)
HDL: 31.1 mg/dL — ABNORMAL LOW (ref >39.00)
LDL Cholesterol: 65 mg/dL (ref 0–99)
NonHDL: 75.68
Total CHOL/HDL Ratio: 3
Triglycerides: 52 mg/dL (ref 0.0–149.0)
VLDL: 10.4 mg/dL (ref 0.0–40.0)

## 2021-11-23 LAB — HEPATIC FUNCTION PANEL
ALT: 24 U/L (ref 0–53)
AST: 22 U/L (ref 0–37)
Albumin: 4.3 g/dL (ref 3.5–5.2)
Alkaline Phosphatase: 73 U/L (ref 39–117)
Bilirubin, Direct: 0.1 mg/dL (ref 0.0–0.3)
Total Bilirubin: 0.3 mg/dL (ref 0.2–1.2)
Total Protein: 8.2 g/dL (ref 6.0–8.3)

## 2021-11-23 LAB — HEMOGLOBIN A1C: Hgb A1c MFr Bld: 6.4 % (ref 4.6–6.5)

## 2021-11-23 LAB — LIPASE: Lipase: 68 U/L — ABNORMAL HIGH (ref 11.0–59.0)

## 2021-11-23 NOTE — Telephone Encounter (Signed)
[name] from [place] calls in regards to PT. He was scheduled for a stat on an ultrasound and did confirm he would be fine taking the appointment at the [facility] office on Tuesday 11/27/2021. However [facility] could not reach him over the phone to set up the appointment. They have attempted both the number we have on file [phone]) aswell as trying to reach his mother.

## 2021-11-24 ENCOUNTER — Encounter: Payer: Self-pay | Admitting: Internal Medicine

## 2021-11-24 DIAGNOSIS — K921 Melena: Secondary | ICD-10-CM | POA: Insufficient documentation

## 2021-11-24 NOTE — Assessment & Plan Note (Signed)
Etiology unclear, will likely need f/u colonoscopy - for GI referral

## 2021-11-24 NOTE — Assessment & Plan Note (Signed)
Lab Results  Component Value Date   HGBA1C 6.4 11/22/2021   Stable, pt to continue current medical treatment metformin

## 2021-11-24 NOTE — Assessment & Plan Note (Signed)
approx 3 mo persistent, will try to avoid CT due to CKD, for abd u/s, labs including lipase, and refer GI

## 2021-11-24 NOTE — Assessment & Plan Note (Signed)
BP Readings from Last 3 Encounters:  11/22/21 106/78  10/11/21 102/60  08/28/21 111/60   Stable, pt to continue medical treatment coreg

## 2021-11-26 ENCOUNTER — Emergency Department (HOSPITAL_COMMUNITY): Payer: Medicare Other

## 2021-11-26 ENCOUNTER — Emergency Department (HOSPITAL_COMMUNITY)
Admission: EM | Admit: 2021-11-26 | Discharge: 2021-11-27 | Disposition: A | Discharge status: Home / Self Care | Payer: Medicare Other | Attending: Emergency Medicine | Admitting: Emergency Medicine

## 2021-11-26 ENCOUNTER — Telehealth: Payer: Self-pay | Admitting: Internal Medicine

## 2021-11-26 ENCOUNTER — Telehealth: Payer: Self-pay

## 2021-11-26 DIAGNOSIS — Z7984 Long term (current) use of oral hypoglycemic drugs: Secondary | ICD-10-CM | POA: Diagnosis not present

## 2021-11-26 DIAGNOSIS — R7989 Other specified abnormal findings of blood chemistry: Secondary | ICD-10-CM | POA: Diagnosis not present

## 2021-11-26 DIAGNOSIS — D649 Anemia, unspecified: Secondary | ICD-10-CM | POA: Diagnosis not present

## 2021-11-26 DIAGNOSIS — K625 Hemorrhage of anus and rectum: Secondary | ICD-10-CM | POA: Diagnosis not present

## 2021-11-26 DIAGNOSIS — R3129 Other microscopic hematuria: Secondary | ICD-10-CM

## 2021-11-26 DIAGNOSIS — K5730 Diverticulosis of large intestine without perforation or abscess without bleeding: Secondary | ICD-10-CM | POA: Diagnosis not present

## 2021-11-26 DIAGNOSIS — I745 Embolism and thrombosis of iliac artery: Secondary | ICD-10-CM | POA: Diagnosis not present

## 2021-11-26 DIAGNOSIS — N281 Cyst of kidney, acquired: Secondary | ICD-10-CM | POA: Diagnosis not present

## 2021-11-26 DIAGNOSIS — K921 Melena: Secondary | ICD-10-CM | POA: Diagnosis not present

## 2021-11-26 DIAGNOSIS — R319 Hematuria, unspecified: Secondary | ICD-10-CM | POA: Insufficient documentation

## 2021-11-26 DIAGNOSIS — E119 Type 2 diabetes mellitus without complications: Secondary | ICD-10-CM | POA: Insufficient documentation

## 2021-11-26 DIAGNOSIS — R9431 Abnormal electrocardiogram [ECG] [EKG]: Secondary | ICD-10-CM | POA: Diagnosis not present

## 2021-11-26 LAB — URINALYSIS, ROUTINE W REFLEX MICROSCOPIC
Bilirubin Urine: NEGATIVE
Glucose, UA: NEGATIVE mg/dL
Ketones, ur: NEGATIVE mg/dL
Leukocytes,Ua: NEGATIVE
Nitrite: NEGATIVE
Protein, ur: 30 mg/dL — AB
Specific Gravity, Urine: 1.016 (ref 1.005–1.030)
pH: 5 (ref 5.0–8.0)

## 2021-11-26 LAB — ABO/RH: ABO/RH(D): A POS

## 2021-11-26 LAB — TYPE AND SCREEN
ABO/RH(D): A POS
Antibody Screen: NEGATIVE

## 2021-11-26 LAB — COMPREHENSIVE METABOLIC PANEL
ALT: 18 U/L (ref 0–44)
AST: 17 U/L (ref 15–41)
Albumin: 3.9 g/dL (ref 3.5–5.0)
Alkaline Phosphatase: 79 U/L (ref 38–126)
Anion gap: 9 (ref 5–15)
BUN: 15 mg/dL (ref 6–20)
CO2: 23 mmol/L (ref 22–32)
Calcium: 9.2 mg/dL (ref 8.9–10.3)
Chloride: 105 mmol/L (ref 98–111)
Creatinine, Ser: 1.97 mg/dL — ABNORMAL HIGH (ref 0.61–1.24)
GFR, Estimated: 38 mL/min — ABNORMAL LOW (ref >60)
Glucose, Bld: 90 mg/dL (ref 70–99)
Potassium: 5 mmol/L (ref 3.5–5.1)
Sodium: 137 mmol/L (ref 135–145)
Total Bilirubin: 0.2 mg/dL — ABNORMAL LOW (ref 0.3–1.2)
Total Protein: 8 g/dL (ref 6.5–8.1)

## 2021-11-26 LAB — CBC
HCT: 31.4 % — ABNORMAL LOW (ref 39.0–52.0)
Hemoglobin: 10.3 g/dL — ABNORMAL LOW (ref 13.0–17.0)
MCH: 29.4 pg (ref 26.0–34.0)
MCHC: 32.8 g/dL (ref 30.0–36.0)
MCV: 89.7 fL (ref 80.0–100.0)
Platelets: 386 10*3/uL (ref 150–400)
RBC: 3.5 MIL/uL — ABNORMAL LOW (ref 4.22–5.81)
RDW: 16.2 % — ABNORMAL HIGH (ref 11.5–15.5)
WBC: 5.8 10*3/uL (ref 4.0–10.5)
nRBC: 0 % (ref 0.0–0.2)

## 2021-11-26 LAB — LIPASE, BLOOD: Lipase: 47 U/L (ref 11–51)

## 2021-11-26 MED ORDER — IOHEXOL 350 MG/ML SOLN
80.0000 mL | Freq: Once | INTRAVENOUS | Status: AC | PRN
  Administered 2021-11-26: 80 mL via INTRAVENOUS

## 2021-11-26 NOTE — Telephone Encounter (Signed)
Spoke with patient and advised him to go to the ED for blood in urine and stool. Patient verbalized understanding.

## 2021-11-26 NOTE — ED Notes (Signed)
Patient transported to CT 

## 2021-11-26 NOTE — Telephone Encounter (Signed)
Pt returning cma's call, advised pt of provider's 11-23-2021 lab result notes  Pt inquiring if labs showed why he has blood in urine and stool.  Pt requesting a c/b

## 2021-11-26 NOTE — ED Triage Notes (Signed)
Pt c/o hematuria and bright red blood in stools x "a couple months." States he has 8-9 stents, concern for bleeding. Pt went to PCP 11/22/21 for same, labwork drawn, told labwork was abnormal & was advised to come to ED for CT scan.

## 2021-11-26 NOTE — Telephone Encounter (Signed)
Pt is requesting CB for [facility] referral [phone]

## 2021-11-26 NOTE — ED Notes (Signed)
Patient refusing occult blood exam.

## 2021-11-26 NOTE — ED Provider Triage Note (Signed)
Emergency Medicine Provider Triage Evaluation Note  [name] , a 61 y.o. male  was evaluated in triage.  Pt complains of blood in his stool who presents today for dark tarry sticky and red Bms since November.  He reports that his urine has been red around the same time.  That is intermittent.  Physical Exam  BP 115/70 (BP Location: Right Arm)    Pulse 80    Temp 98.2 F (36.8 C) (Oral)    Resp 13    SpO2 100%  Gen:   Awake, no distress   Resp:  Normal effort  MSK:   Moves extremities without difficulty  Other:  Normal speech.   Medical Decision Making  Medically screening exam initiated at 8:41 PM.  Appropriate orders placed.  [name] was informed that the remainder of the evaluation will be completed by another provider, this initial triage assessment does not replace that evaluation, and the importance of remaining in the ED until their evaluation is complete.     [name], [place] 11/26/21 2047

## 2021-11-27 NOTE — ED Provider Notes (Signed)
1:42 AM Discussed imaging results with patient who verbalizes understanding.  No evidence of active or emergent GI bleed on CT scan. H/H is stable. Vitals reassuring.  It appears that the patient's primary care doctor is already in the process of coordinating referral to gastroenterology.  Have recommended that the patient follow-up on the status of this referral.  His creatinine is stable today, but has been slowly worsening since at least December.  The patient reports having an appointment scheduled with nephrology in the coming weeks.  Have encouraged him to keep this appointment.  Return precautions discussed and provided. Patient discharged in stable condition with no unaddressed concerns.  Results for orders placed or performed during the hospital encounter of 11/26/21  Comprehensive metabolic panel  Result Value Ref Range   Sodium 137 135 - 145 mmol/L   Potassium 5.0 3.5 - 5.1 mmol/L   Chloride 105 98 - 111 mmol/L   CO2 23 22 - 32 mmol/L   Glucose, Bld 90 70 - 99 mg/dL   BUN 15 6 - 20 mg/dL   Creatinine, Ser 1.97 (H) 0.61 - 1.24 mg/dL   Calcium 9.2 8.9 - 10.3 mg/dL   Total Protein 8.0 6.5 - 8.1 g/dL   Albumin 3.9 3.5 - 5.0 g/dL   AST 17 15 - 41 U/L   ALT 18 0 - 44 U/L   Alkaline Phosphatase 79 38 - 126 U/L   Total Bilirubin 0.2 (L) 0.3 - 1.2 mg/dL   GFR, Estimated 38 (L) >60 mL/min   Anion gap 9 5 - 15  CBC  Result Value Ref Range   WBC 5.8 4.0 - 10.5 K/uL   RBC 3.50 (L) 4.22 - 5.81 MIL/uL   Hemoglobin 10.3 (L) 13.0 - 17.0 g/dL   HCT 31.4 (L) 39.0 - 52.0 %   MCV 89.7 80.0 - 100.0 fL   MCH 29.4 26.0 - 34.0 pg   MCHC 32.8 30.0 - 36.0 g/dL   RDW 16.2 (H) 11.5 - 15.5 %   Platelets 386 150 - 400 K/uL   nRBC 0.0 0.0 - 0.2 %  Lipase, blood  Result Value Ref Range   Lipase 47 11 - 51 U/L  Urinalysis, Routine w reflex microscopic Urine, Clean Catch  Result Value Ref Range   Color, Urine YELLOW YELLOW   APPearance CLOUDY (A) CLEAR   Specific Gravity, Urine 1.016 1.005 -  1.030   pH 5.0 5.0 - 8.0   Glucose, UA NEGATIVE NEGATIVE mg/dL   Hgb urine dipstick SMALL (A) NEGATIVE   Bilirubin Urine NEGATIVE NEGATIVE   Ketones, ur NEGATIVE NEGATIVE mg/dL   Protein, ur 30 (A) NEGATIVE mg/dL   Nitrite NEGATIVE NEGATIVE   Leukocytes,Ua NEGATIVE NEGATIVE   RBC / HPF 11-20 0 - 5 RBC/hpf   WBC, UA 6-10 0 - 5 WBC/hpf   Bacteria, UA RARE (A) NONE SEEN   Squamous Epithelial / LPF 0-5 0 - 5   Mucus PRESENT    Sperm, UA PRESENT   Type and screen [facility]  Result Value Ref Range   ABO/RH(D) A POS    Antibody Screen NEG    Sample Expiration      11/29/2021,2359 Performed at [facility], [address] [place], [place] [address]   ABO/Rh  Result Value Ref Range   ABO/RH(D)      A POS Performed at [facility] [address] [place], [place] [address]    CT ANGIO GI BLEED  Result  Date: 11/27/2021 CLINICAL DATA:  Mesenteric ischemia, acute. Hematuria. Blood in stool. EXAM: CTA ABDOMEN AND PELVIS WITHOUT AND WITH CONTRAST TECHNIQUE: Multidetector CT imaging of the abdomen and pelvis was performed using the standard protocol during bolus administration of intravenous contrast. Multiplanar reconstructed images and MIPs were obtained and reviewed to evaluate the vascular anatomy. RADIATION DOSE REDUCTION: This exam was performed according to the departmental dose-optimization program which includes automated exposure control, adjustment of the mA and/or kV according to patient size and/or use of iterative reconstruction technique. CONTRAST:  16mL OMNIPAQUE IOHEXOL 350 MG/ML SOLN COMPARISON:  None. FINDINGS: VASCULAR Aorta: Atherosclerotic irregularity with calcified and noncalcified plaque. No aneurysm or dissection. Celiac: Widely patent. SMA: Widely patent. Renals: Widely patent IMA: Widely patent Inflow: Calcifications in the iliac vessels. No aneurysm or dissection. Proximal Outflow: Calcifications in the common femoral arteries, patent.  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 Profundus patent. Veins: No obvious venous abnormality within the limitations of this arterial phase study. Review of the MIP images confirms the above findings. NON-VASCULAR Lower chest: No acute abnormality Hepatobiliary: No focal hepatic abnormality. Gallbladder unremarkable. Pancreas: No focal abnormality or ductal dilatation. Spleen: No focal abnormality.  Normal size. Adrenals/Urinary Tract: Numerous bilateral renal cysts. No hydronephrosis. Adrenal glands and urinary bladder unremarkable. Stomach/Bowel: Scattered colonic diverticulosis. No active extravasation of contrast to localize GI bleed. Stomach and small bowel decompressed. Lymphatic: No adenopathy Reproductive: No visible focal abnormality. Other: No free fluid or free air. Musculoskeletal: No acute bony abnormality. IMPRESSION: VASCULAR Scattered aortic and iliac atherosclerosis. Mesenteric vessels widely patent. No contrast extravasation to localize GI bleed. NON-VASCULAR Scattered colonic diverticulosis.  No active diverticulitis. Numerous bilateral renal cysts. No acute findings in the abdomen or pelvis. Electronically Signed   By: Rolm Baptise M.D.   On: 11/27/2021 00:10      Antonietta Breach, PA-C 42/68/34 1962    Delora Fuel, MD 22/97/98 [phone]

## 2021-11-27 NOTE — ED Provider Notes (Signed)
[facility] EMERGENCY DEPARTMENT Provider Note   CSN: [id number] Arrival date & time: 11/26/21  1825     History  Chief Complaint  Patient presents with   Hematuria   Blood In Stools    [name] is a 61 y.o. male with a past medical history significant for tobacco abuse, multiple MI, heart failure, diabetes, anemia due to chronic illness, ascending aorta dilatation, hematochezia, seizure disorder  who presents with complaint of hematuria, as well as bright red blood per rectum on and off for the last 3 months.  When pressed to ask how often this is occurring patient cannot give further details.  Reports that it is happening more often than weekly, but will not be more specific about the frequency, or amounts of blood occurring when pressed.  Patient denies any history of trauma to the rectum, or penis.  Patient denies feeling fatigued, chest pain, shortness of breath.  Patient does endorse some abdominal pain without nausea, vomiting, diarrhea at this time.   Hematuria      Home Medications Prior to Admission medications   Medication Sig Start Date End Date Taking? Authorizing Provider  carvedilol (COREG) 3.125 MG tablet TAKE 1 TABLET(3.125 MG) BY MOUTH TWICE DAILY WITH A MEAL Patient taking differently: Take 6.25 mg by mouth daily. 10/19/21  Yes Sherren Mocha, MD  Cholecalciferol (VITAMIN D3) 50 MCG (2000 UT) capsule Take 1 capsule (2,000 Units total) by mouth daily. 04/11/21  Yes Biagio Borg, MD  clopidogrel (PLAVIX) 75 MG tablet TAKE 1 TABLET(75 MG) BY MOUTH DAILY Patient taking differently: Take 75 mg by mouth daily. 12/11/20  Yes Weaver, Scott T, PA-C  ezetimibe (ZETIA) 10 MG tablet TAKE 1 TABLET(10 MG) BY MOUTH DAILY Patient taking differently: Take 10 mg by mouth daily. 01/03/21  Yes Sherren Mocha, MD  ferrous sulfate 325 (65 FE) MG tablet Take 1 tablet (325 mg total) by mouth daily with breakfast. 01/16/17  Yes Weaver, Scott T, PA-C  furosemide  (LASIX) 40 MG tablet TAKE 1 TABLET BY MOUTH EVERY DAY AS NEEDED FOR EDEMA(TAKE FOR WEIGHT GAIN OVER 3 POUNDS IN 1 DAY OR LEG SWELLING) Patient taking differently: Take 40 mg by mouth daily as needed for edema (weight gain over 3 pounds in 1 day). 04/11/21  Yes Biagio Borg, MD  HYDROcodone-acetaminophen (NORCO/VICODIN) 5-325 MG tablet Take 1 tablet by mouth every 4 (four) hours as needed. Patient taking differently: Take 1 tablet by mouth every 4 (four) hours as needed for moderate pain. 08/28/21  Yes Upstill, Nehemiah Settle, PA-C  isosorbide mononitrate (IMDUR) 30 MG 24 hr tablet TAKE 1/2 TABLET(15 MG) BY MOUTH DAILY Patient taking differently: Take 15 mg by mouth daily. 10/19/21  Yes Sherren Mocha, MD  levETIRAcetam (KEPPRA) 500 MG tablet Take 1 tablet (500 mg total) by mouth 2 (two) times daily. Patient taking differently: Take 1,000 mg by mouth daily. 03/05/21  Yes Sater, Nanine Means, MD  lisinopril (ZESTRIL) 2.5 MG tablet Take 1 tablet (2.5 mg total) by mouth daily. Please make overdue appt with Dr. Burt Knack before anymore refills. Thank you 1st attempt 11/21/21  Yes Sherren Mocha, MD  Multiple Vitamin (ONE-A-DAY MENS PO) Take 1 tablet by mouth daily.   Yes [provider]  Niacin (VITAMIN B-3 PO) Take 1 tablet by mouth daily.   Yes [provider]  nitroGLYCERIN (NITROSTAT) 0.4 MG SL tablet place 1 tablet under the tongue if needed every 5 minutes for chest pain for 3 doses Patient taking differently:  Place 0.4 mg under the tongue every 5 (five) minutes as needed for chest pain. 08/04/17  Yes Simmons, Brittainy M, PA-C  PHENobarbital (LUMINAL) 32.4 MG tablet Take 1 tablet (32.4 mg total) by mouth 4 (four) times daily. Patient taking differently: Take 129.6 mg by mouth daily. 4 tabs qd 09/19/21  Yes Garvin Fila, MD  rosuvastatin (CRESTOR) 40 MG tablet TAKE 1 TABLET(40 MG) BY MOUTH DAILY Patient taking differently: Take 40 mg by mouth daily. 10/19/21  Yes Sherren Mocha, MD  TURMERIC  PO Take 1 capsule by mouth daily.   Yes [provider]  vitamin B-12 (CYANOCOBALAMIN) 1000 MCG tablet Take 1,000 mcg by mouth daily.   Yes [provider]  zinc gluconate 50 MG tablet Take 50 mg by mouth daily.   Yes [provider]  metFORMIN (GLUCOPHAGE-XR) 500 MG 24 hr tablet Take 1 tablet (500 mg total) by mouth daily with breakfast. Patient not taking: Reported on 11/26/2021 04/12/21   Biagio Borg, MD  methocarbamol (ROBAXIN) 500 MG tablet Take 1 tablet (500 mg total) by mouth 2 (two) times daily. Patient not taking: Reported on 11/26/2021 08/28/21   Charlann Lange, PA-C      Allergies    Spironolactone    Review of Systems   Review of Systems  Gastrointestinal:  Positive for blood in stool.  Genitourinary:  Positive for hematuria.  All other systems reviewed and are negative.  Physical Exam Updated Vital Signs BP 101/65    Pulse 73    Temp 98.3 F (36.8 C) (Oral)    Resp 18    SpO2 99%  Physical Exam Vitals and nursing note reviewed.  Constitutional:      General: He is not in acute distress.    Appearance: Normal appearance.  HENT:     Head: Normocephalic and atraumatic.  Eyes:     General:        Right eye: No discharge.        Left eye: No discharge.  Cardiovascular:     Rate and Rhythm: Normal rate and regular rhythm.     Heart sounds: No murmur heard.   No friction rub. No gallop.  Pulmonary:     Effort: Pulmonary effort is normal.     Breath sounds: Normal breath sounds.  Abdominal:     General: Bowel sounds are normal.     Palpations: Abdomen is soft.     Comments: Patient with tenderness to palpation worse in right upper quadrant, he has some guarding throughout the abdomen.  No rebound, rigidity, distention noted.  Genitourinary:    Comments: Patient declined rectal exam or Hemoccult Skin:    General: Skin is warm and dry.     Capillary Refill: Capillary refill takes less than 2 seconds.  Neurological:     Mental Status: He is  alert and oriented to person, place, and time.  Psychiatric:        Mood and Affect: Mood normal.        Behavior: Behavior normal.    ED Results / Procedures / Treatments   Labs (all labs ordered are listed, but only abnormal results are displayed) Labs Reviewed  COMPREHENSIVE METABOLIC PANEL - Abnormal; Notable for the following components:      Result Value   Creatinine, Ser 1.97 (*)    Total Bilirubin 0.2 (*)    GFR, Estimated 38 (*)    All other components within normal limits  CBC - Abnormal; Notable for the following components:  RBC 3.50 (*)    Hemoglobin 10.3 (*)    HCT 31.4 (*)    RDW 16.2 (*)    All other components within normal limits  URINALYSIS, ROUTINE W REFLEX MICROSCOPIC - Abnormal; Notable for the following components:   APPearance CLOUDY (*)    Hgb urine dipstick SMALL (*)    Protein, ur 30 (*)    Bacteria, UA RARE (*)    All other components within normal limits  LIPASE, BLOOD  POC OCCULT BLOOD, ED  TYPE AND SCREEN  ABO/RH    EKG EKG Interpretation  Date/Time:  Monday November 26 2021 19:42:47 EST Ventricular Rate:  80 PR Interval:  152 QRS Duration: 74 QT Interval:  362 QTC Calculation: 417 R Axis:   9 Text Interpretation: Normal sinus rhythm with sinus arrhythmia ST & T wave abnormality, consider inferior ischemia Abnormal ECG When compared with ECG of 27-Aug-2021 22:11, PREVIOUS ECG IS PRESENT Confirmed by Elnora Morrison [phone]) on 11/26/2021 8:06:41 PM  Radiology CT ANGIO GI BLEED  Result Date: 11/27/2021 CLINICAL DATA:  Mesenteric ischemia, acute. Hematuria. Blood in stool. EXAM: CTA ABDOMEN AND PELVIS WITHOUT AND WITH CONTRAST TECHNIQUE: Multidetector CT imaging of the abdomen and pelvis was performed using the standard protocol during bolus administration of intravenous contrast. Multiplanar reconstructed images and MIPs were obtained and reviewed to evaluate the vascular anatomy. RADIATION DOSE REDUCTION: This exam was performed according to  the departmental dose-optimization program which includes automated exposure control, adjustment of the mA and/or kV according to patient size and/or use of iterative reconstruction technique. CONTRAST:  49mL OMNIPAQUE IOHEXOL 350 MG/ML SOLN COMPARISON:  None. FINDINGS: VASCULAR Aorta: Atherosclerotic irregularity with calcified and noncalcified plaque. No aneurysm or dissection. Celiac: Widely patent. SMA: Widely patent. Renals: Widely patent IMA: Widely patent Inflow: Calcifications in the iliac vessels. No aneurysm or dissection. Proximal Outflow: Calcifications in the common femoral arteries, patent. 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 Profundus patent. Veins: No obvious venous abnormality within the limitations of this arterial phase study. Review of the MIP images confirms the above findings. NON-VASCULAR Lower chest: No acute abnormality Hepatobiliary: No focal hepatic abnormality. Gallbladder unremarkable. Pancreas: No focal abnormality or ductal dilatation. Spleen: No focal abnormality.  Normal size. Adrenals/Urinary Tract: Numerous bilateral renal cysts. No hydronephrosis. Adrenal glands and urinary bladder unremarkable. Stomach/Bowel: Scattered colonic diverticulosis. No active extravasation of contrast to localize GI bleed. Stomach and small bowel decompressed. Lymphatic: No adenopathy Reproductive: No visible focal abnormality. Other: No free fluid or free air. Musculoskeletal: No acute bony abnormality. IMPRESSION: VASCULAR Scattered aortic and iliac atherosclerosis. Mesenteric vessels widely patent. No contrast extravasation to localize GI bleed. NON-VASCULAR Scattered colonic diverticulosis.  No active diverticulitis. Numerous bilateral renal cysts. No acute findings in the abdomen or pelvis. Electronically Signed   By: Rolm Baptise M.D.   On: 11/27/2021 00:10    Procedures Procedures    Medications Ordered in ED Medications  iohexol (OMNIPAQUE) 350 MG/ML injection 80  mL (80 mLs Intravenous Contrast Given 11/26/21 2322)    ED Course/ Medical Decision Making/ A&P Clinical Course as of 11/27/21 0017  Tue Nov 27, 2021  0016 CT findings reviewed.  Negative for acute pathology in the abdomen or pelvis.  No imaging findings to suggest active GI bleed.  Agree with radiologist interpretation. [EX]    Clinical Course User Index [KH] Antonietta Breach, PA-C  Medical Decision Making Amount and/or Complexity of Data Reviewed Labs: ordered. Radiology: ordered.  Risk Prescription drug management.   This patient presents to the ED for concern of acute hematuria, as well as bright red blood per rectum, this involves an extensive number of treatment options, and is a complaint that carries with it a high risk of complications and morbidity. The emergent differential diagnosis includes, but is not limited to Diverticulosis, diverticulitis, acute GI bleed, additionally considered kidney stone, UTI, pyelonephritis, clotting disorder versus other gastroenteritis.  This is not an exhaustive differential.  Co morbidities that complicate the patient evaluation: History of multiple MIs, History of hematochezia, heart failure, diabetes. Social Determinants of Health: patient with history of poor medication compliance  External records from outside source obtained and reviewed including previous ED visits and hospitilizations.  Physical Exam: Physical exam performed. The pertinent findings include: some TTP abdomen, guarding by patient, no frank peritonitic symptoms, rebound, hemorrhage. Patient decline rectal exam or hemoccult testing.  Lab Tests: I Ordered, and personally interpreted labs.  The pertinent results include: Unremarkable urinalysis with Small hemoglobin, rare bacteria.  CMP with significantly elevated creatinine that is similar to patient's baseline.  CBC with mild anemia similar to patient's baseline.  12:26 AM Care of Diesel E Mcanany  transferred to [facility] and Dr. Roxanne Mins at the end of my shift as the patient will require reassessment once labs/imaging have resulted. Patient presentation, ED course, and plan of care discussed with review of all pertinent labs and imaging. Please see his/her note for further details regarding further ED course and disposition. Plan at time of handoff is follow up pending CTA GI bleed study and dispo with probable gastro follow up if no acute findings. This may be altered or completely changed at the discretion of the oncoming team pending results of further workup.  Final Clinical Impression(s) / ED Diagnoses Final diagnoses:  None    Rx / DC Orders ED Discharge Orders     None         Dorien Chihuahua 11/27/21 [id number]    Dorie Rank, MD 11/28/21 [phone]

## 2021-11-27 NOTE — ED Notes (Signed)
Patient refused POC occult blood collection.

## 2021-11-27 NOTE — Discharge Instructions (Addendum)
Your work-up in the emergency department today has been generally reassuring.  Your primary care doctor is in the process of coordinating referral to gastroenterology for further evaluation of your symptoms.  You may require an outpatient colonoscopy by gastroenterologist.  If you do not hear from a GI office in the next week, reach back out to your primary care doctor to determine the status of this referral.  Your kidney function has been slowly worsening since at least December.  It was largely unchanged today compared to your prior visit.  We recommend that you keep your follow-up with nephrology as scheduled for recheck of your kidney function.  Drink plenty of fluids to remain well-hydrated.  Return to the ED for new or concerning symptoms.

## 2021-12-13 DIAGNOSIS — R319 Hematuria, unspecified: Secondary | ICD-10-CM | POA: Diagnosis not present

## 2021-12-13 DIAGNOSIS — N2581 Secondary hyperparathyroidism of renal origin: Secondary | ICD-10-CM | POA: Diagnosis not present

## 2021-12-13 DIAGNOSIS — N183 Chronic kidney disease, stage 3 unspecified: Secondary | ICD-10-CM | POA: Diagnosis not present

## 2021-12-13 DIAGNOSIS — D631 Anemia in chronic kidney disease: Secondary | ICD-10-CM | POA: Diagnosis not present

## 2021-12-13 DIAGNOSIS — N189 Chronic kidney disease, unspecified: Secondary | ICD-10-CM | POA: Diagnosis not present

## 2021-12-13 DIAGNOSIS — N1831 Chronic kidney disease, stage 3a: Secondary | ICD-10-CM | POA: Diagnosis not present

## 2021-12-13 DIAGNOSIS — R809 Proteinuria, unspecified: Secondary | ICD-10-CM | POA: Diagnosis not present

## 2021-12-13 DIAGNOSIS — Q619 Cystic kidney disease, unspecified: Secondary | ICD-10-CM | POA: Diagnosis not present

## 2021-12-25 ENCOUNTER — Other Ambulatory Visit: Payer: Self-pay | Admitting: Cardiovascular Disease

## 2021-12-26 ENCOUNTER — Other Ambulatory Visit: Payer: Self-pay | Admitting: Cardiovascular Disease
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
## 2022-01-14 ENCOUNTER — Ambulatory Visit (INDEPENDENT_AMBULATORY_CARE_PROVIDER_SITE_OTHER): Payer: Medicare Other | Admitting: Nurse Practitioner

## 2022-01-14 ENCOUNTER — Encounter: Payer: Self-pay | Admitting: Nurse Practitioner

## 2022-01-14 ENCOUNTER — Other Ambulatory Visit: Payer: Self-pay

## 2022-01-14 VITALS — BP 100/62 | HR 78 | Ht 74.5 in | Wt 197.0 lb

## 2022-01-14 DIAGNOSIS — I77810 Thoracic aortic ectasia: Secondary | ICD-10-CM

## 2022-01-14 DIAGNOSIS — I2510 Atherosclerotic heart disease of native coronary artery without angina pectoris: Secondary | ICD-10-CM | POA: Diagnosis not present

## 2022-01-14 DIAGNOSIS — I340 Nonrheumatic mitral (valve) insufficiency: Secondary | ICD-10-CM | POA: Diagnosis not present

## 2022-01-14 DIAGNOSIS — E785 Hyperlipidemia, unspecified: Secondary | ICD-10-CM

## 2022-01-14 DIAGNOSIS — I5042 Chronic combined systolic (congestive) and diastolic (congestive) heart failure: Secondary | ICD-10-CM | POA: Diagnosis not present

## 2022-01-14 DIAGNOSIS — I351 Nonrheumatic aortic (valve) insufficiency: Secondary | ICD-10-CM

## 2022-01-14 LAB — BASIC METABOLIC PANEL
BUN/Creatinine Ratio: 11 (ref 9–20)
BUN: 16 mg/dL (ref 6–24)
CO2: 25 mmol/L (ref 20–29)
Calcium: 9.3 mg/dL (ref 8.7–10.2)
Chloride: 105 mmol/L (ref 96–106)
Creatinine, Ser: 1.41 mg/dL — ABNORMAL HIGH (ref 0.76–1.27)
Glucose: 91 mg/dL (ref 70–99)
Potassium: 4.7 mmol/L (ref 3.5–5.2)
Sodium: 140 mmol/L (ref 134–144)
eGFR: 57 mL/min/{1.73_m2} — ABNORMAL LOW (ref >59)

## 2022-01-14 MED ORDER — EZETIMIBE 10 MG PO TABS
10.0000 mg | ORAL_TABLET | Freq: Every day | ORAL | 3 refills | Status: DC
Start: 2022-01-14 — End: 2023-01-06

## 2022-01-14 MED ORDER — NITROGLYCERIN 0.4 MG SL SUBL: SUBLINGUAL_TABLET | SUBLINGUAL | 3 refills | Status: DC

## 2022-01-14 MED ORDER — ROSUVASTATIN CALCIUM 40 MG PO TABS: ORAL_TABLET | ORAL | 3 refills | Status: DC

## 2022-01-14 MED ORDER — NITROGLYCERIN 0.4 MG SL SUBL: 0.4000 mg | SUBLINGUAL_TABLET | SUBLINGUAL | 3 refills | Status: DC | PRN

## 2022-01-14 NOTE — Patient Instructions (Signed)
Medication Instructions:  ? ?Your physician recommends that you continue on your current medications as directed. Please refer to the Current Medication list given to you today. ? ? ?*If you need a refill on your cardiac medications before your next appointment, please call your pharmacy* ? ? ?Lab Work: ? ?TODAY!!!!!  BMET ? ?If you have labs (blood work) drawn today and your tests are completely normal, you will receive your results only by: ?MyChart Message (if you have MyChart) OR ?A paper copy in the mail ?If you have any lab test that is abnormal or we need to change your treatment, we will call you to review the results. ? ? ?Follow-Up: ?At [facility], you and your health needs are our priority.  As part of our continuing mission to provide you with exceptional heart care, we have created designated Provider Care Teams.  These Care Teams include your primary Cardiologist (physician) and Advanced Practice Providers (APPs -  Physician Assistants and Nurse Practitioners) who all work together to provide you with the care you need, when you need it. ? ?We recommend signing up for the patient portal called "MyChart".  Sign up information is provided on this After Visit Summary.  MyChart is used to connect with patients for Virtual Visits (Telemedicine).  Patients are able to view lab/test results, encounter notes, upcoming appointments, etc.  Non-urgent messages can be sent to your provider as well.   ?To learn more about what you can do with MyChart, go to [email or web address].   ? ?Your next appointment:   ?6 month(s) ? ?The format for your next appointment:   ?In Person ? ?Provider:   ?Sherren Mocha, MD   ? ? ?

## 2022-01-16 ENCOUNTER — Other Ambulatory Visit: Payer: Self-pay | Admitting: Cardiovascular Disease

## 2022-02-14 ENCOUNTER — Other Ambulatory Visit: Payer: Self-pay | Admitting: Physician Assistant

## 2022-02-14 DIAGNOSIS — I214 Non-ST elevation (NSTEMI) myocardial infarction: Secondary | ICD-10-CM

## 2022-02-14 DIAGNOSIS — I340 Nonrheumatic mitral (valve) insufficiency: Secondary | ICD-10-CM

## 2022-02-14 DIAGNOSIS — Z91199 Patient's noncompliance with other medical treatment and regimen due to unspecified reason: Secondary | ICD-10-CM

## 2022-02-14 DIAGNOSIS — I25118 Atherosclerotic heart disease of native coronary artery with other forms of angina pectoris: Secondary | ICD-10-CM

## 2022-02-14 DIAGNOSIS — I10 Essential (primary) hypertension: Secondary | ICD-10-CM

## 2022-02-20 ENCOUNTER — Encounter: Payer: Self-pay | Admitting: Neurology

## 2022-02-20 ENCOUNTER — Ambulatory Visit (INDEPENDENT_AMBULATORY_CARE_PROVIDER_SITE_OTHER): Payer: Medicare Other | Admitting: Neurology

## 2022-02-20 VITALS — BP 107/65 | HR 64 | Ht 74.0 in | Wt 202.8 lb

## 2022-02-20 DIAGNOSIS — R569 Unspecified convulsions: Secondary | ICD-10-CM | POA: Diagnosis not present

## 2022-02-20 MED ORDER — LEVETIRACETAM 250 MG PO TABS: 250.0000 mg | ORAL_TABLET | Freq: Two times a day (BID) | ORAL | 11 refills | Status: DC

## 2022-02-20 NOTE — Patient Instructions (Signed)
I had a long discussion with the patient regarding his remote seizures and has done extremely well and has been seizure-free since October 2015.  I recommend we taper Keppra to 250 mg twice daily and continue phenobarbital and the current dosage of 32.4 mg 4 times a day.  He was advised to avoid seizure provoking stimuli like abrupt medication discontinuation, skin deprivation, irregular eating and sleeping habits and extremes of exertion and alcohol and stimulants.  He will return for follow-up in the future in a year or call earlier if necessary. ?
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
## 2022-02-28 DIAGNOSIS — N2581 Secondary hyperparathyroidism of renal origin: Secondary | ICD-10-CM | POA: Diagnosis not present

## 2022-02-28 DIAGNOSIS — R809 Proteinuria, unspecified: Secondary | ICD-10-CM | POA: Diagnosis not present

## 2022-02-28 DIAGNOSIS — Q619 Cystic kidney disease, unspecified: Secondary | ICD-10-CM | POA: Diagnosis not present

## 2022-02-28 DIAGNOSIS — D631 Anemia in chronic kidney disease: Secondary | ICD-10-CM | POA: Diagnosis not present

## 2022-02-28 DIAGNOSIS — N183 Chronic kidney disease, stage 3 unspecified: Secondary | ICD-10-CM | POA: Diagnosis not present

## 2022-02-28 DIAGNOSIS — R319 Hematuria, unspecified: Secondary | ICD-10-CM | POA: Diagnosis not present

## 2022-03-04 ENCOUNTER — Other Ambulatory Visit: Payer: Self-pay | Admitting: Nephrology

## 2022-03-04 DIAGNOSIS — Q619 Cystic kidney disease, unspecified: Secondary | ICD-10-CM

## 2022-03-13 ENCOUNTER — Encounter: Payer: Self-pay | Admitting: Internal Medicine

## 2022-03-26 ENCOUNTER — Encounter: Payer: Self-pay | Admitting: Gastroenterology

## 2022-03-28 DIAGNOSIS — R310 Gross hematuria: Secondary | ICD-10-CM | POA: Diagnosis not present

## 2022-04-05 ENCOUNTER — Other Ambulatory Visit: Payer: Self-pay | Admitting: Neurology

## 2022-04-06 ENCOUNTER — Ambulatory Visit
Admission: RE | Admit: 2022-04-06 | Discharge: 2022-04-06 | Disposition: A | Discharge status: Home / Self Care | Payer: Medicare Other | Source: Ambulatory Visit | Attending: Nephrology | Admitting: Nephrology

## 2022-04-06 DIAGNOSIS — Q619 Cystic kidney disease, unspecified: Secondary | ICD-10-CM | POA: Diagnosis not present

## 2022-04-06 MED ORDER — GADOBENATE DIMEGLUMINE 529 MG/ML IV SOLN
20.0000 mL | Freq: Once | INTRAVENOUS | Status: AC | PRN
  Administered 2022-04-06: 20 mL via INTRAVENOUS

## 2022-04-17 ENCOUNTER — Ambulatory Visit: Payer: Medicare Other | Admitting: Internal Medicine

## 2022-04-22 ENCOUNTER — Ambulatory Visit (INDEPENDENT_AMBULATORY_CARE_PROVIDER_SITE_OTHER): Payer: Medicare Other | Admitting: Internal Medicine

## 2022-04-22 ENCOUNTER — Encounter: Payer: Self-pay | Admitting: Internal Medicine

## 2022-04-22 VITALS — BP 118/60 | HR 83 | Temp 98.3°F | Ht 74.0 in | Wt 199.4 lb

## 2022-04-22 DIAGNOSIS — Z0001 Encounter for general adult medical examination with abnormal findings: Secondary | ICD-10-CM

## 2022-04-22 DIAGNOSIS — E559 Vitamin D deficiency, unspecified: Secondary | ICD-10-CM

## 2022-04-22 DIAGNOSIS — I10 Essential (primary) hypertension: Secondary | ICD-10-CM

## 2022-04-22 DIAGNOSIS — E7800 Pure hypercholesterolemia, unspecified: Secondary | ICD-10-CM | POA: Diagnosis not present

## 2022-04-22 DIAGNOSIS — Z125 Encounter for screening for malignant neoplasm of prostate: Secondary | ICD-10-CM

## 2022-04-22 DIAGNOSIS — E538 Deficiency of other specified B group vitamins: Secondary | ICD-10-CM

## 2022-04-22 DIAGNOSIS — H6123 Impacted cerumen, bilateral: Secondary | ICD-10-CM | POA: Diagnosis not present

## 2022-04-22 DIAGNOSIS — E1165 Type 2 diabetes mellitus with hyperglycemia: Secondary | ICD-10-CM

## 2022-04-22 NOTE — Progress Notes (Signed)
PRE-PROCEDURE EXAM:  Bilateral TMs cannot be visualized due to total occlusion/impaction of the ear canal.  PROCEDURE INDICATION: remove ear wax to visualize ear drums & relieve discomfort  CONSENT:  Verbal     PROCEDURE NOTE:     EAR:  I used a plastic wax curette under direct vision with an otoscope to free the wax bolus from the ear wall and then successfully removed a small bit of wax. The ears was then irrigated with warm water to remove the remaining wax.  POST- PROCEDURE EXAM: TMs successfully visualized and found to have no erythema     The patient tolerated the procedure well.

## 2022-04-22 NOTE — Assessment & Plan Note (Signed)
BP Readings from Last 3 Encounters:  04/22/22 118/60  02/20/22 107/65  01/14/22 100/62   Stable, pt to continue medical treatment coreg 3.125 mg qd

## 2022-04-22 NOTE — Assessment & Plan Note (Signed)
Bilateral ear wax impactions resolved with irrigation and hearing improved  Ceruminosis is noted.  Wax is removed by syringing and manual debridement. Instructions for home care to prevent wax buildup are given.

## 2022-04-22 NOTE — Progress Notes (Signed)
Patient ID: [name], male   DOB: 28-Jan-1961, 61 y.o.   MRN: [id number]         Chief Complaint:: wellness exam and bilateral hearing loss,        HPI:  [name] is a 61 y.o. male here for wellness exam; declines covid booster, shingrix, and eye exam for now - plans to call himself, o/w up to date                        Also has reduced hearing bilateral worse with the shower water, without HA, fever, ear d/c or ST, cough.  Pt denies chest pain, increased sob or doe, wheezing, orthopnea, PND, increased LE swelling, palpitations, dizziness or syncope.   Pt denies polydipsia, polyuria, or new focal neuro s/s.    Pt denies fever, wt loss, night sweats, loss of appetite, or other constitutional symptoms    Wt Readings from Last 3 Encounters:  04/22/22 199 lb 6 oz (90.4 kg)  02/20/22 202 lb 12.8 oz (92 kg)  01/14/22 197 lb (89.4 kg)   BP Readings from Last 3 Encounters:  04/22/22 118/60  02/20/22 107/65  01/14/22 100/62   Immunization History  Administered Date(s) Administered   Influenza,inj,Quad PF,6+ Mos 07/16/2019, 10/11/2020   Janssen (J&J) SARS-COV-2 Vaccination 12/28/2019   Tdap 04/12/2019  There are no preventive care reminders to display for this patient.    Past Medical History:  Diagnosis Date   Anemia 01/01/2017   Ascending aorta dilation (HCC)    Echocardiogram 2/22: EF 40-45, mild LVH, normal RVSF, mild to mod MR, mild to mod AI, ascending aorta 45 mm.     CAD (coronary artery disease)    a. Overlapping BMS-prox LCx in 2009 b. NSTEMI 2012 due to prox LCx ISR s/p PTCA alone; EF 45% w/ basal inf HK. Had run out of Plavix 1 week prior. c. NSTEMI 05/2015 after running out of Plavix - cath with 3V CAD, Patient left AMA prior to agreeing to CABG. // d. LHC 8/16: LM 60; LAD mid 60, D1 75; RI 50, 80; LCx prox stent 100; RCA prox 100; EF 45-50 >> seen by [facility] - declined CABG >> Med Rx.   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   Hypertension    MI (myocardial infarction) ([place]) x 7   Poor compliance    Seizure disorder ([place])    Seizures ([place])    TOBACCO ABUSE 02/22/2009   Valvular heart disease    a. Echo 05/2015: mild AI, mod MR.   Past Surgical History:  Procedure Laterality Date   CARDIAC CATHETERIZATION [name] 05/29/2015   Procedure: Left Heart Cath and Coronary Angiography;  Surgeon: Belva Crome, MD;  Location: [facility];  Service: Cardiovascular;  Laterality: [name];   COLONOSCOPY  CORONARY ANGIOPLASTY  03/2011   PTCA-prox LCx ISR   CORONARY ANGIOPLASTY WITH STENT PLACEMENT  10/2007   BMS-prox LCx   LEFT HEART CATHETERIZATION WITH CORONARY ANGIOGRAM [name] 10/13/2013   Procedure: LEFT HEART CATHETERIZATION WITH CORONARY ANGIOGRAM;  Surgeon: Blane Ohara, MD;  Location: [facility] CATH LAB;  Service: Cardiovascular;  Laterality: [name];   TEE WITHOUT CARDIOVERSION [name] 06/16/2015   Procedure: TRANSESOPHAGEAL ECHOCARDIOGRAM (TEE);  Surgeon: Josue Hector, MD;  Location: [facility] ENDOSCOPY;  Service: Cardiovascular;  Laterality: [name];    reports that he has quit smoking. His smoking use included cigars. He has never used smokeless tobacco. He reports that he does not drink alcohol and does not use drugs. family history includes Coronary artery disease in his maternal aunt; Heart attack in his maternal uncle; Heart disease in his maternal uncle; Hypertension in an other family member; Stroke in his maternal uncle. Allergies  Allergen  Reactions   Spironolactone Diarrhea   Current Outpatient Medications on File Prior to Visit  Medication Sig Dispense Refill   carvedilol (COREG) 3.125 MG tablet TAKE 1 TABLET(3.125 MG) BY MOUTH TWICE DAILY WITH A MEAL 180 tablet 2   Cholecalciferol (VITAMIN D3) 50 MCG (2000 UT) capsule Take 1 capsule (2,000 Units total) by mouth daily. 90 capsule 99   clopidogrel (PLAVIX) 75 MG tablet TAKE 1 TABLET(75 MG) BY MOUTH DAILY 90 tablet 3   ezetimibe (ZETIA) 10 MG tablet Take 1 tablet (10 mg total) by mouth daily. 90 tablet 3   ferrous sulfate 325 (65 FE) MG tablet Take 1 tablet (325 mg total) by mouth daily with breakfast. 90 tablet 3   Flaxseed Oil OIL by Does not apply route.     furosemide (LASIX) 40 MG tablet TAKE 1 TABLET BY MOUTH EVERY DAY AS NEEDED FOR EDEMA(TAKE FOR WEIGHT GAIN OVER 3 POUNDS IN 1 DAY OR LEG SWELLING) 90 tablet 3   HYDROcodone-acetaminophen (NORCO/VICODIN) 5-325 MG tablet Take 1 tablet by mouth every 4 (four) hours as needed. 10 tablet 0   isosorbide mononitrate (IMDUR) 30 MG 24 hr tablet TAKE 1/2 TABLET(15 MG) BY MOUTH DAILY (Patient taking differently: Take 15 mg by mouth daily.) 45 tablet 3   levETIRAcetam (KEPPRA) 250 MG tablet Take 1 tablet (250 mg total) by mouth 2 (two) times daily. 60 tablet 11   lisinopril (ZESTRIL) 2.5 MG tablet TAKE 1 TABLET(2.5 MG) BY MOUTH DAILY 30 tablet 11   methocarbamol (ROBAXIN) 500 MG tablet Take 1 tablet (500 mg total) by mouth 2 (two) times daily. 20 tablet 0   Multiple Vitamin (ONE-A-DAY MENS PO) Take 1 tablet by mouth daily.     Niacin (VITAMIN B-3 PO) Take 1 tablet by mouth daily.     nitroGLYCERIN (NITROSTAT) 0.4 MG SL tablet Place 1 tablet (0.4 mg total) under the tongue every 5 (five) minutes as needed for chest pain. place 1 tablet under the tongue if needed every 5 minutes for chest pain for 3 doses Strength: 0.4 mg 25 tablet 3   PHENobarbital (LUMINAL) 32.4 MG tablet Take 1 tablet (32.4 mg total) by mouth 4 (four) times daily.  (Patient taking differently: Take 129.6 mg by mouth daily. 4 tabs qd) 360 tablet 3   rosuvastatin (CRESTOR) 40 MG tablet TAKE 1 TABLET(40 MG) BY MOUTH DAILY Strength: 40 mg 90 tablet 3   TURMERIC PO Take 1 capsule by mouth daily.     vitamin B-12 (CYANOCOBALAMIN) 1000 MCG tablet Take 1,000 mcg by mouth daily.     zinc gluconate  50 MG tablet Take 50 mg by mouth daily.     No current facility-administered medications on file prior to visit.        ROS:  All others reviewed and negative.  Objective        PE:  BP 118/60   Pulse 83   Temp 98.3 F (36.8 C) (Oral)   Ht '6\' 2"'$  (1.88 m)   Wt 199 lb 6 oz (90.4 kg)   SpO2 96%   BMI 25.60 kg/m                 Constitutional: Pt appears in NAD               HENT: Head: NCAT.                Right Ear: External ear normal.                 Left Ear: External ear normal.  Bilateral ear wax impactions resolved with irrigation and hearing improved               Eyes: . Pupils are equal, round, and reactive to light. Conjunctivae and EOM are normal               Nose: without d/c or deformity               Neck: Neck supple. Gross normal ROM               Cardiovascular: Normal rate and regular rhythm.                 Pulmonary/Chest: Effort normal and breath sounds without rales or wheezing.                Abd:  Soft, NT, ND, + BS, no organomegaly               Neurological: Pt is alert. At baseline orientation, motor grossly intact               Skin: Skin is warm. No rashes, no other new lesions, LE edema - none               Psychiatric: Pt behavior is normal without agitation   Micro: none  Cardiac tracings I have personally interpreted today:  none  Pertinent Radiological findings (summarize): none   Lab Results  Component Value Date   WBC 5.8 11/26/2021   HGB 10.3 (L) 11/26/2021   HCT 31.4 (L) 11/26/2021   PLT 386 11/26/2021   GLUCOSE 91 01/14/2022   CHOL 107 11/22/2021   TRIG 52.0 11/22/2021   HDL 31.10 (L) 11/22/2021    LDLCALC 65 11/22/2021   ALT 18 11/26/2021   AST 17 11/26/2021   NA 140 01/14/2022   K 4.7 01/14/2022   CL 105 01/14/2022   CREATININE 1.41 (H) 01/14/2022   BUN 16 01/14/2022   CO2 25 01/14/2022   TSH 1.27 04/11/2021   PSA 1.75 04/11/2021   INR 1.01 05/27/2015   HGBA1C 6.4 11/22/2021   MICROALBUR 0.8 04/11/2020   Assessment/Plan:  [name] is a 61 y.o. Black or African American [2] male with  has a past medical history of Anemia (01/01/2017), Ascending aorta dilation ([place]), CAD (coronary artery disease), Clotting disorder ([place]), Diabetes mellitus without complication ([place]), HFmrEF (heart failure with mildy reduced EF) (06/06/2015), HLD (hyperlipidemia), Hyperglycemia (A1C 6.0 05/2015.), Hypertension, MI (myocardial infarction) ([place]) (x 7), Poor compliance, Seizure disorder ([place]), Seizures (  [place]), TOBACCO ABUSE (02/22/2009), and Valvular heart disease.  Vitamin D deficiency Last vitamin D Lab Results  Component Value Date   VD25OH 42.40 04/11/2021   Stable, cont oral replacement  Encounter for well adult exam with abnormal findings Age and sex appropriate education and counseling updated with regular exercise and diet Referrals for preventative services - none needed Immunizations addressed - declines covid booster, shingrix Smoking counseling  - none needed Evidence for depression or other mood disorder - none significant Most recent labs reviewed. I have personally reviewed and have noted: 1) the patient's medical and social history 2) The patient's current medications and supplements 3) The patient's height, weight, and BMI have been recorded in the chart   Pure hypercholesterolemia Lab Results  Component Value Date   LDLCALC 65 11/22/2021   Stable, pt to continue current statin crestor 40 mg qd, and zetia 10 mg qd   Essential hypertension BP Readings from Last 3 Encounters:  04/22/22 118/60  02/20/22 107/65  01/14/22 100/62   Stable, pt to continue medical  treatment coreg 3.125 mg qd   Diabetes ([place]) Lab Results  Component Value Date   HGBA1C 6.4 11/22/2021   Stable, pt to continue current medical treatment  - diet, wt control, excercise   Bilateral impacted cerumen Bilateral ear wax impactions resolved with irrigation and hearing improved  Ceruminosis is noted.  Wax is removed by syringing and manual debridement. Instructions for home care to prevent wax buildup are given.  Followup: Return in about 6 months (around 10/23/2022).  Cathlean Cower, MD 04/22/2022 8:33 PM [facility]

## 2022-04-22 NOTE — Assessment & Plan Note (Signed)
Lab Results  Component Value Date   LDLCALC 65 11/22/2021   Stable, pt to continue current statin crestor 40 mg qd, and zetia 10 mg qd

## 2022-04-22 NOTE — Assessment & Plan Note (Signed)
Last vitamin D Lab Results  Component Value Date   VD25OH 42.40 04/11/2021   Stable, cont oral replacement

## 2022-04-22 NOTE — Assessment & Plan Note (Signed)
Lab Results  Component Value Date   HGBA1C 6.4 11/22/2021   Stable, pt to continue current medical treatment  - diet, wt control, excercise

## 2022-04-22 NOTE — Assessment & Plan Note (Signed)

## 2022-04-22 NOTE — Patient Instructions (Signed)
Your ears were irrigated of wax today  Please continue all other medications as before, and refills have been done if requested.  Please have the pharmacy call with any other refills you may need.  Please continue your efforts at being more active, low cholesterol diet, and weight control.  You are otherwise up to date with prevention measures today.  Please keep your appointments with your specialists as you may have planned  Please go to the LAB at the blood drawing area for the tests to be done  You will be contacted by phone if any changes need to be made immediately.  Otherwise, you will receive a letter about your results with an explanation, but please check with MyChart first.  Please remember to sign up for MyChart if you have not done so, as this will be important to you in the future with finding out test results, communicating by private email, and scheduling acute appointments online when needed.  Please make an Appointment to return in 6 months, or sooner if needed

## 2022-04-23 LAB — HEPATIC FUNCTION PANEL
AG Ratio: 1.4 (calc) (ref 1.0–2.5)
ALT: 29 U/L (ref 9–46)
AST: 23 U/L (ref 10–35)
Albumin: 4.7 g/dL (ref 3.6–5.1)
Alkaline phosphatase (APISO): 79 U/L (ref 35–144)
Bilirubin, Direct: 0.1 mg/dL (ref 0.0–0.2)
Globulin: 3.3 g/dL (calc) (ref 1.9–3.7)
Indirect Bilirubin: 0.2 mg/dL (calc) (ref 0.2–1.2)
Total Bilirubin: 0.3 mg/dL (ref 0.2–1.2)
Total Protein: 8 g/dL (ref 6.1–8.1)

## 2022-04-23 LAB — CBC WITH DIFFERENTIAL/PLATELET
Absolute Monocytes: 710 cells/uL (ref 200–950)
Basophils Absolute: 67 cells/uL (ref 0–200)
Basophils Relative: 0.9 %
Eosinophils Absolute: 274 cells/uL (ref 15–500)
Eosinophils Relative: 3.7 %
HCT: 33.5 % — ABNORMAL LOW (ref 38.5–50.0)
Hemoglobin: 11.3 g/dL — ABNORMAL LOW (ref 13.2–17.1)
Lymphs Abs: 2072 cells/uL (ref 850–3900)
MCH: 30.1 pg (ref 27.0–33.0)
MCHC: 33.7 g/dL (ref 32.0–36.0)
MCV: 89.1 fL (ref 80.0–100.0)
MPV: 8.6 fL (ref 7.5–12.5)
Monocytes Relative: 9.6 %
Neutro Abs: 4277 cells/uL (ref 1500–7800)
Neutrophils Relative %: 57.8 %
Platelets: 363 10*3/uL (ref 140–400)
RBC: 3.76 10*6/uL — ABNORMAL LOW (ref 4.20–5.80)
RDW: 15.1 % — ABNORMAL HIGH (ref 11.0–15.0)
Total Lymphocyte: 28 %
WBC: 7.4 10*3/uL (ref 3.8–10.8)

## 2022-04-23 LAB — BASIC METABOLIC PANEL
BUN/Creatinine Ratio: 12 (calc) (ref 6–22)
BUN: 20 mg/dL (ref 7–25)
CO2: 26 mmol/L (ref 20–32)
Calcium: 9.6 mg/dL (ref 8.6–10.3)
Chloride: 110 mmol/L (ref 98–110)
Creat: 1.62 mg/dL — ABNORMAL HIGH (ref 0.70–1.35)
Glucose, Bld: 91 mg/dL (ref 65–99)
Potassium: 4.3 mmol/L (ref 3.5–5.3)
Sodium: 140 mmol/L (ref 135–146)

## 2022-04-23 LAB — LIPID PANEL
Cholesterol: 130 mg/dL (ref <200)
HDL: 37 mg/dL — ABNORMAL LOW (ref >=40)
LDL Cholesterol (Calc): 74 mg/dL (calc)
Non-HDL Cholesterol (Calc): 93 mg/dL (calc) (ref <130)
Total CHOL/HDL Ratio: 3.5 (calc) (ref <5.0)
Triglycerides: 104 mg/dL (ref <150)

## 2022-04-23 LAB — MICROALBUMIN / CREATININE URINE RATIO

## 2022-04-23 LAB — HEMOGLOBIN A1C
Hgb A1c MFr Bld: 5.6 % of total Hgb (ref <5.7)
Mean Plasma Glucose: 114 mg/dL
eAG (mmol/L): 6.3 mmol/L

## 2022-04-23 LAB — PSA: PSA: 1.69 ng/mL (ref <=4.00)

## 2022-04-23 LAB — VITAMIN B12: Vitamin B-12: 597 pg/mL (ref 200–1100)

## 2022-04-23 LAB — VITAMIN D 25 HYDROXY (VIT D DEFICIENCY, FRACTURES): Vit D, 25-Hydroxy: 43 ng/mL (ref 30–100)

## 2022-04-23 LAB — TSH: TSH: 1.31 mIU/L (ref 0.40–4.50)

## 2022-04-23 LAB — URINALYSIS, ROUTINE W REFLEX MICROSCOPIC

## 2022-04-24 DIAGNOSIS — Z0001 Encounter for general adult medical examination with abnormal findings: Secondary | ICD-10-CM | POA: Diagnosis not present

## 2022-04-24 NOTE — Addendum Note (Signed)
Addended by: Boris Lown B on: 04/24/2022 09:10 AM   Modules accepted: Orders

## 2022-04-25 LAB — MICROALBUMIN / CREATININE URINE RATIO
Creatinine, Urine: 292 mg/dL (ref 20–320)
Microalb Creat Ratio: 2 mcg/mg creat (ref <30)
Microalb, Ur: 0.7 mg/dL

## 2022-04-25 LAB — URINALYSIS, ROUTINE W REFLEX MICROSCOPIC
Bacteria, UA: NONE SEEN /HPF
Bilirubin Urine: NEGATIVE
Glucose, UA: NEGATIVE
Hgb urine dipstick: NEGATIVE
Hyaline Cast: NONE SEEN /LPF
Ketones, ur: NEGATIVE
Leukocytes,Ua: NEGATIVE
Nitrite: NEGATIVE
Specific Gravity, Urine: 1.024 (ref 1.001–1.035)
Squamous Epithelial / HPF: NONE SEEN /HPF (ref <=5)
pH: 5.5 (ref 5.0–8.0)

## 2022-04-29 ENCOUNTER — Other Ambulatory Visit: Payer: Self-pay

## 2022-04-29 MED ORDER — NITROGLYCERIN 0.4 MG SL SUBL: 0.4000 mg | SUBLINGUAL_TABLET | SUBLINGUAL | 8 refills | Status: DC | PRN

## 2022-04-30 ENCOUNTER — Ambulatory Visit: Payer: Medicare Other | Admitting: Gastroenterology

## 2022-06-11 ENCOUNTER — Ambulatory Visit: Payer: Medicare Other | Admitting: Gastroenterology

## 2022-06-18 ENCOUNTER — Other Ambulatory Visit: Payer: Self-pay | Admitting: Neurology

## 2022-06-20 NOTE — Telephone Encounter (Signed)
Verify Drug Registry For Phenobarbital 32.4 Mg Tablet Last Filled: 03/19/2022 Quantity: 360 tablets for 90 days Last appointment: 02/20/2022 Next appointment: 03/05/2023

## 2022-07-08 ENCOUNTER — Other Ambulatory Visit: Payer: Self-pay | Admitting: Cardiovascular Disease

## 2022-07-08 DIAGNOSIS — I5020 Unspecified systolic (congestive) heart failure: Secondary | ICD-10-CM

## 2022-07-18 ENCOUNTER — Ambulatory Visit: Payer: Medicare Other | Admitting: Cardiovascular Disease

## 2022-07-23 ENCOUNTER — Ambulatory Visit: Payer: Medicare Other | Attending: Cardiovascular Disease | Admitting: Cardiovascular Disease

## 2022-07-23 ENCOUNTER — Ambulatory Visit: Payer: Medicare Other | Admitting: Cardiovascular Disease

## 2022-07-23 ENCOUNTER — Encounter: Payer: Self-pay | Admitting: Cardiovascular Disease

## 2022-07-23 VITALS — BP 130/72 | HR 81 | Ht 74.0 in | Wt 213.0 lb

## 2022-07-23 DIAGNOSIS — I2510 Atherosclerotic heart disease of native coronary artery without angina pectoris: Secondary | ICD-10-CM

## 2022-07-23 DIAGNOSIS — I10 Essential (primary) hypertension: Secondary | ICD-10-CM

## 2022-07-23 DIAGNOSIS — I5020 Unspecified systolic (congestive) heart failure: Secondary | ICD-10-CM

## 2022-07-23 DIAGNOSIS — E782 Mixed hyperlipidemia: Secondary | ICD-10-CM

## 2022-07-23 DIAGNOSIS — I340 Nonrheumatic mitral (valve) insufficiency: Secondary | ICD-10-CM

## 2022-07-23 NOTE — Patient Instructions (Signed)
Medication Instructions:  Your physician recommends that you continue on your current medications as directed. Please refer to the Current Medication list given to you today.  *If you need a refill on your cardiac medications before your next appointment, please call your pharmacy*   Lab Work: NONE If you have labs (blood work) drawn today and your tests are completely normal, you will receive your results only by: [place] (if you have MyChart) OR A paper copy in the mail If you have any lab test that is abnormal or we need to change your treatment, we will call you to review the results.   Testing/Procedures: NONE   Follow-Up: At [facility], you and your health needs are our priority.  As part of our continuing mission to provide you with exceptional heart care, we have created designated Provider Care Teams.  These Care Teams include your primary Cardiologist (physician) and Advanced Practice Providers (APPs -  Physician Assistants and Nurse Practitioners) who all work together to provide you with the care you need, when you need it.  We recommend signing up for the patient portal called "MyChart".  Sign up information is provided on this After Visit Summary.  MyChart is used to connect with patients for Virtual Visits (Telemedicine).  Patients are able to view lab/test results, encounter notes, upcoming appointments, etc.  Non-urgent messages can be sent to your provider as well.   To learn more about what you can do with MyChart, go to [email or web address].    Your next appointment:   1 year(s)  The format for your next appointment:   In Person  Provider:  Dr. Burt Knack   Other Instructions   Important Information About Sugar

## 2022-07-23 NOTE — Progress Notes (Signed)
Cardiology Office Note:    Date:  07/23/2022   ID:  [name], DOB 08-11-1961, MRN [id number]  PCP:  Biagio Borg, MD   [place] [facility] Providers Cardiologist:  Sherren Mocha, MD Cardiology APP:  Sharmon Revere     Referring MD: Biagio Borg, MD   Chief Complaint  Patient presents with   Follow-up    History of Present Illness:    [name] is a 61 y.o. male with a hx of: Coronary artery disease S/p BMS x2 to the proximal LCx in 2009 NSTEMI in 2012 2/2 ISR >> PCI: POBA to LCx NSTEMI in 2016>> cath: RCA 100, LCx 100, mod disease in LM and LAD - Pt declined CABG>>Med Rx Ischemic cardiomyopathy Heart failure with mildly reduced ejection fraction (HFmrEF) Echocardiogram 4/19: EF 40-45 Moderate mitral regurgitation  Hypertension Hyperlipidemia Seizure disorder Self-reported history of clotting disorder Tobacco use Nonadherence  The patient is here alone today.  I have not seen him in many years but he is followed in our practice with Richardson Dopp.  He is doing well with no symptoms of chest pain, chest pressure, shortness of breath, leg swelling, or heart palpitations.  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  He reports good compliance with his medications.  He quit smoking about 1 year ago.  He denies any anginal symptoms.  Past Medical History:  Diagnosis Date   Anemia 01/01/2017   Ascending aorta dilation (HCC)    Echocardiogram 2/22: EF 40-45, mild LVH, normal RVSF, mild to mod MR, mild to mod AI, ascending aorta 45 mm.     CAD (coronary artery disease)    a. Overlapping BMS-prox LCx in 2009 b. NSTEMI 2012 due to prox LCx ISR s/p PTCA alone; EF 45% w/ basal inf HK. Had run out of Plavix 1 week prior. c. NSTEMI 05/2015 after running out of Plavix - cath with 3V CAD, Patient left AMA prior to agreeing to CABG. // d. LHC 8/16: LM 60; LAD mid 60, D1 75; RI 50, 80; LCx prox stent 100; RCA  prox 100; EF 45-50 >> seen by [facility] - declined CABG >> Med Rx.   Clotting disorder ([place])    Diabetes mellitus without complication ([place])    HFmrEF (heart failure with mildy reduced EF) 06/06/2015   a - Echo 8/16:  EF 40-45, inf-lat AK, Gr 2 diastolic dysfunction, mild AI, mod MR // b - TEE 8/16: EF 45-50, inf HK, mod MR, mild TR // c - Echo 4/18: EF 40-45, ant-lat and inf-lat HK, Gr 2 DD, mild AI, mild to mod MR, severe LAE, PASP 29 // d - Echo 4/19: Mild concentric LVH, EF 40-45, anterolateral/inferolateral HK, mild AI, moderate MR, mild LAE, mild TR, PASP 25 // Echo 2/22: EF 40-45    HLD (hyperlipidemia)    Hyperglycemia A1C 6.0 05/2015.   Hypertension    MI (myocardial infarction) ([place]) x 7   Poor compliance    Seizure disorder ([place])    Seizures ([place])    TOBACCO ABUSE 02/22/2009   Valvular heart disease    a. Echo 05/2015: mild AI, mod MR.    Past Surgical History:  Procedure Laterality Date   CARDIAC CATHETERIZATION [name] 05/29/2015   Procedure: Left Heart Cath and Coronary Angiography;  Surgeon: Belva Crome, MD;  Location: [facility];  Service: Cardiovascular;  Laterality: [name];   COLONOSCOPY  CORONARY ANGIOPLASTY  03/2011   PTCA-prox LCx ISR   CORONARY ANGIOPLASTY WITH STENT PLACEMENT  10/2007   BMS-prox LCx   LEFT HEART CATHETERIZATION WITH CORONARY ANGIOGRAM [name] 10/13/2013   Procedure: LEFT HEART CATHETERIZATION WITH CORONARY ANGIOGRAM;  Surgeon: Blane Ohara, MD;  Location: [facility] C[facility] LAB;  Service: Cardiovascular;  Laterality: [name];   TEE WITHOUT CARDIOVERSION [name] 06/16/2015   Procedure: TRANSESOPHAGEAL ECHOCARDIOGRAM (TEE);  Surgeon: Josue Hector, MD;  Location[facility] ENDOSCOPY;  Service: Cardiovascular;  Laterality: [name];    Current Medications: Current Meds  Medication Sig   carvedilol (COREG) 3.125 MG tablet TAKE 1 TABLET(3.125 MG) BY MOUTH TWICE DAILY WITH A MEAL   Cholecalciferol (VITAMIN D3) 50 MCG (2000 UT) capsule Take 1 capsule (2,000 Units total) by mouth daily.    clopidogrel (PLAVIX) 75 MG tablet TAKE 1 TABLET(75 MG) BY MOUTH DAILY   ezetimibe (ZETIA) 10 MG tablet Take 1 tablet (10 mg total) by mouth daily.   ferrous sulfate 325 (65 FE) MG tablet Take 1 tablet (325 mg total) by mouth daily with breakfast.   furosemide (LASIX) 40 MG tablet TAKE 1 TABLET BY MOUTH EVERY DAY AS NEEDED FOR EDEMA(TAKE FOR WEIGHT GAIN OVER 3 POUNDS IN 1 DAY OR LEG SWELLING)   HYDROcodone-acetaminophen (NORCO/VICODIN) 5-325 MG tablet Take 1 tablet by mouth every 4 (four) hours as needed.   isosorbide mononitrate (IMDUR) 30 MG 24 hr tablet TAKE 1/2 TABLET(15 MG) BY MOUTH DAILY   levETIRAcetam (KEPPRA) 250 MG tablet Take 1 tablet (250 mg total) by mouth 2 (two) times daily.   lisinopril (ZESTRIL) 2.5 MG tablet TAKE 1 TABLET(2.5 MG) BY MOUTH DAILY   Multiple Vitamin (ONE-A-DAY MENS PO) Take 1 tablet by mouth daily.   Niacin (VITAMIN B-3 PO) Take 1 tablet by mouth daily.   nitroGLYCERIN (NITROSTAT) 0.4 MG SL tablet Place 1 tablet (0.4 mg total) under the tongue every 5 (five) minutes as needed for chest pain. place 1 tablet under the tongue if needed every 5 minutes for chest pain for 3 doses Strength: 0.4 mg   PHENobarbital (LUMINAL) 32.4 MG tablet TAKE 1 TABLET(32.4 MG) BY MOUTH FOUR TIMES DAILY   rosuvastatin (CRESTOR) 40 MG tablet TAKE 1 TABLET(40 MG) BY MOUTH DAILY Strength: 40 mg   TURMERIC PO Take 1 capsule by mouth daily.   vitamin B-12 (CYANOCOBALAMIN) 1000 MCG tablet Take 1,000 mcg by mouth daily.   zinc gluconate 50 MG tablet Take 50 mg by mouth daily.     Allergies:   Spironolactone   Social History   Socioeconomic History   Marital status: Single    Spouse name: Not on file   Number of children: 0   Years of education: 12th   Highest education level: Not on file  Occupational History   Occupation: former Dealer  Tobacco Use   Smoking status: Former    Types: Cigars   Smokeless tobacco: Never   Tobacco comments:    smoke cigars 2 per day  Vaping Use    Vaping Use: Never used  Substance and Sexual Activity   Alcohol use: No   Drug use: No   Sexual activity: Not Currently  Other Topics Concern   Not on file  Social History Narrative   Lives alone   Right Handed   Drinks 5-6 cups caffeine   Social Determinants of Health   Financial Resource Strain: Low Risk  (07/09/2018)   Overall Financial Resource Strain (CARDIA)    Difficulty of Paying Living Expenses: Not  hard at all  Food Insecurity: No Food Insecurity (07/09/2018)   Hunger Vital Sign    Worried About Running Out of Food in the Last Year: Never true    Ran Out of Food in the Last Year: Never true  Transportation Needs: No Transportation Needs (07/09/2018)   PRAPARE - Hydrologist (Medical): No    Lack of Transportation (Non-Medical): No  Physical Activity: Inactive (07/09/2018)   Exercise Vital Sign    Days of Exercise per Week: 0 days    Minutes of Exercise per Session: 0 min  Stress: No Stress Concern Present (07/14/2019)   [place]    Feeling of Stress : Not at all  Social Connections: Unknown (07/09/2018)   Social Connection and Isolation Panel [NHANES]    Frequency of Communication with Friends and Family: More than three times a week    Frequency of Social Gatherings with Friends and Family: More than three times a week    Attends Religious Services: More than 4 times per year    Active Member of [employer] or Organizations: Yes    Attends Music therapist: More than 4 times per year    Marital Status: Not on file     Family History: The patient's family history includes Coronary artery disease in his maternal aunt; Heart attack in his maternal uncle; Heart disease in his maternal uncle; Hypertension in an other family member; Stroke in his maternal uncle. There is no history of Colon cancer, Stomach cancer, Rectal cancer, or Pancreatic cancer.  ROS:   Please  see the history of present illness.    All other systems reviewed and are negative.  EKGs/Labs/Other Studies Reviewed:    The following studies were reviewed today: Echo 12/08/2020: 1. Left ventricular ejection fraction, by estimation, is 40 to 45%. The  left ventricle has mildly decreased function. The left ventricle  demonstrates global hypokinesis. The left ventricular internal cavity size  was moderately dilated. There is mild  concentric left ventricular hypertrophy. Left ventricular diastolic  parameters are indeterminate.   2. Right ventricular systolic function is normal. The right ventricular  size is normal.   3. The mitral valve is normal in structure. Mild to moderate mitral valve  regurgitation. No evidence of mitral stenosis.   4. The aortic valve is normal in structure. Aortic valve regurgitation is  mild to moderate. No aortic stenosis is present.   5. Aortic dilatation noted. There is moderate dilatation of the ascending  aorta, measuring 45 mm.   6. The inferior vena cava is normal in size with greater than 50%  respiratory variability, suggesting right atrial pressure of 3 mmHg.   EKG:  EKG is ordered today.  The ekg ordered today demonstrates normal sinus rhythm 81 bpm, left atrial enlargement, nonspecific ST and T wave abnormality.  Recent Labs: 04/22/2022: ALT 29; BUN 20; Creat 1.62; Hemoglobin 11.3; Platelets 363; Potassium 4.3; Sodium 140; TSH 1.31  Recent Lipid Panel    Component Value Date/Time   CHOL 130 04/22/2022 1641   CHOL 135 12/31/2018 1614   TRIG 104 04/22/2022 1641   HDL 37 (L) 04/22/2022 1641   HDL 31 (L) 12/31/2018 1614   CHOLHDL 3.5 04/22/2022 1641   VLDL 10.4 11/22/2021 1630   LDLCALC 74 04/22/2022 1641     Risk Assessment/Calculations:                Physical Exam:  VS:  BP 130/72 (BP Location: Left Arm, Patient Position: Sitting, Cuff Size: Normal)   Pulse 81   Ht '6\' 2"'$  (1.88 m)   Wt 213 lb (96.6 kg)   SpO2 94%   BMI  27.35 kg/m     Wt Readings from Last 3 Encounters:  07/23/22 213 lb (96.6 kg)  04/22/22 199 lb 6 oz (90.4 kg)  02/20/22 202 lb 12.8 oz (92 kg)     GEN:  Well nourished, well developed in no acute distress HEENT: Normal NECK: No JVD; No carotid bruits LYMPHATICS: No lymphadenopathy CARDIAC: RRR, no murmurs, rubs, gallops RESPIRATORY:  Clear to auscultation without rales, wheezing or rhonchi  ABDOMEN: Soft, non-tender, non-distended MUSCULOSKELETAL:  No edema; No deformity  SKIN: Warm and dry NEUROLOGIC:  Alert and oriented x 3 PSYCHIATRIC:  Normal affect   ASSESSMENT:    1. Coronary artery disease involving native coronary artery of native heart without angina pectoris   2. Nonrheumatic mitral valve regurgitation   3. Mixed hyperlipidemia   4. HFmrEF (heart failure with mildly reduced EF)   5. Essential hypertension    PLAN:    In order of problems listed above:  The patient has no anginal symptoms.  He continues on clopidogrel for antiplatelet therapy, antianginal regimen includes isosorbide and carvedilol.  He is treated with high intensity statin drug. Most recent echocardiogram reviewed as outlined above.  He had only mild to moderate mitral regurgitation.  I do not appreciate a murmur on his exam.  He has no functional limitation at present. Treated with a high intensity statin drug with most recent labs showing a cholesterol of 130, HDL 37, LDL 74, triglycerides 104.  ALT was normal at 29.  Lifestyle modification discussed with the patient. Managed with carvedilol, furosemide, and lisinopril.  NYHA functional class I symptoms. Blood pressure well controlled on current medical therapy.  I will plan to see him back in 1 year for follow-up evaluation.  I reviewed his most recent labs with the creatinine of 1.62 and potassium of 4.3.  These findings are consistent with stage IIIa chronic kidney disease.           Medication Adjustments/Labs and Tests Ordered: Current  medicines are reviewed at length with the patient today.  Concerns regarding medicines are outlined above.  Orders Placed This Encounter  Procedures   EKG 12-Lead   No orders of the defined types were placed in this encounter.   Patient Instructions  Medication Instructions:  Your physician recommends that you continue on your current medications as directed. Please refer to the Current Medication list given to you today.  *If you need a refill on your cardiac medications before your next appointment, please call your pharmacy*   Lab Work: NONE If you have labs (blood work) drawn today and your tests are completely normal, you will receive your results only by: [place] (if you have MyChart) OR A paper copy in the mail If you have any lab test that is abnormal or we need to change your treatment, we will call you to review the results.   Testing/Procedures: NONE   Follow-Up: At [facility], you and your health needs are our priority.  As part of our continuing mission to provide you with exceptional heart care, we have created designated Provider Care Teams.  These Care Teams include your primary Cardiologist (physician) and Advanced Practice Providers (APPs -  Physician Assistants and Nurse Practitioners) who all work together to provide you with  the care you need, when you need it.  We recommend signing up for the patient portal called "MyChart".  Sign up information is provided on this After Visit Summary.  MyChart is used to connect with patients for Virtual Visits (Telemedicine).  Patients are able to view lab/test results, encounter notes, upcoming appointments, etc.  Non-urgent messages can be sent to your provider as well.   To learn more about what you can do with MyChart, go to [email or web address].    Your next appointment:   1 year(s)  The format for your next appointment:   In Person  Provider:  Dr. Burt Knack   Other Instructions   Important  Information About Sugar         Signed, Sherren Mocha, MD  07/23/2022 4:11 PM    [place]

## 2022-09-16 DIAGNOSIS — R319 Hematuria, unspecified: Secondary | ICD-10-CM | POA: Diagnosis not present

## 2022-09-16 DIAGNOSIS — Q619 Cystic kidney disease, unspecified: Secondary | ICD-10-CM | POA: Diagnosis not present

## 2022-09-16 DIAGNOSIS — D631 Anemia in chronic kidney disease: Secondary | ICD-10-CM | POA: Diagnosis not present

## 2022-09-16 DIAGNOSIS — N183 Chronic kidney disease, stage 3 unspecified: Secondary | ICD-10-CM | POA: Diagnosis not present

## 2022-09-16 DIAGNOSIS — I129 Hypertensive chronic kidney disease with stage 1 through stage 4 chronic kidney disease, or unspecified chronic kidney disease: Secondary | ICD-10-CM | POA: Diagnosis not present

## 2022-09-16 DIAGNOSIS — N2581 Secondary hyperparathyroidism of renal origin: Secondary | ICD-10-CM | POA: Diagnosis not present

## 2022-10-23 ENCOUNTER — Ambulatory Visit (INDEPENDENT_AMBULATORY_CARE_PROVIDER_SITE_OTHER): Payer: Medicare Other | Admitting: Internal Medicine

## 2022-10-23 ENCOUNTER — Encounter: Payer: Self-pay | Admitting: Internal Medicine

## 2022-10-23 VITALS — BP 128/72 | HR 73 | Temp 98.1°F | Ht 74.0 in | Wt 216.0 lb

## 2022-10-23 DIAGNOSIS — E538 Deficiency of other specified B group vitamins: Secondary | ICD-10-CM

## 2022-10-23 DIAGNOSIS — E1165 Type 2 diabetes mellitus with hyperglycemia: Secondary | ICD-10-CM | POA: Diagnosis not present

## 2022-10-23 DIAGNOSIS — I2510 Atherosclerotic heart disease of native coronary artery without angina pectoris: Secondary | ICD-10-CM

## 2022-10-23 DIAGNOSIS — I10 Essential (primary) hypertension: Secondary | ICD-10-CM

## 2022-10-23 DIAGNOSIS — N1831 Chronic kidney disease, stage 3a: Secondary | ICD-10-CM | POA: Diagnosis not present

## 2022-10-23 DIAGNOSIS — E7800 Pure hypercholesterolemia, unspecified: Secondary | ICD-10-CM

## 2022-10-23 DIAGNOSIS — Z0001 Encounter for general adult medical examination with abnormal findings: Secondary | ICD-10-CM

## 2022-10-23 DIAGNOSIS — E559 Vitamin D deficiency, unspecified: Secondary | ICD-10-CM | POA: Diagnosis not present

## 2022-10-23 DIAGNOSIS — Z125 Encounter for screening for malignant neoplasm of prostate: Secondary | ICD-10-CM

## 2022-10-23 DIAGNOSIS — N183 Chronic kidney disease, stage 3 unspecified: Secondary | ICD-10-CM | POA: Insufficient documentation

## 2022-10-23 NOTE — Assessment & Plan Note (Signed)
BP Readings from Last 3 Encounters:  10/23/22 128/72  07/23/22 130/72  04/22/22 118/60   Stable, pt to continue medical treatment coreg 3.125 mg bid, lisinopril 2.5 mg qd

## 2022-10-23 NOTE — Progress Notes (Deleted)
Complete physical exam  Patient: [name]   DOB: April 06, 1961   62 y.o. Male  MRN: [id number]  Subjective:    Chief Complaint  Patient presents with   17mofollow up    No concerns or questions    [name] a 62y.o. male who presents today for a complete physical exam. He reports consuming a {diet types:17450} diet. {types:19826} He generally feels {DESC; WELL/FAIRLY WELL/POORLY:18703}. He reports sleeping {DESC; WELL/FAIRLY WELL/POORLY:18703}. He {does/does not:200015} have additional problems to discuss today.    Most recent fall risk assessment:    10/23/2022    3:38 PM  FGaylordin the past year? 0  Injury with Fall? 0  Risk for fall due to : No Fall Risks  Follow up Falls evaluation completed     Most recent depression screenings:    10/23/2022    3:38 PM 04/22/2022    4:10 PM  PHQ 2/9 Scores  PHQ - 2 Score 0 0  PHQ- 9 Score 0     {VISON DENTAL STD PSA (Optional):27386}  {History (Optional):23778}  Patient Care Team: JBiagio Borg MD as PCP - General (Internal Medicine) CSherren Mocha MD as PCP - Cardiology (Cardiology) Magrinat, GVirgie Dad MD (Inactive) as Consulting Physician (Oncology) CSherren Mocha MD as Consulting Physician (Cardiology) WSharmon Revereas Physician Assistant (Cardiology) SGarvin Fila MD as Consulting Physician (Neurology)   Outpatient Medications Prior to Visit  Medication Sig   carvedilol (COREG) 3.125 MG tablet TAKE 1 TABLET(3.125 MG) BY MOUTH TWICE DAILY WITH A MEAL   Cholecalciferol (VITAMIN D3) 50 MCG (2000 UT) capsule Take 1 capsule (2,000 Units total) by mouth daily.   clopidogrel (PLAVIX) 75 MG tablet TAKE 1 TABLET(75 MG) BY MOUTH DAILY   ezetimibe (ZETIA) 10 MG tablet Take 1 tablet (10 mg total) by mouth daily.   ferrous sulfate 325 (65 FE) MG tablet Take 1 tablet (325 mg total) by mouth daily with breakfast.   furosemide (LASIX) 40 MG tablet TAKE 1 TABLET BY MOUTH EVERY DAY AS NEEDED FOR  EDEMA(TAKE FOR WEIGHT GAIN OVER 3 POUNDS IN 1 DAY OR LEG SWELLING)   HYDROcodone-acetaminophen (NORCO/VICODIN) 5-325 MG tablet Take 1 tablet by mouth every 4 (four) hours as needed.   isosorbide mononitrate (IMDUR) 30 MG 24 hr tablet TAKE 1/2 TABLET(15 MG) BY MOUTH DAILY   levETIRAcetam (KEPPRA) 250 MG tablet Take 1 tablet (250 mg total) by mouth 2 (two) times daily.   lisinopril (ZESTRIL) 2.5 MG tablet TAKE 1 TABLET(2.5 MG) BY MOUTH DAILY   Multiple Vitamin (ONE-A-DAY MENS PO) Take 1 tablet by mouth daily.   Niacin (VITAMIN B-3 PO) Take 1 tablet by mouth daily.   PHENobarbital (LUMINAL) 32.4 MG tablet TAKE 1 TABLET(32.4 MG) BY MOUTH FOUR TIMES DAILY   rosuvastatin (CRESTOR) 40 MG tablet TAKE 1 TABLET(40 MG) BY MOUTH DAILY Strength: 40 mg   TURMERIC PO Take 1 capsule by mouth daily.   vitamin B-12 (CYANOCOBALAMIN) 1000 MCG tablet Take 1,000 mcg by mouth daily.   zinc gluconate 50 MG tablet Take 50 mg by mouth daily.   nitroGLYCERIN (NITROSTAT) 0.4 MG SL tablet Place 1 tablet (0.4 mg total) under the tongue every 5 (five) minutes as needed for chest pain. place 1 tablet under the tongue if needed every 5 minutes for chest pain for 3 doses Strength: 0.4 mg (Patient not taking: Reported on 10/23/2022)   No facility-administered medications prior to visit.  ROS        Objective:     BP 128/72 (BP Location: Right Arm, Patient Position: Sitting, Cuff Size: Large)   Pulse 73   Temp 98.1 F (36.7 C) (Oral)   Ht _0  (1.88 m)   Wt 216 lb (98 kg)   SpO2 95%   BMI 27.73 kg/m  {Vitals History (Optional):23777}  Physical Exam   No results found for any visits on 10/23/22. {Show previous labs (optional):23779}    Assessment & Plan:    Routine Health Maintenance and Physical Exam  Immunization History  Administered Date(s) Administered   Influenza,inj,Quad PF,6+ Mos 07/16/2019, 10/11/2020   Janssen (J&J) SARS-COV-2 Vaccination 12/28/2019   Tdap 04/12/2019    Health Maintenance   Topic Date Due   Medicare Annual Wellness (AWV)  07/13/2020   HEMOGLOBIN A1C  10/23/2022   OPHTHALMOLOGY EXAM  10/23/2022 (Originally 03/18/1971)   COVID-19 Vaccine (2 - 2023-24 season) 11/08/2022 (Originally 06/21/2022)   INFLUENZA VACCINE  01/19/2023 (Originally 05/21/2022)   Zoster Vaccines- Shingrix (1 of 2) 01/22/2023 (Originally 03/18/2012)   Diabetic kidney evaluation - eGFR measurement  04/23/2023   FOOT EXAM  04/23/2023   Diabetic kidney evaluation - Urine ACR  04/25/2023   COLONOSCOPY (Pts 45-65yr Insurance coverage will need to be confirmed)  09/08/2023   DTaP/Tdap/Td (2 - Td or Tdap) 04/11/2029   Hepatitis C Screening  Completed   HIV Screening  Completed   HPV VACCINES  Aged Out    Discussed health benefits of physical activity, and encouraged him to engage in regular exercise appropriate for his age and condition.  Problem List Items Addressed This Visit       High   Encounter for well adult exam with abnormal findings - Primary     Medium    Diabetes ([place]   No follow-ups on file.     JCathlean Cower MD

## 2022-10-23 NOTE — Assessment & Plan Note (Signed)
Last vitamin D Lab Results  Component Value Date   VD25OH 43 04/22/2022   Stable, cont oral replacement

## 2022-10-23 NOTE — Assessment & Plan Note (Signed)
Age and sex appropriate education and counseling updated with regular exercise and diet Referrals for preventative services - pt to call for eye exam Immunizations addressed - declines covid booster, for shingrix at the pharmacy Smoking counseling  - none needed Evidence for depression or other mood disorder - none significant Most recent labs reviewed. I have personally reviewed and have noted: 1) the patient's medical and social history 2) The patient's current medications and supplements 3) The patient's height, weight, and BMI have been recorded in the chart

## 2022-10-23 NOTE — Progress Notes (Addendum)
Patient ID: MOUSTAPHA TOOKER, male   DOB: 10-07-1961, 62 y.o.   MRN: [id number]         Chief Complaint:: wellness exam and ckd3, DM, htn, hld, low vit d       HPI:  TOBE KERVIN is a 61 y.o. male here for wellness exam; plans to call soon for eye exam, declines covid booster, will consider shingrix at pharmacy, o/w up to date                        Also Pt denies chest pain, increased sob or doe, wheezing, orthopnea, PND, increased LE swelling, palpitations, dizziness or syncope.   Pt denies polydipsia, polyuria, or new focal neuro s/s.    Pt denies fever, wt loss, night sweats, loss of appetite, or other constitutional symptoms    Wt Readings from Last 3 Encounters:  10/23/22 216 lb (98 kg)  07/23/22 213 lb (96.6 kg)  04/22/22 199 lb 6 oz (90.4 kg)   BP Readings from Last 3 Encounters:  10/23/22 128/72  07/23/22 130/72  04/22/22 118/60   Immunization History  Administered Date(s) Administered   Influenza,inj,Quad PF,6+ Mos 07/16/2019, 10/11/2020   Influenza-Unspecified 08/26/2022   Janssen (J&J) SARS-COV-2 Vaccination 12/28/2019   Tdap 04/12/2019   Health Maintenance Due  Topic Date Due   Medicare Annual Wellness (AWV)  07/13/2020   HEMOGLOBIN A1C  10/23/2022      Past Medical History:  Diagnosis Date   Anemia 01/01/2017   Ascending aorta dilation (HCC)    Echocardiogram 2/22: EF 40-45, mild LVH, normal RVSF, mild to mod MR, mild to mod AI, ascending aorta 45 mm.     CAD (coronary artery disease)    a. Overlapping BMS-prox LCx in 2009 b. NSTEMI 2012 due to prox LCx ISR s/p PTCA alone; EF 45% w/ basal inf HK. Had run out of Plavix 1 week prior. c. NSTEMI 05/2015 after running out of Plavix - cath with 3V CAD, Patient left AMA prior to agreeing to CABG. // d. LHC 8/16: LM 60; LAD mid 60, D1 75; RI 50, 80; LCx prox stent 100; RCA prox 100; EF 45-50 >> seen by TCTS - declined CABG >> Med Rx.   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   Hypertension    MI (myocardial infarction) ([place]) x 7   Poor compliance    Seizure disorder ([place])    Seizures ([place])    TOBACCO ABUSE 02/22/2009   Valvular heart disease    a. Echo 05/2015: mild AI, mod MR.   Past Surgical History:  Procedure Laterality Date   CARDIAC CATHETERIZATION [name] 05/29/2015   Procedure: Left Heart Cath and Coronary Angiography;  Surgeon: Belva Crome, MD;  Location: [place] [facility];  Service: Cardiovascular;  Laterality: [name];   COLONOSCOPY  CORONARY ANGIOPLASTY  03/2011   PTCA-prox LCx ISR   CORONARY ANGIOPLASTY WITH STENT PLACEMENT  10/2007   BMS-prox LCx   LEFT HEART CATHETERIZATION WITH CORONARY ANGIOGRAM [name] 10/13/2013   Procedure: LEFT HEART CATHETERIZATION WITH CORONARY ANGIOGRAM;  Surgeon: Blane Ohara, MD;  Location: [facility] CATH LAB;  Service: Cardiovascular;  Laterality: [name];   TEE WITHOUT CARDIOVERSION [name] 06/16/2015   Procedure: TRANSESOPHAGEAL ECHOCARDIOGRAM (TEE);  Surgeon: Josue Hector, MD;  Location: [facility] ENDOSCOPY;  Service: Cardiovascular;  Laterality: [name];    reports that he has quit smoking. His smoking use included cigars. He has never used smokeless tobacco. He reports that he does not drink alcohol and does not use drugs. family history includes Coronary artery disease in his maternal aunt; Heart attack in his maternal uncle; Heart disease in his maternal uncle; Hypertension in an other family member; Stroke in his maternal uncle. Allergies  Allergen  Reactions   Spironolactone Diarrhea   Current Outpatient Medications on File Prior to Visit  Medication Sig Dispense Refill   carvedilol (COREG) 3.125 MG tablet TAKE 1 TABLET(3.125 MG) BY MOUTH TWICE DAILY WITH A MEAL 180 tablet 1   Cholecalciferol (VITAMIN D3) 50 MCG (2000 UT) capsule Take 1 capsule (2,000 Units total) by mouth daily. 90 capsule 99   clopidogrel (PLAVIX) 75 MG tablet TAKE 1 TABLET(75 MG) BY MOUTH DAILY 90 tablet 3   ezetimibe (ZETIA) 10 MG tablet Take 1 tablet (10 mg total) by mouth daily. 90 tablet 3   ferrous sulfate 325 (65 FE) MG tablet Take 1 tablet (325 mg total) by mouth daily with breakfast. 90 tablet 3   furosemide (LASIX) 40 MG tablet TAKE 1 TABLET BY MOUTH EVERY DAY AS NEEDED FOR EDEMA(TAKE FOR WEIGHT GAIN OVER 3 POUNDS IN 1 DAY OR LEG SWELLING) 90 tablet 3   isosorbide mononitrate (IMDUR) 30 MG 24 hr tablet TAKE 1/2 TABLET(15 MG) BY MOUTH DAILY 45 tablet 3   levETIRAcetam (KEPPRA) 250 MG tablet Take 1 tablet (250 mg total) by mouth 2 (two) times daily. 60 tablet 11   lisinopril (ZESTRIL) 2.5 MG tablet TAKE 1 TABLET(2.5 MG) BY MOUTH DAILY 30 tablet 11   Multiple Vitamin (ONE-A-DAY MENS PO) Take 1 tablet by mouth daily.     Niacin (VITAMIN B-3 PO) Take 1 tablet by mouth daily.     PHENobarbital (LUMINAL) 32.4 MG tablet TAKE 1 TABLET(32.4 MG) BY MOUTH FOUR TIMES DAILY 360 tablet 1   rosuvastatin (CRESTOR) 40 MG tablet TAKE 1 TABLET(40 MG) BY MOUTH DAILY Strength: 40 mg 90 tablet 3   TURMERIC PO Take 1 capsule by mouth daily.     vitamin B-12 (CYANOCOBALAMIN) 1000 MCG tablet Take 1,000 mcg by mouth daily.     zinc gluconate 50 MG tablet Take 50 mg by mouth daily.     nitroGLYCERIN (NITROSTAT) 0.4 MG SL tablet Place 1 tablet (0.4 mg total) under the tongue every 5 (five) minutes as needed for chest pain. place 1 tablet under the tongue if needed every 5 minutes for chest pain for 3 doses Strength: 0.4 mg (Patient not taking: Reported on 10/23/2022) 25 tablet 8   No  current facility-administered medications on file prior to visit.        ROS:  All others reviewed and negative.  Objective        PE:  BP 128/72 (BP Location: Right Arm, Patient Position: Sitting, Cuff Size: Large)   Pulse 73   Temp 98.1 F (36.7 C) (Oral)  Ht '6\' 2"'$  (1.88 m)   Wt 216 lb (98 kg)   SpO2 95%   BMI 27.73 kg/m                 Constitutional: Pt appears in NAD               HENT: Head: NCAT.                Right Ear: External ear normal.                 Left Ear: External ear normal.                Eyes: . Pupils are equal, round, and reactive to light. Conjunctivae and EOM are normal               Nose: without d/c or deformity               Neck: Neck supple. Gross normal ROM               Cardiovascular: Normal rate and regular rhythm.                 Pulmonary/Chest: Effort normal and breath sounds without rales or wheezing.                Abd:  Soft, NT, ND, + BS, no organomegaly               Neurological: Pt is alert. At baseline orientation, motor grossly intact               Skin: Skin is warm. No rashes, no other new lesions, LE edema - none               Psychiatric: Pt behavior is normal without agitation   Micro: none  Cardiac tracings I have personally interpreted today:  none  Pertinent Radiological findings (summarize): none   Lab Results  Component Value Date   WBC 7.4 04/22/2022   HGB 11.3 (L) 04/22/2022   HCT 33.5 (L) 04/22/2022   PLT 363 04/22/2022   GLUCOSE 91 04/22/2022   CHOL 130 04/22/2022   TRIG 104 04/22/2022   HDL 37 (L) 04/22/2022   LDLCALC 74 04/22/2022   ALT 29 04/22/2022   AST 23 04/22/2022   NA 140 04/22/2022   K 4.3 04/22/2022   CL 110 04/22/2022   CREATININE 1.62 (H) 04/22/2022   BUN 20 04/22/2022   CO2 26 04/22/2022   TSH 1.31 04/22/2022   PSA 1.69 04/22/2022   INR 1.01 05/27/2015   HGBA1C 5.6 04/22/2022   MICROALBUR 0.7 04/24/2022   Assessment/Plan:  FAOLAN SPRINGFIELD is a 62 y.o. Black or African American [2]  male with  has a past medical history of Anemia (01/01/2017), Ascending aorta dilation ([place]), CAD (coronary artery disease), Clotting disorder ([place]), Diabetes mellitus without complication ([place]), HFmrEF (heart failure with mildy reduced EF) (06/06/2015), HLD (hyperlipidemia), Hyperglycemia (A1C 6.0 05/2015.), Hypertension, MI (myocardial infarction) ([place]) (x 7), Poor compliance, Seizure disorder ([place]), Seizures ([place]), TOBACCO ABUSE (02/22/2009), and Valvular heart disease.  Encounter for well adult exam with abnormal findings Age and sex appropriate education and counseling updated with regular exercise and diet Referrals for preventative services - pt to call for eye exam Immunizations addressed - declines covid booster, for shingrix at the pharmacy Smoking counseling  - none needed Evidence for depression or other mood disorder - none significant Most recent labs reviewed. I have personally  reviewed and have noted: 1) the patient's medical and social history 2) The patient's current medications and supplements 3) The patient's height, weight, and BMI have been recorded in the chart   Diabetes [facility]) Lab Results  Component Value Date   HGBA1C 5.6 04/22/2022   Stable, pt to continue current medical treatment  - diet, wt control   Essential hypertension BP Readings from Last 3 Encounters:  10/23/22 128/72  07/23/22 130/72  04/22/22 118/60   Stable, pt to continue medical treatment coreg 3.125 mg bid, lisinopril 2.5 mg qd   Pure hypercholesterolemia Lab Results  Component Value Date   [place] 74 04/22/2022   Uncontrolled, goal LDL < 70,, pt to continue current crestor 40 mg qd, zetia 10 mg qd, for f/u lab today, lower chol diet as declines other change   Vitamin D deficiency Last vitamin D Lab Results  Component Value Date   VD25OH 43 04/22/2022   Stable, cont oral replacement   CKD (chronic kidney disease) stage 3, GFR 30-59 ml/min (HCC) Lab Results  Component Value Date    CREATININE 1.62 (H) 04/22/2022   Stable overall, cont to avoid nephrotoxins, f/u renal as planned Followup: Return in about 6 months (around 04/23/2023).  Cathlean Cower, MD 10/23/2022 7:59 PM [facility]

## 2022-10-23 NOTE — Progress Notes (Signed)
Patient ID: [name], male   DOB: [birth year], 62 y.o.   MRN: [id number]

## 2022-10-23 NOTE — Assessment & Plan Note (Signed)
Lab Results  Component Value Date   HGBA1C 5.6 04/22/2022   Stable, pt to continue current medical treatment  - diet, wt control

## 2022-10-23 NOTE — Patient Instructions (Addendum)
Please have your Shingles shot at the pharmacy.    Please continue all your other medications.  Please go to the LAB for further testing  Please return in 6 months

## 2022-10-23 NOTE — Assessment & Plan Note (Signed)
Lab Results  Component Value Date   LDLCALC 74 04/22/2022   Uncontrolled, goal LDL < 70,, pt to continue current crestor 40 mg qd, zetia 10 mg qd, for f/u lab today, lower chol diet as declines other change

## 2022-10-23 NOTE — Addendum Note (Signed)
Addended by: Biagio Borg on: 10/23/2022 08:00 PM   Modules accepted: Level of Service

## 2022-10-23 NOTE — Assessment & Plan Note (Signed)
Lab Results  Component Value Date   CREATININE 1.62 (H) 04/22/2022   Stable overall, cont to avoid nephrotoxins, f/u renal as planned

## 2022-10-24 LAB — LIPID PANEL
Cholesterol: 125 mg/dL (ref 0–200)
HDL: 35.1 mg/dL — ABNORMAL LOW (ref >39.00)
LDL Cholesterol: 75 mg/dL (ref 0–99)
NonHDL: 89.9
Total CHOL/HDL Ratio: 4
Triglycerides: 74 mg/dL (ref 0.0–149.0)
VLDL: 14.8 mg/dL (ref 0.0–40.0)

## 2022-10-24 LAB — CBC WITH DIFFERENTIAL/PLATELET
Basophils Absolute: 0.1 10*3/uL (ref 0.0–0.1)
Basophils Relative: 1.2 % (ref 0.0–3.0)
Eosinophils Absolute: 0.3 10*3/uL (ref 0.0–0.7)
Eosinophils Relative: 4.4 % (ref 0.0–5.0)
HCT: 30.8 % — ABNORMAL LOW (ref 39.0–52.0)
Hemoglobin: 10.4 g/dL — ABNORMAL LOW (ref 13.0–17.0)
Lymphocytes Relative: 24.8 % (ref 12.0–46.0)
Lymphs Abs: 1.9 10*3/uL (ref 0.7–4.0)
MCHC: 33.6 g/dL (ref 30.0–36.0)
MCV: 86.2 fl (ref 78.0–100.0)
Monocytes Absolute: 0.8 10*3/uL (ref 0.1–1.0)
Monocytes Relative: 10.1 % (ref 3.0–12.0)
Neutro Abs: 4.5 10*3/uL (ref 1.4–7.7)
Neutrophils Relative %: 59.5 % (ref 43.0–77.0)
Platelets: 405 10*3/uL — ABNORMAL HIGH (ref 150.0–400.0)
RBC: 3.57 Mil/uL — ABNORMAL LOW (ref 4.22–5.81)
RDW: 17.6 % — ABNORMAL HIGH (ref 11.5–15.5)
WBC: 7.5 10*3/uL (ref 4.0–10.5)

## 2022-10-24 LAB — HEMOGLOBIN A1C: Hgb A1c MFr Bld: 6.3 % (ref 4.6–6.5)

## 2022-10-24 LAB — URINALYSIS, ROUTINE W REFLEX MICROSCOPIC
Bilirubin Urine: NEGATIVE
Ketones, ur: NEGATIVE
Leukocytes,Ua: NEGATIVE
Nitrite: NEGATIVE
RBC / HPF: NONE SEEN (ref 0–?)
Specific Gravity, Urine: 1.02 (ref 1.000–1.030)
Total Protein, Urine: NEGATIVE
Urine Glucose: NEGATIVE
Urobilinogen, UA: 0.2 (ref 0.0–1.0)
WBC, UA: NONE SEEN (ref 0–?)
pH: 6 (ref 5.0–8.0)

## 2022-10-24 LAB — BASIC METABOLIC PANEL
BUN: 18 mg/dL (ref 6–23)
CO2: 26 mEq/L (ref 19–32)
Calcium: 9.2 mg/dL (ref 8.4–10.5)
Chloride: 104 mEq/L (ref 96–112)
Creatinine, Ser: 1.43 mg/dL (ref 0.40–1.50)
GFR: 53.22 mL/min — ABNORMAL LOW (ref >60.00)
Glucose, Bld: 93 mg/dL (ref 70–99)
Potassium: 4.7 mEq/L (ref 3.5–5.1)
Sodium: 137 mEq/L (ref 135–145)

## 2022-10-24 LAB — MICROALBUMIN / CREATININE URINE RATIO
Creatinine,U: 121.7 mg/dL
Microalb Creat Ratio: 0.6 mg/g (ref 0.0–30.0)
Microalb, Ur: <0.7 mg/dL (ref 0.0–1.9)

## 2022-10-24 LAB — VITAMIN D 25 HYDROXY (VIT D DEFICIENCY, FRACTURES): VITD: 67.22 ng/mL (ref 30.00–100.00)

## 2022-10-24 LAB — HEPATIC FUNCTION PANEL
ALT: 21 U/L (ref 0–53)
AST: 19 U/L (ref 0–37)
Albumin: 4.3 g/dL (ref 3.5–5.2)
Alkaline Phosphatase: 70 U/L (ref 39–117)
Bilirubin, Direct: 0.1 mg/dL (ref 0.0–0.3)
Total Bilirubin: 0.3 mg/dL (ref 0.2–1.2)
Total Protein: 7.6 g/dL (ref 6.0–8.3)

## 2022-10-24 LAB — VITAMIN B12: Vitamin B-12: 403 pg/mL (ref 211–911)

## 2022-10-24 LAB — TSH: TSH: 2.12 u[IU]/mL (ref 0.35–5.50)

## 2022-10-24 LAB — PSA: PSA: 2.52 ng/mL (ref 0.10–4.00)

## 2022-11-03 ENCOUNTER — Other Ambulatory Visit: Payer: Self-pay | Admitting: Cardiovascular Disease

## 2022-11-03 DIAGNOSIS — I25118 Atherosclerotic heart disease of native coronary artery with other forms of angina pectoris: Secondary | ICD-10-CM

## 2022-11-03 DIAGNOSIS — Z91199 Patient's noncompliance with other medical treatment and regimen due to unspecified reason: Secondary | ICD-10-CM

## 2022-11-03 DIAGNOSIS — I214 Non-ST elevation (NSTEMI) myocardial infarction: Secondary | ICD-10-CM

## 2022-11-03 DIAGNOSIS — I340 Nonrheumatic mitral (valve) insufficiency: Secondary | ICD-10-CM

## 2022-11-03 DIAGNOSIS — I10 Essential (primary) hypertension: Secondary | ICD-10-CM

## 2022-11-12 ENCOUNTER — Ambulatory Visit: Payer: Medicare Other | Admitting: Cardiovascular Disease

## 2022-12-16 ENCOUNTER — Ambulatory Visit (INDEPENDENT_AMBULATORY_CARE_PROVIDER_SITE_OTHER): Payer: 59

## 2022-12-16 ENCOUNTER — Ambulatory Visit
Admission: EM | Admit: 2022-12-16 | Discharge: 2022-12-16 | Disposition: A | Discharge status: Home / Self Care | Payer: 59 | Attending: Physician Assistant | Admitting: Physician Assistant

## 2022-12-16 DIAGNOSIS — J439 Emphysema, unspecified: Secondary | ICD-10-CM | POA: Diagnosis not present

## 2022-12-16 DIAGNOSIS — R059 Cough, unspecified: Secondary | ICD-10-CM

## 2022-12-16 DIAGNOSIS — J209 Acute bronchitis, unspecified: Secondary | ICD-10-CM | POA: Diagnosis not present

## 2022-12-16 MED ORDER — ALBUTEROL SULFATE HFA 108 (90 BASE) MCG/ACT IN AERS: 2.0000 | INHALATION_SPRAY | Freq: Four times a day (QID) | RESPIRATORY_TRACT | 2 refills | Status: DC | PRN

## 2022-12-16 MED ORDER — DOXYCYCLINE HYCLATE 100 MG PO CAPS: 100.0000 mg | ORAL_CAPSULE | Freq: Two times a day (BID) | ORAL | 0 refills | Status: DC
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
## 2022-12-16 NOTE — ED Triage Notes (Signed)
Pt c/o cough, nasal congestion with drainage, sneezing inset ~ 2-3 weeks ago not relieved with otc remedies.

## 2022-12-16 NOTE — Discharge Instructions (Addendum)
Return if any problems.

## 2022-12-29 ENCOUNTER — Other Ambulatory Visit: Payer: Self-pay | Admitting: Neurology

## 2023-01-04 ENCOUNTER — Other Ambulatory Visit: Payer: Self-pay | Admitting: Cardiovascular Disease

## 2023-01-04 ENCOUNTER — Other Ambulatory Visit: Payer: Self-pay | Admitting: Nurse Practitioner

## 2023-01-04 DIAGNOSIS — Z91199 Patient's noncompliance with other medical treatment and regimen due to unspecified reason: Secondary | ICD-10-CM

## 2023-01-04 DIAGNOSIS — I5020 Unspecified systolic (congestive) heart failure: Secondary | ICD-10-CM

## 2023-01-04 DIAGNOSIS — I2510 Atherosclerotic heart disease of native coronary artery without angina pectoris: Secondary | ICD-10-CM

## 2023-01-04 DIAGNOSIS — I340 Nonrheumatic mitral (valve) insufficiency: Secondary | ICD-10-CM

## 2023-01-04 DIAGNOSIS — I214 Non-ST elevation (NSTEMI) myocardial infarction: Secondary | ICD-10-CM

## 2023-01-04 DIAGNOSIS — I10 Essential (primary) hypertension: Secondary | ICD-10-CM

## 2023-01-04 DIAGNOSIS — I25118 Atherosclerotic heart disease of native coronary artery with other forms of angina pectoris: Secondary | ICD-10-CM

## 2023-01-12 ENCOUNTER — Other Ambulatory Visit: Payer: Self-pay | Admitting: Neurology

## 2023-02-16 ENCOUNTER — Emergency Department (HOSPITAL_COMMUNITY): Payer: 59

## 2023-02-16 ENCOUNTER — Encounter (HOSPITAL_COMMUNITY): Payer: Self-pay

## 2023-02-16 ENCOUNTER — Emergency Department (HOSPITAL_COMMUNITY)
Admission: EM | Admit: 2023-02-16 | Discharge: 2023-02-16 | Disposition: A | Discharge status: Home / Self Care | Payer: 59 | Attending: Emergency Medicine | Admitting: Emergency Medicine

## 2023-02-16 DIAGNOSIS — R079 Chest pain, unspecified: Secondary | ICD-10-CM | POA: Diagnosis not present

## 2023-02-16 DIAGNOSIS — R0602 Shortness of breath: Secondary | ICD-10-CM | POA: Insufficient documentation

## 2023-02-16 DIAGNOSIS — R0789 Other chest pain: Secondary | ICD-10-CM | POA: Diagnosis not present

## 2023-02-16 LAB — CBC
HCT: 28.8 % — ABNORMAL LOW (ref 39.0–52.0)
Hemoglobin: 9.1 g/dL — ABNORMAL LOW (ref 13.0–17.0)
MCH: 28.3 pg (ref 26.0–34.0)
MCHC: 31.6 g/dL (ref 30.0–36.0)
MCV: 89.7 fL (ref 80.0–100.0)
Platelets: 368 10*3/uL (ref 150–400)
RBC: 3.21 MIL/uL — ABNORMAL LOW (ref 4.22–5.81)
RDW: 17.1 % — ABNORMAL HIGH (ref 11.5–15.5)
WBC: 7 10*3/uL (ref 4.0–10.5)
nRBC: 0 % (ref 0.0–0.2)

## 2023-02-16 LAB — BASIC METABOLIC PANEL
Anion gap: 13 (ref 5–15)
BUN: 19 mg/dL (ref 6–20)
CO2: 22 mmol/L (ref 22–32)
Calcium: 9.2 mg/dL (ref 8.9–10.3)
Chloride: 103 mmol/L (ref 98–111)
Creatinine, Ser: 1.7 mg/dL — ABNORMAL HIGH (ref 0.61–1.24)
GFR, Estimated: 46 mL/min — ABNORMAL LOW (ref >60)
Glucose, Bld: 92 mg/dL (ref 70–99)
Potassium: 4.4 mmol/L (ref 3.5–5.1)
Sodium: 138 mmol/L (ref 135–145)

## 2023-02-16 LAB — TROPONIN I (HIGH SENSITIVITY): Troponin I (High Sensitivity): 10 ng/L (ref <18)

## 2023-02-16 LAB — D-DIMER, QUANTITATIVE: D-Dimer, Quant: 1.32 ug/mL-FEU — ABNORMAL HIGH (ref 0.00–0.50)

## 2023-02-16 MED ORDER — SODIUM CHLORIDE 0.9 % IV BOLUS
500.0000 mL | Freq: Once | INTRAVENOUS | Status: AC
  Administered 2023-02-16: 500 mL via INTRAVENOUS

## 2023-02-16 MED ORDER — ACETAMINOPHEN 325 MG PO TABS: 650.0000 mg | ORAL_TABLET | Freq: Once | ORAL | Status: DC

## 2023-02-16 MED ORDER — IOHEXOL 350 MG/ML SOLN: 75.0000 mL | Freq: Once | INTRAVENOUS | Status: DC | PRN

## 2023-02-16 MED ORDER — SODIUM CHLORIDE (PF) 0.9 % IJ SOLN
INTRAMUSCULAR | Status: AC
  Filled 2023-02-16: qty 100

## 2023-02-16 NOTE — ED Provider Notes (Signed)
[place] [facility]ERGENCY DEPARTMENT AT [facility] Provider Note   CSN: [id number] Arrival date & time: 02/16/23  1841     History  Chief Complaint  Patient presents with   Chest Pain   Shortness of Breath    [name] is a 62 y.o. male.  HPI 62 year old male presents with 3 days of right-sided chest pain.  A little shortness of breath but he took his inhaler and it made it better.  He had some slight cough but he thinks this is from allergies and its only been a couple times.  No fevers, leg swelling, abdominal pain, back pain.  Pain gets better when he takes some Tylenol or the albuterol.  He denies any productive cough and no hemoptysis.  Worse when he coughs but also worse when he takes a deep breath. Pain is mild currently, declines pain meds.   Home Medications Prior to Admission medications   Medication Sig Start Date End Date Taking? Authorizing Provider  albuterol (VENTOLIN HFA) 108 (90 Base) MCG/ACT inhaler Inhale 2 puffs into the lungs every 6 (six) hours as needed for wheezing or shortness of breath. 12/16/22   Elson Areas, PA-C  carvedilol (COREG) 3.125 MG tablet TAKE 1 TABLET(3.125 MG) BY MOUTH TWICE DAILY WITH A MEAL 01/06/23   Tonny Bollman, MD  Cholecalciferol (VITAMIN D3) 50 MCG (2000 UT) capsule Take 1 capsule (2,000 Units total) by mouth daily. 04/11/21   Corwin Levins, MD  clopidogrel (PLAVIX) 75 MG tablet TAKE 1 TABLET(75 MG) BY MOUTH DAILY 01/06/23   Swinyer, Zachary George, NP  doxycycline (VIBRAMYCIN) 100 MG capsule Take 1 capsule (100 mg total) by mouth 2 (two) times daily. 12/16/22   Elson Areas, PA-C  ezetimibe (ZETIA) 10 MG tablet TAKE 1 TABLET(10 MG) BY MOUTH DAILY 01/06/23   Swinyer, Zachary George, NP  ferrous sulfate 325 (65 FE) MG tablet Take 1 tablet (325 mg total) by mouth daily with breakfast. 01/16/17   Tereso Newcomer T, PA-C  furosemide (LASIX) 40 MG tablet TAKE 1 TABLET BY MOUTH EVERY DAY AS NEEDED FOR EDEMA(TAKE FOR WEIGHT GAIN OVER 3  POUNDS IN 1 DAY OR LEG SWELLING) 04/11/21   Corwin Levins, MD  isosorbide mononitrate (IMDUR) 30 MG 24 hr tablet TAKE 1/2 TABLET(15 MG) BY MOUTH DAILY 11/04/22   Tonny Bollman, MD  levETIRAcetam (KEPPRA) 250 MG tablet TAKE 1 TABLET(250 MG) BY MOUTH TWICE DAILY 01/15/23   Micki Riley, MD  lisinopril (ZESTRIL) 2.5 MG tablet TAKE 1 TABLET(2.5 MG) BY MOUTH DAILY 01/06/23   Tonny Bollman, MD  Multiple Vitamin (ONE-A-DAY MENS PO) Take 1 tablet by mouth daily.    [provider]  Niacin (VITAMIN B-3 PO) Take 1 tablet by mouth daily.    [provider]  nitroGLYCERIN (NITROSTAT) 0.4 MG SL tablet Place 1 tablet (0.4 mg total) under the tongue every 5 (five) minutes as needed for chest pain. place 1 tablet under the tongue if needed every 5 minutes for chest pain for 3 doses Strength: 0.4 mg Patient not taking: Reported on 10/23/2022 04/29/22   Swinyer, Zachary George, NP  PHENobarbital (LUMINAL) 32.4 MG tablet TAKE 1 TABLET(32.4 MG) BY MOUTH FOUR TIMES DAILY 01/02/23   Dohmeier, Porfirio Mylar, MD  rosuvastatin (CRESTOR) 40 MG tablet TAKE 1 TABLET BY MOUTH EVERY DAY 01/06/23   Swinyer, Zachary George, NP  TURMERIC PO Take 1 capsule by mouth daily.    [provider]  vitamin B-12 (CYANOCOBALAMIN) 1000 MCG tablet Take  1,000 mcg by mouth daily.    [provider]  zinc gluconate 50 MG tablet Take 50 mg by mouth daily.    [provider]      Allergies    Spironolactone    Review of Systems   Review of Systems  Constitutional:  Negative for fever.  Respiratory:  Positive for cough (minimal) and shortness of breath.   Cardiovascular:  Positive for chest pain. Negative for leg swelling.  Gastrointestinal:  Negative for abdominal pain.  Musculoskeletal:  Negative for back pain.    Physical Exam Updated Vital Signs BP 126/81   Pulse 87   Temp 99.1 F (37.3 C) (Oral)   Resp 20   SpO2 97%  Physical Exam Vitals and nursing note reviewed.  Constitutional:       Appearance: He is well-developed.  HENT:     Head: Normocephalic and atraumatic.  Cardiovascular:     Rate and Rhythm: Normal rate and regular rhythm.     Heart sounds: Normal heart sounds.  Pulmonary:     Effort: Pulmonary effort is normal.     Breath sounds: Normal breath sounds.  Chest:     Chest wall: Tenderness present.    Abdominal:     Palpations: Abdomen is soft.     Tenderness: There is no abdominal tenderness.  Musculoskeletal:     Right lower leg: No edema.     Left lower leg: No edema.  Skin:    General: Skin is warm and dry.  Neurological:     Mental Status: He is alert.     ED Results / Procedures / Treatments   Labs (all labs ordered are listed, but only abnormal results are displayed) Labs Reviewed  BASIC METABOLIC PANEL - Abnormal; Notable for the following components:      Result Value   Creatinine, Ser 1.70 (*)    GFR, Estimated 46 (*)    All other components within normal limits  CBC - Abnormal; Notable for the following components:   RBC 3.21 (*)    Hemoglobin 9.1 (*)    HCT 28.8 (*)    RDW 17.1 (*)    All other components within normal limits  D-DIMER, QUANTITATIVE - Abnormal; Notable for the following components:   D-Dimer, Quant 1.32 (*)    All other components within normal limits  TROPONIN I (HIGH SENSITIVITY)  TROPONIN I (HIGH SENSITIVITY)    EKG EKG Interpretation  Date/Time:  Sunday February 16 2023 18:52:51 EDT Ventricular Rate:  88 PR Interval:  154 QRS Duration: 114 QT Interval:  339 QTC Calculation: 411 R Axis:   -2 Text Interpretation: Sinus rhythm Left atrial enlargement Incomplete right bundle branch block Nonspecific repol abnormality, lateral leads  ST changes similar to Feb 2023 Confirmed by Pricilla Loveless [phone]) on 02/16/2023 7:35:33 PM  Radiology DG Chest 2 View  Result Date: 02/16/2023 CLINICAL DATA:  Chest pain EXAM: CHEST - 2 VIEW COMPARISON:  12/16/2022 FINDINGS: Unchanged cardiomegaly. No focal pulmonary  opacity. No pleural effusion or pneumothorax. No acute osseous abnormality. IMPRESSION: Unchanged cardiomegaly. No acute cardiopulmonary process. Electronically Signed   By: Wiliam Ke M.D.   On: 02/16/2023 19:35    Procedures Procedures    Medications Ordered in ED Medications  acetaminophen (TYLENOL) tablet 650 mg (has no administration in time range)  iohexol (OMNIPAQUE) 350 MG/ML injection 75 mL (has no administration in time range)  sodium chloride 0.9 % bolus 500 mL (0 mLs Intravenous Stopped 02/16/23 2120)  ED Course/ Medical Decision Making/ A&P                             Medical Decision Making Amount and/or Complexity of Data Reviewed Labs: ordered.    Details: Elevated D-dimer 1.3.  Creatinine mildly increased from baseline at 1.7.  Hemoglobin 9.1, slightly worse than baseline.  Troponin normal. Radiology: ordered and independent interpretation performed.    Details: No pneumonia ECG/medicine tests: ordered and independent interpretation performed.    Details: No acute ischemia.  Risk OTC drugs. Prescription drug management.   This could all be chest wall pain but the patient's is having pleuritic pain and is 61 so he cannot be PERC ruled out for PE.  D-dimer obtained and is elevated.  I recommended a CTA which she originally agreed to.  However about 2 hours and 45 minutes and he states he needs to go.  He thinks he has been here too long.  Had a discussion with him at the bedside the nurse had recommended he stay for CT as I cannot fully rule out PE and this could be a potentially life-threatening diagnosis/illness.  We discussed this multiple times, he still wants to leave.  I think ACS is a lot less likely.  There is no obvious pneumonia.  Clear lungs.  Could be chest wall, but again he needs a CTA.  He was encouraged he can return at any time, especially if he is getting worse.  Follow-up with PCP.  Will give return precautions.  He will leave  AMA.        Final Clinical Impression(s) / ED Diagnoses Final diagnoses:  Right-sided chest pain    Rx / DC Orders ED Discharge Orders     None         Pricilla Loveless, MD 02/16/23 2155

## 2023-02-16 NOTE — Discharge Instructions (Addendum)
It was recommended that you get a CT of your chest to help rule out a blood clot.  This is a potentially life-threatening condition.  You have chosen not to get the CT scan.  While we respect your choice, I still think that you needed the test.    If you feel worse in any way, develop dizziness or lightheadedness, shortness of breath, chest pain, fever, or any other new/concerning symptoms, please return to the ER or call 911.

## 2023-02-16 NOTE — ED Triage Notes (Addendum)
Pt arrives today c/o right sided chest pain, along with shortness of breath x3 days. Pt states pain is worse when he coughs.   Pt states he was dx with PNA 1 month ago

## 2023-02-16 NOTE — ED Notes (Signed)
Pt refused CT.  EDP notified and he came to speak with pt in room.

## 2023-03-05 ENCOUNTER — Encounter: Payer: Self-pay | Admitting: Neurology

## 2023-03-05 ENCOUNTER — Ambulatory Visit (INDEPENDENT_AMBULATORY_CARE_PROVIDER_SITE_OTHER): Payer: 59 | Admitting: Neurology

## 2023-03-05 VITALS — BP 115/74 | HR 76 | Ht 74.0 in | Wt 227.0 lb

## 2023-03-05 DIAGNOSIS — Z8669 Personal history of other diseases of the nervous system and sense organs: Secondary | ICD-10-CM

## 2023-03-05 MED ORDER — LEVETIRACETAM 250 MG PO TABS: 250.0000 mg | ORAL_TABLET | Freq: Every day | ORAL | 11 refills | Status: DC

## 2023-03-05 NOTE — Patient Instructions (Signed)
I had a long discussion with the patient regarding his remote seizures and has done extremely well and has been seizure-free since October 2015.  I recommend we taper Keppra to 250 mg once  daily x 1 month and stop and continue phenobarbital and the current dosage of 32.4 mg 4 times a day.  He was advised to avoid seizure provoking stimuli like abrupt medication discontinuation, skin deprivation, irregular eating and sleeping habits and extremes of exertion and alcohol and stimulants.  He will return for follow-up in the future in a year or call earlier if necessary.
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
## 2023-04-04 ENCOUNTER — Other Ambulatory Visit: Payer: Self-pay | Admitting: Cardiovascular Disease

## 2023-04-04 DIAGNOSIS — I340 Nonrheumatic mitral (valve) insufficiency: Secondary | ICD-10-CM

## 2023-04-04 DIAGNOSIS — Z91199 Patient's noncompliance with other medical treatment and regimen due to unspecified reason: Secondary | ICD-10-CM

## 2023-04-04 DIAGNOSIS — I214 Non-ST elevation (NSTEMI) myocardial infarction: Secondary | ICD-10-CM

## 2023-04-04 DIAGNOSIS — I25118 Atherosclerotic heart disease of native coronary artery with other forms of angina pectoris: Secondary | ICD-10-CM

## 2023-04-04 DIAGNOSIS — I10 Essential (primary) hypertension: Secondary | ICD-10-CM

## 2023-04-30 ENCOUNTER — Ambulatory Visit (INDEPENDENT_AMBULATORY_CARE_PROVIDER_SITE_OTHER): Payer: 59 | Admitting: Internal Medicine

## 2023-04-30 ENCOUNTER — Encounter: Payer: Self-pay | Admitting: Internal Medicine

## 2023-04-30 ENCOUNTER — Other Ambulatory Visit: Payer: Self-pay | Admitting: Internal Medicine

## 2023-04-30 VITALS — BP 118/68 | HR 70 | Temp 98.9°F | Ht 74.0 in | Wt 232.0 lb

## 2023-04-30 DIAGNOSIS — I10 Essential (primary) hypertension: Secondary | ICD-10-CM | POA: Diagnosis not present

## 2023-04-30 DIAGNOSIS — E559 Vitamin D deficiency, unspecified: Secondary | ICD-10-CM

## 2023-04-30 DIAGNOSIS — E1165 Type 2 diabetes mellitus with hyperglycemia: Secondary | ICD-10-CM | POA: Diagnosis not present

## 2023-04-30 DIAGNOSIS — D509 Iron deficiency anemia, unspecified: Secondary | ICD-10-CM

## 2023-04-30 DIAGNOSIS — D638 Anemia in other chronic diseases classified elsewhere: Secondary | ICD-10-CM

## 2023-04-30 DIAGNOSIS — E7800 Pure hypercholesterolemia, unspecified: Secondary | ICD-10-CM

## 2023-04-30 DIAGNOSIS — N1831 Chronic kidney disease, stage 3a: Secondary | ICD-10-CM | POA: Diagnosis not present

## 2023-04-30 LAB — BASIC METABOLIC PANEL
BUN: 12 mg/dL (ref 6–23)
CO2: 25 mEq/L (ref 19–32)
Calcium: 9.3 mg/dL (ref 8.4–10.5)
Chloride: 107 mEq/L (ref 96–112)
Creatinine, Ser: 1.33 mg/dL (ref 0.40–1.50)
GFR: 57.85 mL/min — ABNORMAL LOW (ref >60.00)
Glucose, Bld: 97 mg/dL (ref 70–99)
Potassium: 4.5 mEq/L (ref 3.5–5.1)
Sodium: 138 mEq/L (ref 135–145)

## 2023-04-30 LAB — HEPATIC FUNCTION PANEL
ALT: 17 U/L (ref 0–53)
AST: 19 U/L (ref 0–37)
Albumin: 4.2 g/dL (ref 3.5–5.2)
Alkaline Phosphatase: 82 U/L (ref 39–117)
Bilirubin, Direct: 0 mg/dL (ref 0.0–0.3)
Total Bilirubin: 0.2 mg/dL (ref 0.2–1.2)
Total Protein: 8.1 g/dL (ref 6.0–8.3)

## 2023-04-30 LAB — LIPID PANEL
Cholesterol: 126 mg/dL (ref 0–200)
HDL: 36.8 mg/dL — ABNORMAL LOW (ref >39.00)
LDL Cholesterol: 70 mg/dL (ref 0–99)
NonHDL: 89.57
Total CHOL/HDL Ratio: 3
Triglycerides: 98 mg/dL (ref 0.0–149.0)
VLDL: 19.6 mg/dL (ref 0.0–40.0)

## 2023-04-30 LAB — CBC WITH DIFFERENTIAL/PLATELET
Basophils Absolute: 0.1 10*3/uL (ref 0.0–0.1)
Basophils Relative: 1 % (ref 0.0–3.0)
Eosinophils Absolute: 0.3 10*3/uL (ref 0.0–0.7)
Eosinophils Relative: 4.1 % (ref 0.0–5.0)
HCT: 29.8 % — ABNORMAL LOW (ref 39.0–52.0)
Hemoglobin: 9.9 g/dL — ABNORMAL LOW (ref 13.0–17.0)
Lymphocytes Relative: 32.6 % (ref 12.0–46.0)
Lymphs Abs: 2.2 10*3/uL (ref 0.7–4.0)
MCHC: 33.2 g/dL (ref 30.0–36.0)
MCV: 86.5 fl (ref 78.0–100.0)
Monocytes Absolute: 0.8 10*3/uL (ref 0.1–1.0)
Monocytes Relative: 12.7 % — ABNORMAL HIGH (ref 3.0–12.0)
Neutro Abs: 3.3 10*3/uL (ref 1.4–7.7)
Neutrophils Relative %: 49.6 % (ref 43.0–77.0)
Platelets: 412 10*3/uL — ABNORMAL HIGH (ref 150.0–400.0)
RBC: 3.45 Mil/uL — ABNORMAL LOW (ref 4.22–5.81)
RDW: 18.4 % — ABNORMAL HIGH (ref 11.5–15.5)
WBC: 6.7 10*3/uL (ref 4.0–10.5)

## 2023-04-30 LAB — FERRITIN: Ferritin: 14.3 ng/mL — ABNORMAL LOW (ref 22.0–322.0)

## 2023-04-30 LAB — IBC PANEL
Iron: 50 ug/dL (ref 42–165)
Saturation Ratios: 13 % — ABNORMAL LOW (ref 20.0–50.0)
TIBC: 385 ug/dL (ref 250.0–450.0)
Transferrin: 275 mg/dL (ref 212.0–360.0)

## 2023-04-30 LAB — HEMOGLOBIN A1C: Hgb A1c MFr Bld: 6.9 % — ABNORMAL HIGH (ref 4.6–6.5)

## 2023-04-30 MED ORDER — POLYSACCHARIDE IRON COMPLEX 150 MG PO CAPS
150.0000 mg | ORAL_CAPSULE | Freq: Every day | ORAL | 1 refills | Status: DC
Start: 2023-04-30 — End: 2023-11-03

## 2023-04-30 NOTE — Progress Notes (Signed)
Patient ID: [name], male   DOB: 06-20-1961, 62 y.o.   MRN: [id number]        Chief Complaint: follow up anemia, dm, ckd, htn, low vit d       HPI:  [name] is a 62 y.o. male here overall doing ok, Pt denies chest pain, increased sob or doe, wheezing, orthopnea, PND, increased LE swelling, palpitations, dizziness or syncope.   Pt denies polydipsia, polyuria, or new focal neuro s/s.    Pt denies fever, wt loss, night sweats, loss of appetite, or other constitutional symptoms  No overt bleeding or bruising.         Wt Readings from Last 3 Encounters:  04/30/23 232 lb (105.2 kg)  03/05/23 227 lb (103 kg)  10/23/22 216 lb (98 kg)   BP Readings from Last 3 Encounters:  04/30/23 118/68  03/05/23 115/74  02/16/23 126/81         Past Medical History:  Diagnosis Date   Anemia 01/01/2017   Ascending aorta dilation (HCC)    Echocardiogram 2/22: EF 40-45, mild LVH, normal RVSF, mild to mod MR, mild to mod AI, ascending aorta 45 mm.     CAD (coronary artery disease)    a. Overlapping BMS-prox LCx in 2009 b. NSTEMI 2012 due to prox LCx ISR s/p PTCA alone; EF 45% w/ basal inf HK. Had run out of Plavix 1 week prior. c. NSTEMI 05/2015 after running out of Plavix - cath with 3V CAD, Patient left AMA prior to agreeing to CABG. // d. LHC 8/16: LM 60; LAD mid 60, D1 75; RI 50, 80; LCx prox stent 100; RCA prox 100; EF 45-50 >> seen by [facility] - declined CABG >> Med Rx.   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   Hypertension    MI (myocardial infarction) (HCC) x 7   Poor compliance    Seizure disorder (HCC)    Seizures (HCC)    TOBACCO ABUSE 02/22/2009   Valvular heart disease    a. Echo 05/2015: mild AI, mod MR.   Past Surgical History:  Procedure Laterality Date   CARDIAC CATHETERIZATION [name] 05/29/2015   Procedure: Left Heart Cath and Coronary Angiography;  Surgeon: Lyn Records, MD;  Location: [facility] INVASIVE CV LAB;  Service: Cardiovascular;  Laterality: [name];   COLONOSCOPY     CORONARY ANGIOPLASTY  03/2011   PTCA-prox LCx ISR   CORONARY ANGIOPLASTY WITH STENT PLACEMENT  10/2007   BMS-prox LCx   LEFT HEART CATHETERIZATION WITH CORONARY ANGIOGRAM [name] 10/13/2013   Procedure: LEFT HEART CATHETERIZATION WITH CORONARY ANGIOGRAM;  Surgeon: Micheline Chapman, MD;  Location: [facility] CATH LAB;  Service: Cardiovascular;  Laterality: [name];   TEE WITHOUT CARDIOVERSION [name] 06/16/2015   Procedure: TRANSESOPHAGEAL ECHOCARDIOGRAM (TEE);  Surgeon: Wendall Stade, MD;  Location: [facility] ENDOSCOPY;  Service: Cardiovascular;  Laterality: [name];    reports that he has quit smoking. His smoking use included cigars. He has never used smokeless tobacco. He reports that he does not drink alcohol and does not use drugs. family history includes Coronary artery disease in his maternal aunt; Heart attack in his maternal uncle; Heart disease in his maternal uncle; Hypertension in an other family member; Stroke in his maternal uncle. Allergies  Allergen Reactions   Spironolactone Diarrhea   Current Outpatient Medications on File Prior to Visit  Medication Sig Dispense Refill   albuterol (VENTOLIN HFA) 108 (90 Base) MCG/ACT inhaler Inhale 2 puffs into the lungs every 6 (six) hours as needed for wheezing or shortness of breath. 8 g 2   carvedilol (COREG) 3.125 MG tablet TAKE 1 TABLET(3.125 MG) BY MOUTH TWICE DAILY WITH A MEAL 180 tablet 2   Cholecalciferol (VITAMIN D3) 50 MCG (2000 UT)  capsule Take 1 capsule (2,000 Units total) by mouth daily. 90 capsule 99   clopidogrel (PLAVIX) 75 MG tablet TAKE 1 TABLET(75 MG) BY MOUTH DAILY 90 tablet 2   doxycycline (VIBRAMYCIN) 100 MG capsule Take 1 capsule (100 mg total) by mouth 2 (two) times daily. 20 capsule 0   ezetimibe (ZETIA) 10 MG tablet TAKE 1 TABLET(10 MG) BY MOUTH DAILY 90 tablet 2   ferrous sulfate 325 (65 FE) MG tablet Take 1 tablet (325 mg total) by mouth daily with breakfast. 90 tablet 3   furosemide (LASIX) 40 MG tablet TAKE 1 TABLET BY MOUTH EVERY DAY AS NEEDED FOR EDEMA(TAKE FOR WEIGHT GAIN OVER 3 POUNDS IN 1 DAY OR LEG SWELLING) 90 tablet 3   isosorbide mononitrate (IMDUR) 30 MG 24 hr tablet TAKE A 1/2 TABLET BY MOUTH DAILY. 45 tablet 1   levETIRAcetam (KEPPRA) 250 MG tablet Take 1 tablet (250 mg total) by mouth daily. 1 tablet daily x 1 month and stop 60 tablet 11   lisinopril (ZESTRIL) 2.5 MG tablet TAKE 1 TABLET(2.5 MG) BY MOUTH DAILY 90 tablet 2   Multiple Vitamin (ONE-A-DAY MENS PO) Take 1 tablet by mouth daily.     Niacin (VITAMIN B-3 PO) Take 1 tablet by mouth daily.     nitroGLYCERIN (NITROSTAT) 0.4 MG SL tablet Place 1 tablet (0.4 mg total) under the tongue every 5 (five) minutes as needed for chest pain. place 1 tablet under the tongue if needed every 5 minutes for chest pain for 3 doses Strength: 0.4 mg 25 tablet 8   PHENobarbital (LUMINAL) 32.4 MG tablet TAKE 1 TABLET(32.4 MG) BY MOUTH FOUR TIMES DAILY 360 tablet 1   rosuvastatin (CRESTOR) 40 MG tablet TAKE 1 TABLET BY MOUTH EVERY DAY 90 tablet 2   TURMERIC PO Take 1 capsule by mouth daily.     vitamin B-12 (CYANOCOBALAMIN) 1000 MCG tablet Take 1,000 mcg by mouth daily.     zinc gluconate 50 MG tablet Take 50 mg by mouth daily.     No current facility-administered medications on file prior to visit.        ROS:  All others reviewed and negative.  Objective        PE:  BP 118/68 (BP Location: Left Arm, Patient Position: Sitting, Cuff Size: Normal)    Pulse 70   Temp 98.9 F (37.2 C) (Oral)   Ht 6\' 2"  (1.88 m)   Wt 232 lb (105.2 kg)   SpO2 98%   BMI 29.79 kg/m                 Constitutional:  Pt appears in NAD               HENT: Head: NCAT.                Right Ear: External ear normal.                 Left Ear: External ear normal.                Eyes: . Pupils are equal, round, and reactive to light. Conjunctivae and EOM are normal               Nose: without d/c or deformity               Neck: Neck supple. Gross normal ROM               Cardiovascular: Normal rate and regular rhythm.                 Pulmonary/Chest: Effort normal and breath sounds without rales or wheezing.                Abd:  Soft, NT, ND, + BS, no organomegaly               Neurological: Pt is alert. At baseline orientation, motor grossly intact               Skin: Skin is warm. No rashes, no other new lesions, LE edema - none               Psychiatric: Pt behavior is normal without agitation   Micro: none  Cardiac tracings I have personally interpreted today:  none  Pertinent Radiological findings (summarize): none   Lab Results  Component Value Date   WBC 6.7 04/30/2023   HGB 9.9 (L) 04/30/2023   HCT 29.8 (L) 04/30/2023   PLT 412.0 (H) 04/30/2023   GLUCOSE 97 04/30/2023   CHOL 126 04/30/2023   TRIG 98.0 04/30/2023   HDL 36.80 (L) 04/30/2023   LDLCALC 70 04/30/2023   ALT 17 04/30/2023   AST 19 04/30/2023   NA 138 04/30/2023   K 4.5 04/30/2023   CL 107 04/30/2023   CREATININE 1.33 04/30/2023   BUN 12 04/30/2023   CO2 25 04/30/2023   TSH 2.12 10/23/2022   PSA 2.52 10/23/2022   INR 1.01 05/27/2015   HGBA1C 6.9 (H) 04/30/2023   MICROALBUR <0.7 10/23/2022   Assessment/Plan:  [name] is a 62 y.o. Black or African American [2] male with  has a past medical history of Anemia (01/01/2017), Ascending aorta dilation (HCC), CAD (coronary artery disease), Clotting disorder (HCC), Diabetes mellitus without complication (HCC), HFmrEF (heart  failure with mildy reduced EF) (06/06/2015), HLD (hyperlipidemia), Hyperglycemia (A1C 6.0 05/2015.), Hypertension, MI (myocardial infarction) (HCC) (x 7), Poor compliance, Seizure disorder (HCC), Seizures (HCC), TOBACCO ABUSE (02/22/2009), and Valvular heart disease.  Anemia due to chronic illness Mild to mod worsening in the past 6 mo, for f/u lab with iron level,  to f/u any worsening symptoms or concerns  CKD (chronic kidney disease) stage 3, GFR 30-59 ml/min (HCC) Lab Results  Component Value Date   CREATININE 1.33 04/30/2023   Stable overall, cont to avoid nephrotoxins   Essential hypertension BP Readings from Last 3 Encounters:  04/30/23 118/68  03/05/23 115/74  02/16/23 126/81   Stable, pt to continue medical treatment coreg 3,125 every day, lisinopril 2.5 qd   Uncontrolled type 2 diabetes mellitus with hyperglycemia (HCC) Lab  Results  Component Value Date   HGBA1C 6.9 (H) 04/30/2023   Stable, pt to continue current medical treatment  - diet, wt control   Vitamin D deficiency Last vitamin D Lab Results  Component Value Date   VD25OH 67.22 10/23/2022   Stable, cont oral replacement   Pure hypercholesterolemia Lab Results  Component Value Date   LDLCALC 70 04/30/2023   Uncontrolled, goal ldl < 70, pt to continue crestor 40 mg and lower chol diet as declines other change  Followup: Return in about 6 months (around 10/31/2023).  Oliver Barre, MD 05/03/2023 5:10 PM [facility] Internal Medicine

## 2023-04-30 NOTE — Patient Instructions (Signed)

## 2023-05-03 ENCOUNTER — Encounter: Payer: Self-pay | Admitting: Internal Medicine

## 2023-05-03 NOTE — Assessment & Plan Note (Signed)
Last vitamin D Lab Results  Component Value Date   VD25OH 67.22 10/23/2022   Stable, cont oral replacement

## 2023-05-03 NOTE — Assessment & Plan Note (Signed)
BP Readings from Last 3 Encounters:  04/30/23 118/68  03/05/23 115/74  02/16/23 126/81   Stable, pt to continue medical treatment coreg 3,125 every day, lisinopril 2.5 qd

## 2023-05-03 NOTE — Assessment & Plan Note (Addendum)
Mild to mod worsening in the past 6 mo, for f/u lab with iron level,  to f/u any worsening symptoms or concerns

## 2023-05-03 NOTE — Assessment & Plan Note (Signed)
Lab Results  Component Value Date   CREATININE 1.33 04/30/2023   Stable overall, cont to avoid nephrotoxins

## 2023-05-03 NOTE — Assessment & Plan Note (Signed)
Lab Results  Component Value Date   HGBA1C 6.9 (H) 04/30/2023   Stable, pt to continue current medical treatment  - diet, wt control

## 2023-05-03 NOTE — Assessment & Plan Note (Signed)
Lab Results  Component Value Date   LDLCALC 70 04/30/2023   Uncontrolled, goal ldl < 70, pt to continue crestor 40 mg and lower chol diet as declines other change

## 2023-05-12 ENCOUNTER — Encounter: Payer: Self-pay | Admitting: Nurse Practitioner

## 2023-05-26 DIAGNOSIS — R3915 Urgency of urination: Secondary | ICD-10-CM | POA: Diagnosis not present

## 2023-05-26 DIAGNOSIS — R310 Gross hematuria: Secondary | ICD-10-CM | POA: Diagnosis not present

## 2023-05-26 DIAGNOSIS — R3912 Poor urinary stream: Secondary | ICD-10-CM | POA: Diagnosis not present

## 2023-06-25 ENCOUNTER — Other Ambulatory Visit: Payer: Self-pay | Admitting: Neurology

## 2023-06-30 MED ORDER — PHENOBARBITAL 32.4 MG PO TABS: 32.4000 mg | ORAL_TABLET | Freq: Four times a day (QID) | ORAL | 1 refills | Status: DC

## 2023-06-30 NOTE — Telephone Encounter (Signed)
It looks as if Dr Pearlean Brownie wanted this patient to taper Medication?  He ordered 11 refills in May 2024, why would the patient need refills now ?

## 2023-06-30 NOTE — Addendum Note (Signed)
Addended by: Melvyn Novas on: 06/30/2023 05:20 PM   Modules accepted: Orders

## 2023-07-04 ENCOUNTER — Other Ambulatory Visit: Payer: Self-pay | Admitting: Nurse Practitioner

## 2023-07-04 ENCOUNTER — Other Ambulatory Visit: Payer: Self-pay | Admitting: Internal Medicine

## 2023-07-04 ENCOUNTER — Other Ambulatory Visit: Payer: Self-pay

## 2023-07-28 ENCOUNTER — Other Ambulatory Visit (INDEPENDENT_AMBULATORY_CARE_PROVIDER_SITE_OTHER): Payer: 59

## 2023-07-28 ENCOUNTER — Ambulatory Visit (INDEPENDENT_AMBULATORY_CARE_PROVIDER_SITE_OTHER): Payer: 59 | Admitting: Nurse Practitioner

## 2023-07-28 ENCOUNTER — Telehealth: Payer: Self-pay

## 2023-07-28 ENCOUNTER — Encounter: Payer: Self-pay | Admitting: Nurse Practitioner

## 2023-07-28 VITALS — BP 110/68 | HR 78 | Ht 74.0 in | Wt 237.5 lb

## 2023-07-28 DIAGNOSIS — D509 Iron deficiency anemia, unspecified: Secondary | ICD-10-CM

## 2023-07-28 DIAGNOSIS — I2510 Atherosclerotic heart disease of native coronary artery without angina pectoris: Secondary | ICD-10-CM

## 2023-07-28 LAB — CBC
HCT: 30.7 % — ABNORMAL LOW (ref 39.0–52.0)
Hemoglobin: 10.1 g/dL — ABNORMAL LOW (ref 13.0–17.0)
MCHC: 33.1 g/dL (ref 30.0–36.0)
MCV: 86.8 fL (ref 78.0–100.0)
Platelets: 416 10*3/uL — ABNORMAL HIGH (ref 150.0–400.0)
RBC: 3.53 Mil/uL — ABNORMAL LOW (ref 4.22–5.81)
RDW: 17.5 % — ABNORMAL HIGH (ref 11.5–15.5)
WBC: 8 10*3/uL (ref 4.0–10.5)

## 2023-07-28 NOTE — Patient Instructions (Addendum)
Cardiac clearance is required prior to scheduling your EGD & colonoscopy.  Due to recent changes in healthcare laws, you may see the results of your imaging and laboratory studies on MyChart before your provider has had a chance to review them.  We understand that in some cases there may be results that are confusing or concerning to you. Not all laboratory results come back in the same time frame and the provider may be waiting for multiple results in order to interpret others.  Please give [place] 48 hours in order for your provider to thoroughly review all the results before contacting the office for clarification of your results.   Thank you for trusting me with your gastrointestinal care!   Alcide Evener, CRNP

## 2023-07-28 NOTE — Telephone Encounter (Signed)
[facility] [address] [place], [place]  [address] Phone:  [phone]   Fax:  [phone]   BRODIN GELPI DOB: April 10, 1961 MRN: [id number]  Dear: Dr.Cooper:   The patient above is scheduled for a EGD & colonoscopy in the near future under general anesthesia (Propofol). Plavix will also need to be held 5 days prior.   Please fax or route a note of Medical Clearance & Plavix hold to [phone], Attn: Cathlene Gardella, CMA   Please advise if this patient will require an office visit or further medical work-up before clearance can be given.   Thank you,     Gastroenterology

## 2023-07-28 NOTE — Progress Notes (Addendum)
07/28/2023 [name] [id number] Aug 09, 1961   CHIEF COMPLAINT: Anemia   HISTORY OF PRESENT ILLNESS: [name] is a 62 year old male with a past medical history of hypertension, CAD s/p NSTEMI 2012, s/p STEMI 05/2015 when off Plavix x 1 week, s/p numerous PCI's (patient endorsed having a total of 9 coronary stents, "10 heart attacks" and cardiac arrested x 3), seizure disorder (no seizures since 09/2014), CKD and diverticulosis. He presents to our office today as referred by Dr. Oliver Barre for their evaluation regarding iron deficiency anemia.  He is known by Dr. Myrtie Neither. He denies having any nausea or vomiting.  No dysphagia or heartburn.  No upper or lower abdominal pain.  He typically passes a normal brown formed bowel movement daily.  No rectal bleeding or black stools.  No known family history of upper GI or colorectal cancer.  Laboratory studies 04/30/2023 showed a hemoglobin level of 9.9.  Hematocrit 29.8.  MCV 86.5.  Platelet 412.  Iron 50.  TIBC 385.  Saturation ratios 13.  He was last seen by Dr. Myrtie Neither in office 12/31/2018 for further evaluation regarding B12 and iron deficiency anemia.  An EGD and colonoscopy were recommended at that time but the patient did not have a care partner who could companied him to his procedures.     Latest Ref Rng & Units 04/30/2023    3:36 PM 02/16/2023    6:51 PM 10/23/2022    4:27 PM  CBC  WBC 4.0 - 10.5 K/uL 6.7  7.0  7.5   Hemoglobin 13.0 - 17.0 g/dL 9.9  9.1  32.4   Hematocrit 39.0 - 52.0 % 29.8  28.8  30.8   Platelets 150.0 - 400.0 K/uL 412.0  368  405.0        Latest Ref Rng & Units 04/30/2023    3:36 PM 02/16/2023    6:51 PM 10/23/2022    4:27 PM  CMP  Glucose 70 - 99 mg/dL 97  92  93   BUN 6 - 23 mg/dL 12  19  18    Creatinine 0.40 - 1.50 mg/dL 4.01  0.27  2.53   Sodium 135 - 145 mEq/L 138  138  137   Potassium 3.5 - 5.1 mEq/L 4.5  4.4  4.7   Chloride 96 - 112 mEq/L 107  103  104   CO2 19 - 32 mEq/L 25  22  26    Calcium 8.4 - 10.5  mg/dL 9.3  9.2  9.2   Total Protein 6.0 - 8.3 g/dL 8.1   7.6   Total Bilirubin 0.2 - 1.2 mg/dL 0.2   0.3   Alkaline Phos 39 - 117 U/L 82   70   AST 0 - 37 U/L 19   19   ALT 0 - 53 U/L 17   21   B12 level 403 on 11/09/2022  ECHO 12/08/2020: 1. Left ventricular ejection fraction, by estimation, is 40 to 45%. The left ventricle has mildly decreased function. The left ventricle demonstrates global hypokinesis. The left ventricular internal cavity size was moderately dilated. There is mild concentric left ventricular hypertrophy. Left ventricular diastolic parameters are indeterminate. 2. Right ventricular systolic function is normal. The right ventricular size is normal. 3. The mitral valve is normal in structure. Mild to moderate mitral valve regurgitation. No evidence of mitral stenosis. 4. The aortic valve is normal in structure. Aortic valve regurgitation is mild to moderate. No aortic stenosis is present. 5. Aortic dilatation  noted. There is moderate dilatation of the ascending aorta, measuring 45 mm. 6. The inferior vena cava is normal in size with greater than 50% respiratory variability, suggesting right atrial pressure of 3 mmHg.  Cardiac catheterization 05/29/2015: Prox Cx to Mid Cx lesion, 100% stenosed. The lesion was previously treated with a stent (unknown type) . Ramus-1 lesion, 50% stenosed. Ramus-2 lesion, 80% stenosed. 1st Diag lesion, 75% stenosed. Mid LAD lesion, 60% stenosed. LM lesion, 60% stenosed. Prox RCA to Mid RCA lesion, 100% stenosed. Mid RCA lesion, 100% stenosed.   Moderate distal left main (50-60% depending upon views), moderate mid LAD, moderately severe first diagonal, severe obstruction of the ramus intermedius/first obtuse marginal, and total occlusion of the RCA. The previously placed stent in the proximal to mid circumflex is totally occluded. The distal obtuse marginals fill by left to right and right to right collaterals. Moderate inferior wall hypokinesis. EF  50%   PAST GI PROCEDURES:  Colonoscopy 09/07/2013 by Dr. Arlyce Dice:  Sessile polyp measuring 2 mm in size was found in the rectum, polypectomy was performed with cold forceps Mild diverticulosis was noted in the descending colon The colon mucosa was otherwise normal Path report showed 9 colonic mucosa Recall colonoscopy 10 years   Past Medical History:  Diagnosis Date   Anemia 01/01/2017   Ascending aorta dilation (HCC)    Echocardiogram 2/22: EF 40-45, mild LVH, normal RVSF, mild to mod MR, mild to mod AI, ascending aorta 45 mm.     CAD (coronary artery disease)    a. Overlapping BMS-prox LCx in 2009 b. NSTEMI 2012 due to prox LCx ISR s/p PTCA alone; EF 45% w/ basal inf HK. Had run out of Plavix 1 week prior. c. NSTEMI 05/2015 after running out of Plavix - cath with 3V CAD, Patient left AMA prior to agreeing to CABG. // d. LHC 8/16: LM 60; LAD mid 60, D1 75; RI 50, 80; LCx prox stent 100; RCA prox 100; EF 45-50 >> seen by [facility] - declined CABG >> Med Rx.   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   Hypertension    MI (myocardial infarction) (HCC) x 7   Poor compliance    Seizure disorder (HCC)    Seizures (HCC)    TOBACCO ABUSE 02/22/2009   Valvular heart disease    a. Echo 05/2015: mild AI, mod MR.   Past Surgical History:  Procedure Laterality Date   CARDIAC CATHETERIZATION [name] 05/29/2015   Procedure: Left Heart Cath and Coronary Angiography;  Surgeon:  Lyn Records, MD;  Location: [facility] INVASIVE CV LAB;  Service: Cardiovascular;  Laterality: [name];   COLONOSCOPY     CORONARY ANGIOPLASTY  03/2011   PTCA-prox LCx ISR   CORONARY ANGIOPLASTY WITH STENT PLACEMENT  10/2007   BMS-prox LCx   LEFT HEART CATHETERIZATION WITH CORONARY ANGIOGRAM [name] 10/13/2013   Procedure:  LEFT HEART CATHETERIZATION WITH CORONARY ANGIOGRAM;  Surgeon: Micheline Chapman, MD;  Location: [facility] CATH LAB;  Service: Cardiovascular;  Laterality: [name];   TEE WITHOUT CARDIOVERSION [name] 06/16/2015   Procedure: TRANSESOPHAGEAL ECHOCARDIOGRAM (TEE);  Surgeon: Wendall Stade, MD;  Location: [facility] ENDOSCOPY;  Service: Cardiovascular;  Laterality: [name];   Social History: He is single.  Unemployed.  Past smoker.  No alcohol use or drug use.   Family History: family history includes Atrial fibrillation in his brother; Coronary artery disease in his maternal aunt; Heart attack in his maternal uncle; Heart disease in his maternal uncle; Hypertension in his brother and another family member; Stroke in his maternal uncle.  No known family history of esophageal, gastric or colon cancer   Allergies  Allergen Reactions   Spironolactone Diarrhea      Outpatient Encounter Medications as of 07/28/2023  Medication Sig   albuterol (VENTOLIN HFA) 108 (90 Base) MCG/ACT inhaler INHALE 2 PUFFS INTO THE LUNGS EVERY 6 HOURS AS NEEDED FOR WHEEZING OR SHORTNESS OF BREATH   carvedilol (COREG) 3.125 MG tablet TAKE 1 TABLET(3.125 MG) BY MOUTH TWICE DAILY WITH A MEAL   Cholecalciferol (VITAMIN D3) 50 MCG (2000 UT) capsule Take 1 capsule (2,000 Units total) by mouth daily.   clopidogrel (PLAVIX) 75 MG tablet TAKE 1 TABLET(75 MG) BY MOUTH DAILY   ezetimibe (ZETIA) 10 MG tablet TAKE 1 TABLET(10 MG) BY MOUTH DAILY   ferrous sulfate 325 (65 FE) MG tablet Take 1 tablet (325 mg total) by mouth daily with breakfast.   furosemide (LASIX) 40 MG tablet TAKE 1 TABLET BY MOUTH EVERY DAY AS NEEDED FOR EDEMA(TAKE FOR WEIGHT GAIN OVER 3  POUNDS IN 1 DAY OR LEG SWELLING)   iron polysaccharides (NU-IRON) 150 MG capsule Take 1 capsule (150 mg total) by mouth daily.   isosorbide mononitrate (IMDUR) 30 MG 24 hr tablet TAKE A 1/2 TABLET BY MOUTH DAILY.   levETIRAcetam (KEPPRA) 250 MG tablet Take 1 tablet (250 mg total) by mouth daily. 1 tablet daily x 1 month and stop   lisinopril (ZESTRIL) 2.5 MG tablet TAKE 1 TABLET(2.5 MG) BY MOUTH DAILY   Multiple Vitamin (ONE-A-DAY MENS PO) Take 1 tablet by mouth daily.   Niacin (VITAMIN B-3 PO) Take 1 tablet by mouth daily.   PHENobarbital (LUMINAL) 32.4 MG tablet Take 1 tablet (32.4 mg total) by mouth 4 (four) times daily.   rosuvastatin (CRESTOR) 40 MG tablet TAKE 1 TABLET BY MOUTH EVERY DAY   tamsulosin (FLOMAX) 0.4 MG CAPS capsule Take 0.4 mg by mouth daily.   TURMERIC PO Take 1 capsule by mouth daily.   vitamin B-12 (CYANOCOBALAMIN) 1000 MCG tablet Take 1,000 mcg by mouth daily.   zinc gluconate 50 MG tablet Take 50 mg by mouth daily.   [DISCONTINUED] doxycycline (VIBRAMYCIN) 100 MG capsule Take 1 capsule (100 mg total) by mouth 2 (two) times daily.   [DISCONTINUED] nitroGLYCERIN (NITROSTAT) 0.4 MG SL tablet PLACE 1 TABLET UNDER TONGUE EVERY 5 MINS AS NEEDED FOR CHEST PAIN. FOR 3 DOSES   No facility-administered encounter medications on file as of 07/28/2023.    REVIEW OF SYSTEMS:  Gen: Denies fever, sweats or chills. No weight loss.  CV: Denies chest pain, palpitations or edema. Resp: Denies cough, shortness of breath of hemoptysis.  GI:See HPI. GU: Denies urinary burning, blood in urine, increased urinary frequency or incontinence. MS: Denies joint pain, muscles aches or weakness. Derm: Denies rash, itchiness, skin lesions or unhealing ulcers. Psych: Denies depression, anxiety, memory loss  or confusion. Heme: Denies bruising, easy bleeding. Neuro:  Denies headaches, dizziness or paresthesias. Endo:  Denies any problems with DM, thyroid or adrenal function.  PHYSICAL EXAM: BP  110/68 (BP Location: Left Arm, Patient Position: Sitting, Cuff Size: Large)   Pulse 78   Ht 6\' 2"  (1.88 m)   Wt 237 lb 8 oz (107.7 kg)   BMI 30.49 kg/m  General: 62 year old male in no acute distress. Head: Normocephalic and atraumatic. Eyes:  Sclerae non-icteric, conjunctive pink. Ears: Normal auditory acuity. Mouth: Absent dentition. No ulcers or lesions.  Neck: Supple, no lymphadenopathy or thyromegaly.  Lungs: Clear bilaterally to auscultation without wheezes, crackles or rhonchi. Heart: Regular rate and rhythm. Systolic murmur. No rub or gallop appreciated.  Abdomen: Soft, nontender, nondistended. No masses. No hepatosplenomegaly. Normoactive bowel sounds x 4 quadrants.  Rectal: Deferred.  Musculoskeletal: Symmetrical with no gross deformities. Skin: Warm and dry. No rash or lesions on visible extremities. Extremities: No edema. Neurological: Alert oriented x 4, no focal deficits.  Psychological:  Alert and cooperative. Normal mood and affect.  ASSESSMENT AND PLAN:  62 year old male with iron deficiency anemia on Plavix.  Asymptomatic.  -CBC, IBC + Ferritin level  -Cardiac clearance to also include Plavix hold instructions required prior to scheduling EGD and colonoscopy.  Due to his significant cardiac history, EGD and colonoscopy may need to be scheduled in the hospital setting if pursued, to discuss further with Dr. Myrtie Neither -EGD and colonoscopy benefits and risks discussed including risk with sedation, risk of bleeding, perforation and infection   CAD, s/p numerous MI's, PCI's. Patient previously declined CABG. No angina. On Plavix.   HFmrEF. LV EF 40 - 45%  CKD  History of seizure disorder, last seizure was in 2015   ADDENDUM: Cardiac clearance including Plavix hold instructions completed by Dr. Tonny Bollman 08/29/2023 as follows: Pre-op evaluation Patient is at low risk of complication related to EGD and colonoscopy.  He is not having any active anginal or heart  failure symptoms.  He can hold clopidogrel 5 to 7 days before the procedure.  See phone note 09/11/2023: EGD and colonoscopy to be scheduled at [facility] with Dr. Myrtie Neither.   CC:  Corwin Levins, MD

## 2023-07-29 LAB — COMPREHENSIVE METABOLIC PANEL
ALT: 15 U/L (ref 0–53)
AST: 14 U/L (ref 0–37)
Albumin: 4.3 g/dL (ref 3.5–5.2)
Alkaline Phosphatase: 90 U/L (ref 39–117)
BUN: 16 mg/dL (ref 6–23)
CO2: 18 meq/L — ABNORMAL LOW (ref 19–32)
Calcium: 9.5 mg/dL (ref 8.4–10.5)
Chloride: 108 meq/L (ref 96–112)
Creatinine, Ser: 1.47 mg/dL (ref 0.40–1.50)
GFR: 51.22 mL/min — ABNORMAL LOW (ref >60.00)
Glucose, Bld: 130 mg/dL — ABNORMAL HIGH (ref 70–99)
Potassium: 4.3 meq/L (ref 3.5–5.1)
Sodium: 142 meq/L (ref 135–145)
Total Bilirubin: 0.2 mg/dL (ref 0.2–1.2)
Total Protein: 8.4 g/dL — ABNORMAL HIGH (ref 6.0–8.3)

## 2023-07-29 LAB — IBC + FERRITIN
Ferritin: 33.8 ng/mL (ref 22.0–322.0)
Iron: 57 ug/dL (ref 42–165)
Saturation Ratios: 15.5 % — ABNORMAL LOW (ref 20.0–50.0)
TIBC: 366.8 ug/dL (ref 250.0–450.0)
Transferrin: 262 mg/dL (ref 212.0–360.0)

## 2023-07-29 NOTE — Telephone Encounter (Signed)
Name: [name]  DOB: [birth year]  MRN: [id number]  Primary Cardiologist: Tonny Bollman, MD  Chart reviewed as part of pre-operative protocol coverage. The patient has an upcoming visit scheduled with Dr. Excell Seltzer on 11/08/2024at which time clearance can be addressed in case there are any issues that would impact surgical recommendations.  EGD/Colonoscopy Is not scheduled until TBD as below. I added preop FYI to appointment note so that provider is aware to address at time of outpatient visit.  Per office protocol the cardiology provider should forward their finalized clearance decision and recommendations regarding antiplatelet therapy to the requesting party below.    This message will also be routed to pharmacy pool and/or Dr Copper for input on holding Plavix as requested below so that this information is available to the clearing provider at time of patient's appointment.   I will route this message as FYI to requesting party and remove this message from the preop box as separate preop APP input not needed at this time.   Please call with any questions.  Joni Reining, NP  07/29/2023, 7:40 AM

## 2023-07-29 NOTE — Telephone Encounter (Signed)
Faxed over clearance over to [facility] to make them aware of appointment in November and clearance will be addressed at that time.

## 2023-08-03 NOTE — Progress Notes (Signed)
____________________________________________________________  Attending physician addendum:  Thank you for sending this case to me. I have reviewed the entire note and agree with the plan.  He should have his procedures done in the hospital outpatient endoscopy lab due to the severity of his coronary artery disease with multiple prior interventions.  His iron levels are recently normalized after treatment but his hemoglobin remains low.  Other factors may therefore be contributing to his anemia beyond iron deficiency.  He has CKD and he has not had a folic acid level checked in years.  His last B12 level was normal earlier this year.  Amada Jupiter, MD  ____________________________________________________________

## 2023-08-08 ENCOUNTER — Other Ambulatory Visit: Payer: Self-pay | Admitting: Nurse Practitioner

## 2023-08-08 DIAGNOSIS — D509 Iron deficiency anemia, unspecified: Secondary | ICD-10-CM

## 2023-08-09 ENCOUNTER — Other Ambulatory Visit: Payer: Self-pay | Admitting: Cardiovascular Disease

## 2023-08-29 ENCOUNTER — Ambulatory Visit: Payer: 59 | Attending: Cardiovascular Disease | Admitting: Cardiovascular Disease

## 2023-08-29 ENCOUNTER — Encounter: Payer: Self-pay | Admitting: Cardiovascular Disease

## 2023-08-29 VITALS — BP 120/60 | HR 78 | Ht 74.0 in | Wt 240.4 lb

## 2023-08-29 DIAGNOSIS — I340 Nonrheumatic mitral (valve) insufficiency: Secondary | ICD-10-CM

## 2023-08-29 DIAGNOSIS — I25119 Atherosclerotic heart disease of native coronary artery with unspecified angina pectoris: Secondary | ICD-10-CM | POA: Diagnosis not present

## 2023-08-29 DIAGNOSIS — I77810 Thoracic aortic ectasia: Secondary | ICD-10-CM | POA: Diagnosis not present

## 2023-08-29 DIAGNOSIS — Z01818 Encounter for other preprocedural examination: Secondary | ICD-10-CM | POA: Diagnosis not present

## 2023-08-29 DIAGNOSIS — I5020 Unspecified systolic (congestive) heart failure: Secondary | ICD-10-CM | POA: Diagnosis not present

## 2023-08-29 DIAGNOSIS — E785 Hyperlipidemia, unspecified: Secondary | ICD-10-CM | POA: Diagnosis not present

## 2023-08-29 DIAGNOSIS — I10 Essential (primary) hypertension: Secondary | ICD-10-CM

## 2023-08-29 NOTE — Assessment & Plan Note (Signed)
The ascending aorta has measured 45 mm on previous echo.  Will arrange a CTA of the chest for radiographic follow-up.

## 2023-08-29 NOTE — Patient Instructions (Signed)
Testing/Procedures: **Cleared for upcoming procedure**  Follow-Up: At [facility], you and your health needs are our priority.  As part of our continuing mission to provide you with exceptional heart care, we have created designated Provider Care Teams.  These Care Teams include your primary Cardiologist (physician) and Advanced Practice Providers (APPs -  Physician Assistants and Nurse Practitioners) who all work together to provide you with the care you need, when you need it.  Your next appointment:   1 year(s)  Provider:   Tonny Bollman, MD       Other Instructions Adopting a Healthy Lifestyle.   Weight: Know what a healthy weight is for you (roughly BMI <25) and aim to maintain this. You can calculate your body mass index on your smart phone. Unfortunately, this is not the most accurate measure of healthy weight, but it is the simplest measurement to use. A more accurate measurement involves body scanning which measures lean muscle, fat tissue and bony density. We do not have this equipment at [facility].    Diet: Aim for 7+ servings of fruits and vegetables daily Limit animal fats in diet for cholesterol and heart health - choose grass fed whenever available Avoid highly processed foods (fast food burgers, tacos, fried chicken, pizza, hot dogs, french fries)  Saturated fat comes in the form of butter, lard, coconut oil, margarine, partially hydrogenated oils, and fat in meat. These increase your risk of cardiovascular disease.  Use healthy plant oils, such as olive, canola, soy, corn, sunflower and peanut.  Whole foods such as fruits, vegetables and whole grains have fiber  Men need > 38 grams of fiber per day Women need > 25 grams of fiber per day  Load up on vegetables and fruits - one-half of your plate: Aim for color and variety, and remember that potatoes dont count. Go for whole grains - one-quarter of your plate: Whole wheat, barley, wheat berries, quinoa, oats,  brown rice, and foods made with them. If you want pasta, go with whole wheat pasta. Protein power - one-quarter of your plate: Fish, chicken, beans, and nuts are all healthy, versatile protein sources. Limit red meat. You need carbohydrates for energy! The type of carbohydrate is more important than the amount. Choose carbohydrates such as vegetables, fruits, whole grains, beans, and nuts in the place of white rice, white pasta, potatoes (baked or fried), macaroni and cheese, cakes, cookies, and donuts.  If youre thirsty, drink water. Coffee and tea are good in moderation, but skip sugary drinks and limit milk and dairy products to one or two daily servings. Keep sugar intake at 6 teaspoons or 24 grams or LESS       Exercise: Aim for 150 min of moderate intensity exercise weekly for heart health, and weights twice weekly for bone health Stay active - any steps are better than no steps! Aim for 7-9 hours of sleep daily

## 2023-08-29 NOTE — Assessment & Plan Note (Signed)
Mild mitral regurgitation, not clinically significant.  No murmur audible on his exam today.

## 2023-08-29 NOTE — Assessment & Plan Note (Signed)
Continue carvedilol and isosorbide for antianginal therapy.  Continue clopidogrel for antiplatelet therapy with ability to hold this for procedures as outlined above.

## 2023-08-29 NOTE — Progress Notes (Signed)
Cardiology Office Note:    Date:  08/29/2023   ID:  [name], DOB September 19, 1961, MRN [id number]  PCP:  Corwin Levins, MD   [facility] Providers Cardiologist:  Tonny Bollman, MD Cardiology APP:  Kennon Rounds     Referring MD: Corwin Levins, MD   Chief Complaint  Patient presents with   Coronary Artery Disease    History of Present Illness:    [name] is a 62 y.o. male with a hx of:  Coronary artery disease S/p BMS x2 to the proximal LCx in 2009 NSTEMI in 2012 2/2 ISR >> PCI: POBA to LCx NSTEMI in 2016>> cath: RCA 100, LCx 100, mod disease in LM and LAD - Pt declined CABG>>Med Rx Ischemic cardiomyopathy Heart failure with mildly reduced ejection fraction (HFmrEF) Echocardiogram 4/19: EF 40-45 Moderate mitral regurgitation  Hypertension Hyperlipidemia Seizure disorder Self-reported history of clotting disorder Tobacco use - quit in 2023 Nonadherence - improved compliance over time  The patient is here with her friend today.  He states that he has been doing pretty well from a cardiac standpoint.  He denies any chest pain, chest pressure, or shortness of breath.  He denies edema, orthopnea, PND, or heart palpitations.  He has been compliant with his medicines.  He still does not appear to be following a very good diet.  He has developed iron deficiency anemia and needs to undergo EGD and colonoscopy.  He presents today for clearance.   Current Medications: Current Meds  Medication Sig   albuterol (VENTOLIN HFA) 108 (90 Base) MCG/ACT inhaler INHALE 2 PUFFS INTO THE LUNGS EVERY 6 HOURS AS NEEDED FOR WHEEZING OR SHORTNESS OF BREATH   carvedilol (COREG) 3.125 MG tablet TAKE 1 TABLET(3.125 MG) BY MOUTH TWICE DAILY WITH A MEAL   Cholecalciferol (VITAMIN D3) 50 MCG (2000 UT) capsule Take 1 capsule (2,000 Units total) by mouth daily.   clopidogrel (PLAVIX) 75 MG tablet TAKE 1 TABLET(75 MG) BY MOUTH DAILY   ezetimibe (ZETIA) 10 MG tablet TAKE 1  TABLET(10 MG) BY MOUTH DAILY   furosemide (LASIX) 40 MG tablet TAKE 1 TABLET BY MOUTH EVERY DAY AS NEEDED FOR EDEMA(TAKE FOR WEIGHT GAIN OVER 3 POUNDS IN 1 DAY OR LEG SWELLING)   iron polysaccharides (NU-IRON) 150 MG capsule Take 1 capsule (150 mg total) by mouth daily.   isosorbide mononitrate (IMDUR) 30 MG 24 hr tablet TAKE A 1/2 TABLET BY MOUTH DAILY.   levETIRAcetam (KEPPRA) 250 MG tablet Take 1 tablet (250 mg total) by mouth daily. 1 tablet daily x 1 month and stop   lisinopril (ZESTRIL) 2.5 MG tablet TAKE 1 TABLET(2.5 MG) BY MOUTH DAILY   Multiple Vitamin (ONE-A-DAY MENS PO) Take 1 tablet by mouth daily.   Niacin (VITAMIN B-3 PO) Take 1 tablet by mouth daily.   nitroGLYCERIN (NITROSTAT) 0.4 MG SL tablet PLACE 1 TABLET UNDER THE TONGUE EVERY 5 MINUTES AS NEEDED FOR CHEST PAIN. MAX 3 DOSES   PHENobarbital (LUMINAL) 32.4 MG tablet Take 1 tablet (32.4 mg total) by mouth 4 (four) times daily.   rosuvastatin (CRESTOR) 40 MG tablet TAKE 1 TABLET BY MOUTH EVERY DAY   tamsulosin (FLOMAX) 0.4 MG CAPS capsule Take 0.4 mg by mouth daily.   TURMERIC PO Take 1 capsule by mouth daily.   vitamin B-12 (CYANOCOBALAMIN) 1000 MCG tablet Take 1,000 mcg by mouth daily.   zinc gluconate 50 MG tablet Take 50 mg by mouth daily.     Allergies:  Spironolactone   ROS:   Please see the history of present illness.    All other systems reviewed and are negative.  EKGs/Labs/Other Studies Reviewed:    The following studies were reviewed today: Cardiac Studies & Procedures   CARDIAC CATHETERIZATION  CARDIAC CATHETERIZATION 05/29/2015  Narrative 1. Prox Cx to Mid Cx lesion, 100% stenosed. The lesion was previously treated with a stent (unknown type) . 2. Ramus-1 lesion, 50% stenosed. 3. Ramus-2 lesion, 80% stenosed. 4. 1st Diag lesion, 75% stenosed. 5. Mid LAD lesion, 60% stenosed. 6. LM lesion, 60% stenosed. 7. Prox RCA to Mid RCA lesion, 100% stenosed. 8. Mid RCA lesion, 100% stenosed.   Moderate  distal left main (50-60% depending upon views), moderate mid LAD, moderately severe first diagonal, severe obstruction of the ramus intermedius/first obtuse marginal, and total occlusion of the RCA. The previously placed stent in the proximal to mid circumflex is totally occluded. The distal obtuse marginals fill by left to right and right to right collaterals.  Moderate inferior wall hypokinesis. EF 50%   Recommendations:   Hold Plavix  TCTS consultation for consideration of bypass surgery including SVG to RCA, SVG to distal circumflex, SVG to ramus intermedius, SVG to diagonal, and LIMA to LAD. Should it be determined of bypass surgery is not indicated or not possible, consider stenting of the ramus intermedius with continued medical therapy (This would be my second choice rather than the primary treatment option for this relatively young patient). I believe that bypass surgery would be a more durable treatment option given his social issues and comorbidities.  Findings Coronary Findings Diagnostic  Dominance: Co-dominant  Left Main The lesion is type C eccentric .  Left Anterior Descending  First Diagonal Branch calcified .  Second Diagonal Branch The vessel is small in size.  Ramus Intermedius  The lesion is type non-C .  Left Circumflex The vessel is small . The lesion was previously treated with a stent (unknown type) .  First Obtuse Marginal Branch The vessel is small in size.  Second Obtuse Marginal Branch The vessel is small in size.  Third Obtuse Marginal Branch The vessel is small in size. Collaterals 3rd Mrg filled by collaterals from Ramus.  Right Coronary Artery calcified . The lesion is type C .  First Right Posterolateral Branch Collaterals 1st RPL filled by collaterals from 1st Sept.  Intervention  No interventions have been documented.     ECHOCARDIOGRAM  ECHOCARDIOGRAM COMPLETE 12/08/2020  Narrative ECHOCARDIOGRAM REPORT    Patient  Name:   [name] Date of Exam: 12/08/2020 Medical Rec #:  [id number]      Height:       74.0 in Accession #:    [id number]     Weight:       228.8 lb Date of Birth:  1961/08/15      BSA:          2.301 m Patient Age:    59 years       BP:           134/74 mmHg Patient Gender: M              HR:           86 bpm. Exam Location:  [address]  Procedure: 2D Echo, Cardiac Doppler and Color Doppler  Indications:    I34.0 Mitral Regurgitation I50.20 Heart failure with reduced ejection fraction  History:        Patient has prior history of Echocardiogram examinations,  most recent 01/29/2018. Risk Factors:Diabetes, Dyslipidemia and Current Smoker. Coronary artery disease. NSTEMI x 2. Chronic systolic and diastolic heart failure.  Sonographer:    Daphine Deutscher RDCS Referring Phys: [id number] Ever[address] Bio WEAVER  IMPRESSIONS   1. Left ventricular ejection fraction, by estimation, is 40 to 45%. The left ventricle has mildly decreased function. The left ventricle demonstrates global hypokinesis. The left ventricular internal cavity size was moderately dilated. There is mild concentric left ventricular hypertrophy. Left ventricular diastolic parameters are indeterminate. 2. Right ventricular systolic function is normal. The right ventricular size is normal. 3. The mitral valve is normal in structure. Mild to moderate mitral valve regurgitation. No evidence of mitral stenosis. 4. The aortic valve is normal in structure. Aortic valve regurgitation is mild to moderate. No aortic stenosis is present. 5. Aortic dilatation noted. There is moderate dilatation of the ascending aorta, measuring 45 mm. 6. The inferior vena cava is normal in size with greater than 50% respiratory variability, suggesting right atrial pressure of 3 mmHg.  Comparison(s): A prior study was performed on 01/29/2018. Compared prior, improvement in ventricular dilation and mitral regurgitation, increase in aortic regurgitation.  The ascending aorta findings were note seen in prior study.  FINDINGS Left Ventricle: Left ventricular ejection fraction, by estimation, is 40 to 45%. The left ventricle has mildly decreased function. The left ventricle demonstrates global hypokinesis. The left ventricular internal cavity size was moderately dilated. There is mild concentric left ventricular hypertrophy. Left ventricular diastolic parameters are indeterminate.  Right Ventricle: The right ventricular size is normal. No increase in right ventricular wall thickness. Right ventricular systolic function is normal.  Left Atrium: Left atrial size was normal in size.  Right Atrium: Right atrial size was normal in size.  Pericardium: Trivial pericardial effusion is present.  Mitral Valve: Mechanism of regurgitation related to posterior restriction. The mitral valve is normal in structure. Mild to moderate mitral valve regurgitation. No evidence of mitral valve stenosis.  Tricuspid Valve: The tricuspid valve is normal in structure. Tricuspid valve regurgitation is not demonstrated. No evidence of tricuspid stenosis.  Aortic Valve: The aortic valve is normal in structure. There is mild aortic valve annular calcification. Aortic valve regurgitation is mild to moderate. Aortic regurgitation PHT measures 586 msec. No aortic stenosis is present.  Pulmonic Valve: The pulmonic valve was not well visualized. Pulmonic valve regurgitation is not visualized. No evidence of pulmonic stenosis.  Aorta: Ascending Aortic Height Index: 2.4 cm/m; low risk. Aortic dilatation noted. There is moderate dilatation of the ascending aorta, measuring 45 mm.  Venous: The inferior vena cava is normal in size with greater than 50% respiratory variability, suggesting right atrial pressure of 3 mmHg.  IAS/Shunts: The atrial septum is grossly normal.   LEFT VENTRICLE PLAX 2D LVIDd:         6.40 cm  Diastology LVIDs:         5.20 cm  LV e' medial:    5.77  cm/s LV PW:         1.10 cm  LV E/e' medial:  16.6 LV IVS:        1.10 cm  LV e' lateral:   8.05 cm/s LVOT diam:     2.20 cm  LV E/e' lateral: 11.9 LV SV:         64 LV SV Index:   28 LVOT Area:     3.80 cm   RIGHT VENTRICLE RV Basal diam:  3.60 cm RV S prime:     17.70 cm/s  TAPSE (M-mode): 2.0 cm  LEFT ATRIUM             Index       RIGHT ATRIUM           Index LA diam:        4.70 cm 2.04 cm/m  RA Area:     13.00 cm LA Vol (A2C):   64.2 ml 27.90 ml/m RA Volume:   32.30 ml  14.04 ml/m LA Vol (A4C):   51.3 ml 22.29 ml/m LA Biplane Vol: 58.2 ml 25.29 ml/m AORTIC VALVE LVOT Vmax:   91.00 cm/s LVOT Vmean:  66.700 cm/s LVOT VTI:    0.169 m AI PHT:      586 msec  AORTA Ao Root diam: 3.80 cm Ao Asc diam:  4.50 cm  MITRAL VALVE MV Area (PHT): 3.53 cm    SHUNTS MV Decel Time: 215 msec    Systemic VTI:  0.17 m MV E velocity: 96.00 cm/s  Systemic Diam: 2.20 cm MV A velocity: 78.10 cm/s MV E/A ratio:  1.23  Riley Lam MD Electronically signed by Riley Lam MD Signature Date/Time: 12/08/2020/5:22:54 PM    Final   TEE  ECHO TEE 06/16/2015  Narrative *[facility]* *[facility]* [address] [place], [place] [address] [phone]  ------------------------------------------------------------------- Transesophageal Echocardiography  Patient:    Janorris, Sokol MR #:       [id number] Study Date: 06/16/2015 Gender:     M Age:        82 Height:     188 cm Weight:     85.3 kg BSA:        2.11 m^2 Pt. Status: Room:  ADMITTING    Charlton Haws, M.D. PERFORMING   Charlton Haws, M.D. ATTENDING    Dunn, Dayna N ORDERING     Dunn, Dayna N SONOGRAPHER  Delcie Roch, RDCS, CCT  cc:  -------------------------------------------------------------------  ------------------------------------------------------------------- Indications:      Mitral regurgitation  424.0.  ------------------------------------------------------------------- Study Conclusions  - Impressions: Mild LVE EF 45-50% mid and basal inferior wall hypokinesis Moderate ischemic MR: may benefit from annuloplasty repair during CABG Normal AV Normal PV Mild TR No LAA thrombus No ASD/PFO No aortic debris No pericardial effusion  Impressions:  - Mild LVE EF 45-50% mid and basal inferior wall hypokinesis Moderate ischemic MR: may benefit from annuloplasty repair during CABG Normal AV Normal PV Mild TR No LAA thrombus No ASD/PFO No aortic debris No pericardial effusion  Diagnostic transesophageal echocardiography.  2D and color Doppler. Birthdate:  Patient birthdate: [birth year].  Age:  Patient is 62 yr old.  Sex:  Gender: male.    BMI: 24.1 kg/m^2.  Blood pressure: 119/85  Patient status:  Outpatient.  Study date:  Study date: 06/16/2015. Study time: 11:24 AM.  Location:  Endoscopy.  -------------------------------------------------------------------  ------------------------------------------------------------------- Measurements  Mitral valve                              Value Mitral maximal regurg velocity, PISA      482   cm/s Mitral regurg VTI, PISA                   181   cm Mitral ERO, PISA                          0.13  cm^2 Mitral regurg volume, PISA  24    ml  Legend: (L)  and  (H)  mark values outside specified reference range.  ------------------------------------------------------------------- Prepared and Electronically Authenticated by  Charlton Haws, M.D. 2016-08-26T17:08:39            EKG:   EKG Interpretation Date/Time:  Friday August 29 2023 15:14:20 EST Ventricular Rate:  78 PR Interval:  164 QRS Duration:  88 QT Interval:  370 QTC Calculation: 421 R Axis:   -20  Text Interpretation: Normal sinus rhythm Left ventricular hypertrophy Since last tracing ST-T changes have resolved Confirmed by Tonny Bollman  [phone]) on 08/29/2023 3:27:29 PM    Recent Labs: 10/23/2022: TSH 2.12 07/28/2023: ALT 15; BUN 16; Creatinine, Ser 1.47; Hemoglobin 10.1; Platelets 416.0; Potassium 4.3; Sodium 142  Recent Lipid Panel    Component Value Date/Time   CHOL 126 04/30/2023 1536   CHOL 135 12/31/2018 1614   TRIG 98.0 04/30/2023 1536   HDL 36.80 (L) 04/30/2023 1536   HDL 31 (L) 12/31/2018 1614   CHOLHDL 3 04/30/2023 1536   VLDL 19.6 04/30/2023 1536   LDLCALC 70 04/30/2023 1536   LDLCALC 74 04/22/2022 1641     Risk Assessment/Calculations:                Physical Exam:    VS:  BP 120/60   Pulse 78   Ht 6\' 2"  (1.88 m)   Wt 240 lb 6.4 oz (109 kg)   SpO2 96%   BMI 30.87 kg/m     Wt Readings from Last 3 Encounters:  08/29/23 240 lb 6.4 oz (109 kg)  07/28/23 237 lb 8 oz (107.7 kg)  04/30/23 232 lb (105.2 kg)     GEN:  Well nourished, well developed in no acute distress HEENT: Normal NECK: No JVD; No carotid bruits LYMPHATICS: No lymphadenopathy CARDIAC: RRR, no murmurs, rubs, gallops RESPIRATORY:  Clear to auscultation without rales, wheezing or rhonchi  ABDOMEN: Soft, non-tender, non-distended MUSCULOSKELETAL:  No edema; No deformity  SKIN: Warm and dry NEUROLOGIC:  Alert and oriented x 3 PSYCHIATRIC:  Normal affect   Assessment & Plan Pre-op evaluation Patient is at low risk of complication related to EGD and colonoscopy.  He is not having any active anginal or heart failure symptoms.  He can hold clopidogrel 5 to 7 days before the procedure. Coronary artery disease involving native coronary artery of native heart with angina pectoris (HCC) Continue carvedilol and isosorbide for antianginal therapy.  Continue clopidogrel for antiplatelet therapy with ability to hold this for procedures as outlined above. Hyperlipidemia LDL goal <70 Treated with rosuvastatin 40 mg daily.  Cholesterol is 126, LDL 70.  Needs to work on lifestyle modification and dietary changes.  We gave him a handout on  healthy food choices today. Nonrheumatic mitral valve regurgitation Mild mitral regurgitation, not clinically significant.  No murmur audible on his exam today. Essential hypertension Blood pressure is well-controlled on his current medical therapy.  He will continue the same. HFmrEF (heart failure with mildly reduced EF) Stable without congestive symptoms.  LVEF 40 to 45% on most recent echo assessment.  He continues on lisinopril, carvedilol, isosorbide. Ascending aorta dilation (HCC) The ascending aorta has measured 45 mm on previous echo.  Will arrange a CTA of the chest for radiographic follow-up.            Medication Adjustments/Labs and Tests Ordered: Current medicines are reviewed at length with the patient today.  Concerns regarding medicines are outlined above.  Orders Placed This Encounter  Procedures  CT ANGIO CHEST AORTA W/ & OR WO/CM & GATING ([place] ONLY)   Basic metabolic panel   EKG 12-Lead   No orders of the defined types were placed in this encounter.   Patient Instructions  Testing/Procedures: **Cleared for upcoming procedure**  Follow-Up: At [facility], you and your health needs are our priority.  As part of our continuing mission to provide you with exceptional heart care, we have created designated Provider Care Teams.  These Care Teams include your primary Cardiologist (physician) and Advanced Practice Providers (APPs -  Physician Assistants and Nurse Practitioners) who all work together to provide you with the care you need, when you need it.  Your next appointment:   1 year(s)  Provider:   Tonny Bollman, MD       Other Instructions Adopting a Healthy Lifestyle.   Weight: Know what a healthy weight is for you (roughly BMI <25) and aim to maintain this. You can calculate your body mass index on your smart phone. Unfortunately, this is not the most accurate measure of healthy weight, but it is the simplest measurement to use. A more  accurate measurement involves body scanning which measures lean muscle, fat tissue and bony density. We do not have this equipment at [facility].    Diet: Aim for 7+ servings of fruits and vegetables daily Limit animal fats in diet for cholesterol and heart health - choose grass fed whenever available Avoid highly processed foods (fast food burgers, tacos, fried chicken, pizza, hot dogs, french fries)  Saturated fat comes in the form of butter, lard, coconut oil, margarine, partially hydrogenated oils, and fat in meat. These increase your risk of cardiovascular disease.  Use healthy plant oils, such as olive, canola, soy, corn, sunflower and peanut.  Whole foods such as fruits, vegetables and whole grains have fiber  Men need > 38 grams of fiber per day Women need > 25 grams of fiber per day  Load up on vegetables and fruits - one-half of your plate: Aim for color and variety, and remember that potatoes dont count. Go for whole grains - one-quarter of your plate: Whole wheat, barley, wheat berries, quinoa, oats, brown rice, and foods made with them. If you want pasta, go with whole wheat pasta. Protein power - one-quarter of your plate: Fish, chicken, beans, and nuts are all healthy, versatile protein sources. Limit red meat. You need carbohydrates for energy! The type of carbohydrate is more important than the amount. Choose carbohydrates such as vegetables, fruits, whole grains, beans, and nuts in the place of white rice, white pasta, potatoes (baked or fried), macaroni and cheese, cakes, cookies, and donuts.  If youre thirsty, drink water. Coffee and tea are good in moderation, but skip sugary drinks and limit milk and dairy products to one or two daily servings. Keep sugar intake at 6 teaspoons or 24 grams or LESS       Exercise: Aim for 150 min of moderate intensity exercise weekly for heart health, and weights twice weekly for bone health Stay active - any steps are better than no  steps! Aim for 7-9 hours of sleep daily         Signed, Tonny Bollman, MD  08/29/2023 5:15 PM    [facility]

## 2023-08-29 NOTE — Assessment & Plan Note (Signed)
Blood pressure is well-controlled on his current medical therapy.  He will continue the same.

## 2023-09-01 DIAGNOSIS — I2510 Atherosclerotic heart disease of native coronary artery without angina pectoris: Secondary | ICD-10-CM | POA: Diagnosis not present

## 2023-09-01 DIAGNOSIS — I129 Hypertensive chronic kidney disease with stage 1 through stage 4 chronic kidney disease, or unspecified chronic kidney disease: Secondary | ICD-10-CM | POA: Diagnosis not present

## 2023-09-01 DIAGNOSIS — N183 Chronic kidney disease, stage 3 unspecified: Secondary | ICD-10-CM | POA: Diagnosis not present

## 2023-09-01 DIAGNOSIS — R809 Proteinuria, unspecified: Secondary | ICD-10-CM | POA: Diagnosis not present

## 2023-09-01 DIAGNOSIS — E1122 Type 2 diabetes mellitus with diabetic chronic kidney disease: Secondary | ICD-10-CM | POA: Diagnosis not present

## 2023-09-01 DIAGNOSIS — D631 Anemia in chronic kidney disease: Secondary | ICD-10-CM | POA: Diagnosis not present

## 2023-09-01 NOTE — Progress Notes (Signed)
Updated CTA chest order and added comments to do without contrast due to pt's updated kidney functioning.

## 2023-09-01 NOTE — Addendum Note (Signed)
Addended by: Lars Mage on: 09/01/2023 04:37 PM   Modules accepted: Orders

## 2023-09-02 LAB — LAB REPORT - SCANNED
Albumin, Urine POC: 10.5
Albumin/Creatinine Ratio, Urine, POC: 6

## 2023-09-10 ENCOUNTER — Encounter (HOSPITAL_COMMUNITY): Payer: 59

## 2023-09-10 ENCOUNTER — Encounter: Payer: Self-pay | Admitting: Gastroenterology

## 2023-09-11 ENCOUNTER — Telehealth: Payer: Self-pay | Admitting: Nurse Practitioner

## 2023-09-11 NOTE — Telephone Encounter (Signed)
Glendora Score, refer to office visit 07/28/2023, pls contact patient and schedule him for an EGD and colonoscopy at [facility] with Dr. Myrtie Neither. Cardiac clearance and Plavix (Clopidogrel) hold instructions completed by Dr. Tonny Bollman 08/29/2023 as follows:  Patient is at low risk of complication related to EGD and colonoscopy. He is not having any active anginal or heart failure symptoms. He can hold clopidogrel 5 to 7 days before the procedure.   Kaira, pls inform patient to hold his Clopidogrel/Plavix for 5 days prior to his procedure date thx.

## 2023-09-12 NOTE — Telephone Encounter (Signed)
Patient returning call please advise. Thank you.

## 2023-09-12 NOTE — Telephone Encounter (Signed)
Unable to reach patient, VM box full.

## 2023-09-15 ENCOUNTER — Other Ambulatory Visit (HOSPITAL_COMMUNITY): Payer: Self-pay | Admitting: *Deleted

## 2023-09-16 ENCOUNTER — Ambulatory Visit (HOSPITAL_COMMUNITY)
Admission: RE | Admit: 2023-09-16 | Discharge: 2023-09-16 | Disposition: A | Discharge status: Home / Self Care | Payer: 59 | Source: Ambulatory Visit | Attending: Nephrology | Admitting: Nephrology

## 2023-09-16 DIAGNOSIS — D631 Anemia in chronic kidney disease: Secondary | ICD-10-CM | POA: Diagnosis not present

## 2023-09-16 DIAGNOSIS — N189 Chronic kidney disease, unspecified: Secondary | ICD-10-CM | POA: Diagnosis not present

## 2023-09-16 MED ORDER — SODIUM CHLORIDE 0.9 % IV SOLN
510.0000 mg | INTRAVENOUS | Status: DC
  Administered 2023-09-16: 510 mg via INTRAVENOUS
  Filled 2023-09-16: qty 510

## 2023-09-16 NOTE — Telephone Encounter (Signed)
Discussed with patient about scheduling endo colon at [facility] & he is hesitant to schedule says that his nephrologist told him that his IDA was only from his CKD. He also stated that it would be impossible for him to do a morning time d/t his medication routine. Advised him of the recommendations from OV note & that hospital procedures are limited, and would only be done in the morning and that he could take medication as planned it would just need to be taken earlier/later than scheduled depending on timing of procedure. Pt goes back to nephrology advising him to do iron infusion to treat the IDA as CKD is the cause.

## 2023-09-16 NOTE — Telephone Encounter (Signed)
Spoke with patient & he asked that I give him a call back today after 4.

## 2023-09-17 ENCOUNTER — Encounter (HOSPITAL_COMMUNITY): Payer: 59

## 2023-09-20 ENCOUNTER — Other Ambulatory Visit: Payer: Self-pay | Admitting: Internal Medicine

## 2023-09-22 ENCOUNTER — Other Ambulatory Visit: Payer: Self-pay

## 2023-09-22 NOTE — Telephone Encounter (Signed)
Dr. Myrtie Neither, refer to office visit 07/28/2023. CKD a contributing factor regarding his anemia, however, EGD/colonoscopy in the hospital setting previously recommended due to IDA. Note, patient did not return to our lab for folic acid level. Please review patient's message below and I will relay your response to patient. THX.

## 2023-09-23 ENCOUNTER — Encounter (HOSPITAL_COMMUNITY): Payer: 59

## 2023-09-23 NOTE — Telephone Encounter (Signed)
My recommendations remain the same, and you are correct that we only have AM slots available in the hospital procedure blocks.  The choice is his to make.  If he does not feel comfortable proceeding, then we will not schedule.  - H. Myrtie Neither, MD

## 2023-09-25 NOTE — Telephone Encounter (Signed)
Kaira, pls contact patient with Dr. Myrtie Neither' input as noted below. EGD and colonoscopy in the hospital setting remain our recommendation. THX.

## 2023-09-26 NOTE — Telephone Encounter (Signed)
Spoke with patient & he would prefer to call back and schedule a date for the procedure at a later time. He has to arrange for transportation, and is still hesitant given the morning time. He's aware dates/times are limited for the procedures. He will give [place] a call once this has been arranged so we can get date for him.

## 2023-09-30 ENCOUNTER — Other Ambulatory Visit: Payer: Self-pay | Admitting: Cardiovascular Disease

## 2023-09-30 ENCOUNTER — Other Ambulatory Visit: Payer: Self-pay | Admitting: Nurse Practitioner

## 2023-09-30 DIAGNOSIS — I340 Nonrheumatic mitral (valve) insufficiency: Secondary | ICD-10-CM

## 2023-09-30 DIAGNOSIS — I25118 Atherosclerotic heart disease of native coronary artery with other forms of angina pectoris: Secondary | ICD-10-CM

## 2023-09-30 DIAGNOSIS — I10 Essential (primary) hypertension: Secondary | ICD-10-CM

## 2023-09-30 DIAGNOSIS — Z91199 Patient's noncompliance with other medical treatment and regimen due to unspecified reason: Secondary | ICD-10-CM

## 2023-09-30 DIAGNOSIS — I214 Non-ST elevation (NSTEMI) myocardial infarction: Secondary | ICD-10-CM

## 2023-09-30 DIAGNOSIS — I2510 Atherosclerotic heart disease of native coronary artery without angina pectoris: Secondary | ICD-10-CM

## 2023-09-30 DIAGNOSIS — I5020 Unspecified systolic (congestive) heart failure: Secondary | ICD-10-CM

## 2023-10-02 ENCOUNTER — Encounter (HOSPITAL_COMMUNITY)
Admission: RE | Admit: 2023-10-02 | Discharge: 2023-10-02 | Disposition: A | Discharge status: Home / Self Care | Payer: 59 | Source: Ambulatory Visit | Attending: Nephrology | Admitting: Nephrology

## 2023-10-02 DIAGNOSIS — N189 Chronic kidney disease, unspecified: Secondary | ICD-10-CM | POA: Insufficient documentation

## 2023-10-02 DIAGNOSIS — D631 Anemia in chronic kidney disease: Secondary | ICD-10-CM | POA: Insufficient documentation

## 2023-10-02 MED ORDER — SODIUM CHLORIDE 0.9 % IV SOLN
510.0000 mg | INTRAVENOUS | Status: AC
  Administered 2023-10-02: 510 mg via INTRAVENOUS
  Filled 2023-10-02: qty 17
  Filled 2023-10-02: qty 510
  Filled 2023-10-02: qty 17

## 2023-10-31 ENCOUNTER — Ambulatory Visit: Payer: 59 | Admitting: Internal Medicine

## 2023-11-03 ENCOUNTER — Other Ambulatory Visit: Payer: Self-pay | Admitting: Internal Medicine

## 2023-11-03 ENCOUNTER — Other Ambulatory Visit: Payer: Self-pay | Admitting: Cardiovascular Disease

## 2023-11-10 ENCOUNTER — Ambulatory Visit (HOSPITAL_COMMUNITY): Payer: 59

## 2023-11-11 ENCOUNTER — Ambulatory Visit (HOSPITAL_COMMUNITY): Payer: 59

## 2023-11-20 ENCOUNTER — Ambulatory Visit: Payer: 59 | Admitting: Internal Medicine

## 2023-11-26 ENCOUNTER — Ambulatory Visit: Payer: Self-pay | Admitting: Internal Medicine

## 2023-11-26 ENCOUNTER — Telehealth: Payer: Self-pay | Admitting: Internal Medicine

## 2023-11-26 NOTE — Telephone Encounter (Signed)
 Hello - this should be ok for now and continue to monitor, as there is no specific other evaluation or treatment needed in this case.   Consider ROV if feeling worse.   thanks

## 2023-11-26 NOTE — Telephone Encounter (Signed)
 Copied from CRM [id number]. Topic: Clinical - Prescription Issue >> Nov 25, 2023  4:50 PM Corin V wrote: Reason for CRM: Pharmacy called to let Dr. Norleen know that there was an error with the labeling of his medicaiton bottles and the ezetimibe  (ZETIA ) 10 MG tablet bottle had a  rosuvastatin  (CRESTOR ) 40 MG tablet bottle label on it, so he was taking 2 doses of the Zetia  and no Crestor  for 2-3 days. They have fixed the labels but patient may call for a sick visit as he told the pharmacist he was feeling funny

## 2023-11-26 NOTE — Telephone Encounter (Signed)
 Chief Complaint: Dizziness  Symptoms: dizziness, increased urinary frequency Frequency: Unable to quantify, states since they messed my meds up in December Pertinent Negatives: Patient denies fever, CP, diarrhea Disposition: [x] ED /[] Urgent Care (no appt availability in office) / [] Appointment(In office/virtual)/ []  [facility] Virtual Care/ [] Home Care/ [x] Refused Recommended Disposition /[] [facility]/ []  Follow-up with PCP Additional Notes: Patient calls stating that the pharmacy mixed up his medications and put his rosuvastatin  in both the correct bottle and bottle labeled ezetimibe . He reports he has been taking a double dose of this medication since December. Verbalizes that he is experiencing dizziness due to the double dose, but cannot identify when it began. States probably since they messed up my medication. During call patient advised this RN he was pulling into a facility and placing me on hold to use the restroom. Patient stated during call that he had consulted with PharmD face to face where the medication was dispensed from. Patient states poison control was not contacted. This RN advised patient that symptomatic unintentional overdose requires immediate intervention through 911 and poison control. Patient declined stating, You're making me angry, I will just consult my attorney and disconnected call stating have a blessed day. This RN contacted [facility], spoke with Rexene, advised patient refused dispo. Alerting PCP for review.   Copied from CRM [id number]. Topic: Clinical - Red Word Triage >> Nov 26, 2023  1:07 PM [name] wrote: Red Word that prompted transfer to Nurse Triage: feeling funny from taking wrong medication,irregular urination,headache,equilibrium off Reason for Disposition  Overdose (accidental or intentional) of medications  [1] DOUBLE DOSE (an extra dose or lesser amount) of prescription drug AND [2] any symptoms (e.g., dizziness, nausea, pain,  sleepiness)  Answer Assessment - Initial Assessment Questions 1. DESCRIPTION: Describe your dizziness.     When bending over and standing straight up, feels like he will fall. Or walking feels unsteady. 2. LIGHTHEADED: Do you feel lightheaded? (e.g., somewhat faint, woozy, weak upon standing)     Yes 3. VERTIGO: Do you feel like either you or the room is spinning or tilting? (i.e. vertigo)     When standing, yes. 4. SEVERITY: How bad is it?  Do you feel like you are going to faint? Can you stand and walk?   - MILD: Feels slightly dizzy, but walking normally.   - MODERATE: Feels unsteady when walking, but not falling; interferes with normal activities (e.g., school, work).   - SEVERE: Unable to walk without falling, or requires assistance to walk without falling; feels like passing out now.      Mild 5. ONSET:  When did the dizziness begin?     Cannot identify- states it's just been creeping up since December. 6. AGGRAVATING FACTORS: Does anything make it worse? (e.g., standing, change in head position)     Changing positions makes it worse. 7. HEART RATE: Can you tell me your heart rate? How many beats in 15 seconds?  (Note: not all patients can do this)       Unable to quantify 8. CAUSE: What do you think is causing the dizziness?     Medication mix up- see note. 9. RECURRENT SYMPTOM: Have you had dizziness before? If Yes, ask: When was the last time? What happened that time?     Denies 10. OTHER SYMPTOMS: Do you have any other symptoms? (e.g., fever, chest pain, vomiting, diarrhea, bleeding)       Increased urinary frequency  Answer Assessment - Initial Assessment Questions 1. SUBSTANCE: What  was swallowed? If necessary, have the caller look at the product or drug label on the container to determine active ingredients.     Patient reports he has been taking a double dose of rosuvastatin  since December, states pharmacy put the wrong medication in his  bottles.  2. AMOUNT: How much was swallowed? (e.g., what was the possible maximum amount)      Reports 2 pills daily since December- unable to specify which medication but states he believes it was Rosuvastatin . 3. ONSET: When was it probably swallowed? (Minutes or hours ago)      Daily since December 4. SYMPTOMS: Do you have any symptoms? If Yes, ask: What are they? (e.g., abdomen pain, vomiting, weakness)      Dizziness, increased urinary frequency 5. TREATMENT: Have you done anything to treat this? If Yes, ask: What did you do?     Stopped taking the medication- has not taken since yesterday 6. SUICIDAL: Did you take this to hurt or kill yourself?     Denies, states it was a mix up at the pharmacy  Protocols used: Dizziness - Lightheadedness-A-AH, Poisoning-A-AH

## 2023-11-27 ENCOUNTER — Ambulatory Visit (HOSPITAL_COMMUNITY): Payer: 59

## 2023-11-28 ENCOUNTER — Ambulatory Visit
Admission: EM | Admit: 2023-11-28 | Discharge: 2023-11-28 | Disposition: A | Discharge status: Home / Self Care | Payer: 59

## 2023-11-28 ENCOUNTER — Other Ambulatory Visit: Payer: Self-pay

## 2023-11-28 ENCOUNTER — Emergency Department (HOSPITAL_BASED_OUTPATIENT_CLINIC_OR_DEPARTMENT_OTHER)
Admission: EM | Admit: 2023-11-28 | Discharge: 2023-11-29 | Discharge status: Against Medical Advice | Payer: 59 | Attending: Emergency Medicine | Admitting: Emergency Medicine

## 2023-11-28 ENCOUNTER — Encounter: Payer: Self-pay | Admitting: Emergency Medicine

## 2023-11-28 DIAGNOSIS — N179 Acute kidney failure, unspecified: Secondary | ICD-10-CM | POA: Insufficient documentation

## 2023-11-28 DIAGNOSIS — R059 Cough, unspecified: Secondary | ICD-10-CM | POA: Diagnosis present

## 2023-11-28 DIAGNOSIS — Z79899 Other long term (current) drug therapy: Secondary | ICD-10-CM | POA: Diagnosis not present

## 2023-11-28 DIAGNOSIS — R739 Hyperglycemia, unspecified: Secondary | ICD-10-CM

## 2023-11-28 DIAGNOSIS — I2510 Atherosclerotic heart disease of native coronary artery without angina pectoris: Secondary | ICD-10-CM | POA: Insufficient documentation

## 2023-11-28 DIAGNOSIS — R42 Dizziness and giddiness: Secondary | ICD-10-CM

## 2023-11-28 DIAGNOSIS — I10 Essential (primary) hypertension: Secondary | ICD-10-CM | POA: Diagnosis not present

## 2023-11-28 DIAGNOSIS — E1165 Type 2 diabetes mellitus with hyperglycemia: Secondary | ICD-10-CM | POA: Diagnosis not present

## 2023-11-28 DIAGNOSIS — U071 COVID-19: Secondary | ICD-10-CM | POA: Diagnosis not present

## 2023-11-28 DIAGNOSIS — I951 Orthostatic hypotension: Secondary | ICD-10-CM

## 2023-11-28 DIAGNOSIS — E1100 Type 2 diabetes mellitus with hyperosmolarity without nonketotic hyperglycemic-hyperosmolar coma (NKHHC): Secondary | ICD-10-CM | POA: Diagnosis present

## 2023-11-28 DIAGNOSIS — R000 Tachycardia, unspecified: Secondary | ICD-10-CM | POA: Diagnosis not present

## 2023-11-28 LAB — CBC WITH DIFFERENTIAL/PLATELET
Abs Immature Granulocytes: 0.01 10*3/uL (ref 0.00–0.07)
Basophils Absolute: 0 10*3/uL (ref 0.0–0.1)
Basophils Relative: 0 %
Eosinophils Absolute: 0.1 10*3/uL (ref 0.0–0.5)
Eosinophils Relative: 1 %
HCT: 33.8 % — ABNORMAL LOW (ref 39.0–52.0)
Hemoglobin: 11.3 g/dL — ABNORMAL LOW (ref 13.0–17.0)
Immature Granulocytes: 0 %
Lymphocytes Relative: 28 %
Lymphs Abs: 1.5 10*3/uL (ref 0.7–4.0)
MCH: 29.1 pg (ref 26.0–34.0)
MCHC: 33.4 g/dL (ref 30.0–36.0)
MCV: 87.1 fL (ref 80.0–100.0)
Monocytes Absolute: 0.5 10*3/uL (ref 0.1–1.0)
Monocytes Relative: 9 %
Neutro Abs: 3.2 10*3/uL (ref 1.7–7.7)
Neutrophils Relative %: 62 %
Platelets: 326 10*3/uL (ref 150–400)
RBC: 3.88 MIL/uL — ABNORMAL LOW (ref 4.22–5.81)
RDW: 14.8 % (ref 11.5–15.5)
WBC: 5.2 10*3/uL (ref 4.0–10.5)
nRBC: 0 % (ref 0.0–0.2)

## 2023-11-28 LAB — RESP PANEL BY RT-PCR (RSV, FLU A&B, COVID)  RVPGX2
Influenza A by PCR: NEGATIVE
Influenza B by PCR: NEGATIVE
Resp Syncytial Virus by PCR: NEGATIVE
SARS Coronavirus 2 by RT PCR: POSITIVE — AB

## 2023-11-28 LAB — URINALYSIS, ROUTINE W REFLEX MICROSCOPIC
Bacteria, UA: NONE SEEN
Bilirubin Urine: NEGATIVE
Glucose, UA: >1000 mg/dL — AB
Hgb urine dipstick: NEGATIVE
Ketones, ur: 15 mg/dL — AB
Leukocytes,Ua: NEGATIVE
Nitrite: NEGATIVE
Protein, ur: NEGATIVE mg/dL
Specific Gravity, Urine: 1.028 (ref 1.005–1.030)
pH: 5.5 (ref 5.0–8.0)

## 2023-11-28 LAB — BASIC METABOLIC PANEL
Anion gap: 16 — ABNORMAL HIGH (ref 5–15)
BUN: 22 mg/dL (ref 8–23)
CO2: 21 mmol/L — ABNORMAL LOW (ref 22–32)
Calcium: 9.3 mg/dL (ref 8.9–10.3)
Chloride: 91 mmol/L — ABNORMAL LOW (ref 98–111)
Creatinine, Ser: 1.86 mg/dL — ABNORMAL HIGH (ref 0.61–1.24)
GFR, Estimated: 41 mL/min — ABNORMAL LOW (ref >60)
Glucose, Bld: 787 mg/dL (ref 70–99)
Potassium: 5 mmol/L (ref 3.5–5.1)
Sodium: 128 mmol/L — ABNORMAL LOW (ref 135–145)

## 2023-11-28 LAB — I-STAT VENOUS BLOOD GAS, ED
Acid-base deficit: 4 mmol/L — ABNORMAL HIGH (ref 0.0–2.0)
Bicarbonate: 23.4 mmol/L (ref 20.0–28.0)
Calcium, Ion: 1.17 mmol/L (ref 1.15–1.40)
HCT: 36 % — ABNORMAL LOW (ref 39.0–52.0)
Hemoglobin: 12.2 g/dL — ABNORMAL LOW (ref 13.0–17.0)
O2 Saturation: 38 %
Patient temperature: 97.6
Potassium: 5.8 mmol/L — ABNORMAL HIGH (ref 3.5–5.1)
Sodium: 127 mmol/L — ABNORMAL LOW (ref 135–145)
TCO2: 25 mmol/L (ref 22–32)
pCO2, Ven: 47.7 mm[Hg] (ref 44–60)
pH, Ven: 7.296 (ref 7.25–7.43)
pO2, Ven: 24 mm[Hg] — CL (ref 32–45)

## 2023-11-28 LAB — CBG MONITORING, ED
Glucose-Capillary: >600 mg/dL (ref 70–99)
Glucose-Capillary: 449 mg/dL — ABNORMAL HIGH (ref 70–99)
Glucose-Capillary: 549 mg/dL (ref 70–99)

## 2023-11-28 MED ORDER — INSULIN REGULAR HUMAN 100 UNIT/ML IJ SOLN
10.0000 [IU] | Freq: Once | INTRAMUSCULAR | Status: DC
  Filled 2023-11-28: qty 3

## 2023-11-28 MED ORDER — INSULIN REGULAR(HUMAN) IN NACL 100-0.9 UT/100ML-% IV SOLN
INTRAVENOUS | Status: DC
  Administered 2023-11-28: 13 [IU]/h via INTRAVENOUS
  Filled 2023-11-28: qty 100

## 2023-11-28 MED ORDER — ONDANSETRON HCL 4 MG/2ML IJ SOLN
4.0000 mg | Freq: Once | INTRAMUSCULAR | Status: AC
  Administered 2023-11-28: 4 mg via INTRAVENOUS
  Filled 2023-11-28: qty 2

## 2023-11-28 MED ORDER — DEXTROSE 50 % IV SOLN: 0.0000 mL | INTRAVENOUS | Status: DC | PRN

## 2023-11-28 MED ORDER — LACTATED RINGERS IV SOLN: INTRAVENOUS | Status: DC

## 2023-11-28 MED ORDER — DEXTROSE IN LACTATED RINGERS 5 % IV SOLN
INTRAVENOUS | Status: DC
  Administered 2023-11-29: 1000 mL via INTRAVENOUS

## 2023-11-28 MED ORDER — MORPHINE SULFATE (PF) 4 MG/ML IV SOLN
4.0000 mg | Freq: Once | INTRAVENOUS | Status: AC
Start: 2023-11-28 — End: 2023-11-28
  Administered 2023-11-28: 4 mg via INTRAVENOUS
  Filled 2023-11-28: qty 1

## 2023-11-28 MED ORDER — LACTATED RINGERS IV BOLUS
1000.0000 mL | Freq: Once | INTRAVENOUS | Status: AC
  Administered 2023-11-28: 1000 mL via INTRAVENOUS

## 2023-11-28 NOTE — Discharge Instructions (Signed)
 Please go to the ER right now, though  as I mentioned, it is not safe for you to drive the way your blood pressure is so low

## 2023-11-28 NOTE — ED Notes (Signed)
 Pt ambulated to lobby without need for assist

## 2023-11-28 NOTE — ED Notes (Signed)
 Pt has been advised he needs to head to ER to be seen due to hypotension. Pt refused ambulance transport and stated he wanted to drive himself. Pt has signed an AMA.
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
## 2023-11-28 NOTE — Progress Notes (Signed)
 Plan of Care Note for accepted transfer   Patient: [name] MRN: [id number]   DOA: 11/28/2023  Facility requesting transfer: [facility]   Requesting Provider: Dr. Doretha   Reason for transfer: HHS   Facility course: 63 yr old man with HTN, HLD, T2DM, CAD, HFmrEF, and seizures who stopped metformin  months ago due to diarrhea and now presents with multiple complaints including lightheadedness on standing, nausea, headache, and URI symptoms.  He was initially seen at urgent care but SBP dropped to the 60s upon standing and he was directed to the ED.  Vitals have been stable in the ED but he is found to be positive for COVID-19 and noted to have serum glucose of 787 with bicarbonate 21, anion gap 16, and normal pH on blood gas.  He was given a liter of LR, morphine , Zofran , and started on IV insulin  infusion.  Plan of care: The patient is accepted for admission to [facility] unit, at [facility].   Author: Evalene GORMAN Sprinkles, MD 11/28/2023  Check [email or web address] for on-call coverage.  Nursing staff, Please call [facility] Admits & Consults System-Wide number on Amion as soon as patient's arrival, so appropriate admitting provider can evaluate the pt.

## 2023-11-28 NOTE — ED Provider Notes (Signed)
 [place] EMERGENCY DEPARTMENT AT [facility] Provider Note   CSN: [id number] Arrival date & time: 11/28/23  1856     History  Chief Complaint  Patient presents with   Cough   Dizziness    [name] is a 63 y.o. male.  Patient is a 63 year old male with a history of CAD status post stents, ischemic cardiomyopathy with an EF of 40 to 45%, seizure disorder, hypertension, hyperlipidemia who is presenting today from urgent care due to complaint of dizziness.  Patient reports he initially went to urgent care because he found out when he went to the pharmacy today that he was taking double of the medication that was mislabeled.  However when he got to urgent care he also complained of having headache, URI type symptoms for the last 1 week.  He has not had fever and denies any shortness of breath or chest pain.  He reports his abdomen feels distended at times but denies any nausea or vomiting.  He has not noticed any swelling in his legs.  He reports at 1 point he was on metformin  but his doctor told him he did not need to take it anymore because it was causing diarrhea.  He does not take any diabetic medications.  He does complain of polyuria and polydipsia.  The history is provided by the patient and medical records.  Cough Dizziness      Home Medications Prior to Admission medications   Medication Sig Start Date End Date Taking? Authorizing Provider  albuterol  (VENTOLIN  HFA) 108 (90 Base) MCG/ACT inhaler INHALE 2 PUFFS INTO THE LUNGS EVERY 6 HOURS AS NEEDED FOR WHEEZING OR SHORTNESS OF BREATH 09/22/23   Norleen Lynwood ORN, MD  carvedilol  (COREG ) 3.125 MG tablet TAKE 1 TABLET(3.125 MG) BY MOUTH TWICE DAILY WITH A MEAL 10/01/23   Wonda Sharper, MD  Cholecalciferol (VITAMIN D3) 50 MCG (2000 UT) capsule Take 1 capsule (2,000 Units total) by mouth daily. 04/11/21   Norleen Lynwood ORN, MD  clopidogrel  (PLAVIX ) 75 MG tablet TAKE 1 TABLET(75 MG) BY MOUTH DAILY 10/01/23   Swinyer, Rosaline HERO,  NP  ezetimibe  (ZETIA ) 10 MG tablet TAKE 1 TABLET(10 MG) BY MOUTH DAILY 10/01/23   Swinyer, Rosaline HERO, NP  FERREX 150 150 MG capsule TAKE 1 CAPSULE(150 MG) BY MOUTH DAILY 11/03/23   Norleen Lynwood ORN, MD  furosemide  (LASIX ) 40 MG tablet TAKE 1 TABLET BY MOUTH EVERY DAY AS NEEDED FOR EDEMA(TAKE FOR WEIGHT GAIN OVER 3 POUNDS IN 1 DAY OR LEG SWELLING) 04/11/21   Norleen Lynwood ORN, MD  isosorbide  mononitrate (IMDUR ) 30 MG 24 hr tablet TAKE 1/2 TABLET BY MOUTH DAILY 10/01/23   Wonda Sharper, MD  levETIRAcetam  (KEPPRA ) 250 MG tablet Take 1 tablet (250 mg total) by mouth daily. 1 tablet daily x 1 month and stop 03/05/23   Rosemarie Eather RAMAN, MD  lisinopril  (ZESTRIL ) 2.5 MG tablet TAKE 1 TABLET(2.5 MG) BY MOUTH DAILY 10/01/23   Wonda Sharper, MD  Multiple Vitamin (ONE-A-DAY MENS PO) Take 1 tablet by mouth daily.    [provider]  Niacin (VITAMIN B-3 PO) Take 1 tablet by mouth daily.    [provider]  nitroGLYCERIN  (NITROSTAT ) 0.4 MG SL tablet PLACE 1 TABLET UNDER TONGUE EVERY 5 MINUTES AS NEEDED FOR CHEST PAIN. IF NO RELIEF AFTER 3 DOSES, CALL 911 11/03/23   Wonda Sharper, MD  PHENobarbital  (LUMINAL) 32.4 MG tablet Take 1 tablet (32.4 mg total) by mouth 4 (four) times daily. 06/30/23   Dohmeier,  Dedra, MD  rosuvastatin  (CRESTOR ) 40 MG tablet TAKE 1 TABLET BY MOUTH EVERY DAY 10/01/23   Swinyer, Rosaline HERO, NP  tamsulosin (FLOMAX) 0.4 MG CAPS capsule Take 0.4 mg by mouth daily. 05/26/23   [provider]  TURMERIC PO Take 1 capsule by mouth daily.    [provider]  vitamin B-12 (CYANOCOBALAMIN ) 1000 MCG tablet Take 1,000 mcg by mouth daily.    [provider]  zinc gluconate 50 MG tablet Take 50 mg by mouth daily.    [provider]      Allergies    Patient has no active allergies.    Review of Systems   Review of Systems  Respiratory:  Positive for cough.   Neurological:  Positive for dizziness.    Physical Exam Updated Vital Signs BP 124/74    Pulse 76   Temp 97.6 F (36.4 C)   Resp 14   Ht 6' 2 (1.88 m)   Wt 103 kg   SpO2 97%   BMI 29.15 kg/m  Physical Exam Vitals and nursing note reviewed.  Constitutional:      General: He is not in acute distress.    Appearance: He is well-developed.  HENT:     Head: Normocephalic and atraumatic.     Mouth/Throat:     Mouth: Mucous membranes are dry.  Eyes:     Conjunctiva/sclera: Conjunctivae normal.     Pupils: Pupils are equal, round, and reactive to light.  Cardiovascular:     Rate and Rhythm: Normal rate and regular rhythm.     Heart sounds: No murmur heard. Pulmonary:     Effort: Pulmonary effort is normal. No respiratory distress.     Breath sounds: Normal breath sounds. No wheezing or rales.  Abdominal:     General: There is no distension.     Palpations: Abdomen is soft.     Tenderness: There is no abdominal tenderness. There is no guarding or rebound.  Musculoskeletal:        General: No tenderness. Normal range of motion.     Cervical back: Normal range of motion and neck supple.     Right lower leg: No edema.     Left lower leg: No edema.  Skin:    General: Skin is warm and dry.     Findings: No erythema or rash.  Neurological:     Mental Status: He is alert and oriented to person, place, and time.  Psychiatric:        Behavior: Behavior normal.     ED Results / Procedures / Treatments   Labs (all labs ordered are listed, but only abnormal results are displayed) Labs Reviewed  RESP PANEL BY RT-PCR (RSV, FLU A&B, COVID)  RVPGX2 - Abnormal; Notable for the following components:      Result Value   SARS Coronavirus 2 by RT PCR POSITIVE (*)    All other components within normal limits  CBC WITH DIFFERENTIAL/PLATELET - Abnormal; Notable for the following components:   RBC 3.88 (*)    Hemoglobin 11.3 (*)    HCT 33.8 (*)    All other components within normal limits  BASIC METABOLIC PANEL - Abnormal; Notable for the following components:   Sodium 128 (*)     Chloride 91 (*)    CO2 21 (*)    Glucose, Bld 787 (*)    Creatinine, Ser 1.86 (*)    GFR, Estimated 41 (*)    Anion gap 16 (*)  All other components within normal limits  I-STAT VENOUS BLOOD GAS, ED - Abnormal; Notable for the following components:   pO2, Ven 24 (*)    Acid-base deficit 4.0 (*)    Sodium 127 (*)    Potassium 5.8 (*)    HCT 36.0 (*)    Hemoglobin 12.2 (*)    All other components within normal limits  CBG MONITORING, ED - Abnormal; Notable for the following components:   Glucose-Capillary >600 (*)    All other components within normal limits  URINALYSIS, ROUTINE W REFLEX MICROSCOPIC  HEMOGLOBIN A1C    EKG EKG Interpretation Date/Time:  Friday November 28 2023 19:20:23 EST Ventricular Rate:  101 PR Interval:  148 QRS Duration:  116 QT Interval:  350 QTC Calculation: 453 R Axis:   70  Text Interpretation: Sinus tachycardia Possible Left atrial enlargement Junctional ST depression, probably normal When compared with ECG of 29-Aug-2023 15:14, Questionable change in QRS duration Confirmed by Doretha Folks ([id number]) on 11/28/2023 8:28:35 PM  Radiology No results found.  Procedures Procedures    Medications Ordered in ED Medications  insulin  regular (NOVOLIN R) 100 units/mL injection 10 Units (10 Units Intravenous Not Given 11/28/23 2244)  lactated ringers  infusion (has no administration in time range)  insulin  regular, human (MYXREDLIN ) 100 units/ 100 mL infusion (13 Units/hr Intravenous New Bag/Given 11/28/23 2207)  lactated ringers  infusion (has no administration in time range)  dextrose  5 % in lactated ringers  infusion (has no administration in time range)  dextrose  50 % solution 0-50 mL (has no administration in time range)  morphine  (PF) 4 MG/ML injection 4 mg (has no administration in time range)  ondansetron  (ZOFRAN ) injection 4 mg (has no administration in time range)  lactated ringers  bolus 1,000 mL (1,000 mLs Intravenous New Bag/Given 11/28/23  2147)    ED Course/ Medical Decision Making/ A&P                                 Medical Decision Making Amount and/or Complexity of Data Reviewed Labs: ordered. Decision-making details documented in ED Course. ECG/medicine tests: ordered and independent interpretation performed. Decision-making details documented in ED Course.  Risk OTC drugs. Prescription drug management. Decision regarding hospitalization.   Pt with multiple medical problems and comorbidities and presenting today with a complaint that caries a high risk for morbidity and mortality.  Here today with multiple complaints.  Patient is having symptoms consistent with hyperglycemia with polyuria, polydipsia but is not having classic symptoms of HHS or DKA.  Patient also complaining of URI symptoms but no specific symptoms concerning for pneumonia and breath sounds are clear bilaterally.  Sats are 100% on room air and patient is speaking in full sentences.  He has no evidence of strokelike symptoms and is mentating normally.  I independently interpreted patient's labs and EKG.  Viral panel today is positive for COVID, CBC without acute findings, BMP with mild AKI with creatinine of 1.86 from his baseline of 1.5 and a blood sugar of 787 with an anion gap of 16.  Also patient has hyponatremia.  Patient is EKG today did show some sinus tachycardia but no other acute changes.  Patient given IV fluids and insulin  gtt.  Normal VBG without signs of DKA.  Hb A1C pending.  Given the extent of patient's hyperglycemia, he was started on insulin  drip and given fluids.  Feel that patient would benefit from admission and then will need to  start on a regime of blood sugar medications and will need diabetic education.  Discussed this with the patient.  Hospitalist consulted for admission.  CRITICAL CARE Performed by: Pascal Stiggers Total critical care time: 30 minutes Critical care time was exclusive of separately billable procedures and  treating other patients. Critical care was necessary to treat or prevent imminent or life-threatening deterioration. Critical care was time spent personally by me on the following activities: development of treatment plan with patient and/or surrogate as well as nursing, discussions with consultants, evaluation of patient's response to treatment, examination of patient, obtaining history from patient or surrogate, ordering and performing treatments and interventions, ordering and review of laboratory studies, ordering and review of radiographic studies, pulse oximetry and re-evaluation of patient's condition.         Final Clinical Impression(s) / ED Diagnoses Final diagnoses:  COVID  Hyperglycemia  AKI (acute kidney injury) [facility])    Rx / DC Orders ED Discharge Orders     None         Doretha Folks, MD 11/28/23 2249

## 2023-11-28 NOTE — ED Triage Notes (Signed)
 Pt states he was given the wrong med at [facility] in December. He should have received Rosuvastatin  but instead received Ezetimibe . Pt states this is what is making him dizzy. The overdose on Ezetimibe .

## 2023-11-28 NOTE — ED Triage Notes (Signed)
 Pt POV from [facility], sent to ED for orthostatic hypotension. Reports dizziness upon standing, states his meds were mislabeled, was labeled as rosuvastatin  and was taking zetia  instead, worried about other meds.

## 2023-11-29 DIAGNOSIS — U071 COVID-19: Secondary | ICD-10-CM | POA: Diagnosis not present

## 2023-11-29 LAB — BASIC METABOLIC PANEL
Anion gap: 7 (ref 5–15)
BUN: 20 mg/dL (ref 8–23)
CO2: 24 mmol/L (ref 22–32)
Calcium: 7.3 mg/dL — ABNORMAL LOW (ref 8.9–10.3)
Chloride: 105 mmol/L (ref 98–111)
Creatinine, Ser: 1.34 mg/dL — ABNORMAL HIGH (ref 0.61–1.24)
GFR, Estimated: >60 mL/min (ref >60)
Glucose, Bld: 213 mg/dL — ABNORMAL HIGH (ref 70–99)
Potassium: 3.1 mmol/L — ABNORMAL LOW (ref 3.5–5.1)
Sodium: 136 mmol/L (ref 135–145)

## 2023-11-29 LAB — CBG MONITORING, ED
Glucose-Capillary: 180 mg/dL — ABNORMAL HIGH (ref 70–99)
Glucose-Capillary: 183 mg/dL — ABNORMAL HIGH (ref 70–99)
Glucose-Capillary: 212 mg/dL — ABNORMAL HIGH (ref 70–99)
Glucose-Capillary: 224 mg/dL — ABNORMAL HIGH (ref 70–99)
Glucose-Capillary: 248 mg/dL — ABNORMAL HIGH (ref 70–99)
Glucose-Capillary: 250 mg/dL — ABNORMAL HIGH (ref 70–99)
Glucose-Capillary: 317 mg/dL — ABNORMAL HIGH (ref 70–99)
Glucose-Capillary: 376 mg/dL — ABNORMAL HIGH (ref 70–99)

## 2023-11-29 MED ORDER — INSULIN GLARGINE-YFGN 100 UNIT/ML ~~LOC~~ SOLN
20.0000 [IU] | SUBCUTANEOUS | Status: DC
  Administered 2023-11-29: 20 [IU] via SUBCUTANEOUS
  Filled 2023-11-29: qty 200

## 2023-11-29 MED ORDER — INSULIN ASPART 100 UNIT/ML IJ SOLN
0.0000 [IU] | Freq: Three times a day (TID) | INTRAMUSCULAR | Status: DC
  Administered 2023-11-29: 2 [IU] via SUBCUTANEOUS

## 2023-11-29 MED ORDER — INSULIN ASPART 100 UNIT/ML IJ SOLN: 0.0000 [IU] | Freq: Every day | INTRAMUSCULAR | Status: DC

## 2023-11-29 MED ORDER — POTASSIUM CHLORIDE 10 MEQ/100ML IV SOLN
10.0000 meq | Freq: Once | INTRAVENOUS | Status: AC
  Administered 2023-11-29: 10 meq via INTRAVENOUS
  Filled 2023-11-29: qty 100

## 2023-11-29 NOTE — ED Notes (Signed)
 Pt left ama

## 2023-11-29 NOTE — Discharge Instructions (Signed)
 Follow-up with your primary care doctor as soon as you can.  Our concern is that you have new diabetes and need further management for this.  Please return if your symptoms worsen or if you change your mind about getting further care.

## 2023-11-29 NOTE — ED Notes (Signed)
 Pt states he wants to go home. Discussed with him reason for admission (new diabetes, med needs, education). Pt states he does not have diabetes and will not listen to RN. Dr. Colonel Dears informed

## 2023-11-29 NOTE — ED Provider Notes (Signed)
 Patient did not want to stay for any further.  I believe given concern for new diabetes that he needs to be admitted for further education and optimization and outpatient plan.  He is currently not on any diabetic medicine.  He is positive for COVID as well.  Overall he has capacity make decision to leave AGAINST MEDICAL ADVICE.  We discussed risks and benefits including worsening symptoms, disability, death.  He prefers to follow-up with his primary care doctor outpatient.  Overall patient discharged.  He did not tolerate metformin  in the past and do not feel comfortable prescribing him any medications at this time.  Patient left AMA.  This chart was dictated using voice recognition software.  Despite best efforts to proofread,  errors can occur which can change the documentation meaning.    Ruthe Cornet, DO 11/29/23 [phone]

## 2023-12-01 LAB — HEMOGLOBIN A1C
Hgb A1c MFr Bld: >15.5 % — ABNORMAL HIGH (ref 4.8–5.6)
Mean Plasma Glucose: >398 mg/dL

## 2023-12-04 ENCOUNTER — Ambulatory Visit: Payer: 59 | Admitting: Internal Medicine

## 2023-12-12 ENCOUNTER — Encounter: Payer: Self-pay | Admitting: Internal Medicine

## 2023-12-12 ENCOUNTER — Other Ambulatory Visit: Payer: 59

## 2023-12-12 ENCOUNTER — Ambulatory Visit (INDEPENDENT_AMBULATORY_CARE_PROVIDER_SITE_OTHER): Payer: 59 | Admitting: Internal Medicine

## 2023-12-12 VITALS — BP 142/78 | HR 120 | Temp 98.8°F | Ht 74.0 in | Wt 203.0 lb

## 2023-12-12 DIAGNOSIS — Z125 Encounter for screening for malignant neoplasm of prostate: Secondary | ICD-10-CM | POA: Diagnosis not present

## 2023-12-12 DIAGNOSIS — Z0001 Encounter for general adult medical examination with abnormal findings: Secondary | ICD-10-CM

## 2023-12-12 DIAGNOSIS — E538 Deficiency of other specified B group vitamins: Secondary | ICD-10-CM | POA: Diagnosis not present

## 2023-12-12 DIAGNOSIS — E7800 Pure hypercholesterolemia, unspecified: Secondary | ICD-10-CM

## 2023-12-12 DIAGNOSIS — Z91199 Patient's noncompliance with other medical treatment and regimen due to unspecified reason: Secondary | ICD-10-CM | POA: Diagnosis not present

## 2023-12-12 DIAGNOSIS — I214 Non-ST elevation (NSTEMI) myocardial infarction: Secondary | ICD-10-CM

## 2023-12-12 DIAGNOSIS — N1831 Chronic kidney disease, stage 3a: Secondary | ICD-10-CM | POA: Diagnosis not present

## 2023-12-12 DIAGNOSIS — E1165 Type 2 diabetes mellitus with hyperglycemia: Secondary | ICD-10-CM

## 2023-12-12 DIAGNOSIS — I5020 Unspecified systolic (congestive) heart failure: Secondary | ICD-10-CM

## 2023-12-12 DIAGNOSIS — Z23 Encounter for immunization: Secondary | ICD-10-CM

## 2023-12-12 DIAGNOSIS — E559 Vitamin D deficiency, unspecified: Secondary | ICD-10-CM | POA: Diagnosis not present

## 2023-12-12 DIAGNOSIS — I25118 Atherosclerotic heart disease of native coronary artery with other forms of angina pectoris: Secondary | ICD-10-CM

## 2023-12-12 DIAGNOSIS — I2510 Atherosclerotic heart disease of native coronary artery without angina pectoris: Secondary | ICD-10-CM

## 2023-12-12 DIAGNOSIS — I10 Essential (primary) hypertension: Secondary | ICD-10-CM

## 2023-12-12 DIAGNOSIS — I340 Nonrheumatic mitral (valve) insufficiency: Secondary | ICD-10-CM

## 2023-12-12 LAB — URINALYSIS, ROUTINE W REFLEX MICROSCOPIC
Bilirubin Urine: NEGATIVE
Hgb urine dipstick: NEGATIVE
Ketones, ur: 15 — AB
Leukocytes,Ua: NEGATIVE
Nitrite: NEGATIVE
RBC / HPF: NONE SEEN (ref 0–?)
Specific Gravity, Urine: 1.015 (ref 1.000–1.030)
Total Protein, Urine: NEGATIVE
Urine Glucose: >=1000 — AB
Urobilinogen, UA: 0.2 (ref 0.0–1.0)
WBC, UA: NONE SEEN (ref 0–?)
pH: 6 (ref 5.0–8.0)

## 2023-12-12 LAB — BASIC METABOLIC PANEL
BUN: 18 mg/dL (ref 6–23)
CO2: 23 meq/L (ref 19–32)
Calcium: 9.9 mg/dL (ref 8.4–10.5)
Chloride: 92 meq/L — ABNORMAL LOW (ref 96–112)
Creatinine, Ser: 1.44 mg/dL (ref 0.40–1.50)
GFR: 52.36 mL/min — ABNORMAL LOW (ref >60.00)
Glucose, Bld: 411 mg/dL — ABNORMAL HIGH (ref 70–99)
Potassium: 4.9 meq/L (ref 3.5–5.1)
Sodium: 133 meq/L — ABNORMAL LOW (ref 135–145)

## 2023-12-12 LAB — HEPATIC FUNCTION PANEL
ALT: 9 U/L (ref 0–53)
AST: 11 U/L (ref 0–37)
Albumin: 4.3 g/dL (ref 3.5–5.2)
Alkaline Phosphatase: 133 U/L — ABNORMAL HIGH (ref 39–117)
Bilirubin, Direct: 0.1 mg/dL (ref 0.0–0.3)
Total Bilirubin: 0.4 mg/dL (ref 0.2–1.2)
Total Protein: 8.3 g/dL (ref 6.0–8.3)

## 2023-12-12 LAB — CBC WITH DIFFERENTIAL/PLATELET
Basophils Absolute: 0.1 10*3/uL (ref 0.0–0.1)
Basophils Relative: 0.9 % (ref 0.0–3.0)
Eosinophils Absolute: 0.1 10*3/uL (ref 0.0–0.7)
Eosinophils Relative: 1.3 % (ref 0.0–5.0)
HCT: 37.8 % — ABNORMAL LOW (ref 39.0–52.0)
Hemoglobin: 12.5 g/dL — ABNORMAL LOW (ref 13.0–17.0)
Lymphocytes Relative: 27 % (ref 12.0–46.0)
Lymphs Abs: 1.6 10*3/uL (ref 0.7–4.0)
MCHC: 33.1 g/dL (ref 30.0–36.0)
MCV: 88.9 fL (ref 78.0–100.0)
Monocytes Absolute: 0.6 10*3/uL (ref 0.1–1.0)
Monocytes Relative: 9.9 % (ref 3.0–12.0)
Neutro Abs: 3.7 10*3/uL (ref 1.4–7.7)
Neutrophils Relative %: 60.9 % (ref 43.0–77.0)
Platelets: 393 10*3/uL (ref 150.0–400.0)
RBC: 4.25 Mil/uL (ref 4.22–5.81)
RDW: 16.3 % — ABNORMAL HIGH (ref 11.5–15.5)
WBC: 6 10*3/uL (ref 4.0–10.5)

## 2023-12-12 LAB — LIPID PANEL
Cholesterol: 354 mg/dL — ABNORMAL HIGH (ref 0–200)
HDL: 39.8 mg/dL (ref >39.00)
LDL Cholesterol: 245 mg/dL — ABNORMAL HIGH (ref 0–99)
NonHDL: 314.12
Total CHOL/HDL Ratio: 9
Triglycerides: 345 mg/dL — ABNORMAL HIGH (ref 0.0–149.0)
VLDL: 69 mg/dL — ABNORMAL HIGH (ref 0.0–40.0)

## 2023-12-12 LAB — MICROALBUMIN / CREATININE URINE RATIO
Creatinine,U: 74.5 mg/dL
Microalb Creat Ratio: 26.4 mg/g (ref 0.0–30.0)
Microalb, Ur: 2 mg/dL — ABNORMAL HIGH (ref 0.0–1.9)

## 2023-12-12 LAB — VITAMIN D 25 HYDROXY (VIT D DEFICIENCY, FRACTURES): VITD: 16.73 ng/mL — ABNORMAL LOW (ref 30.00–100.00)

## 2023-12-12 LAB — PSA: PSA: 2.49 ng/mL (ref 0.10–4.00)

## 2023-12-12 LAB — VITAMIN B12: Vitamin B-12: 763 pg/mL (ref 211–911)

## 2023-12-12 LAB — TSH: TSH: 1.71 u[IU]/mL (ref 0.35–5.50)

## 2023-12-12 NOTE — Patient Instructions (Addendum)
 You had the Prevnar 20 pneumonia shot today  Please take all new medication as prescribed  Please continue all other medications as before, and refills have been done if requested.  Please have the pharmacy call with any other refills you may need.  Please continue your efforts at being more active, low cholesterol diet, and weight control.  You are otherwise up to date with prevention measures today.  Please keep your appointments with your specialists as you may have planned  You will be contacted regarding the referral for: eye doctor  Please go to the LAB at the blood drawing area for the tests to be done  You will be contacted by phone if any changes need to be made immediately.  Otherwise, you will receive a letter about your results with an explanation, but please check with MyChart first.  Please make an Appointment to return in 3 months, or sooner if needed

## 2023-12-12 NOTE — Progress Notes (Signed)
 Patient ID: [name], male   DOB: [birth year], 63 y.o.   MRN: [id number]         Chief Complaint:: wellness exam and medication mixup, dm new onset leaving feb 8 ED AMA,, low vit d, htn, hld, ckd3a       HPI:  [name] is a 63 y.o. male here for wellness exam; due for prevnar 20, declines covid booster and colonoscopy, for shingrix at pharmacy, o/w up to date                        Also Pt with recent med mixup it seems.   Copied from CRM [id number]. Topic: Clinical - Prescription Issue >> Nov 25, 2023  4:50 PM Corin V wrote: Reason for CRM: Pharmacy called to let Dr. Jonny Ruiz know that there was an error with the labeling of his medicaiton bottles and the ezetimibe (ZETIA) 10 MG tablet bottle had a  rosuvastatin (CRESTOR) 40 MG tablet bottle label on it, so he was taking 2 doses of the Zetia and no Crestor for 2-3 days.    Pt denies chest pain, increased sob or doe, wheezing, orthopnea, PND, increased LE swelling, palpitations, dizziness or syncope.   Pt denies polydipsia, polyuria, or new focal neuro s/s.    Pt denies fever, night sweats, loss of appetite, or other constitutional symptoms, though has lost significant wt with higher sugars recently.    Denies worsening reflux, abd pain, dysphagia, n/v, bowel change or blood.    Wt Readings from Last 3 Encounters:  12/12/23 203 lb (92.1 kg)  11/28/23 227 lb (103 kg)  11/28/23 236 lb 15.9 oz (107.5 kg)   BP Readings from Last 3 Encounters:  12/12/23 (!) 142/78  11/29/23 136/73  11/28/23 96/61   Immunization History  Administered Date(s) Administered   Influenza,inj,Quad PF,6+ Mos 07/16/2019, 10/11/2020, 08/07/2023   Influenza-Unspecified 08/26/2022   Janssen (J&J) SARS-COV-2 Vaccination 12/28/2019   PNEUMOCOCCAL CONJUGATE-20 12/12/2023   Pfizer Covid-19 Vaccine Bivalent Booster 48yrs & up 08/07/2023   Tdap 04/12/2019   Health Maintenance Due  Topic Date Due   Zoster Vaccines- Shingrix (1 of 2) Never done   Medicare Annual  Wellness (AWV)  07/13/2020   Colonoscopy  09/08/2023   OPHTHALMOLOGY EXAM  09/10/2023   COVID-19 Vaccine (3 - 2024-25 season) 10/02/2023      Past Medical History:  Diagnosis Date   Anemia 01/01/2017   Ascending aorta dilation (HCC)    Echocardiogram 2/22: EF 40-45, mild LVH, normal RVSF, mild to mod MR, mild to mod AI, ascending aorta 45 mm.     CAD (coronary artery disease)    a. Overlapping BMS-prox LCx in 2009 b. NSTEMI 2012 due to prox LCx ISR s/p PTCA alone; EF 45% w/ basal inf HK. Had run out of Plavix 1 week prior. c. NSTEMI 05/2015 after running out of Plavix - cath with 3V CAD, Patient left AMA prior to agreeing to CABG. // d. LHC 8/16: LM 60; LAD mid 60, D1 75; RI 50, 80; LCx prox stent 100; RCA prox 100; EF 45-50 >> seen by [facility] - declined CABG >> Med Rx.   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   Hypertension    MI (myocardial infarction) (HCC) x 7   Poor compliance    Seizure disorder (HCC)    Seizures (HCC)    TOBACCO ABUSE 02/22/2009   Valvular heart disease    a. Echo 05/2015: mild AI, mod MR.   Past Surgical History:  Procedure Laterality Date   CARDIAC CATHETERIZATION [name] 05/29/2015   Procedure: Left Heart Cath and Coronary Angiography;  Surgeon: Lyn Records, MD;  Location: [facility] INVASIVE CV LAB;  Service: Cardiovascular;  Laterality: [name];   COLONOSCOPY     CORONARY ANGIOPLASTY  03/2011   PTCA-prox LCx ISR   CORONARY ANGIOPLASTY WITH  STENT PLACEMENT  10/2007   BMS-prox LCx   LEFT HEART CATHETERIZATION WITH CORONARY ANGIOGRAM [name] 10/13/2013   Procedure: LEFT HEART CATHETERIZATION WITH CORONARY ANGIOGRAM;  Surgeon: Micheline Chapman, MD;  Location[facility] CATH LAB;  Service: Cardiovascular;  Laterality: [name];   TEE WITHOUT CARDIOVERSION [name] 06/16/2015   Procedure: TRANSESOPHAGEAL ECHOCARDIOGRAM (TEE);  Surgeon: Wendall Stade, MD;  Location: [facility] ENDOSCOPY;  Service: Cardiovascular;  Laterality: [name];    reports that he has quit smoking. His smoking use included cigars. He has never used smokeless tobacco. He reports that he does not drink alcohol and does not use drugs. family history includes Atrial fibrillation in his brother; Coronary artery disease in his maternal aunt; Heart attack in his maternal uncle; Heart disease in his maternal uncle; Hypertension in his brother and another family member; Stroke in his maternal uncle. No Active Allergies Current Outpatient Medications on File Prior to Visit  Medication Sig Dispense Refill   albuterol (VENTOLIN HFA) 108 (90 Base) MCG/ACT inhaler INHALE 2 PUFFS INTO THE LUNGS EVERY 6 HOURS AS NEEDED FOR WHEEZING OR SHORTNESS OF BREATH 6.7 g 2   Cholecalciferol (VITAMIN D3) 50 MCG (2000 UT) capsule Take 1 capsule (2,000 Units total) by mouth daily. 90 capsule 99   clopidogrel (PLAVIX) 75 MG tablet TAKE 1 TABLET(75 MG) BY MOUTH DAILY 90 tablet 3   furosemide (LASIX) 40 MG tablet TAKE 1 TABLET BY MOUTH EVERY DAY AS NEEDED FOR EDEMA(TAKE FOR WEIGHT GAIN OVER 3 POUNDS IN 1 DAY OR LEG SWELLING) 90 tablet 3   levETIRAcetam (KEPPRA) 250 MG tablet Take 1 tablet (250 mg total) by mouth daily. 1 tablet daily x 1 month and stop 60 tablet 11   nitroGLYCERIN (NITROSTAT) 0.4 MG SL tablet PLACE 1 TABLET UNDER TONGUE EVERY 5 MINUTES AS NEEDED FOR CHEST PAIN. IF NO RELIEF AFTER 3 DOSES, CALL 911 25 tablet 10   PHENobarbital (LUMINAL) 32.4 MG tablet Take 1 tablet (32.4 mg total) by mouth 4 (four) times daily. 360  tablet 1   tamsulosin (FLOMAX) 0.4 MG CAPS capsule Take 0.4 mg by mouth daily.     No current facility-administered medications on file prior to visit.        ROS:  All others reviewed and negative.  Objective        PE:  BP (!) 142/78 (BP Location: Left Arm, Patient Position: Sitting, Cuff Size: Normal)   Pulse (!) 120   Temp 98.8 F (37.1 C) (Oral)  Ht 6\' 2"  (1.88 m)   Wt 203 lb (92.1 kg)   SpO2 98%   BMI 26.06 kg/m                 Constitutional: Pt appears in NAD               HENT: Head: NCAT.                Right Ear: External ear normal.                 Left Ear: External ear normal.                Eyes: . Pupils are equal, round, and reactive to light. Conjunctivae and EOM are normal               Nose: without d/c or deformity               Neck: Neck supple. Gross normal ROM               Cardiovascular: Normal rate and regular rhythm.                 Pulmonary/Chest: Effort normal and breath sounds without rales or wheezing.                Abd:  Soft, NT, ND, + BS, no organomegaly               Neurological: Pt is alert. At baseline orientation, motor grossly intact               Skin: Skin is warm. No rashes, no other new lesions, LE edema - none               Psychiatric: Pt behavior is normal without agitation   Micro: none  Cardiac tracings I have personally interpreted today:  none  Pertinent Radiological findings (summarize): none   Lab Results  Component Value Date   WBC 6.0 12/12/2023   HGB 12.5 (L) 12/12/2023   HCT 37.8 (L) 12/12/2023   PLT 393.0 12/12/2023   GLUCOSE 411 (H) 12/12/2023   CHOL 354 (H) 12/12/2023   TRIG 345.0 (H) 12/12/2023   HDL 39.80 12/12/2023   LDLCALC 245 (H) 12/12/2023   ALT 9 12/12/2023   AST 11 12/12/2023   NA 133 (L) 12/12/2023   K 4.9 12/12/2023   CL 92 (L) 12/12/2023   CREATININE 1.44 12/12/2023   BUN 18 12/12/2023   CO2 23 12/12/2023   TSH 1.71 12/12/2023   PSA 2.49 12/12/2023   INR 1.01 05/27/2015   HGBA1C  >14.0 (H) 12/12/2023   MICROALBUR 2.0 (H) 12/12/2023   Assessment/Plan:  [name] is a 63 y.o. 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  Encounter for well adult exam with abnormal findings Age and sex appropriate education and counseling updated with regular exercise and diet Referrals for preventative services - declines colonoscopy and covid booster Immunizations addressed - for prevnar 20 Smoking counseling  - none needed Evidence for depression or other mood disorder - none significant Most recent labs reviewed. I have personally  reviewed and have noted: 1) the patient's medical and social history 2) The patient's current medications and supplements 3) The patient's height, weight, and BMI have been recorded in the chart   Vitamin D deficiency Last vitamin D Lab Results  Component Value Date   VD25OH 16.73 (L) 12/12/2023   Low, to start oral replacement   Pure hypercholesterolemia Lab Results  Component Value Date   LDLCALC 245 (H) 12/12/2023   Severe, apparently has not been taking crestor recently, pt to restart statin crestor 40 mg every day, and zetia 10 qd   Essential hypertension BP Readings from Last 3 Encounters:  12/12/23 (!) 142/78  11/29/23 136/73  11/28/23 96/61   Uncontrolled, pt to restart meds - coreg 3.125 bid.    CKD (chronic kidney disease) stage 3, GFR 30-59 ml/min (HCC) Lab Results  Component Value Date   CREATININE 1.44 12/12/2023   Stable overall, cont to avoid  nephrotoxins   Uncontrolled type 2 diabetes mellitus with hyperglycemia (HCC) Lab Results  Component Value Date   HGBA1C >14.0 (H) 12/12/2023   Recent severe uncontrolled hyperosmolar and recent wt loss having left ED AMA, for eye exam referral, also refer pharmacy for med adherence and education. Also to start multiple OHA given severity of A1c, pt for f/u at 1 -2 wks  Followup: Return in about 3 months (around 03/10/2024).  Oliver Barre, MD 12/14/2023 4:47 PM [facility]

## 2023-12-13 LAB — HEMOGLOBIN A1C: Hgb A1c MFr Bld: >14 %{Hb} — ABNORMAL HIGH (ref <5.7)

## 2023-12-14 ENCOUNTER — Encounter: Payer: Self-pay | Admitting: Internal Medicine

## 2023-12-14 MED ORDER — ISOSORBIDE MONONITRATE ER 30 MG PO TB24
ORAL_TABLET | ORAL | 3 refills | Status: AC
Start: 2023-12-14 — End: ?

## 2023-12-14 MED ORDER — ROSUVASTATIN CALCIUM 40 MG PO TABS: ORAL_TABLET | ORAL | 3 refills | Status: AC

## 2023-12-14 MED ORDER — LISINOPRIL 2.5 MG PO TABS: 2.5000 mg | ORAL_TABLET | Freq: Every day | ORAL | 3 refills | Status: AC

## 2023-12-14 MED ORDER — EZETIMIBE 10 MG PO TABS: 10.0000 mg | ORAL_TABLET | Freq: Every day | ORAL | 3 refills | Status: AC

## 2023-12-14 MED ORDER — CARVEDILOL 3.125 MG PO TABS: 3.1250 mg | ORAL_TABLET | Freq: Two times a day (BID) | ORAL | 3 refills | Status: DC

## 2023-12-14 MED ORDER — PIOGLITAZONE HCL 45 MG PO TABS: 45.0000 mg | ORAL_TABLET | Freq: Every day | ORAL | 3 refills | Status: DC

## 2023-12-14 MED ORDER — GLIPIZIDE ER 10 MG PO TB24: 10.0000 mg | ORAL_TABLET | Freq: Every day | ORAL | 3 refills | Status: DC

## 2023-12-14 NOTE — Assessment & Plan Note (Signed)
 Lab Results  Component Value Date   LDLCALC 245 (H) 12/12/2023   Severe, apparently has not been taking crestor recently, pt to restart statin crestor 40 mg every day, and zetia 10 qd

## 2023-12-14 NOTE — Assessment & Plan Note (Signed)
 Lab Results  Component Value Date   CREATININE 1.44 12/12/2023   Stable overall, cont to avoid nephrotoxins

## 2023-12-14 NOTE — Assessment & Plan Note (Signed)
 BP Readings from Last 3 Encounters:  12/12/23 (!) 142/78  11/29/23 136/73  11/28/23 96/61   Uncontrolled, pt to restart meds - coreg 3.125 bid.

## 2023-12-14 NOTE — Assessment & Plan Note (Signed)
 Last vitamin D Lab Results  Component Value Date   VD25OH 16.73 (L) 12/12/2023   Low, to start oral replacement

## 2023-12-14 NOTE — Assessment & Plan Note (Signed)
 Age and sex appropriate education and counseling updated with regular exercise and diet Referrals for preventative services - declines colonoscopy and covid booster Immunizations addressed - for prevnar 20 Smoking counseling  - none needed Evidence for depression or other mood disorder - none significant Most recent labs reviewed. I have personally reviewed and have noted: 1) the patient's medical and social history 2) The patient's current medications and supplements 3) The patient's height, weight, and BMI have been recorded in the chart

## 2023-12-14 NOTE — Assessment & Plan Note (Signed)
 Lab Results  Component Value Date   HGBA1C >14.0 (H) 12/12/2023   Recent severe uncontrolled hyperosmolar and recent wt loss having left ED AMA, for eye exam referral, also refer pharmacy for med adherence and education. Also to start multiple OHA given severity of A1c, pt for f/u at 1 -2 wks

## 2023-12-16 ENCOUNTER — Telehealth: Payer: Self-pay

## 2023-12-16 NOTE — Progress Notes (Signed)
 Care Guide Pharmacy Note  12/16/2023 Name: [name] MRN: [id number] DOB: December 02, 1960  Referred By: Corwin Levins, MD Reason for referral: Care Coordination (Outreach to schedule with pharm d )   [name] is a 63 y.o. year old male who is a primary care patient of Corwin Levins, MD.  Darin Engels was referred to the pharmacist for assistance related to: DMII  Successful contact was made with the patient to discuss pharmacy services including being ready for the pharmacist to call at least 5 minutes before the scheduled appointment time and to have medication bottles and any blood pressure readings ready for review. The patient agreed to meet with the pharmacist via telephone visit on (date/time).12/30/2023  Penne Lash , RMA     [place]  [facility], [facility] Guide  Direct Dial: [phone]  Website: [email or web address]

## 2023-12-29 ENCOUNTER — Telehealth: Payer: Self-pay | Admitting: Cardiovascular Disease

## 2023-12-29 NOTE — Telephone Encounter (Signed)
OK - thanks for letting me know

## 2023-12-29 NOTE — Telephone Encounter (Signed)
 Pt c/o medication issue:  1. Name of Medication: rosuvastatin (CRESTOR) 40 MG tablet   ezetimibe (ZETIA) 10 MG tablet    2. How are you currently taking this medication (dosage and times per day)?  TAKE 1 TABLET BY MOUTH EVERY DAY     Take 1 tablet (10 mg total) by mouth daily.      3. Are you having a reaction (difficulty breathing--STAT)? No  4. What is your medication issue? Pt is requesting a callback regarding medication mix up at pharmacy. Please advised

## 2023-12-29 NOTE — Telephone Encounter (Signed)
 Pt called to report that the Pharmacy gave him the wrong medication in his bottle that was labeled Ezetimibe.... it was filled with Rosuvastatin instead... he has been taking double dose of the Rosuvastatin 40 mg.... taking 80 mg a day... he says he was having dizziness so he went to the ED and his blood sugar was 800... he says that he has been in touch with DR Jonny Ruiz and he is monitoring his BS... he stopped all cholesterol...he says he is talking with an attorney.... I advised him that I will forward to Dr Excell Seltzer for his review.

## 2023-12-30 ENCOUNTER — Other Ambulatory Visit: Payer: 59

## 2023-12-30 ENCOUNTER — Telehealth: Payer: Self-pay | Admitting: *Deleted

## 2023-12-30 NOTE — Progress Notes (Unsigned)
 Complex Care Management Care Guide Note  12/30/2023 Name: [name] MRN: [id number] DOB: Feb 26, 1961  [name] is a 63 y.o. year old male who is a primary care patient of Corwin Levins, MD and is actively engaged with the care management team. I reached out to Darin Engels by phone today to assist with re-scheduling  with the Pharmacist.  Follow up plan: Unsuccessful telephone outreach attempt made. A HIPAA compliant phone message was left for the patient providing contact information and requesting a return call.  Burman Nieves, CMA, Care Guide [facility]  [facility], [facility] Guide Direct Dial: [phone]  Fax: [phone] Website: [email or web address]

## 2023-12-31 NOTE — Progress Notes (Signed)
 Complex Care Management Care Guide Note  12/31/2023 Name: JOHNMARK GEIGER MRN: [id number] DOB: Jan 21, 1961  KENON DELASHMIT is a 63 y.o. year old male who is a primary care patient of Corwin Levins, MD and is actively engaged with the care management team. I reached out to Darin Engels by phone today to assist with re-scheduling  with the Pharmacist.  Follow up plan: Telephone appointment with complex care management team member scheduled for:  01/19/2024  Burman Nieves, CMA, Care Guide [facility], [facility] Guide Direct Dial: [phone]  Fax: [phone] Website: [email or web address]

## 2023-12-31 NOTE — Progress Notes (Signed)
 Complex Care Management Care Guide Note  12/31/2023 Name: [name] MRN: [id number] DOB: 30-Aug-1961  [name] is a 63 y.o. year old male who is a primary care patient of Corwin Levins, MD and is actively engaged with the care management team. I reached out to Darin Engels by phone today to assist with re-scheduling  with the Pharmacist.  Follow up plan: Unsuccessful telephone outreach attempt made. A HIPAA compliant phone message was left for the patient providing contact information and requesting a return call. No further outreach attempts will be made due to inability to maintain patient contact.   Burman Nieves, CMA, Care Guide [facility]  [facility], [facility] Guide Direct Dial: [phone]  Fax: [phone] Website: [email or web address]

## 2024-01-13 ENCOUNTER — Telehealth: Payer: Self-pay

## 2024-01-13 NOTE — Telephone Encounter (Signed)
 Patient was identified as falling into the True North Measure - Diabetes.   Patient was: Appointment scheduled for lab or office visit for A1c.  Patient is on a 6 month schedule, due for A1c in August, scheduled for a follow up in May

## 2024-01-17 ENCOUNTER — Telehealth: Payer: Self-pay | Admitting: Neurology

## 2024-01-17 MED ORDER — PHENOBARBITAL 32.4 MG PO TABS: 32.4000 mg | ORAL_TABLET | Freq: Four times a day (QID) | ORAL | 0 refills | Status: DC

## 2024-01-17 MED ORDER — PHENOBARBITAL 32.4 MG PO TABS
32.4000 mg | ORAL_TABLET | Freq: Four times a day (QID) | ORAL | 0 refills | Status: DC
Start: 2024-01-17 — End: 2024-01-17

## 2024-01-17 NOTE — Telephone Encounter (Signed)
 Patient called back at 23; 38 hours requesting the phenobarbital refill to go to [address] .  I wrote a new prescription.

## 2024-01-17 NOTE — Telephone Encounter (Signed)
 Patient left a call on the weekend for a scheduled drug to be refilled. He has not been seen at [facility] for 10 months , he stated he is a patient of Dr Epimenio Foot but he is under  Dr Marlis Edelson care for strokes / seizures.  He also stated he has been" out of phenobarbital for 4 days " and that his pharmacy had called our office for refills -  His last refills were written by myself while on call- on 06-30-2023. And last medication review by his PCP was  on 12-14-2023.   There no refill requests that I could find.   His Keppra was also last refilled in MAY 2024 with a note that he can stop the medication ?  This patient has to make a follow up appointment to receive refills.   Melvyn Novas, MD

## 2024-01-18 ENCOUNTER — Telehealth: Payer: Self-pay | Admitting: Neurology

## 2024-01-18 MED ORDER — PHENOBARBITAL 32.4 MG PO TABS: 32.4000 mg | ORAL_TABLET | Freq: Four times a day (QID) | ORAL | 0 refills | Status: DC

## 2024-01-18 MED ORDER — PHENOBARBITAL 32.4 MG PO TABS
32.4000 mg | ORAL_TABLET | Freq: Four times a day (QID) | ORAL | 1 refills | Status: DC
Start: 2024-01-18 — End: 2024-01-20

## 2024-01-18 NOTE — Telephone Encounter (Signed)
 Patient hs still not received the medication.

## 2024-01-18 NOTE — Telephone Encounter (Signed)
 Patient reports his chosen pharmacy still hasn't gotten the prescription of Phenobarbital.  3=4 times daily 32.4 mg, [address], [place], [place].

## 2024-01-18 NOTE — Telephone Encounter (Signed)
 Patient states he still hasn't received prescription at [facility] at midnight, called again.

## 2024-01-19 ENCOUNTER — Other Ambulatory Visit (INDEPENDENT_AMBULATORY_CARE_PROVIDER_SITE_OTHER): Admitting: Pharmacist

## 2024-01-19 ENCOUNTER — Telehealth: Payer: Self-pay | Admitting: Neurology

## 2024-01-19 DIAGNOSIS — E1165 Type 2 diabetes mellitus with hyperglycemia: Secondary | ICD-10-CM

## 2024-01-19 MED ORDER — BLOOD GLUCOSE MONITORING SUPPL DEVI: 1.0000 | Freq: Every day | 0 refills | Status: AC

## 2024-01-19 MED ORDER — BLOOD GLUCOSE TEST VI STRP: 1.0000 | ORAL_STRIP | Freq: Every day | 0 refills | Status: DC

## 2024-01-19 MED ORDER — LANCET DEVICE MISC: 1.0000 | Freq: Every day | 0 refills | Status: AC

## 2024-01-19 MED ORDER — LANCETS MISC. MISC: 1.0000 | Freq: Every day | 0 refills | Status: AC

## 2024-01-19 NOTE — Patient Instructions (Signed)
 It was a pleasure speaking with you today!  Begin checking blood sugars once daily, every morning before food/drink. Keep a log for [place] to go over the readings in 1 week.  Watch high carbohydrate and high sugar foods/drinks to help lower blood sugars.  Feel free to call with any questions or concerns!  Arbutus Leas, PharmD, BCPS, CPP Clinical Pharmacist Practitioner [place] [facility] at [facility] [phone]

## 2024-01-19 NOTE — Telephone Encounter (Signed)
 Please see other phone message

## 2024-01-19 NOTE — Progress Notes (Signed)
 01/19/2024 Name: [name] MRN: [id number] DOB: 1960/12/11  Chief Complaint  Patient presents with   Diabetes   Medication Management   True North Metric Diabetes    [name] is a 63 y.o. year old male who presented for a telephone visit.   They were referred to the pharmacist by their PCP for assistance in managing diabetes.   Subjective:  Care Team: Primary Care Provider: Corwin Levins, MD ; Next Scheduled Visit: 03/10/24  Medication Access/Adherence  Current Pharmacy:  [id number] - [place], [place] - [address] AT [facility] OF [address] [place] [address] Phone: [phone] Fax: [phone]   Patient reports affordability concerns with their medications: No  Patient reports access/transportation concerns to their pharmacy: No  Patient reports adherence concerns with their medications:  Yes    Pt is not taking glipizide, pioglitazone. Is not taking rosuvastatin or ezetimibe due to pharmacy filling these prescriptions incorrectly causing him to take double the rosuvastatin dose.   Diabetes:  Current medications: None  Refuses metformin. Has not tried glipizide or pioglitazone.  Notes he feels much better than he did after taking double dose of the rosuvastatin due to pharmacy error, per patient report. Pt believes the rosuvastatin double dose is what caused BG to be so elevated.  Does not have a glucometer   Current meal patterns: Only eats once per day. Drinks fruit juices daily. Stopped drinking sodas.    Objective:  Lab Results  Component Value Date   HGBA1C >14.0 (H) 12/12/2023    Lab Results  Component Value Date   CREATININE 1.44 12/12/2023   BUN 18 12/12/2023   NA 133 (L) 12/12/2023   K 4.9 12/12/2023   CL 92 (L) 12/12/2023   CO2 23 12/12/2023    Lab Results  Component Value Date   CHOL 354 (H) 12/12/2023   HDL 39.80 12/12/2023   LDLCALC 245 (H) 12/12/2023   TRIG  345.0 (H) 12/12/2023   CHOLHDL 9 12/12/2023    Medications Reviewed Today     Reviewed by Bonita Quin, RPH (Pharmacist) on 01/19/24 at 1636  Med List Status: <None>   Medication Order Taking? Sig Documenting Provider Last Dose Status Informant  albuterol (VENTOLIN HFA) 108 (90 Base) MCG/ACT inhaler [id number]  INHALE 2 PUFFS INTO THE LUNGS EVERY 6 HOURS AS NEEDED FOR WHEEZING OR SHORTNESS OF Herschel Senegal, MD  Active   Blood Glucose Monitoring Suppl DEVI [id number] Yes 1 each by Does not apply route daily. May substitute to any manufacturer covered by patient's insurance. Dx E11.65 Corwin Levins, MD  Active   carvedilol (COREG) 3.125 MG tablet [id number]  Take 1 tablet (3.125 mg total) by mouth 2 (two) times daily with a meal. Corwin Levins, MD  Active   Cholecalciferol (VITAMIN D3) 50 MCG (2000 UT) capsule [id number]  Take 1 capsule (2,000 Units total) by mouth daily. Corwin Levins, MD  Active Self  clopidogrel (PLAVIX) 75 MG tablet [id number]  TAKE 1 TABLET(75 MG) BY MOUTH DAILY Swinyer, Zachary George, NP  Active   ezetimibe (ZETIA) 10 MG tablet [id number] No Take 1 tablet (10 mg total) by mouth daily.  Patient not taking: Reported on 01/19/2024   Corwin Levins, MD Not Taking Active   furosemide (LASIX) 40 MG tablet [id number]  TAKE 1 TABLET BY MOUTH EVERY DAY AS NEEDED FOR EDEMA(TAKE FOR WEIGHT GAIN OVER 3 POUNDS IN 1 DAY  OR LEG SWELLING) Corwin Levins, MD  Active Self  glipiZIDE (GLUCOTROL XL) 10 MG 24 hr tablet [id number] No Take 1 tablet (10 mg total) by mouth daily with breakfast.  Patient not taking: Reported on 01/19/2024   Corwin Levins, MD Not Taking Active   Glucose Blood (BLOOD GLUCOSE TEST STRIPS) STRP [id number] Yes 1 each by In Vitro route daily. May substitute to any manufacturer covered by patient's insurance.Dx E11.65 Corwin Levins, MD  Active   isosorbide mononitrate (IMDUR) 30 MG 24 hr tablet [id number]  TAKE 1/2 TABLET BY MOUTH DAILY Corwin Levins, MD  Active   Lancet  Device MISC [id number] Yes 1 each by Does not apply route daily. May substitute to any manufacturer covered by patient's insurance. Dx E11.65 Corwin Levins, MD  Active   Lancets Misc. MISC [id number] Yes 1 each by Does not apply route daily. May substitute to any manufacturer covered by patient's insurance. Dx E11.65 Corwin Levins, MD  Active   levETIRAcetam (KEPPRA) 250 MG tablet [id number]  Take 1 tablet (250 mg total) by mouth daily. 1 tablet daily x 1 month and stop Micki Riley, MD  Active   lisinopril (ZESTRIL) 2.5 MG tablet [id number]  Take 1 tablet (2.5 mg total) by mouth daily. Corwin Levins, MD  Active   nitroGLYCERIN (NITROSTAT) 0.4 MG SL tablet [id number]  PLACE 1 TABLET UNDER TONGUE EVERY 5 MINUTES AS NEEDED FOR CHEST PAIN. IF NO RELIEF AFTER 3 DOSES, CALL 911 Tonny Bollman, MD  Active   PHENobarbital (LUMINAL) 32.4 MG tablet [id number]  Take 1 tablet (32.4 mg total) by mouth 4 (four) times daily. NO refills without a revisit with Dr Pearlean Brownie  . Dohmeier, Porfirio Mylar, MD  Active   pioglitazone (ACTOS) 45 MG tablet [id number] No Take 1 tablet (45 mg total) by mouth daily.  Patient not taking: Reported on 01/19/2024   Corwin Levins, MD Not Taking Active   rosuvastatin (CRESTOR) 40 MG tablet [id number] No TAKE 1 TABLET BY MOUTH EVERY DAY  Patient not taking: Reported on 01/19/2024   Corwin Levins, MD Not Taking Active   tamsulosin [facility]) 0.4 MG CAPS capsule [id number]  Take 0.4 mg by mouth daily. [provider]  Active               Assessment/Plan:   Diabetes: - Currently uncontrolled, A1c goal <7% - Reviewed long term cardiovascular and renal outcomes of uncontrolled blood sugar - Reviewed goal A1c, goal fasting - Reviewed dietary modifications including decreasing high carb/sugar foods and beverages - Recommend to begin checking BG once daily x 1 week and keep a log to determine best treatment approach since pt does not believe BG are still elevated - Sent glucometer - Need  education on food/beverage and medications at follow up   Follow Up Plan: 4/10  Arbutus Leas, PharmD, BCPS, CPP Clinical Pharmacist Practitioner [place] [facility] at [facility] [phone]

## 2024-01-19 NOTE — Telephone Encounter (Signed)
 Pt called stating that the oncall provider called in a few PHENobarbital (LUMINAL) 32.4 MG tablet but now the pharmacy is saying he is needing a PA in order for him to get the rest of his medications. Pt is needing it sent to the [facility] on [address]. Pt states they close at 7pm today. Please advise.

## 2024-01-20 NOTE — Telephone Encounter (Signed)
 Can this be looked into please.

## 2024-01-20 NOTE — Telephone Encounter (Signed)
 Spoke w/pharmacy at [facility] on [address]. They will cancel the phenobarbitol orders they are holding since refill order will be sent to another location.

## 2024-01-20 NOTE — Telephone Encounter (Signed)
 Spoke w/Pt regarding medication refill and made him aware the request has been routed to Dr. Pearlean Brownie to review and if approves will send to [facility] on [address] as Pt requested. Pt asked if we can call and cancel the scripts on hold at [facility] on [address]. Affirmed w/Pt that we will make that call.

## 2024-01-20 NOTE — Telephone Encounter (Signed)
 Dr. Pearlean Brownie,  Please send in a phenobarbitol refill electronically RX below for esign.    Late Entry from 01/19/24 at 17:27 - Spoke w/pharmacy team at [facility] on [address]. Pharmacy stated Pt picked up 16 tabs of a bridge script from [facility] on [address], then came to the [address] location asking for the rest of his prescription as he usually gets quantity of 360. Pharmacy stated they informed Pt they did not have a script for the medication for him so they sent a refill request to Dr. Vickey Huger for the full amount of the prescription. Dr. Oliva Bustard basket was checked and there was no refill request for this Pt. It appears that Dr. Vickey Huger attempted the refill over the weekend but the script printed and was not sent. I will ask Dr. Pearlean Brownie to send a refill request.

## 2024-01-21 ENCOUNTER — Other Ambulatory Visit: Payer: Self-pay

## 2024-01-21 ENCOUNTER — Other Ambulatory Visit (HOSPITAL_COMMUNITY): Payer: Self-pay

## 2024-01-21 ENCOUNTER — Telehealth: Payer: Self-pay

## 2024-01-21 MED ORDER — PHENOBARBITAL 32.4 MG PO TABS: 32.4000 mg | ORAL_TABLET | Freq: Four times a day (QID) | ORAL | 1 refills | Status: DC

## 2024-01-21 NOTE — Telephone Encounter (Signed)
 Pharmacy Patient Advocate Encounter   Received notification from Physician's Office that prior authorization for PHENobarbital 32.4MG  tablet (LUMINAL) is required/requested.   Insurance verification completed.   The patient is insured through [employer] [place] .   Per test claim: The current 30 day co-pay is, $0.  No PA needed at this time. This test claim was processed through [facility]- copay amounts may vary at other pharmacies due to pharmacy/plan contracts, or as the patient moves through the different stages of their insurance plan.

## 2024-01-21 NOTE — Telephone Encounter (Signed)
 Spoke w/Pt regarding refill request. Verified refill for phenobarbitol was sent to [facility] on [address] and confirmed received by pharmacy. Pt stated much thanks.

## 2024-01-21 NOTE — Telephone Encounter (Signed)
 See telephone note from yesterday (4/1).

## 2024-01-29 ENCOUNTER — Other Ambulatory Visit (INDEPENDENT_AMBULATORY_CARE_PROVIDER_SITE_OTHER): Admitting: Pharmacist

## 2024-01-29 DIAGNOSIS — E1165 Type 2 diabetes mellitus with hyperglycemia: Secondary | ICD-10-CM

## 2024-01-29 NOTE — Progress Notes (Signed)
 01/29/2024 Name: [name] MRN: [id number] DOB: [birth year]  Chief Complaint  Patient presents with   True [place] Metric Diabetes    [name] is a 63 y.o. year old male who presented for a telephone visit.   They were referred to the pharmacist by their PCP for assistance in managing diabetes.   Subjective:  Care Team: Primary Care Provider: Corwin Levins, MD ; Next Scheduled Visit: 03/10/24  Medication Access/Adherence  Current Pharmacy:  [facility] #[id number] - , [place] - [address] AT [facility] OF [address] & [address] [place] [address] Phone: [phone] Fax: [phone]   Patient reports affordability concerns with their medications: No  Patient reports access/transportation concerns to their pharmacy: No  Patient reports adherence concerns with their medications:  Yes    Pt is not taking glipizide, pioglitazone. Is not taking rosuvastatin or ezetimibe due to pharmacy filling these prescriptions incorrectly causing him to take double the rosuvastatin dose.   Diabetes:  Current medications: glipizide XL 10 mg daily (started within the last week) Cannot take metformin due to GI upset. Has not tried pioglitazone.  Notes he feels much better than he did after taking double dose of the rosuvastatin due to pharmacy error, per patient report. Pt believes the rosuvastatin double dose is what caused BG to be so elevated.  Did receive the glucometer but has not checked BG yet  He notes he has tolerated glipizide well.  Current meal patterns: Only eats once per day. Drinks fruit juices daily. Stopped drinking sodas. Eats [place] food often    Objective:  Lab Results  Component Value Date   HGBA1C >14.0 (H) 12/12/2023    Lab Results  Component Value Date   CREATININE 1.44 12/12/2023   BUN 18 12/12/2023   NA 133 (L) 12/12/2023   K 4.9 12/12/2023   CL 92 (L) 12/12/2023   CO2 23 12/12/2023    Lab Results   Component Value Date   CHOL 354 (H) 12/12/2023   HDL 39.80 12/12/2023   LDLCALC 245 (H) 12/12/2023   TRIG 345.0 (H) 12/12/2023   CHOLHDL 9 12/12/2023    Medications Reviewed Today     Reviewed by Bonita Quin, RPH (Pharmacist) on 01/29/24 at 1614  Med List Status: <None>   Medication Order Taking? Sig Documenting Provider Last Dose Status Informant  albuterol (VENTOLIN HFA) 108 (90 Base) MCG/ACT inhaler [id number]  INHALE 2 PUFFS INTO THE LUNGS EVERY 6 HOURS AS NEEDED FOR WHEEZING OR SHORTNESS OF Herschel Senegal, MD  Active   Blood Glucose Monitoring Suppl DEVI [id number]  1 each by Does not apply route daily. May substitute to any manufacturer covered by patient's insurance. Dx E11.65 Corwin Levins, MD  Active   carvedilol (COREG) 3.125 MG tablet [id number]  Take 1 tablet (3.125 mg total) by mouth 2 (two) times daily with a meal. Corwin Levins, MD  Active   Cholecalciferol (VITAMIN D3) 50 MCG (2000 UT) capsule [id number]  Take 1 capsule (2,000 Units total) by mouth daily. Corwin Levins, MD  Active Self  clopidogrel (PLAVIX) 75 MG tablet [id number]  TAKE 1 TABLET(75 MG) BY MOUTH DAILY Swinyer, Zachary George, NP  Active   ezetimibe (ZETIA) 10 MG tablet [id number]  Take 1 tablet (10 mg total) by mouth daily.  Patient not taking: Reported on 01/19/2024   Corwin Levins, MD  Active   furosemide (LASIX) 40 MG tablet [id number]  TAKE  1 TABLET BY MOUTH EVERY DAY AS NEEDED FOR EDEMA(TAKE FOR WEIGHT GAIN OVER 3 POUNDS IN 1 DAY OR LEG SWELLING) Corwin Levins, MD  Active Self  glipiZIDE (GLUCOTROL XL) 10 MG 24 hr tablet [id number] Yes Take 1 tablet (10 mg total) by mouth daily with breakfast. Corwin Levins, MD Taking Active   Glucose Blood (BLOOD GLUCOSE TEST STRIPS) STRP [id number]  1 each by In Vitro route daily. May substitute to any manufacturer covered by patient's insurance.Dx E11.65 Corwin Levins, MD  Active   isosorbide mononitrate (IMDUR) 30 MG 24 hr tablet [id number]  TAKE 1/2 TABLET BY MOUTH  DAILY Corwin Levins, MD  Active   Lancet Device MISC [id number]  1 each by Does not apply route daily. May substitute to any manufacturer covered by patient's insurance. Dx E11.65 Corwin Levins, MD  Active   Lancets Misc. MISC [id number]  1 each by Does not apply route daily. May substitute to any manufacturer covered by patient's insurance. Dx E11.65 Corwin Levins, MD  Active   levETIRAcetam (KEPPRA) 250 MG tablet [id number]  Take 1 tablet (250 mg total) by mouth daily. 1 tablet daily x 1 month and stop Micki Riley, MD  Active   lisinopril (ZESTRIL) 2.5 MG tablet [id number]  Take 1 tablet (2.5 mg total) by mouth daily. Corwin Levins, MD  Active   nitroGLYCERIN (NITROSTAT) 0.4 MG SL tablet [id number]  PLACE 1 TABLET UNDER TONGUE EVERY 5 MINUTES AS NEEDED FOR CHEST PAIN. IF NO RELIEF AFTER 3 DOSES, CALL 911 Tonny Bollman, MD  Active   PHENobarbital (LUMINAL) 32.4 MG tablet [id number]  Take 1 tablet (32.4 mg total) by mouth 4 (four) times daily. NO refills without a revisit with Dr Pearlean Brownie  . Micki Riley, MD  Active   pioglitazone (ACTOS) 45 MG tablet [id number]  Take 1 tablet (45 mg total) by mouth daily.  Patient not taking: Reported on 01/19/2024   Corwin Levins, MD  Active   rosuvastatin (CRESTOR) 40 MG tablet [id number]  TAKE 1 TABLET BY MOUTH EVERY DAY  Patient not taking: Reported on 01/19/2024   Corwin Levins, MD  Active   tamsulosin [facility]) 0.4 MG CAPS capsule [id number]  Take 0.4 mg by mouth daily. [provider]  Active               Assessment/Plan:   Diabetes: - Currently uncontrolled, A1c goal <7% - Reviewed long term cardiovascular and renal outcomes of uncontrolled blood sugar - Reviewed goal A1c, goal fasting - Reviewed dietary modifications including decreasing high carb/sugar foods and beverages, balanced meals/snacks - Recommend to begin checking BG once daily x 1 week and keep a log to determine best treatment approach moving forward - Continue  glipizide    Follow Up Plan: 4/17  Arbutus Leas, PharmD, BCPS, CPP Clinical Pharmacist Practitioner [place] [facility] at [facility] [phone]

## 2024-01-29 NOTE — Patient Instructions (Signed)
 It was a pleasure speaking with you today!  Continue taking glipizide for blood sugars. Check your blood sugar daily and keep a log for [place] to review when I call back in 1 week.  Feel free to call with any questions or concerns!  Arbutus Leas, PharmD, BCPS, CPP Clinical Pharmacist Practitioner [place] [facility] at [facility] [phone]

## 2024-02-05 ENCOUNTER — Other Ambulatory Visit (INDEPENDENT_AMBULATORY_CARE_PROVIDER_SITE_OTHER): Admitting: Pharmacist

## 2024-02-05 DIAGNOSIS — E1165 Type 2 diabetes mellitus with hyperglycemia: Secondary | ICD-10-CM

## 2024-02-05 MED ORDER — ACCU-CHEK SOFTCLIX LANCETS MISC: 12 refills | Status: DC

## 2024-02-05 NOTE — Progress Notes (Signed)
 02/05/2024 Name: [name] MRN: [id number] DOB: 06-12-1961  Chief Complaint  Patient presents with   Diabetes   Medication Management    [name] is a 63 y.o. year old male who presented for a telephone visit.   They were referred to the pharmacist by their PCP for assistance in managing diabetes.   Subjective:  Care Team: Primary Care Provider: Roslyn Coombe, MD ; Next Scheduled Visit: 03/10/24  Medication Access/Adherence  Current Pharmacy:  [id number] - [place], [place] - [address] AT [facility] OF [address] & [address] [facility] [address] Phone: [phone] Fax: [phone]   Patient reports affordability concerns with their medications: No  Patient reports access/transportation concerns to their pharmacy: No  Patient reports adherence concerns with their medications:  Yes    Pt is not taking glipizide , pioglitazone . Is not taking rosuvastatin  or ezetimibe  due to pharmacy filling these prescriptions incorrectly causing him to take double the rosuvastatin  dose.  Diabetes:  Current medications: glipizide  XL 10 mg daily (started within the last week) Cannot take metformin  due to GI upset.  Notes he feels much better than he did after taking double dose of the rosuvastatin  due to pharmacy error, per patient report. Pt believes the rosuvastatin  double dose is what caused BG to be so elevated.  Received glucometer. Fasting BG on Tuesday 259, BG yesterday 217, This morning 210  He notes he has tolerated glipizide  well. He notes he won't be taking pioglitazone  due to side effects including increased risk of heart failure, cancer risk, edema, etc  Current meal patterns: Only eats once per day. Eats [place] food often Has been eating: [place] yogurt, banana, papaya, Healthy Choice meals Drinks water, 100% cranberry juice, Brisk tea    Objective:  Lab Results  Component Value Date   HGBA1C >14.0 (H) 12/12/2023     Lab Results  Component Value Date   CREATININE 1.44 12/12/2023   BUN 18 12/12/2023   NA 133 (L) 12/12/2023   K 4.9 12/12/2023   CL 92 (L) 12/12/2023   CO2 23 12/12/2023    Lab Results  Component Value Date   CHOL 354 (H) 12/12/2023   HDL 39.80 12/12/2023   LDLCALC 245 (H) 12/12/2023   TRIG 345.0 (H) 12/12/2023   CHOLHDL 9 12/12/2023    Medications Reviewed Today     Reviewed by Dion Frankel, RPH (Pharmacist) on 02/05/24 at 1649  Med List Status: <None>   Medication Order Taking? Sig Documenting Provider Last Dose Status Informant  Accu-Chek Softclix Lancets lancets [id number] Yes Check blood sugar up to 3x per day Dx: 11.65 Roslyn Coombe, MD  Active   albuterol  (VENTOLIN  HFA) 108 [phone] Base) MCG/ACT inhaler [id number]  INHALE 2 PUFFS INTO THE LUNGS EVERY 6 HOURS AS NEEDED FOR WHEEZING OR SHORTNESS OF Refugia Canton, MD  Active   Blood Glucose Monitoring Suppl DEVI [id number]  1 each by Does not apply route daily. May substitute to any manufacturer covered by patient's insurance. Dx E11.65 Roslyn Coombe, MD  Active   carvedilol  (COREG ) 3.125 MG tablet [id number]  Take 1 tablet (3.125 mg total) by mouth 2 (two) times daily with a meal. Roslyn Coombe, MD  Active   Cholecalciferol (VITAMIN D3) 50 MCG (2000 UT) capsule [id number]  Take 1 capsule (2,000 Units total) by mouth daily. Roslyn Coombe, MD  Active Self  clopidogrel  (PLAVIX ) 75 MG tablet [id number]  TAKE 1  TABLET(75 MG) BY MOUTH DAILY Swinyer, Leilani Punter, NP  Active   ezetimibe  (ZETIA ) 10 MG tablet [id number]  Take 1 tablet (10 mg total) by mouth daily.  Patient not taking: Reported on 01/19/2024   Roslyn Coombe, MD  Active   furosemide  (LASIX ) 40 MG tablet [id number]  TAKE 1 TABLET BY MOUTH EVERY DAY AS NEEDED FOR EDEMA(TAKE FOR WEIGHT GAIN OVER 3 POUNDS IN 1 DAY OR LEG SWELLING) Roslyn Coombe, MD  Active Self  glipiZIDE  (GLUCOTROL  XL) 10 MG 24 hr tablet [id number] Yes Take 1 tablet (10 mg total) by mouth daily with  breakfast. Roslyn Coombe, MD Taking Active   Glucose Blood (BLOOD GLUCOSE TEST STRIPS) STRP [id number]  1 each by In Vitro route daily. May substitute to any manufacturer covered by patient's insurance.Dx E11.65 Roslyn Coombe, MD  Active   isosorbide  mononitrate (IMDUR ) 30 MG 24 hr tablet [id number]  TAKE 1/2 TABLET BY MOUTH DAILY Roslyn Coombe, MD  Active   Lancet Device MISC [id number]  1 each by Does not apply route daily. May substitute to any manufacturer covered by patient's insurance. Dx E11.65 Roslyn Coombe, MD  Active   Lancets Misc. MISC [id number]  1 each by Does not apply route daily. May substitute to any manufacturer covered by patient's insurance. Dx E11.65 Roslyn Coombe, MD  Active   levETIRAcetam  (KEPPRA ) 250 MG tablet [id number]  Take 1 tablet (250 mg total) by mouth daily. 1 tablet daily x 1 month and stop Sethi, Pramod S, MD  Active   lisinopril  (ZESTRIL ) 2.5 MG tablet [id number]  Take 1 tablet (2.5 mg total) by mouth daily. Roslyn Coombe, MD  Active   nitroGLYCERIN  (NITROSTAT ) 0.4 MG SL tablet [id number]  PLACE 1 TABLET UNDER TONGUE EVERY 5 MINUTES AS NEEDED FOR CHEST PAIN. IF NO RELIEF AFTER 3 DOSES, CALL 911 Arnoldo Lapping, MD  Active   PHENobarbital  (LUMINAL) 32.4 MG tablet [id number]  Take 1 tablet (32.4 mg total) by mouth 4 (four) times daily. NO refills without a revisit with Dr Janett Medin  . Sethi, Pramod S, MD  Active   rosuvastatin  (CRESTOR ) 40 MG tablet [id number]  TAKE 1 TABLET BY MOUTH EVERY DAY  Patient not taking: Reported on 01/19/2024   Roslyn Coombe, MD  Active   tamsulosin (FLOMAX) 0.4 MG CAPS capsule [id number]  Take 0.4 mg by mouth daily. [provider]  Active               Assessment/Plan:   Diabetes: - Currently uncontrolled, A1c goal <7% - Reviewed long term cardiovascular and renal outcomes of uncontrolled blood sugar - Reviewed goal A1c, goal fasting - Reviewed dietary modifications including decreasing high carb/sugar foods and beverages,  balanced meals/snacks - Recommend to continue checking BG once daily x 1 week and keep a log to determine best treatment approach moving forward - Increase glipizide  10 mg 2 tablets daily - He will come for OV on 5/1 to apply a sample CGM sensor - Recommended Ozempic however pt is resistant to using an injectable medication. Explained seriousness of how elevated his BG are and currently, GLP1 agonist or insulin  are the only choices left for medications    Follow Up Plan:5/1  Rainelle Bur, PharmD, BCPS, CPP Clinical Pharmacist Practitioner [facility] [phone]

## 2024-02-06 MED ORDER — GLIPIZIDE ER 10 MG PO TB24
20.0000 mg | ORAL_TABLET | Freq: Every day | ORAL | 0 refills | Status: DC
Start: 2024-02-06 — End: 2024-02-19

## 2024-02-06 NOTE — Patient Instructions (Signed)
 It was a pleasure speaking with you today!  Take glipizide  10 mg 2 tablets daily.  Reduce high sugar beverages and eat at least 2 balanced meals per day.  Continue checking blood sugar once per day  Feel free to call with any questions or concerns!  Rainelle Bur, PharmD, BCPS, CPP Clinical Pharmacist Practitioner [facility] [phone]

## 2024-02-19 ENCOUNTER — Ambulatory Visit

## 2024-02-19 ENCOUNTER — Other Ambulatory Visit (INDEPENDENT_AMBULATORY_CARE_PROVIDER_SITE_OTHER): Payer: Self-pay | Admitting: Pharmacist

## 2024-02-19 DIAGNOSIS — E1165 Type 2 diabetes mellitus with hyperglycemia: Secondary | ICD-10-CM

## 2024-02-19 MED ORDER — GLIPIZIDE ER 10 MG PO TB24: 10.0000 mg | ORAL_TABLET | Freq: Every day | ORAL | Status: DC

## 2024-02-19 NOTE — Progress Notes (Signed)
02/19/2024 Name: [name] MRN: [id number] DOB: December 24, 1960  Chief Complaint  Patient presents with   Diabetes   Medication Management    [name] is a 63 y.o. year old male who presented for a telephone visit.   They were referred to the pharmacist by their PCP for assistance in managing diabetes.   Subjective:  Care Team: Primary Care Provider: Roslyn Coombe, MD ; Next Scheduled Visit: 03/10/24  Medication Access/Adherence  Current Pharmacy:  [id number] - [place], [place] - [address] AT [facility] [address] & [address] [facility] [address] Phone: [phone] Fax: [phone]   Patient reports affordability concerns with their medications: No  Patient reports access/transportation concerns to their pharmacy: No  Patient reports adherence concerns with their medications:  Yes    Pt is not taking glipizide , pioglitazone . Is not taking rosuvastatin  or ezetimibe  due to pharmacy filling these prescriptions incorrectly causing him to take double the rosuvastatin  dose.  Diabetes:  Current medications: glipizide  XL 10 mg daily (started ~2 weeks ago) Cannot take metformin  due to GI upset.  Notes he feels much better than he did after taking double dose of the rosuvastatin  due to pharmacy error, per patient report. Pt believes the rosuvastatin  double dose is what caused BG to be so elevated.  Received glucometer. Home BG readings: 4/21 AM 122 4/22 AM 90, PM 128 4/23 AM 120, PM 114 4/24 AM 102 4/25 AM 76 BG the week prior were in the 200s  He increased glipizide  to 20 mg for one day and then noted his BG started to decrease so he has been taking just one 10 mg tablet daily since Pt notes he did not want to come to apply CGM sensor sample  He notes he won't be taking pioglitazone  due to side effects including increased risk of heart failure, cancer risk, edema, etc  Current meal patterns: Has started eating twice  per day instead of once Has been eating: [place] yogurt, banana, papaya, Healthy Choice meals Drinks water, 100% cranberry juice, Brisk tea    Objective:  Lab Results  Component Value Date   HGBA1C >14.0 (H) 12/12/2023    Lab Results  Component Value Date   CREATININE 1.44 12/12/2023   BUN 18 12/12/2023   NA 133 (L) 12/12/2023   K 4.9 12/12/2023   CL 92 (L) 12/12/2023   CO2 23 12/12/2023    Lab Results  Component Value Date   CHOL 354 (H) 12/12/2023   HDL 39.80 12/12/2023   LDLCALC 245 (H) 12/12/2023   TRIG 345.0 (H) 12/12/2023   CHOLHDL 9 12/12/2023    Medications Reviewed Today   Medications were not reviewed in this encounter       Assessment/Plan:   Diabetes: - Currently uncontrolled, A1c goal <7% - Reviewed long term cardiovascular and renal outcomes of uncontrolled blood sugar - Reviewed goal A1c, goal fasting - Reviewed dietary modifications including decreasing high carb/sugar foods and beverages, balanced meals/snacks - Recommend to continue checking BG at least 3x per week - Continue glipizide  10 mg once daily  - Recommended Ozempic however pt is resistant to using an injectable medication. Explained seriousness of how elevated his BG are and currently, GLP1 agonist or insulin  are the only choices left for medications    Follow Up Plan: f/u after 5/21 PCP visit  Rainelle Bur, PharmD, BCPS, CPP Clinical Pharmacist Practitioner [place] [facility]  [phone]    

## 2024-02-19 NOTE — Patient Instructions (Signed)
 It was a pleasure speaking with you today!  Continue glipizide  10 mg daily. Check blood sugars at least 3x per week.  Feel free to call with any questions or concerns!  Rainelle Bur, PharmD, BCPS, CPP Clinical Pharmacist Practitioner [place] [facility] [phone]

## 2024-03-04 ENCOUNTER — Telehealth: Payer: Self-pay | Admitting: *Deleted

## 2024-03-04 ENCOUNTER — Encounter: Payer: Self-pay | Admitting: Neurology

## 2024-03-04 ENCOUNTER — Ambulatory Visit: Payer: 59 | Admitting: Neurology

## 2024-03-04 VITALS — BP 103/61 | HR 75 | Ht 74.0 in | Wt 197.0 lb

## 2024-03-04 DIAGNOSIS — E785 Hyperlipidemia, unspecified: Secondary | ICD-10-CM

## 2024-03-04 DIAGNOSIS — E782 Mixed hyperlipidemia: Secondary | ICD-10-CM

## 2024-03-04 DIAGNOSIS — G40909 Epilepsy, unspecified, not intractable, without status epilepticus: Secondary | ICD-10-CM

## 2024-03-04 DIAGNOSIS — Z79899 Other long term (current) drug therapy: Secondary | ICD-10-CM

## 2024-03-04 MED ORDER — PHENOBARBITAL 32.4 MG PO TABS: 32.4000 mg | ORAL_TABLET | Freq: Four times a day (QID) | ORAL | 5 refills | Status: DC

## 2024-03-04 NOTE — Patient Instructions (Signed)
 I had a long discussion with the patient regarding his remote seizures and has done extremely well and has been seizure-free since October 2015.  I recommend we discontinue Keppra  now and continue phenobarbital  and the current dosage of 32.4 mg 4 times a day.  He was given a refill for phenobarbital  for a year.  He was advised to avoid seizure provoking stimuli like abrupt medication discontinuation, skin deprivation, irregular eating and sleeping habits and extremes of exertion and alcohol and stimulants.  He will return for follow-up in the future in a year or call earlier if necessary.

## 2024-03-04 NOTE — Telephone Encounter (Signed)
 Pt walked in to office today because he never heard back from Dr. Arlester Ladd 3/10 telephone note.  Aware that I am not sure there is anything more than needed to be done r/t to this.  Aware that Dr. Arlester Ladd did see the note. Pt states he has not take Rosuvastatin  since December.  Confirms he is taking the Zetia . Forwarding to Dr. Arlester Ladd to review March telephone call again.  Pt aware office will update him once Dr. Arlester Ladd responds
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
## 2024-03-04 NOTE — Telephone Encounter (Signed)
 I would recommend that he go back on rosuvastatin  40 mg daily. I have reviewed his chart and his biggest medical issue of late appears to be uncontrolled diabetes.  This should be managed by his primary care physician.  I would like him to continue on ezetimibe  and he should have a follow-up metabolic panel and lipids done in about 4 to 6 weeks.  He has an appt scheduled with Dr Autry Legions next week. He should keep scheduled cardiology follow-up and I'm happy to see him back sooner if any new cardiac symptoms or issues arise. thanks

## 2024-03-08 NOTE — Telephone Encounter (Signed)
 Called and spoke with patient to review Dr Katheryne Pane comments. Pt verbalized understanding. States he doesn't need refills on Crestor  or zetia -he will restart. Will call us  with any further issues. Labs entered and released at this time.

## 2024-03-10 ENCOUNTER — Ambulatory Visit: Payer: 59 | Admitting: Internal Medicine

## 2024-03-23 ENCOUNTER — Other Ambulatory Visit: Payer: Self-pay | Admitting: Neurology

## 2024-03-26 NOTE — Telephone Encounter (Signed)
 Meds ordered this encounter  Medications   PHENobarbital  (LUMINAL) 32.4 MG tablet    Sig: TAKE 1 TABLET BY MOUTH FOUR TIMES DAILY    Dispense:  360 tablet    Refill:  3

## 2024-04-01 DIAGNOSIS — D631 Anemia in chronic kidney disease: Secondary | ICD-10-CM | POA: Diagnosis not present

## 2024-04-01 DIAGNOSIS — E1122 Type 2 diabetes mellitus with diabetic chronic kidney disease: Secondary | ICD-10-CM | POA: Diagnosis not present

## 2024-04-01 DIAGNOSIS — N1831 Chronic kidney disease, stage 3a: Secondary | ICD-10-CM | POA: Diagnosis not present

## 2024-04-01 DIAGNOSIS — N2581 Secondary hyperparathyroidism of renal origin: Secondary | ICD-10-CM | POA: Diagnosis not present

## 2024-04-01 DIAGNOSIS — E785 Hyperlipidemia, unspecified: Secondary | ICD-10-CM | POA: Diagnosis not present

## 2024-04-01 DIAGNOSIS — I129 Hypertensive chronic kidney disease with stage 1 through stage 4 chronic kidney disease, or unspecified chronic kidney disease: Secondary | ICD-10-CM | POA: Diagnosis not present

## 2024-04-01 DIAGNOSIS — N189 Chronic kidney disease, unspecified: Secondary | ICD-10-CM | POA: Diagnosis not present

## 2024-04-01 DIAGNOSIS — E1129 Type 2 diabetes mellitus with other diabetic kidney complication: Secondary | ICD-10-CM | POA: Diagnosis not present

## 2024-04-10 ENCOUNTER — Other Ambulatory Visit: Payer: Self-pay | Admitting: Neurology

## 2024-04-16 ENCOUNTER — Telehealth: Payer: Self-pay | Admitting: Cardiovascular Disease

## 2024-04-16 DIAGNOSIS — Z79899 Other long term (current) drug therapy: Secondary | ICD-10-CM | POA: Diagnosis not present

## 2024-04-16 DIAGNOSIS — E785 Hyperlipidemia, unspecified: Secondary | ICD-10-CM | POA: Diagnosis not present

## 2024-04-16 DIAGNOSIS — E782 Mixed hyperlipidemia: Secondary | ICD-10-CM | POA: Diagnosis not present

## 2024-04-16 NOTE — Telephone Encounter (Signed)
 Please reach out to this patient as he walked in this afternoon stating he needs to get his handicapped placard renewed.

## 2024-04-17 ENCOUNTER — Ambulatory Visit: Payer: Self-pay | Admitting: Cardiovascular Disease

## 2024-04-17 LAB — BASIC METABOLIC PANEL WITH GFR
BUN/Creatinine Ratio: 11 (ref 10–24)
BUN: 15 mg/dL (ref 8–27)
CO2: 20 mmol/L (ref 20–29)
Calcium: 9.1 mg/dL (ref 8.6–10.2)
Chloride: 104 mmol/L (ref 96–106)
Creatinine, Ser: 1.42 mg/dL — ABNORMAL HIGH (ref 0.76–1.27)
Glucose: 93 mg/dL (ref 70–99)
Potassium: 4.8 mmol/L (ref 3.5–5.2)
Sodium: 138 mmol/L (ref 134–144)
eGFR: 56 mL/min/{1.73_m2} — ABNORMAL LOW (ref >59)

## 2024-04-17 LAB — LIPID PANEL
Chol/HDL Ratio: 3.6 ratio (ref 0.0–5.0)
Cholesterol, Total: 135 mg/dL (ref 100–199)
HDL: 38 mg/dL — ABNORMAL LOW (ref >39)
LDL Chol Calc (NIH): 79 mg/dL (ref 0–99)
Triglycerides: 96 mg/dL (ref 0–149)
VLDL Cholesterol Cal: 18 mg/dL (ref 5–40)

## 2024-04-19 ENCOUNTER — Other Ambulatory Visit: Payer: Self-pay | Admitting: Internal Medicine

## 2024-04-19 DIAGNOSIS — E1165 Type 2 diabetes mellitus with hyperglycemia: Secondary | ICD-10-CM

## 2024-04-19 NOTE — Telephone Encounter (Signed)
 This is fine. He has significant cardiac limitation. I'm willing to sign form.

## 2024-04-20 ENCOUNTER — Other Ambulatory Visit: Payer: Self-pay

## 2024-04-21 NOTE — Telephone Encounter (Signed)
 Called and spoke with patient to let him know that handicapped placard is signed and ready for him to pick up. He will come to coumadin clinic desk tomorrow to pick up.

## 2024-04-22 NOTE — Telephone Encounter (Signed)
 Last seen on 03/04/24 per note  I recommend we discontinue Keppra  now and continue phenobarbital  and the current dosage of 32.4 mg 4 times a day.   Follow up scheduled on 03/15/25

## 2024-04-28 ENCOUNTER — Telehealth: Payer: Self-pay | Admitting: Pharmacy Technician

## 2024-04-28 NOTE — Progress Notes (Signed)
   04/28/2024  Patient ID: [name], male   DOB: 02/13/1961, 63 y.o.   MRN: [id number]  Patient engaged with clinical pharmacist for management of diabetes on 02/19/2024. Outreach by [employer] technician was requested.   Outreached patient to discuss diabetes medication management. Left voicemail for patient to return my call at their convenience.   Phelix Fudala, CPhT [facility] Office: [phone] Email: [email or web address]

## 2024-05-03 ENCOUNTER — Telehealth: Payer: Self-pay | Admitting: Pharmacy Technician

## 2024-05-03 NOTE — Progress Notes (Addendum)
    05/03/2024 Name: [name] MRN: [id number] DOB: [birth year]  Patient is appearing on a report for [employer] [place] [employer] Diabetes and last engaged with the clinical pharmacist to discuss diabetes on 02/19/2024. Contacted patient today to discuss diabetes management and completed medication review.   Diabetes Plan from last clinical pharmacist appointment:  Diabetes: - Currently uncontrolled, A1c goal <7% - Reviewed long term cardiovascular and renal outcomes of uncontrolled blood sugar - Reviewed goal A1c, goal fasting - Reviewed dietary modifications including decreasing high carb/sugar foods and beverages, balanced meals/snacks - Recommend to continue checking BG at least 3x per week - Continue glipizide  10 mg once daily   - Recommended Ozempic however pt is resistant to using an injectable medication. Explained seriousness of how elevated his BG are and currently, GLP1 agonist or insulin  are the only choices left for medications Follow Up Plan: f/u after 5/21 PCP visit   Medication Adherence Barriers Identified:  Patient made recommended medication changes per plan: No Patient was no show to 5/21/ PCP Visit. Patient informs he is taking 2 Glipizide  xl 10mg  per day. Access issues with any new medication or testing device: No Per Dr Annemarie, patient last received a 90 day supply of Glipizide  on 02/15/24  Patient is checking blood sugars as prescribed: No Patient is checking blood sugar once a week. He informs last week he checked it around midnight and it was in the 60s, he thinks around 10. He informs he was not having any symptoms of low blood sugar at the time. He informs he did eat something but did not recheck it. He attributes the low blood sugar to only having 1 meal that day. He informs the only thing he wanted was some water.  He informs when he was taking 1 Glipizide  his blood sugar was in the upper 130s and since he has been taking 2 Glipizide  it is now in the 60s. Patient informs he  feels as though he should be able to drink cranberry juice and have the occasional banana pudding since his blood sugars are now in the 60s. Will send request to care guide to schedule a follow up with PharmD. Advised patient to call and schedule a f/u with Dr.John. Patient informs the provider's office generally calls him if he needs an appointment. He would prefers afternoon appointments around 2-3pm.  Reviewed instructions for monitoring blood sugars at home and reminded patient to keep a written log to review with pharmacist Reminded patient of date/time of upcoming clinical pharmacist follow up and any upcoming PCP/specialists visits. Patient denies transportation barriers to the appointment. Yes  Next clinical pharmacist appointment is scheduled for: TBD  Kate Caddy, CPhT [facility] Office: [phone] Email: [email or web address]

## 2024-05-10 ENCOUNTER — Other Ambulatory Visit: Payer: Self-pay | Admitting: Internal Medicine

## 2024-05-10 DIAGNOSIS — E1165 Type 2 diabetes mellitus with hyperglycemia: Secondary | ICD-10-CM

## 2024-05-12 ENCOUNTER — Telehealth: Payer: Self-pay | Admitting: Pharmacist

## 2024-05-12 NOTE — Progress Notes (Signed)
 Pharmacy Quality Measure Review  This patient is appearing on a report for being at risk of failing the adherence measure for cholesterol (statin) medications this calendar year.   Medication: Rosuvastatin  40 mg Last fill date: 04/13/2024 for 90 day supply  Insurance report was not up to date. No action needed at this time.   Darrelyn Drum, PharmD, BCPS, CPP Clinical Pharmacist Practitioner [facility] [phone]

## 2024-05-18 ENCOUNTER — Telehealth: Payer: Self-pay | Admitting: *Deleted

## 2024-05-18 NOTE — Progress Notes (Signed)
 Complex Care Management Care Guide Note  05/18/2024 Name: [name] MRN: [id number] DOB: 1960-12-11  [name] is a 63 y.o. year old male who is a primary care patient of Norleen Lynwood ORN, MD and is actively engaged with the care management team. I reached out to Jandiel E Aguinaldo by phone today to assist with re-scheduling  with the Pharmacist.  Follow up plan: Unsuccessful telephone outreach attempt made. A HIPAA compliant phone message was left for the patient providing contact information and requesting a return call. No further outreach attempts will be made due to inability to maintain patient contact.   Thedford Franks, CMA [place]  [facility] Guide Direct Dial: [phone]  Fax: [phone] Website: [email or web address]

## 2024-07-16 ENCOUNTER — Other Ambulatory Visit: Payer: Self-pay | Admitting: Internal Medicine

## 2024-07-16 DIAGNOSIS — E1165 Type 2 diabetes mellitus with hyperglycemia: Secondary | ICD-10-CM

## 2024-07-22 ENCOUNTER — Encounter: Payer: Self-pay | Admitting: Internal Medicine

## 2024-07-22 NOTE — Progress Notes (Signed)
 Pharmacy Quality Measure Review  This patient is appearing on a report for being at risk of failing the adherence measure for hypertension (ACEi/ARB) medications this calendar year.   Medication: Lisinopril  2.5mg  Last fill date: 09/15 for 90 day supply  Insurance report was not up to date. No action needed at this time.   Angela Baalmann, PharmD [facility] Pharmacist

## 2024-08-06 ENCOUNTER — Other Ambulatory Visit: Payer: Self-pay | Admitting: Internal Medicine

## 2024-08-06 ENCOUNTER — Other Ambulatory Visit: Payer: Self-pay

## 2024-08-06 DIAGNOSIS — E1165 Type 2 diabetes mellitus with hyperglycemia: Secondary | ICD-10-CM

## 2024-08-16 ENCOUNTER — Other Ambulatory Visit (HOSPITAL_COMMUNITY): Payer: Self-pay

## 2024-08-24 ENCOUNTER — Emergency Department (HOSPITAL_COMMUNITY)
Admission: EM | Admit: 2024-08-24 | Discharge: 2024-08-24 | Discharge status: Against Medical Advice | Attending: Emergency Medicine | Admitting: Emergency Medicine

## 2024-08-24 ENCOUNTER — Encounter (HOSPITAL_COMMUNITY): Payer: Self-pay | Admitting: Emergency Medicine

## 2024-08-24 ENCOUNTER — Other Ambulatory Visit: Payer: Self-pay

## 2024-08-24 ENCOUNTER — Emergency Department (HOSPITAL_COMMUNITY)

## 2024-08-24 DIAGNOSIS — Z5329 Procedure and treatment not carried out because of patient's decision for other reasons: Secondary | ICD-10-CM | POA: Diagnosis not present

## 2024-08-24 DIAGNOSIS — R079 Chest pain, unspecified: Secondary | ICD-10-CM | POA: Diagnosis present

## 2024-08-24 DIAGNOSIS — I509 Heart failure, unspecified: Secondary | ICD-10-CM | POA: Diagnosis not present

## 2024-08-24 DIAGNOSIS — R0602 Shortness of breath: Secondary | ICD-10-CM | POA: Diagnosis not present

## 2024-08-24 DIAGNOSIS — Z7902 Long term (current) use of antithrombotics/antiplatelets: Secondary | ICD-10-CM | POA: Diagnosis not present

## 2024-08-24 DIAGNOSIS — R61 Generalized hyperhidrosis: Secondary | ICD-10-CM | POA: Insufficient documentation

## 2024-08-24 DIAGNOSIS — I2510 Atherosclerotic heart disease of native coronary artery without angina pectoris: Secondary | ICD-10-CM | POA: Insufficient documentation

## 2024-08-24 DIAGNOSIS — R7989 Other specified abnormal findings of blood chemistry: Secondary | ICD-10-CM | POA: Diagnosis not present

## 2024-08-24 LAB — BASIC METABOLIC PANEL WITH GFR
Anion gap: 9 (ref 5–15)
BUN: 20 mg/dL (ref 8–23)
CO2: 23 mmol/L (ref 22–32)
Calcium: 8.8 mg/dL — ABNORMAL LOW (ref 8.9–10.3)
Chloride: 107 mmol/L (ref 98–111)
Creatinine, Ser: 1.44 mg/dL — ABNORMAL HIGH (ref 0.61–1.24)
GFR, Estimated: 55 mL/min — ABNORMAL LOW (ref >60)
Glucose, Bld: 146 mg/dL — ABNORMAL HIGH (ref 70–99)
Potassium: 4.2 mmol/L (ref 3.5–5.1)
Sodium: 139 mmol/L (ref 135–145)

## 2024-08-24 LAB — CBC
HCT: 27.7 % — ABNORMAL LOW (ref 39.0–52.0)
Hemoglobin: 8.5 g/dL — ABNORMAL LOW (ref 13.0–17.0)
MCH: 28.3 pg (ref 26.0–34.0)
MCHC: 30.7 g/dL (ref 30.0–36.0)
MCV: 92.3 fL (ref 80.0–100.0)
Platelets: 457 K/uL — ABNORMAL HIGH (ref 150–400)
RBC: 3 MIL/uL — ABNORMAL LOW (ref 4.22–5.81)
RDW: 18.1 % — ABNORMAL HIGH (ref 11.5–15.5)
WBC: 7.7 K/uL (ref 4.0–10.5)
nRBC: 0 % (ref 0.0–0.2)

## 2024-08-24 LAB — BRAIN NATRIURETIC PEPTIDE: B Natriuretic Peptide: 42.7 pg/mL (ref 0.0–100.0)

## 2024-08-24 LAB — TROPONIN I (HIGH SENSITIVITY): Troponin I (High Sensitivity): 14 ng/L (ref <18)

## 2024-08-24 NOTE — ED Triage Notes (Signed)
 Pt c.o chest pain, pt refused EMS transport, states he was told he was having a heart attack

## 2024-08-24 NOTE — ED Triage Notes (Signed)
 Patient reports centralized chest pain that radiates to his back. Reports EMS evaluated and found STEMI on EKG but patient refused transport at that time. Chest pain free at this time.

## 2024-08-24 NOTE — ED Notes (Signed)
 Pt reports wanting to leave after not seeing a doctor so long. PA and MD aware, PA came to bedside to talk with pt. Pt is aware of all risks and AMA form signed. IV taken out.

## 2024-08-24 NOTE — ED Provider Notes (Signed)
 [place] EMERGENCY DEPARTMENT AT [facility] Provider Note   CSN: [id number] Arrival date & time: 08/24/24  1859     Patient presents with: Chest Pain   [name] is a 63 y.o. male with a history of multiple previous MIs and heart failure, presents to the ED with chest pain that began a few hours ago.  The patient states that he had an episode of chest pain that was centralized and radiated down left arm and to his back, patient stated that he had associated shortness of breath and diaphoresis. The patient states that he was seen by EMS at the time of the episode who did a twelve-lead EKG on him and told him that he was possibly having a STEMI, he refused EMS transport and came to the emergency department POV.  Patient relates that EMS gave him aspirin  and nitroglycerin  which helped his pain. Patient stated at this time he is not experiencing any chest pain or shortness of breath, however wanted to come in to be evaluated.     Chest Pain      Prior to Admission medications   Medication Sig Start Date End Date Taking? Authorizing Provider  Accu-Chek FastClix Lancets MISC USE TO CHECK BLOOD GLUCOSE DAILY 04/20/24   Norleen Lynwood ORN, MD  ACCU-CHEK GUIDE TEST test strip USE ONCE DAILY TO CHECK BLOOD SUGAR 07/19/24   Norleen Lynwood ORN, MD  albuterol  (VENTOLIN  HFA) 108 [phone] Base) MCG/ACT inhaler INHALE 2 PUFFS INTO THE LUNGS EVERY 6 HOURS AS NEEDED FOR WHEEZING OR SHORTNESS OF BREATH 09/22/23   Norleen Lynwood ORN, MD  Blood Glucose Monitoring Suppl DEVI 1 each by Does not apply route daily. May substitute to any manufacturer covered by patient's insurance. Dx E11.65 01/19/24   Norleen Lynwood ORN, MD  carvedilol  (COREG ) 3.125 MG tablet Take 1 tablet (3.125 mg total) by mouth 2 (two) times daily with a meal. 12/14/23   Norleen Lynwood ORN, MD  Cholecalciferol (VITAMIN D3) 50 MCG (2000 UT) capsule Take 1 capsule (2,000 Units total) by mouth daily. 04/11/21   Norleen Lynwood ORN, MD  clopidogrel  (PLAVIX ) 75 MG tablet  TAKE 1 TABLET(75 MG) BY MOUTH DAILY 10/01/23   Swinyer, Rosaline HERO, NP  ezetimibe  (ZETIA ) 10 MG tablet Take 1 tablet (10 mg total) by mouth daily. 12/14/23   Norleen Lynwood ORN, MD  furosemide  (LASIX ) 40 MG tablet TAKE 1 TABLET BY MOUTH EVERY DAY AS NEEDED FOR EDEMA(TAKE FOR WEIGHT GAIN OVER 3 POUNDS IN 1 DAY OR LEG SWELLING) 04/11/21   Norleen Lynwood ORN, MD  glipiZIDE  (GLUCOTROL  XL) 10 MG 24 hr tablet TAKE 2 TABLETS(20 MG) BY MOUTH DAILY WITH BREAKFAST 08/06/24   Norleen Lynwood ORN, MD  isosorbide  mononitrate (IMDUR ) 30 MG 24 hr tablet TAKE 1/2 TABLET BY MOUTH DAILY 12/14/23   Norleen Lynwood ORN, MD  Lancets Misc. MISC 1 each by Does not apply route daily. May substitute to any manufacturer covered by patient's insurance. Dx E11.65 01/19/24   Norleen Lynwood ORN, MD  lisinopril  (ZESTRIL ) 2.5 MG tablet Take 1 tablet (2.5 mg total) by mouth daily. 12/14/23   Norleen Lynwood ORN, MD  nitroGLYCERIN  (NITROSTAT ) 0.4 MG SL tablet PLACE 1 TABLET UNDER TONGUE EVERY 5 MINUTES AS NEEDED FOR CHEST PAIN. IF NO RELIEF AFTER 3 DOSES, CALL 911 11/03/23   Wonda Sharper, MD  PHENobarbital  (LUMINAL) 32.4 MG tablet TAKE 1 TABLET BY MOUTH FOUR TIMES DAILY 03/26/24   Onita Duos, MD  rosuvastatin  (CRESTOR ) 40 MG tablet TAKE  1 TABLET BY MOUTH EVERY DAY 12/14/23   Norleen Lynwood ORN, MD  tamsulosin (FLOMAX) 0.4 MG CAPS capsule Take 0.4 mg by mouth daily. 05/26/23   [provider]    Allergies: Metformin  and related    Review of Systems  Cardiovascular:  Positive for chest pain.    Updated Vital Signs BP (!) 138/107   Pulse 95   Temp 98.8 F (37.1 C) (Oral)   Resp 17   Ht 6' 2 (1.88 m)   Wt 90.7 kg   SpO2 100%   BMI 25.68 kg/m   Physical Exam Vitals and nursing note reviewed.  Constitutional:      General: He is not in acute distress.    Appearance: Normal appearance.  HENT:     Head: Normocephalic and atraumatic.  Eyes:     Extraocular Movements: Extraocular movements intact.     Conjunctiva/sclera: Conjunctivae normal.     Pupils:  Pupils are equal, round, and reactive to light.  Cardiovascular:     Rate and Rhythm: Normal rate and regular rhythm.     Pulses: Normal pulses.  Pulmonary:     Effort: Pulmonary effort is normal. No respiratory distress.  Musculoskeletal:        General: Normal range of motion.     Cervical back: Normal range of motion.  Skin:    General: Skin is warm and dry.     Capillary Refill: Capillary refill takes less than 2 seconds.  Neurological:     General: No focal deficit present.     Mental Status: He is alert. Mental status is at baseline.  Psychiatric:        Mood and Affect: Mood normal.     (all labs ordered are listed, but only abnormal results are displayed) Labs Reviewed  BASIC METABOLIC PANEL WITH GFR - Abnormal; Notable for the following components:      Result Value   Glucose, Bld 146 (*)    Creatinine, Ser 1.44 (*)    Calcium  8.8 (*)    GFR, Estimated 55 (*)    All other components within normal limits  CBC - Abnormal; Notable for the following components:   RBC 3.00 (*)    Hemoglobin 8.5 (*)    HCT 27.7 (*)    RDW 18.1 (*)    Platelets 457 (*)    All other components within normal limits  BRAIN NATRIURETIC PEPTIDE  TROPONIN I (HIGH SENSITIVITY)  TROPONIN I (HIGH SENSITIVITY)    EKG: None  Radiology: [facility] Chest Port 1 View Result Date: 08/24/2024 CLINICAL DATA:  Chest pain. EXAM: PORTABLE CHEST 1 VIEW COMPARISON:  Chest radiograph dated 02/16/2023. FINDINGS: No focal consolidation, pleural effusion or pneumothorax. Mild cardiomegaly. No acute osseous pathology. IMPRESSION: 1. No active disease. 2. Mild cardiomegaly. Electronically Signed   By: Vanetta Chou M.D.   On: 08/24/2024 20:09     Procedures   Medications Ordered in the ED - No data to display  Clinical Course as of 08/24/24 2329  Tue Aug 24, 2024  1940 BP: 136/76 [ML]    Clinical Course User Index [ML] Willma Duwaine CROME, [place]                                 Medical Decision Making Amount  and/or Complexity of Data Reviewed Labs: ordered. Radiology: ordered.   Patient presents to the ED for concern of pain, this involves an extensive number of  treatment options, and is a complaint that carries with it a high risk of complications and morbidity.    The differential diagnosis includes: STEMI/NSTEMI Unstable angina  Co morbidities that complicate the patient evaluation: Heart failure Prior myocardial infarction CAD  Additional history obtained: Additional history obtained from Outside Medical Records and Past Admission  External records from outside source obtained and reviewed including medical history, surgical history, allergies, medications. The patient was a reliable historian, providing a clear, detailed, and consistent account of the presenting symptoms and relevant medical history. The information was obtained directly from the patient and statements were documented in the patient's own words when possible. No discrepancies were noted between the history provided and available collateral sources.     Lab Tests: I ordered, and personally interpreted labs.   The pertinent results include:  Troponin - negative, repeat troponin not complete CBC - hemoglobin 8.5 BMP - creatinine 1.44 BNP -no acute findings  Imaging Studies ordered: I ordered imaging studies including: DG chest I independently visualized and interpreted imaging which showed: Mild cardiomegaly I agree with the radiologist interpretation  Cardiac Monitoring: The patient was maintained on a cardiac monitor.  I personally viewed and interpreted the cardiac monitored which showed an underlying rhythm of: Sinus rhythm with notable LVH  Test Considered: Diagnostic testing was considered based on the patient's presenting symptoms, risk factors, and initial clinical assessment.  Given patient's extensive cardiac history, history of present illness, laboratory findings, additional imaging was considered  to rule out possible dissection or other vascular involvement, however patient decided to leave AMA before any additional testing could be completed or other laboratory testing resulted. The approach to diagnostic testing prioritized exclusion of life-threatening conditions  Problem List / ED Course: Problem List: Chest Pain Emergency Department Course: The patient presented with chest pain. Initial assessment included history, physical exam, and review of prior medical records. ECG and first troponin were ordered and reviewed; both were negative for acute ischemia. Chest X-ray was performed; no acute findings.  CBC, BMP, BNP were all evaluated and patient was found to have a hemoglobin of 8.5 - last hemoglobin of 12.5 eight months ago and chronically elevated creatinine.  Given patient's extensive history, history of present illness, laboratory findings, and imaging - plan was to assess patient further with additional imaging and laboratory studies, however, patient decided that he wanted to leave AMA before all laboratory test resulted. Dispostion: While additional laboratory studies were pending, patient decided he wanted to leave the emergency department AMA due to wait. I went and spoke with the patient and relayed to him that additional studies were pending and I requested that patient stay due to patient's extensive medical history and the possible need for further laboratory and imaging evaluation.  Patient adamantly denied staying and I fully educated patient on risks of leaving AMA up to including the possibility of death - patient stated that he understood his risks and still wanted to sign out AMA. Patient signed AMA form fully informed of risks.        Final diagnoses:  Chest pain, unspecified type    ED Discharge Orders     None          Willma Duwaine CROME, [place] 08/24/24 2330    Mannie Pac T, DO 08/25/24 2256

## 2024-09-01 LAB — LAB REPORT - SCANNED
Albumin, Urine POC: 8.1
Albumin/Creatinine Ratio, Urine, POC: 4
Creatinine, POC: 210.9 mg/dL
EGFR: 55

## 2024-09-02 ENCOUNTER — Ambulatory Visit: Admitting: Internal Medicine

## 2024-09-03 ENCOUNTER — Encounter: Payer: Self-pay | Admitting: Internal Medicine

## 2024-09-03 ENCOUNTER — Ambulatory Visit (INDEPENDENT_AMBULATORY_CARE_PROVIDER_SITE_OTHER): Admitting: Internal Medicine

## 2024-09-03 VITALS — BP 136/72 | HR 87 | Temp 98.6°F | Ht 74.0 in | Wt 230.0 lb

## 2024-09-03 DIAGNOSIS — E1165 Type 2 diabetes mellitus with hyperglycemia: Secondary | ICD-10-CM | POA: Diagnosis not present

## 2024-09-03 DIAGNOSIS — D638 Anemia in other chronic diseases classified elsewhere: Secondary | ICD-10-CM

## 2024-09-03 DIAGNOSIS — E559 Vitamin D deficiency, unspecified: Secondary | ICD-10-CM | POA: Diagnosis not present

## 2024-09-03 DIAGNOSIS — Z7984 Long term (current) use of oral hypoglycemic drugs: Secondary | ICD-10-CM | POA: Diagnosis not present

## 2024-09-03 DIAGNOSIS — I5020 Unspecified systolic (congestive) heart failure: Secondary | ICD-10-CM

## 2024-09-03 DIAGNOSIS — I10 Essential (primary) hypertension: Secondary | ICD-10-CM | POA: Diagnosis not present

## 2024-09-03 DIAGNOSIS — E7800 Pure hypercholesterolemia, unspecified: Secondary | ICD-10-CM

## 2024-09-03 MED ORDER — CARVEDILOL 3.125 MG PO TABS: 3.1250 mg | ORAL_TABLET | Freq: Two times a day (BID) | ORAL | 3 refills | Status: AC

## 2024-09-03 MED ORDER — ALBUTEROL SULFATE HFA 108 (90 BASE) MCG/ACT IN AERS: 2.0000 | INHALATION_SPRAY | Freq: Four times a day (QID) | RESPIRATORY_TRACT | 2 refills | Status: AC | PRN

## 2024-09-03 NOTE — Progress Notes (Signed)
 Patient ID: [name], male   DOB: 01-Aug-1961, 63 y.o.   MRN: [id number]        Chief Complaint: follow up HTN, HLD , severe uncontrolled DM, anemia, chf       HPI:  [name] is a 63 y.o. male here after ED visit nov 4 with chest pain now resolved it seems, but has had dizziness weakness and finding of new Hgb 8.5, down from baseline over 12 last done feb 2025.  Pt denies any overt bleeding. Renal function stable, glc 146  Troponin normal x 1,  BNP 42.   Pt left AMA from ED. Pt states has been out of coreg  for several wks, asks for refill, as well as albuterol   Pt denies chest pain, increased sob or doe, wheezing, orthopnea, PND, increased LE swelling, palpitations, dizziness or syncope.   Pt denies polydipsia, polyuria, or new focal neuro s/s.          Wt Readings from Last 3 Encounters:  09/03/24 230 lb (104.3 kg)  08/24/24 200 lb (90.7 kg)  03/04/24 197 lb (89.4 kg)   BP Readings from Last 3 Encounters:  09/03/24 136/72  08/24/24 (!) 138/107  03/04/24 103/61         Past Medical History:  Diagnosis Date   Anemia 01/01/2017   Ascending aorta dilation    Echocardiogram 2/22: EF 40-45, mild LVH, normal RVSF, mild to mod MR, mild to mod AI, ascending aorta 45 mm.     CAD (coronary artery disease)    a. Overlapping BMS-prox LCx in 2009 b. NSTEMI 2012 due to prox LCx ISR s/p PTCA alone; EF 45% w/ basal inf HK. Had run out of Plavix  1 week prior. c. NSTEMI 05/2015 after running out of Plavix  - cath with 3V CAD, Patient left AMA prior to agreeing to CABG. // d. LHC 8/16: LM 60; LAD mid 60, D1 75; RI 50, 80; LCx prox stent 100; RCA prox 100; EF 45-50 >> seen by [facility] - declined CABG >> Med Rx.   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   Hypertension    MI (myocardial infarction) (HCC) x 7   Poor compliance    Seizure disorder (HCC)    Seizures (HCC)    TOBACCO ABUSE 02/22/2009   Valvular heart disease    a. Echo 05/2015: mild AI, mod MR.   Past Surgical History:  Procedure Laterality Date   CARDIAC CATHETERIZATION [name] 05/29/2015   Procedure: Left Heart Cath and Coronary Angiography;  Surgeon: Victory LELON Sharps, MD;  Location: [facility] INVASIVE CV LAB;  Service: Cardiovascular;  Laterality: [name];   COLONOSCOPY     CORONARY ANGIOPLASTY  03/2011   PTCA-prox LCx ISR   CORONARY ANGIOPLASTY WITH STENT PLACEMENT  10/2007   BMS-prox LCx   LEFT HEART CATHETERIZATION WITH CORONARY ANGIOGRAM [name] 10/13/2013  Procedure: LEFT HEART CATHETERIZATION WITH CORONARY ANGIOGRAM;  Surgeon: Ozell JONETTA Fell, MD;  Location: [facility] CATH LAB;  Service: Cardiovascular;  Laterality: [name];   TEE WITHOUT CARDIOVERSION [name] 06/16/2015   Procedure: TRANSESOPHAGEAL ECHOCARDIOGRAM (TEE);  Surgeon: Maude JAYSON Emmer, MD;  Location: [facility] ENDOSCOPY;  Service: Cardiovascular;  Laterality: [name];    reports that he has quit smoking. His smoking use included cigars. He has never used smokeless tobacco. He reports that he does not drink alcohol and does not use drugs. family history includes Atrial fibrillation in his brother; Coronary artery disease in his maternal aunt; Heart attack in his maternal uncle; Heart disease in his maternal uncle; Hypertension in his brother and another family member; Stroke in his maternal uncle. Allergies  Allergen Reactions   Metformin  And Related Diarrhea    GI Upset (vomiting, diarrhea)   Current Outpatient Medications on File Prior to Visit  Medication Sig Dispense Refill    Accu-Chek FastClix Lancets MISC USE TO CHECK BLOOD GLUCOSE DAILY 102 each 0   ACCU-CHEK GUIDE TEST test strip USE ONCE DAILY TO CHECK BLOOD SUGAR 100 strip 0   Blood Glucose Monitoring Suppl DEVI 1 each by Does not apply route daily. May substitute to any manufacturer covered by patient's insurance. Dx E11.65 1 each 0   Cholecalciferol (VITAMIN D3) 50 MCG (2000 UT) capsule Take 1 capsule (2,000 Units total) by mouth daily. 90 capsule 99   clopidogrel  (PLAVIX ) 75 MG tablet TAKE 1 TABLET(75 MG) BY MOUTH DAILY 90 tablet 3   ezetimibe  (ZETIA ) 10 MG tablet Take 1 tablet (10 mg total) by mouth daily. 90 tablet 3   FERREX 150 150 MG capsule Take 150 mg by mouth daily.     furosemide  (LASIX ) 40 MG tablet TAKE 1 TABLET BY MOUTH EVERY DAY AS NEEDED FOR EDEMA(TAKE FOR WEIGHT GAIN OVER 3 POUNDS IN 1 DAY OR LEG SWELLING) 90 tablet 3   glipiZIDE  (GLUCOTROL  XL) 10 MG 24 hr tablet TAKE 2 TABLETS(20 MG) BY MOUTH DAILY WITH BREAKFAST 180 tablet 0   isosorbide  mononitrate (IMDUR ) 30 MG 24 hr tablet TAKE 1/2 TABLET BY MOUTH DAILY 45 tablet 3   Lancets Misc. MISC 1 each by Does not apply route daily. May substitute to any manufacturer covered by patient's insurance. Dx E11.65 100 each 0   lisinopril  (ZESTRIL ) 2.5 MG tablet Take 1 tablet (2.5 mg total) by mouth daily. 90 tablet 3   nitroGLYCERIN  (NITROSTAT ) 0.4 MG SL tablet PLACE 1 TABLET UNDER TONGUE EVERY 5 MINUTES AS NEEDED FOR CHEST PAIN. IF NO RELIEF AFTER 3 DOSES, CALL 911 25 tablet 10   PHENobarbital  (LUMINAL) 32.4 MG tablet TAKE 1 TABLET BY MOUTH FOUR TIMES DAILY 360 tablet 3   rosuvastatin  (CRESTOR ) 40 MG tablet TAKE 1 TABLET BY MOUTH EVERY DAY 90 tablet 3   tamsulosin (FLOMAX) 0.4 MG CAPS capsule Take 0.4 mg by mouth daily.     No current facility-administered medications on file prior to visit.        ROS:  All others reviewed and negative.  Objective        PE:  BP 136/72 (BP Location: Right Arm, Patient Position: Sitting, Cuff Size: Normal)   Pulse  87   Temp 98.6 F (37 C) (Oral)   Ht 6' 2 (1.88 m)   Wt 230 lb (104.3 kg)   SpO2 98%   BMI 29.53 kg/m                 Constitutional: Pt appears in  NAD               HENT: Head: NCAT.                Right Ear: External ear normal.                 Left Ear: External ear normal.                Eyes: . Pupils are equal, round, and reactive to light. Conjunctivae and EOM are normal               Nose: without d/c or deformity               Neck: Neck supple. Gross normal ROM               Cardiovascular: Normal rate and regular rhythm.                 Pulmonary/Chest: Effort normal and breath sounds without rales or wheezing.                Abd:  Soft, NT, ND, + BS, no organomegaly               Neurological: Pt is alert. At baseline orientation, motor grossly intact               Skin: Skin is warm. No rashes, no other new lesions, LE edema - none               Psychiatric: Pt behavior is normal without agitation   Micro: none  Cardiac tracings I have personally interpreted today:  none  Pertinent Radiological findings (summarize): none   Lab Results  Component Value Date   WBC 7.7 08/24/2024   HGB 8.5 (L) 08/24/2024   HCT 27.7 (L) 08/24/2024   PLT 457 (H) 08/24/2024   GLUCOSE 146 (H) 08/24/2024   CHOL 135 04/16/2024   TRIG 96 04/16/2024   HDL 38 (L) 04/16/2024   LDLCALC 79 04/16/2024   ALT 9 12/12/2023   AST 11 12/12/2023   NA 139 08/24/2024   K 4.2 08/24/2024   CL 107 08/24/2024   CREATININE 1.44 (H) 08/24/2024   BUN 20 08/24/2024   CO2 23 08/24/2024   TSH 1.71 12/12/2023   PSA 2.49 12/12/2023   INR 1.01 05/27/2015   HGBA1C >14.0 (H) 12/12/2023   MICROALBUR 2.0 (H) 12/12/2023   Assessment/Plan:  [name] is a 63 y.o. Black or African American [2] male with  has a past medical history of Anemia (01/01/2017), Ascending aorta dilation, CAD (coronary artery disease), Clotting disorder, Diabetes mellitus without complication (HCC), HFmrEF (heart failure with  mildy reduced EF) (06/06/2015), HLD (hyperlipidemia), Hyperglycemia (A1C 6.0 05/2015.), Hypertension, MI (myocardial infarction) (HCC) (x 7), Poor compliance, Seizure disorder (HCC), Seizures (HCC), TOBACCO ABUSE (02/22/2009), and Valvular heart disease.  Vitamin D  deficiency Last vitamin D  Lab Results  Component Value Date   VD25OH 16.73 (L) 12/12/2023   Low, to start oral replacement   Uncontrolled type 2 diabetes mellitus with hyperglycemia (HCC) With hyperglycemia,  Lab Results  Component Value Date   HGBA1C >14.0 (H) 12/12/2023   Severe uncontrolled, , pt to continue current medical treatment glipziide xl 10 every day f/u Lab today, pt continues to decline endo f/u   Pure hypercholesterolemia Lab Results  Component Value Date   LDLCALC 79 04/16/2024   uncontrolled pt to continue current statin crestor  40 mg every day, zetia  10  every day and f/u lab today, for improved dM diet, declines nutrition referral   Essential hypertension BP Readings from Last 3 Encounters:  09/03/24 136/72  08/24/24 (!) 138/107  03/04/24 103/61   Stable, pt to continue medical treatment lisinopril  2.5 every day, coreg  3.125 bid   Anemia due to chronic illness No overt bleeding, but with recent dizziness weakness - for f/u cbc and iron  lab,  to f/u any worsening symptoms or concerns  Followup: Return in about 6 months (around 03/03/2025).  Lynwood Rush, MD 09/04/2024 3:15 PM [facility] [place] [facility] Internal Medicine

## 2024-09-03 NOTE — Patient Instructions (Signed)
 Please continue all other medications as before, and refills have been done for the carvedilol  and albuterol   Please have the pharmacy call with any other refills you may need.  Please continue your efforts at being more active, low cholesterol diet, and weight control.  Please keep your appointments with your specialists as you may have planned - cardiology, kidney doctor  Please go to the LAB at the blood drawing area for the tests to be done - at the [address] [facility] in the basement  You will be contacted by phone if any changes need to be made immediately.  Otherwise, you will receive a letter about your results with an explanation, but please check with MyChart first.  Please make an Appointment to return in 6 months, or sooner if needed

## 2024-09-04 ENCOUNTER — Encounter: Payer: Self-pay | Admitting: Internal Medicine

## 2024-09-04 NOTE — Assessment & Plan Note (Signed)
 Lab Results  Component Value Date   LDLCALC 79 04/16/2024   uncontrolled pt to continue current statin crestor  40 mg every day, zetia  10 every day and f/u lab today, for improved dM diet, declines nutrition referral

## 2024-09-04 NOTE — Assessment & Plan Note (Addendum)
 With hyperglycemia,  Lab Results  Component Value Date   HGBA1C >14.0 (H) 12/12/2023   Severe uncontrolled, , pt to continue current medical treatment glipziide xl 10 every day f/u Lab today, pt continues to decline endo f/u

## 2024-09-04 NOTE — Assessment & Plan Note (Signed)
 BP Readings from Last 3 Encounters:  09/03/24 136/72  08/24/24 (!) 138/107  03/04/24 103/61   Stable, pt to continue medical treatment lisinopril  2.5 every day, coreg  3.125 bid

## 2024-09-04 NOTE — Assessment & Plan Note (Signed)
 Last vitamin D Lab Results  Component Value Date   VD25OH 16.73 (L) 12/12/2023   Low, to start oral replacement

## 2024-09-04 NOTE — Assessment & Plan Note (Signed)
 No overt bleeding, but with recent dizziness weakness - for f/u cbc and iron  lab,  to f/u any worsening symptoms or concerns

## 2024-09-06 ENCOUNTER — Ambulatory Visit: Attending: Cardiovascular Disease | Admitting: Cardiovascular Disease

## 2024-09-06 ENCOUNTER — Emergency Department (HOSPITAL_COMMUNITY)
Admission: EM | Admit: 2024-09-06 | Discharge: 2024-09-07 | Discharge status: Against Medical Advice | Attending: Emergency Medicine | Admitting: Emergency Medicine

## 2024-09-06 ENCOUNTER — Encounter (HOSPITAL_COMMUNITY): Payer: Self-pay

## 2024-09-06 ENCOUNTER — Encounter: Payer: Self-pay | Admitting: Cardiovascular Disease

## 2024-09-06 ENCOUNTER — Telehealth: Payer: Self-pay

## 2024-09-06 ENCOUNTER — Other Ambulatory Visit (INDEPENDENT_AMBULATORY_CARE_PROVIDER_SITE_OTHER)

## 2024-09-06 ENCOUNTER — Other Ambulatory Visit: Payer: Self-pay

## 2024-09-06 VITALS — BP 110/66 | HR 83 | Ht 74.0 in | Wt 231.4 lb

## 2024-09-06 DIAGNOSIS — D649 Anemia, unspecified: Secondary | ICD-10-CM | POA: Diagnosis present

## 2024-09-06 DIAGNOSIS — I110 Hypertensive heart disease with heart failure: Secondary | ICD-10-CM | POA: Insufficient documentation

## 2024-09-06 DIAGNOSIS — I5020 Unspecified systolic (congestive) heart failure: Secondary | ICD-10-CM

## 2024-09-06 DIAGNOSIS — Z7984 Long term (current) use of oral hypoglycemic drugs: Secondary | ICD-10-CM | POA: Insufficient documentation

## 2024-09-06 DIAGNOSIS — I2510 Atherosclerotic heart disease of native coronary artery without angina pectoris: Secondary | ICD-10-CM | POA: Diagnosis not present

## 2024-09-06 DIAGNOSIS — Z5329 Procedure and treatment not carried out because of patient's decision for other reasons: Secondary | ICD-10-CM | POA: Diagnosis not present

## 2024-09-06 DIAGNOSIS — I25118 Atherosclerotic heart disease of native coronary artery with other forms of angina pectoris: Secondary | ICD-10-CM

## 2024-09-06 DIAGNOSIS — E119 Type 2 diabetes mellitus without complications: Secondary | ICD-10-CM | POA: Insufficient documentation

## 2024-09-06 DIAGNOSIS — Z79899 Other long term (current) drug therapy: Secondary | ICD-10-CM | POA: Diagnosis not present

## 2024-09-06 DIAGNOSIS — E1165 Type 2 diabetes mellitus with hyperglycemia: Secondary | ICD-10-CM | POA: Diagnosis not present

## 2024-09-06 DIAGNOSIS — F17200 Nicotine dependence, unspecified, uncomplicated: Secondary | ICD-10-CM | POA: Insufficient documentation

## 2024-09-06 DIAGNOSIS — Z951 Presence of aortocoronary bypass graft: Secondary | ICD-10-CM | POA: Insufficient documentation

## 2024-09-06 DIAGNOSIS — D638 Anemia in other chronic diseases classified elsewhere: Secondary | ICD-10-CM | POA: Diagnosis not present

## 2024-09-06 LAB — CBC WITH DIFFERENTIAL/PLATELET
Abs Immature Granulocytes: 0.04 K/uL (ref 0.00–0.07)
Basophils Absolute: 0.1 K/uL (ref 0.0–0.1)
Basophils Absolute: 0.1 K/uL (ref 0.0–0.1)
Basophils Relative: 1.1 % (ref 0.0–3.0)
Basophils Relative: 1 %
Eosinophils Absolute: 0.3 K/uL (ref 0.0–0.7)
Eosinophils Absolute: 0.3 K/uL (ref 0.0–0.5)
Eosinophils Relative: 4.1 % (ref 0.0–5.0)
Eosinophils Relative: 3 %
HCT: 22.2 % — CL (ref 39.0–52.0)
HCT: 24.2 % — ABNORMAL LOW (ref 39.0–52.0)
Hemoglobin: 7.3 g/dL — CL (ref 13.0–17.0)
Hemoglobin: 7.3 g/dL — ABNORMAL LOW (ref 13.0–17.0)
Immature Granulocytes: 0 %
Lymphocytes Relative: 22.5 % (ref 12.0–46.0)
Lymphocytes Relative: 26 %
Lymphs Abs: 1.9 K/uL (ref 0.7–4.0)
Lymphs Abs: 2.5 K/uL (ref 0.7–4.0)
MCH: 28.3 pg (ref 26.0–34.0)
MCHC: 32.8 g/dL (ref 30.0–36.0)
MCHC: 30.2 g/dL (ref 30.0–36.0)
MCV: 88.3 fl (ref 78.0–100.0)
MCV: 93.8 fL (ref 80.0–100.0)
Monocytes Absolute: 0.9 K/uL (ref 0.1–1.0)
Monocytes Absolute: 1 K/uL (ref 0.1–1.0)
Monocytes Relative: 10.6 % (ref 3.0–12.0)
Monocytes Relative: 10 %
Neutro Abs: 5.2 K/uL (ref 1.4–7.7)
Neutro Abs: 5.5 K/uL (ref 1.7–7.7)
Neutrophils Relative %: 61.7 % (ref 43.0–77.0)
Neutrophils Relative %: 60 %
Platelets: 477 K/uL — ABNORMAL HIGH (ref 150.0–400.0)
Platelets: 445 K/uL — ABNORMAL HIGH (ref 150–400)
RBC: 2.51 Mil/uL — ABNORMAL LOW (ref 4.22–5.81)
RBC: 2.58 MIL/uL — ABNORMAL LOW (ref 4.22–5.81)
RDW: 19.3 % — ABNORMAL HIGH (ref 11.5–15.5)
RDW: 18.3 % — ABNORMAL HIGH (ref 11.5–15.5)
WBC: 8.4 K/uL (ref 4.0–10.5)
WBC: 9.4 K/uL (ref 4.0–10.5)
nRBC: 0 % (ref 0.0–0.2)

## 2024-09-06 LAB — MICROALBUMIN / CREATININE URINE RATIO
Creatinine,U: 242.8 mg/dL
Microalb Creat Ratio: 7 mg/g (ref 0.0–30.0)
Microalb, Ur: 1.7 mg/dL (ref 0.0–1.9)

## 2024-09-06 LAB — URINALYSIS, ROUTINE W REFLEX MICROSCOPIC
Bilirubin Urine: NEGATIVE
Hgb urine dipstick: NEGATIVE
Ketones, ur: NEGATIVE
Leukocytes,Ua: NEGATIVE
Nitrite: NEGATIVE
RBC / HPF: NONE SEEN (ref 0–?)
Specific Gravity, Urine: 1.02 (ref 1.000–1.030)
Total Protein, Urine: NEGATIVE
Urine Glucose: NEGATIVE
Urobilinogen, UA: 1 (ref 0.0–1.0)
WBC, UA: NONE SEEN (ref 0–?)
pH: 6 (ref 5.0–8.0)

## 2024-09-06 LAB — BASIC METABOLIC PANEL WITH GFR
BUN: 16 mg/dL (ref 6–23)
CO2: 25 meq/L (ref 19–32)
Calcium: 8.7 mg/dL (ref 8.4–10.5)
Chloride: 106 meq/L (ref 96–112)
Creatinine, Ser: 1.45 mg/dL (ref 0.40–1.50)
GFR: 51.66 mL/min — ABNORMAL LOW (ref >60.00)
Glucose, Bld: 155 mg/dL — ABNORMAL HIGH (ref 70–99)
Potassium: 4.5 meq/L (ref 3.5–5.1)
Sodium: 140 meq/L (ref 135–145)

## 2024-09-06 LAB — IBC PANEL
Iron: 28 ug/dL — ABNORMAL LOW (ref 42–165)
Saturation Ratios: 6.6 % — ABNORMAL LOW (ref 20.0–50.0)
TIBC: 425.6 ug/dL (ref 250.0–450.0)
Transferrin: 304 mg/dL (ref 212.0–360.0)

## 2024-09-06 LAB — TSH: TSH: 2.31 u[IU]/mL (ref 0.35–5.50)

## 2024-09-06 LAB — FERRITIN: Ferritin: 9.3 ng/mL — ABNORMAL LOW (ref 22.0–322.0)

## 2024-09-06 LAB — LIPID PANEL
Cholesterol: 108 mg/dL (ref 0–200)
HDL: 31.9 mg/dL — ABNORMAL LOW (ref >39.00)
LDL Cholesterol: 56 mg/dL (ref 0–99)
NonHDL: 76.59
Total CHOL/HDL Ratio: 3
Triglycerides: 105 mg/dL (ref 0.0–149.0)
VLDL: 21 mg/dL (ref 0.0–40.0)

## 2024-09-06 LAB — HEPATIC FUNCTION PANEL
ALT: 13 U/L (ref 0–53)
AST: 12 U/L (ref 0–37)
Albumin: 4.2 g/dL (ref 3.5–5.2)
Alkaline Phosphatase: 82 U/L (ref 39–117)
Bilirubin, Direct: 0 mg/dL (ref 0.0–0.3)
Total Bilirubin: 0.2 mg/dL (ref 0.2–1.2)
Total Protein: 7.7 g/dL (ref 6.0–8.3)

## 2024-09-06 NOTE — Telephone Encounter (Signed)
 Pt should go to ED now   Thanks

## 2024-09-06 NOTE — ED Triage Notes (Signed)
 Patient sent by PCP for abnormal labs. He reports he was not told what was abnormal. He doesn't know what labs were drawn. Labs were drawn about 2:30 today.

## 2024-09-06 NOTE — Progress Notes (Signed)
 Cardiology Office Note:    Date:  09/06/2024   ID:  [name], DOB 1960-12-13, MRN [id number]  PCP:  Norleen Lynwood ORN, MD   [facility] Providers Cardiologist:  Ozell Fell, MD Cardiology APP:  Lelon Glendia ONEIDA DEVONNA     Referring MD: Norleen Lynwood ORN, MD   Chief Complaint  Patient presents with   Chest Pain    History of Present Illness:    [name] is a 64 y.o. male with a hx of:  Coronary artery disease S/p BMS x2 to the proximal LCx in 2009 NSTEMI in 2012 2/2 ISR >> PCI: POBA to LCx NSTEMI in 2016>> cath: RCA 100, LCx 100, mod disease in LM and LAD - Pt declined CABG>>Med Rx Ischemic cardiomyopathy Heart failure with mildly reduced ejection fraction (HFmrEF) Echocardiogram 4/19: EF 40-45 Moderate mitral regurgitation  Hypertension Hyperlipidemia Seizure disorder Self-reported history of clotting disorder Tobacco use - quit in 2023 Nonadherence - improved compliance over time  The patient is here alone today.  He has experienced sudden episodes of chest pain that feel like a heaviness.  He states that this occurs when he feels like his spirit from another person comes into his body.  He states that the symptoms also have abrupt offset.  They might last several minutes.  There is no clear exertional component.  He denies shortness of breath, leg swelling, heart palpitations, orthopnea, or PND.  Current Medications: Current Meds  Medication Sig   Accu-Chek FastClix Lancets MISC USE TO CHECK BLOOD GLUCOSE DAILY   ACCU-CHEK GUIDE TEST test strip USE ONCE DAILY TO CHECK BLOOD SUGAR   albuterol  (VENTOLIN  HFA) 108 (90 Base) MCG/ACT inhaler Inhale 2 puffs into the lungs every 6 (six) hours as needed for wheezing or shortness of breath.   Blood Glucose Monitoring Suppl DEVI 1 each by Does not apply route daily. May substitute to any manufacturer covered by patient's insurance. Dx E11.65   carvedilol  (COREG ) 3.125 MG tablet Take 1 tablet (3.125 mg total) by  mouth 2 (two) times daily with a meal.   Cholecalciferol (VITAMIN D3) 50 MCG (2000 UT) capsule Take 1 capsule (2,000 Units total) by mouth daily.   clopidogrel  (PLAVIX ) 75 MG tablet TAKE 1 TABLET(75 MG) BY MOUTH DAILY   ezetimibe  (ZETIA ) 10 MG tablet Take 1 tablet (10 mg total) by mouth daily.   FERREX 150 150 MG capsule Take 150 mg by mouth daily.   furosemide  (LASIX ) 40 MG tablet TAKE 1 TABLET BY MOUTH EVERY DAY AS NEEDED FOR EDEMA(TAKE FOR WEIGHT GAIN OVER 3 POUNDS IN 1 DAY OR LEG SWELLING) (Patient taking differently: Take 40 mg by mouth as needed. TAKE 1 TABLET BY MOUTH EVERY DAY AS NEEDED FOR EDEMA(TAKE FOR WEIGHT GAIN OVER 3 POUNDS IN 1 DAY OR LEG SWELLING))   glipiZIDE  (GLUCOTROL  XL) 10 MG 24 hr tablet TAKE 2 TABLETS(20 MG) BY MOUTH DAILY WITH BREAKFAST (Patient taking differently: 20 mg daily.)   isosorbide  mononitrate (IMDUR ) 30 MG 24 hr tablet TAKE 1/2 TABLET BY MOUTH DAILY   Lancets Misc. MISC 1 each by Does not apply route daily. May substitute to any manufacturer covered by patient's insurance. Dx E11.65   lisinopril  (ZESTRIL ) 2.5 MG tablet Take 1 tablet (2.5 mg total) by mouth daily.   nitroGLYCERIN  (NITROSTAT ) 0.4 MG SL tablet PLACE 1 TABLET UNDER TONGUE EVERY 5 MINUTES AS NEEDED FOR CHEST PAIN. IF NO RELIEF AFTER 3 DOSES, CALL 911   PHENobarbital  (LUMINAL) 32.4 MG tablet TAKE  1 TABLET BY MOUTH FOUR TIMES DAILY   rosuvastatin  (CRESTOR ) 40 MG tablet TAKE 1 TABLET BY MOUTH EVERY DAY     Allergies:   Metformin  and related   ROS:   Please see the history of present illness.    All other systems reviewed and are negative.  EKGs/Labs/Other Studies Reviewed:    The following studies were reviewed today: Cardiac Studies & Procedures   ______________________________________________________________________________________________ CARDIAC CATHETERIZATION  CARDIAC CATHETERIZATION 05/29/2015  Conclusion 1. Prox Cx to Mid Cx lesion, 100% stenosed. The lesion was previously treated with  a stent (unknown type) . 2. Ramus-1 lesion, 50% stenosed. 3. Ramus-2 lesion, 80% stenosed. 4. 1st Diag lesion, 75% stenosed. 5. Mid LAD lesion, 60% stenosed. 6. LM lesion, 60% stenosed. 7. Prox RCA to Mid RCA lesion, 100% stenosed. 8. Mid RCA lesion, 100% stenosed.   Moderate distal left main (50-60% depending upon views), moderate mid LAD, moderately severe first diagonal, severe obstruction of the ramus intermedius/first obtuse marginal, and total occlusion of the RCA. The previously placed stent in the proximal to mid circumflex is totally occluded. The distal obtuse marginals fill by left to right and right to right collaterals.  Moderate inferior wall hypokinesis. EF 50%   Recommendations:   Hold Plavix   TCTS consultation for consideration of bypass surgery including SVG to RCA, SVG to distal circumflex, SVG to ramus intermedius, SVG to diagonal, and LIMA to LAD. Should it be determined of bypass surgery is not indicated or not possible, consider stenting of the ramus intermedius with continued medical therapy (This would be my second choice rather than the primary treatment option for this relatively young patient). I believe that bypass surgery would be a more durable treatment option given his social issues and comorbidities.  Findings Coronary Findings Diagnostic  Dominance: Co-dominant  Left Main The lesion is type C eccentric .  Left Anterior Descending  First Diagonal Branch calcified .  Second Diagonal Branch The vessel is small in size.  Ramus Intermedius  The lesion is type non-C .  Left Circumflex The vessel is small . The lesion was previously treated with a stent (unknown type) .  First Obtuse Marginal Branch The vessel is small in size.  Second Obtuse Marginal Branch The vessel is small in size.  Third Obtuse Marginal Branch The vessel is small in size. Collaterals 3rd Mrg filled by collaterals from Ramus.  Right Coronary Artery calcified  . The lesion is type C .  First Right Posterolateral Branch Collaterals 1st RPL filled by collaterals from 1st Sept.  Intervention  No interventions have been documented.     ECHOCARDIOGRAM  ECHOCARDIOGRAM COMPLETE 12/08/2020  Narrative ECHOCARDIOGRAM REPORT    Patient Name:   [name] Date of Exam: 12/08/2020 Medical Rec #:  [id number]      Height:       74.0 in Accession #:    [id number]     Weight:       228.8 lb Date of Birth:  [birth year]      BSA:          2.301 m Patient Age:    59 years       BP:           134/74 mmHg Patient Gender: M              HR:           86 bpm. Exam Location:  [address]  Procedure: 2D Echo, Cardiac Doppler and Color Doppler  Indications:    I34.0 Mitral Regurgitation I50.20 Heart failure with reduced ejection fraction  History:        Patient has prior history of Echocardiogram examinations, most recent 01/29/2018. Risk Factors:Diabetes, Dyslipidemia and Current Smoker. Coronary artery disease. NSTEMI x 2. Chronic systolic and diastolic heart failure.  Sonographer:    Carl Coma RDCS Referring Phys: [id number] GLENDIA DASEN WEAVER  IMPRESSIONS   1. Left ventricular ejection fraction, by estimation, is 40 to 45%. The left ventricle has mildly decreased function. The left ventricle demonstrates global hypokinesis. The left ventricular internal cavity size was moderately dilated. There is mild concentric left ventricular hypertrophy. Left ventricular diastolic parameters are indeterminate. 2. Right ventricular systolic function is normal. The right ventricular size is normal. 3. The mitral valve is normal in structure. Mild to moderate mitral valve regurgitation. No evidence of mitral stenosis. 4. The aortic valve is normal in structure. Aortic valve regurgitation is mild to moderate. No aortic stenosis is present. 5. Aortic dilatation noted. There is moderate dilatation of the ascending aorta, measuring 45 mm. 6. The inferior  vena cava is normal in size with greater than 50% respiratory variability, suggesting right atrial pressure of 3 mmHg.  Comparison(s): A prior study was performed on 01/29/2018. Compared prior, improvement in ventricular dilation and mitral regurgitation, increase in aortic regurgitation. The ascending aorta findings were note seen in prior study.  FINDINGS Left Ventricle: Left ventricular ejection fraction, by estimation, is 40 to 45%. The left ventricle has mildly decreased function. The left ventricle demonstrates global hypokinesis. The left ventricular internal cavity size was moderately dilated. There is mild concentric left ventricular hypertrophy. Left ventricular diastolic parameters are indeterminate.  Right Ventricle: The right ventricular size is normal. No increase in right ventricular wall thickness. Right ventricular systolic function is normal.  Left Atrium: Left atrial size was normal in size.  Right Atrium: Right atrial size was normal in size.  Pericardium: Trivial pericardial effusion is present.  Mitral Valve: Mechanism of regurgitation related to posterior restriction. The mitral valve is normal in structure. Mild to moderate mitral valve regurgitation. No evidence of mitral valve stenosis.  Tricuspid Valve: The tricuspid valve is normal in structure. Tricuspid valve regurgitation is not demonstrated. No evidence of tricuspid stenosis.  Aortic Valve: The aortic valve is normal in structure. There is mild aortic valve annular calcification. Aortic valve regurgitation is mild to moderate. Aortic regurgitation PHT measures 586 msec. No aortic stenosis is present.  Pulmonic Valve: The pulmonic valve was not well visualized. Pulmonic valve regurgitation is not visualized. No evidence of pulmonic stenosis.  Aorta: Ascending Aortic Height Index: 2.4 cm/m; low risk. Aortic dilatation noted. There is moderate dilatation of the ascending aorta, measuring 45 mm.  Venous: The  inferior vena cava is normal in size with greater than 50% respiratory variability, suggesting right atrial pressure of 3 mmHg.  IAS/Shunts: The atrial septum is grossly normal.   LEFT VENTRICLE PLAX 2D LVIDd:         6.40 cm  Diastology LVIDs:         5.20 cm  LV e' medial:    5.77 cm/s LV PW:         1.10 cm  LV E/e' medial:  16.6 LV IVS:        1.10 cm  LV e' lateral:   8.05 cm/s LVOT diam:     2.20 cm  LV E/e' lateral: 11.9 LV SV:         64 LV SV Index:  28 LVOT Area:     3.80 cm   RIGHT VENTRICLE RV Basal diam:  3.60 cm RV S prime:     17.70 cm/s TAPSE (M-mode): 2.0 cm  LEFT ATRIUM             Index       RIGHT ATRIUM           Index LA diam:        4.70 cm 2.04 cm/m  RA Area:     13.00 cm LA Vol (A2C):   64.2 ml 27.90 ml/m RA Volume:   32.30 ml  14.04 ml/m LA Vol (A4C):   51.3 ml 22.29 ml/m LA Biplane Vol: 58.2 ml 25.29 ml/m AORTIC VALVE LVOT Vmax:   91.00 cm/s LVOT Vmean:  66.700 cm/s LVOT VTI:    0.169 m AI PHT:      586 msec  AORTA Ao Root diam: 3.80 cm Ao Asc diam:  4.50 cm  MITRAL VALVE MV Area (PHT): 3.53 cm    SHUNTS MV Decel Time: 215 msec    Systemic VTI:  0.17 m MV E velocity: 96.00 cm/s  Systemic Diam: 2.20 cm MV A velocity: 78.10 cm/s MV E/A ratio:  1.23  Stanly Leavens MD Electronically signed by Stanly Leavens MD Signature Date/Time: 12/08/2020/5:22:54 PM    Final   TEE  ECHO TEE 06/16/2015  Narrative *[place]* *[facility]* [address] [place], [place] [address] [phone]  ------------------------------------------------------------------- Transesophageal Echocardiography  Patient:    [name], [name] MR #:       [id number] Study Date: 06/16/2015 Gender:     M Age:        69 Height:     188 cm Weight:     85.3 kg BSA:        2.11 m^2 Pt. Status: Room:  ADMITTING    Maude Emmer, M.D. PERFORMING   Maude Emmer, M.D. ATTENDING    Dunn, Dayna N ORDERING     Dunn, Dayna  N SONOGRAPHER  Tinnie Barefoot, RDCS, CCT  cc:  -------------------------------------------------------------------  ------------------------------------------------------------------- Indications:      Mitral regurgitation 424.0.  ------------------------------------------------------------------- Study Conclusions  - Impressions: Mild LVE EF 45-50% mid and basal inferior wall hypokinesis Moderate ischemic MR: may benefit from annuloplasty repair during CABG Normal AV Normal PV Mild TR No LAA thrombus No ASD/PFO No aortic debris No pericardial effusion  Impressions:  - Mild LVE EF 45-50% mid and basal inferior wall hypokinesis Moderate ischemic MR: may benefit from annuloplasty repair during CABG Normal AV Normal PV Mild TR No LAA thrombus No ASD/PFO No aortic debris No pericardial effusion  Diagnostic transesophageal echocardiography.  2D and color Doppler. Birthdate:  Patient birthdate: 10-Feb-1961.  Age:  Patient is 63 yr old.  Sex:  Gender: male.    BMI: 24.1 kg/m^2.  Blood pressure: 119/85  Patient status:  Outpatient.  Study date:  Study date: 06/16/2015. Study time: 11:24 AM.  Location:  Endoscopy.  -------------------------------------------------------------------  ------------------------------------------------------------------- Measurements  Mitral valve                              Value Mitral maximal regurg velocity, PISA      482   cm/s Mitral regurg VTI, PISA                   181   cm Mitral ERO, PISA  0.13  cm^2 Mitral regurg volume, PISA                24    ml  Legend: (L)  and  (H)  mark values outside specified reference range.  ------------------------------------------------------------------- Prepared and Electronically Authenticated by  Maude Emmer, M.D. 2016-08-26T17:08:39        ______________________________________________________________________________________________      EKG:         Recent Labs: 12/12/2023: ALT 9; TSH 1.71 08/24/2024: B Natriuretic Peptide 42.7; BUN 20; Creatinine, Ser 1.44; Hemoglobin 8.5; Platelets 457; Potassium 4.2; Sodium 139  Recent Lipid Panel    Component Value Date/Time   CHOL 135 04/16/2024 1557   TRIG 96 04/16/2024 1557   HDL 38 (L) 04/16/2024 1557   CHOLHDL 3.6 04/16/2024 1557   CHOLHDL 9 12/12/2023 1559   VLDL 69.0 (H) 12/12/2023 1559   LDLCALC 79 04/16/2024 1557   LDLCALC 74 04/22/2022 1641     Risk Assessment/Calculations:                Physical Exam:    VS:  BP 110/66 (BP Location: Left Arm, Patient Position: Sitting, Cuff Size: Large)   Pulse 83   Ht 6' 2 (1.88 m)   Wt 231 lb 6.4 oz (105 kg)   SpO2 97%   BMI 29.71 kg/m     Wt Readings from Last 3 Encounters:  09/06/24 231 lb (104.8 kg)  09/06/24 231 lb 6.4 oz (105 kg)  09/03/24 230 lb (104.3 kg)     GEN:  Well nourished, well developed in no acute distress HEENT: Normal NECK: No JVD; No carotid bruits LYMPHATICS: No lymphadenopathy CARDIAC: RRR, no murmurs, rubs, gallops RESPIRATORY:  Clear to auscultation without rales, wheezing or rhonchi  ABDOMEN: Soft, non-tender, non-distended MUSCULOSKELETAL:  No edema; No deformity  SKIN: Warm and dry NEUROLOGIC:  Alert and oriented x 3 PSYCHIATRIC:  Normal affect   Assessment & Plan HFrEF (heart failure with reduced ejection fraction) (HCC) Appears well compensated.  Treated with carvedilol  and low-dose lisinopril .  No congestive symptoms or evidence of volume overload on exam.  Check 2D echo.  Last echo from 2022 reviewed with LVEF 40 to 45%, normal RV function, mild to moderate MR, mild to moderate AI. Coronary artery disease involving native coronary artery of native heart with other form of angina pectoris Patient with symptoms of chest discomfort, typical and atypical features.  Continue treatment with clopidogrel , carvedilol , isosorbide , and rosuvastatin .  Check exercise Myoview stress test to evaluate  for inducible ischemia.  Patient with known obstructive CAD, refused CABG many years ago.  Treated medically.       Informed Consent   Shared Decision Making/Informed Consent The risks [chest pain, shortness of breath, cardiac arrhythmias, dizziness, blood pressure fluctuations, myocardial infarction, stroke/transient ischemic attack, nausea, vomiting, allergic reaction, radiation exposure, metallic taste sensation and life-threatening complications (estimated to be 1 in 10,000)], benefits (risk stratification, diagnosing coronary artery disease, treatment guidance) and alternatives of a nuclear stress test were discussed in detail with [name] and he agrees to proceed.       Medication Adjustments/Labs and Tests Ordered: Current medicines are reviewed at length with the patient today.  Concerns regarding medicines are outlined above.  Orders Placed This Encounter  Procedures   Cardiac Stress Test: Informed Consent Details: Physician/Practitioner Attestation; Transcribe to consent form and obtain patient signature   MYOCARDIAL PERFUSION IMAGING   ECHOCARDIOGRAM COMPLETE   No orders of the defined types were placed in this  encounter.   Patient Instructions  Medication Instructions:  Your physician recommends that you continue on your current medications as directed. Please refer to the Current Medication list given to you today.  *If you need a refill on your cardiac medications before your next appointment, please call your pharmacy*  Testing/Procedures: Echocardiogram  Your physician has requested that you have an echocardiogram. Echocardiography is a painless test that uses sound waves to create images of your heart. It provides your doctor with information about the size and shape of your heart and how well your heart's chambers and valves are working. This procedure takes approximately one hour. There are no restrictions for this procedure. Please do NOT wear cologne, perfume,  aftershave, or lotions (deodorant is allowed). Please arrive 15 minutes prior to your appointment time.  Please note: We ask at that you not bring children with you during ultrasound (echo/ vascular) testing. Due to room size and safety concerns, children are not allowed in the ultrasound rooms during exams. Our front office staff cannot provide observation of children in our lobby area while testing is being conducted. An adult accompanying a patient to their appointment will only be allowed in the ultrasound room at the discretion of the ultrasound technician under special circumstances. We apologize for any inconvenience.  Myoview Stress Test Your physician has requested that you have en exercise stress myoview. For further information please visit [email or web address] Please follow instruction sheet, as given.   Follow-Up: At [facility], you and your health needs are our priority.  As part of our continuing mission to provide you with exceptional heart care, our providers are all part of one team.  This team includes your primary Cardiologist (physician) and Advanced Practice Providers or APPs (Physician Assistants and Nurse Practitioners) who all work together to provide you with the care you need, when you need it.  Your next appointment:   6 months  Provider:   One of our Advanced Practice Providers (APPs): Morse Clause, PA-C  Lamarr Satterfield, NP Miriam Shams, NP  Olivia Pavy, PA-C Josefa Beauvais, NP  Leontine Salen, PA-C Orren Fabry, PA-C  [place], PA-C Ernest Dick, NP  Damien Braver, NP Jon Hails, PA-C  Waddell Donath, PA-C    Dayna Dunn, PA-C  Scott Weaver, PA-C Lum Louis, NP Katlyn West, NP Callie Goodrich, PA-C  Xika Zhao, NP Thom Sluder, PA-C    Kathleen Johnson, PA-C      Signed, Ozell Fell, MD  09/06/2024 5:54 PM    [facility]

## 2024-09-06 NOTE — Telephone Encounter (Signed)
 Pt mother got an emergency call and lives out of town. I called pt on conference and told him to go to the ED due to his lab results but could not give him further information. He says he is across the street from [place] and will go right now.

## 2024-09-06 NOTE — ED Notes (Signed)
 Pt state he is leaving due to not wanting to wait for a room to open up

## 2024-09-06 NOTE — Telephone Encounter (Signed)
 CRITICAL VALUE STICKER  CRITICAL VALUE: Hemoglobin 7.3, Hematicrit 22.2  RECEIVER (on-site recipient of call): Hadassah  DATE & TIME NOTIFIED: 09/06/24 @ 3:42 PM  MESSENGER (representative from lab): Darice   MD NOTIFIED: YES  TIME OF NOTIFICATION: 3:44 PM

## 2024-09-06 NOTE — ED Provider Triage Note (Signed)
 Emergency Medicine Provider Triage Evaluation Note  [name] , a 63 y.o. male  was evaluated in triage.  Pt complains of no symptoms presently, referred to ED by PCP secondary to hemoglobin of 7.3.  Denies having any GI losses, denies bright red blood per rectum..  Review of Systems  Positive: As above Negative:   Physical Exam  BP 132/72 (BP Location: Right Arm)   Pulse 86   Temp 98 F (36.7 C)   Resp 18   Ht 6' 2.5 (1.892 m)   Wt 104.8 kg   SpO2 100%   BMI 29.26 kg/m  Gen:   Awake, no distress   Resp:  Normal effort  MSK:   Moves extremities without difficulty  Other:    Medical Decision Making  Medically screening exam initiated at 5:54 PM.  Appropriate orders placed.  [name] was informed that the remainder of the evaluation will be completed by another provider, this initial triage assessment does not replace that evaluation, and the importance of remaining in the ED until their evaluation is complete.  Initial orders placed   [name], [name] 09/06/24 1755

## 2024-09-06 NOTE — Patient Instructions (Signed)
 Medication Instructions:  Your physician recommends that you continue on your current medications as directed. Please refer to the Current Medication list given to you today.  *If you need a refill on your cardiac medications before your next appointment, please call your pharmacy*  Testing/Procedures: Echocardiogram  Your physician has requested that you have an echocardiogram. Echocardiography is a painless test that uses sound waves to create images of your heart. It provides your doctor with information about the size and shape of your heart and how well your heart's chambers and valves are working. This procedure takes approximately one hour. There are no restrictions for this procedure. Please do NOT wear cologne, perfume, aftershave, or lotions (deodorant is allowed). Please arrive 15 minutes prior to your appointment time.  Please note: We ask at that you not bring children with you during ultrasound (echo/ vascular) testing. Due to room size and safety concerns, children are not allowed in the ultrasound rooms during exams. Our front office staff cannot provide observation of children in our lobby area while testing is being conducted. An adult accompanying a patient to their appointment will only be allowed in the ultrasound room at the discretion of the ultrasound technician under special circumstances. We apologize for any inconvenience.  Myoview Stress Test Your physician has requested that you have en exercise stress myoview. For further information please visit [email or web address] Please follow instruction sheet, as given.   Follow-Up: At [facility], you and your health needs are our priority.  As part of our continuing mission to provide you with exceptional heart care, our providers are all part of one team.  This team includes your primary Cardiologist (physician) and Advanced Practice Providers or APPs (Physician Assistants and Nurse Practitioners) who all work  together to provide you with the care you need, when you need it.  Your next appointment:   6 months  Provider:   One of our Advanced Practice Providers (APPs): Morse Clause, PA-C  Lamarr Satterfield, NP Miriam Shams, NP  Olivia Pavy, PA-C Josefa Beauvais, NP  Leontine Salen, PA-C Orren Fabry, PA-C  [place], PA-C Ernest Dick, NP  Damien Braver, NP Jon Hails, PA-C  Waddell Donath, PA-C    Dayna Dunn, PA-C  Scott Weaver, PA-C Lum Louis, NP Katlyn West, NP Callie Goodrich, PA-C  Xika Zhao, NP Sheng Haley, PA-C    Kathleen Johnson, PA-C

## 2024-09-07 ENCOUNTER — Ambulatory Visit: Payer: Self-pay | Admitting: Internal Medicine

## 2024-09-07 ENCOUNTER — Other Ambulatory Visit: Payer: Self-pay | Admitting: Internal Medicine

## 2024-09-07 DIAGNOSIS — D509 Iron deficiency anemia, unspecified: Secondary | ICD-10-CM

## 2024-09-07 LAB — HEMOGLOBIN A1C: Hgb A1c MFr Bld: 6.7 % — ABNORMAL HIGH (ref 4.6–6.5)

## 2024-09-07 MED ORDER — POLYSACCHARIDE IRON COMPLEX 150 MG PO CAPS: 150.0000 mg | ORAL_CAPSULE | Freq: Two times a day (BID) | ORAL | 1 refills | Status: AC

## 2024-09-07 NOTE — ED Provider Notes (Signed)
 Patient is belligerent, loud and condescending to EDP stating I already left once today, I'll leave again.  Refusing basic metabolic blood work which I have explained is necessary for admission and refusing hemoccult.  States nurse already stated he needs this and he stated he declines.  States he left the waiting room.  Just wants his emergent endoscopy.  EDP politely explained with tech Olena presents that GI will not do this without confirmation of GI bleeding.  Patient states I know my body.  Patient is also refusing blood. EDP explained I am just trying to help.  Patient then stated EDP was different in a condescending manner.  Patient is refusing bloodwork needed for admission and hemoccult.  Patient is signing out AMA as he is refusing testing and care offered.     Tammye Kahler, MD 09/07/24 [phone]

## 2024-09-07 NOTE — ED Provider Notes (Signed)
 [place] EMERGENCY DEPARTMENT AT [facility] Provider Note   CSN: [id number] Arrival date & time: 09/06/24  1650     Patient presents with: Abnormal Labs   [name] is a 63 y.o. male.   The history is provided by the patient.  Illness Quality:  Low hemoglobin for some time Severity:  Moderate Onset quality:  Gradual Timing:  Constant Progression:  Worsening Associated symptoms: no fever, no vomiting and no wheezing   Patient with h/o anemia reports he was sent to the ED for endoscopy for worsening anemia.      Past Medical History:  Diagnosis Date   Anemia 01/01/2017   Ascending aorta dilation    Echocardiogram 2/22: EF 40-45, mild LVH, normal RVSF, mild to mod MR, mild to mod AI, ascending aorta 45 mm.     CAD (coronary artery disease)    a. Overlapping BMS-prox LCx in 2009 b. NSTEMI 2012 due to prox LCx ISR s/p PTCA alone; EF 45% w/ basal inf HK. Had run out of Plavix  1 week prior. c. NSTEMI 05/2015 after running out of Plavix  - cath with 3V CAD, Patient left AMA prior to agreeing to CABG. // d. LHC 8/16: LM 60; LAD mid 60, D1 75; RI 50, 80; LCx prox stent 100; RCA prox 100; EF 45-50 >> seen by [facility] - declined CABG >> Med Rx.   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   Hypertension    MI (myocardial infarction) (HCC) x 7   Poor compliance    Seizure disorder (HCC)    Seizures (HCC)    TOBACCO ABUSE 02/22/2009   Valvular heart  disease    a. Echo 05/2015: mild AI, mod MR.     Prior to Admission medications   Medication Sig Start Date End Date Taking? Authorizing Provider  Accu-Chek FastClix Lancets MISC USE TO CHECK BLOOD GLUCOSE DAILY 04/20/24   Norleen Lynwood ORN, MD  ACCU-CHEK GUIDE TEST test strip USE ONCE DAILY TO CHECK BLOOD SUGAR 07/19/24   Norleen Lynwood ORN, MD  albuterol  (VENTOLIN  HFA) 108 (90 Base) MCG/ACT inhaler Inhale 2 puffs into the lungs every 6 (six) hours as needed for wheezing or shortness of breath. 09/03/24   Norleen Lynwood ORN, MD  Blood Glucose Monitoring Suppl DEVI 1 each by Does not apply route daily. May substitute to any manufacturer covered by patient's insurance. Dx E11.65 01/19/24   Norleen Lynwood ORN, MD  carvedilol  (COREG ) 3.125 MG tablet Take 1 tablet (3.125 mg total) by mouth 2 (two) times daily with a meal. 09/03/24   Norleen Lynwood ORN, MD  Cholecalciferol (VITAMIN D3) 50 MCG (2000 UT) capsule Take 1 capsule (2,000 Units total) by mouth daily. 04/11/21   Norleen Lynwood ORN, MD  clopidogrel  (PLAVIX ) 75 MG tablet TAKE 1 TABLET(75 MG) BY MOUTH DAILY 10/01/23   Swinyer, Rosaline HERO, NP  ezetimibe  (ZETIA ) 10 MG tablet Take 1 tablet (10 mg total) by mouth daily. 12/14/23   Norleen Lynwood ORN, MD  FERREX 150 150 MG capsule Take 150 mg by mouth daily. 05/07/24   [provider]  furosemide  (LASIX ) 40 MG tablet TAKE 1 TABLET BY MOUTH EVERY DAY AS NEEDED FOR EDEMA(TAKE FOR WEIGHT GAIN OVER 3 POUNDS IN 1 DAY OR LEG SWELLING) Patient taking differently: Take 40 mg by mouth as needed. TAKE 1 TABLET BY MOUTH EVERY DAY AS NEEDED FOR EDEMA(TAKE FOR WEIGHT GAIN OVER 3 POUNDS IN 1 DAY OR LEG SWELLING) 04/11/21   Norleen Lynwood ORN, MD  glipiZIDE  (GLUCOTROL  XL) 10 MG 24 hr tablet TAKE 2 TABLETS(20 MG) BY MOUTH DAILY WITH BREAKFAST Patient taking differently: 20 mg daily. 08/06/24   Norleen Lynwood ORN, MD  isosorbide  mononitrate (IMDUR ) 30 MG 24 hr tablet TAKE 1/2 TABLET BY MOUTH DAILY 12/14/23   Norleen Lynwood ORN, MD  Lancets Misc. MISC 1 each by Does  not apply route daily. May substitute to any manufacturer covered by patient's insurance. Dx E11.65 01/19/24   Norleen Lynwood ORN, MD  lisinopril  (ZESTRIL ) 2.5 MG tablet Take 1 tablet (2.5 mg total) by mouth daily. 12/14/23   Norleen Lynwood ORN, MD  nitroGLYCERIN  (NITROSTAT ) 0.4 MG SL tablet PLACE 1 TABLET UNDER TONGUE EVERY 5 MINUTES AS NEEDED FOR CHEST PAIN. IF NO RELIEF AFTER 3 DOSES, CALL 911 11/03/23   Wonda Sharper, MD  PHENobarbital  (LUMINAL) 32.4 MG tablet TAKE 1 TABLET BY MOUTH FOUR TIMES DAILY 03/26/24   Onita Duos, MD  rosuvastatin  (CRESTOR ) 40 MG tablet TAKE 1 TABLET BY MOUTH EVERY DAY 12/14/23   Norleen Lynwood ORN, MD    Allergies: Metformin  and related    Review of Systems  Constitutional:  Negative for fever and unexpected weight change.  HENT:  Negative for facial swelling.   Respiratory:  Negative for wheezing.   Gastrointestinal:  Negative for vomiting.  All other systems reviewed and are negative.   Updated Vital Signs BP (!) 142/84   Pulse 91   Temp 98 F (36.7 C)   Resp 18   Ht 6' 2.5 (1.892 m)   Wt 104.8 kg   SpO2 100%   BMI 29.26 kg/m   Physical Exam Vitals and nursing note reviewed.  Constitutional:      General: He is not in acute distress.    Appearance: He is well-developed. He is not diaphoretic.  HENT:     Head: Normocephalic and atraumatic.     Nose: Nose normal.  Eyes:     Conjunctiva/sclera: Conjunctivae normal.     Pupils: Pupils are equal, round, and reactive to light.  Cardiovascular:     Rate and Rhythm: Normal rate and regular rhythm.     Pulses: Normal pulses.     Heart sounds: Normal heart sounds.  Pulmonary:     Effort: Pulmonary effort is normal.     Breath sounds: Normal breath sounds. No wheezing or rales.  Abdominal:     General: Bowel sounds are normal.     Palpations: Abdomen is soft.     Tenderness: There is no abdominal tenderness. There is no guarding or rebound.  Musculoskeletal:        General: Normal range of motion.     Cervical  back: Normal range of motion and neck supple.  Skin:    General: Skin is warm and dry.     Capillary Refill: Capillary refill takes less than 2 seconds.  Neurological:  General: No focal deficit present.     Mental Status: He is alert and oriented to person, place, and time.     Deep Tendon Reflexes: Reflexes normal.  Psychiatric:        Mood and Affect: Mood normal.        Behavior: Behavior normal.     (all labs ordered are listed, but only abnormal results are displayed) Results for orders placed or performed during the hospital encounter of 09/06/24  CBC with Differential   Collection Time: 09/06/24  6:02 PM  Result Value Ref Range   WBC 9.4 4.0 - 10.5 K/uL   RBC 2.58 (L) 4.22 - 5.81 MIL/uL   Hemoglobin 7.3 (L) 13.0 - 17.0 g/dL   HCT 75.7 (L) 60.9 - 47.9 %   MCV 93.8 80.0 - 100.0 fL   MCH 28.3 26.0 - 34.0 pg   MCHC 30.2 30.0 - 36.0 g/dL   RDW 81.6 (H) 88.4 - 84.4 %   Platelets 445 (H) 150 - 400 K/uL   nRBC 0.0 0.0 - 0.2 %   Neutrophils Relative % 60 %   Neutro Abs 5.5 1.7 - 7.7 K/uL   Lymphocytes Relative 26 %   Lymphs Abs 2.5 0.7 - 4.0 K/uL   Monocytes Relative 10 %   Monocytes Absolute 1.0 0.1 - 1.0 K/uL   Eosinophils Relative 3 %   Eosinophils Absolute 0.3 0.0 - 0.5 K/uL   Basophils Relative 1 %   Basophils Absolute 0.1 0.0 - 0.1 K/uL   Immature Granulocytes 0 %   Abs Immature Granulocytes 0.04 0.00 - 0.07 K/uL   DG Chest Port 1 View Result Date: 08/24/2024 CLINICAL DATA:  Chest pain. EXAM: PORTABLE CHEST 1 VIEW COMPARISON:  Chest radiograph dated 02/16/2023. FINDINGS: No focal consolidation, pleural effusion or pneumothorax. Mild cardiomegaly. No acute osseous pathology. IMPRESSION: 1. No active disease. 2. Mild cardiomegaly. Electronically Signed   By: Vanetta Chou M.D.   On: 08/24/2024 20:09    Radiology: No results found.   Procedures   Medications Ordered in the ED - No data to display                                  Medical Decision  Making Patient with ongoing anemia with unknown source   Amount and/or Complexity of Data Reviewed External Data Reviewed: notes.    Details: Previous notes reviewed  Labs: ordered.    Details: Normal white count 9.4, low hemoglobin 7.3, elevated platelets 445   Risk Risk Details: Hemoglobin was reported in EPIC as 8.5 on 11/4 it is now 7.3.  are not reported in EPIC at this time and cannot used for admission.  Plan was lab work and hemoccult card and consult to GI with admission to medicine.  Patient declines this at this time.  Patient is leaving AMA      Final diagnoses:  Anemia, unspecified type   Patient is AO3, he understands the risk of leaving against medical advice.   ED Discharge Orders     None          Rosalie Gelpi, MD 09/07/24 [phone]

## 2024-09-07 NOTE — ED Notes (Addendum)
 Patient refusing blood work or IV until speaking with MD. Patient reports he has already had blood work done earlier. Notified patient of need for rectal exam at later time and patient said he would be refusing this test also. Dr. Nettie notified.

## 2024-09-07 NOTE — ED Notes (Signed)
Patient left before signing AMA form

## 2024-09-15 ENCOUNTER — Telehealth (HOSPITAL_COMMUNITY): Payer: Self-pay

## 2024-09-15 NOTE — Telephone Encounter (Signed)
 Detailed instructions left on the patient's answering machine. [name] CCT

## 2024-09-20 ENCOUNTER — Other Ambulatory Visit: Payer: Self-pay | Admitting: Cardiovascular Disease

## 2024-09-20 DIAGNOSIS — I25118 Atherosclerotic heart disease of native coronary artery with other forms of angina pectoris: Secondary | ICD-10-CM

## 2024-09-20 DIAGNOSIS — I5020 Unspecified systolic (congestive) heart failure: Secondary | ICD-10-CM

## 2024-09-21 ENCOUNTER — Ambulatory Visit (HOSPITAL_COMMUNITY)
Admission: RE | Admit: 2024-09-21 | Discharge: 2024-09-21 | Disposition: A | Source: Ambulatory Visit | Attending: Cardiovascular Disease | Admitting: Cardiovascular Disease

## 2024-09-21 DIAGNOSIS — I5020 Unspecified systolic (congestive) heart failure: Secondary | ICD-10-CM | POA: Insufficient documentation

## 2024-09-21 DIAGNOSIS — I25118 Atherosclerotic heart disease of native coronary artery with other forms of angina pectoris: Secondary | ICD-10-CM | POA: Insufficient documentation

## 2024-09-21 LAB — MYOCARDIAL PERFUSION IMAGING
LV dias vol: 283 mL (ref 62–150)
LV sys vol: 151 mL (ref 4.2–5.8)
Nuc Stress EF: 47 %
Peak HR: 142 {beats}/min
Rest HR: 92 {beats}/min
Rest Nuclear Isotope Dose: 11 mCi
SDS: 0
SRS: 5
SSS: 4
Stress Nuclear Isotope Dose: 32.3 mCi
TID: 1.05

## 2024-09-21 MED ORDER — TECHNETIUM TC 99M TETROFOSMIN IV KIT
32.3000 | PACK | Freq: Once | INTRAVENOUS | Status: AC | PRN
  Administered 2024-09-21: 32.3 via INTRAVENOUS

## 2024-09-21 MED ORDER — REGADENOSON 0.4 MG/5ML IV SOLN
0.4000 mg | Freq: Once | INTRAVENOUS | Status: AC
  Administered 2024-09-21: 0.4 mg via INTRAVENOUS

## 2024-09-21 MED ORDER — REGADENOSON 0.4 MG/5ML IV SOLN
INTRAVENOUS | Status: AC
  Filled 2024-09-21: qty 5

## 2024-09-21 MED ORDER — TECHNETIUM TC 99M TETROFOSMIN IV KIT
11.0000 | PACK | Freq: Once | INTRAVENOUS | Status: AC | PRN
  Administered 2024-09-21: 11 via INTRAVENOUS

## 2024-09-23 ENCOUNTER — Telehealth: Payer: Self-pay | Admitting: Neurology

## 2024-09-23 NOTE — Telephone Encounter (Signed)
 Patient request refill for PHENobarbital  (LUMINAL) 32.4 MG tablet send [facility] [address] [place]

## 2024-09-24 ENCOUNTER — Telehealth: Payer: Self-pay

## 2024-09-24 NOTE — Telephone Encounter (Signed)
 Copied from CRM #[id number]. Topic: Clinical - Lab/Test Results >> Sep 24, 2024 12:44 PM [name] wrote: Reason for CRM: Patient was returning missed call. Agent located that this was in regards to lab results. Relayed note that PCP attached with results and patient did not have any further questions. Told patient I could get him scheduled for a repeat CBC, but patient stated he just had lab work done on 11/28 at [facility], so he did not see the need to come back again. Please call patient if he still needs the repeat CBC.

## 2024-09-25 ENCOUNTER — Ambulatory Visit: Payer: Self-pay | Admitting: Cardiovascular Disease

## 2024-09-27 ENCOUNTER — Other Ambulatory Visit: Payer: Self-pay

## 2024-09-27 NOTE — Telephone Encounter (Signed)
 Yes please to repeat cbc as we dont have access to those other records, and we are not sure he had that particular testing done,  Thanks

## 2024-09-27 NOTE — Telephone Encounter (Signed)
 Pt picked up Medication 09/22/24

## 2024-09-27 NOTE — Telephone Encounter (Signed)
 Called and spoke with Pt , Pt states understanding no further questions at this time.

## 2024-09-30 ENCOUNTER — Other Ambulatory Visit

## 2024-09-30 ENCOUNTER — Encounter: Payer: Self-pay | Admitting: Internal Medicine

## 2024-09-30 ENCOUNTER — Telehealth: Payer: Self-pay

## 2024-09-30 ENCOUNTER — Ambulatory Visit: Payer: Self-pay | Admitting: Internal Medicine

## 2024-09-30 ENCOUNTER — Other Ambulatory Visit: Payer: Self-pay | Admitting: Internal Medicine

## 2024-09-30 DIAGNOSIS — D509 Iron deficiency anemia, unspecified: Secondary | ICD-10-CM | POA: Diagnosis not present

## 2024-09-30 DIAGNOSIS — E611 Iron deficiency: Secondary | ICD-10-CM

## 2024-09-30 HISTORY — DX: Iron deficiency: E61.1

## 2024-09-30 LAB — CBC WITH DIFFERENTIAL/PLATELET
Basophils Absolute: 0.1 K/uL (ref 0.0–0.1)
Basophils Relative: 1 % (ref 0.0–3.0)
Eosinophils Absolute: 0.3 K/uL (ref 0.0–0.7)
Eosinophils Relative: 4.2 % (ref 0.0–5.0)
HCT: 23.2 % — CL (ref 39.0–52.0)
Hemoglobin: 7.5 g/dL — CL (ref 13.0–17.0)
Lymphocytes Relative: 27.7 % (ref 12.0–46.0)
Lymphs Abs: 1.7 K/uL (ref 0.7–4.0)
MCHC: 32.3 g/dL (ref 30.0–36.0)
MCV: 86.9 fl (ref 78.0–100.0)
Monocytes Absolute: 0.8 K/uL (ref 0.1–1.0)
Monocytes Relative: 12.6 % — ABNORMAL HIGH (ref 3.0–12.0)
Neutro Abs: 3.4 K/uL (ref 1.4–7.7)
Neutrophils Relative %: 54.5 % (ref 43.0–77.0)
Platelets: 440 K/uL — ABNORMAL HIGH (ref 150.0–400.0)
RBC: 2.66 Mil/uL — ABNORMAL LOW (ref 4.22–5.81)
RDW: 18.3 % — ABNORMAL HIGH (ref 11.5–15.5)
WBC: 6.2 K/uL (ref 4.0–10.5)

## 2024-09-30 NOTE — Telephone Encounter (Signed)
 Patient came back for the repeat labs.  He asked when someone calls in regards to his results, please only call his phone and not his family.

## 2024-09-30 NOTE — Telephone Encounter (Signed)
 The test results show that your current treatment is OK, except the hemoglobin is just not improving well over the past month. We should refer you to the IV infusion center for IV iron  if that is ok with you. I will place the order, and hopefully you will hear soon. Otherwise, Please continue the same plan.   Ok to let pt know, thanks

## 2024-09-30 NOTE — Telephone Encounter (Signed)
 CRITICAL VALUE STICKER  CRITICAL VALUE: Hemoglobin 7.5 Hematocrit 23.2  RECEIVER (on-site recipient of call): Darice  DATE & TIME NOTIFIED: 09/30/24 at 4:59 pm  MESSENGER (representative from lab):  MD NOTIFIED:  Lynwood Rush MD  TIME OF NOTIFICATION: 5:00  RESPONSE:  Awaiting PCP response

## 2024-10-01 ENCOUNTER — Telehealth: Payer: Self-pay

## 2024-10-01 ENCOUNTER — Other Ambulatory Visit (HOSPITAL_COMMUNITY): Payer: Self-pay | Admitting: Internal Medicine

## 2024-10-01 DIAGNOSIS — D509 Iron deficiency anemia, unspecified: Secondary | ICD-10-CM | POA: Insufficient documentation

## 2024-10-01 NOTE — Telephone Encounter (Signed)
 Called and left voice message for patient to call back and review results

## 2024-10-01 NOTE — Telephone Encounter (Addendum)
 Dr. Norleen, patient will be scheduled as soon as possible.  Auth Submission: NO AUTH NEEDED Site of care: Site of care: [facility] Payer: [employer] dual complete medicare Medication & CPT/J Code(s) submitted: Feraheme (ferumoxytol ) [id number] Diagnosis Code:  Route of submission (phone, fax, portal):  Phone # Fax # Auth type: Buy/Bill PB Units/visits requested: 510mg  x 2 doses Reference number:  Approval from: 10/01/24 to 01/17/25

## 2024-10-01 NOTE — Telephone Encounter (Signed)
 Called and spoke with patient. Informed him of results and PCP recommendation. Patient expressed understanding

## 2024-10-01 NOTE — Telephone Encounter (Unsigned)
 Copied from CRM #[id number]. Topic: General - Other >> Oct 01, 2024 12:42 PM Zebedee SAUNDERS wrote: Reason for CRM: Pt returning Lucetta Cleatrice ORN, CMA call regarding lab results which were provided. Pt had question regarding his hemoglobin and the IV fusion. Please call pt at [phone].

## 2024-10-12 ENCOUNTER — Other Ambulatory Visit: Payer: Self-pay | Admitting: Internal Medicine

## 2024-10-12 DIAGNOSIS — E1165 Type 2 diabetes mellitus with hyperglycemia: Secondary | ICD-10-CM

## 2024-10-19 ENCOUNTER — Ambulatory Visit (HOSPITAL_COMMUNITY)
Admission: RE | Admit: 2024-10-19 | Discharge: 2024-10-19 | Disposition: A | Discharge status: Home / Self Care | Source: Ambulatory Visit | Attending: Cardiovascular Disease | Admitting: Cardiovascular Disease

## 2024-10-19 DIAGNOSIS — I5020 Unspecified systolic (congestive) heart failure: Secondary | ICD-10-CM | POA: Diagnosis not present

## 2024-10-19 DIAGNOSIS — I25118 Atherosclerotic heart disease of native coronary artery with other forms of angina pectoris: Secondary | ICD-10-CM | POA: Diagnosis not present

## 2024-10-20 LAB — ECHOCARDIOGRAM COMPLETE
Area-P 1/2: 5.93 cm2
MV M vel: 5.17 m/s
MV Peak grad: 106.9 mmHg
P 1/2 time: 970 ms
Radius: 1 cm
S' Lateral: 5.4 cm

## 2024-10-27 NOTE — Progress Notes (Signed)
 "  Chief Complaint: Discuss EGD and colonoscopy  HPI:    [name] is a 64 year old African-American male, known by Dr. Legrand, with a past medical history as listed below including CAD status post NSTEMI 2012, status post STEMI 05/2015, status post numerous PCI's, total of 9 coronary stents, 10 heart attacks and cardiac arrested x 3, seizure disorder (no seizures since 09/2014) and a clotting disorder on Plavix , as well as diabetes multiple others, who was referred to me by Norleen Lynwood ORN, MD for discussion of an EGD and colonoscopy.    07/28/23 patient seen in clinic by Elida Shawl for iron  deficiency anemia.  At that time percent saturation ratios 13, hemoglobin 10.9 on 04/30/2023.  That point repeat CBC iron  studies and ferritin ordered.  Recommended cardiac clearance to include Plavix  hold for EGD and colonoscopy which is recommended at the hospital.    08/29/23 Dr. Wonda patient's cardiologist cleared him for a Plavix  hold for 5 days.  He was to be scheduled at the hospital per recommendations from Dr. Legrand.    09/06/2024 iron  panel with an iron  low 28% saturation low at 6.6.  Ferritin low at 9.3.    09/30/2024 CBC with hemoglobin of 7.5.  Patient referred for IV iron  infusion.  Discussed the use of AI scribe software for clinical note transcription with the patient, who gave verbal consent to proceed.  History of Present Illness  Iron  deficiency anemia was first identified in late 2025, and he is currently receiving iron  infusions. He was unaware of anemia and low hemoglobin until informed during medical visits. Blood testing last performed in December 2025. Most recent hemoglobin was approximately 7.5 a few weeks prior to this visit.  Denies shortness of breath, presyncope, dizziness upon standing, chest pain, or feeling sick. No awareness of overt bleeding.  Complex cardiac history includes ten prior myocardial infarctions, four coronary stents (last intervention in 2016 or 2017),  and prior recommendations for coronary artery bypass grafting, which he declined. Followed by a cardiologist and takes clopidogrel  and lisinopril . History of prolonged hospitalizations and has expressed mistrust of certain medical providers and institutions based on prior experiences.  Patient was really not clear on why he was here and very tangential with his history.  It was hard to keep him on track at today's visit.  Eventually discussed EGD and colonoscopy at the hospital which were previously recommended by [facility], he has not had any new symptoms.  He is following for iron  infusions with 2 scheduled in the coming weeks.  He is not seeing any blood in his stool but remains on Plavix .  Nuys fever, chills or weight loss.    Previous cardiac and GI history: Echo 09/06/2024 IMPRESSIONS     1. Left ventricular ejection fraction, by estimation, is 40 to 45%. The  left ventricle has mildly decreased function. The left ventricle  demonstrates regional wall motion abnormalities (see scoring  diagram/findings for description). The left ventricular   internal cavity size was severely dilated. There is moderate concentric  left ventricular hypertrophy. Left ventricular diastolic parameters are  consistent with Grade II diastolic dysfunction (pseudonormalization).  There is hypokinesis of the left  ventricular, entire anterolateral wall and inferolateral wall.   2. Right ventricular systolic function is normal. The right ventricular  size is normal. There is normal pulmonary artery systolic pressure.   3. Left atrial size was moderately dilated.   4. The mitral valve is normal in structure. Severe mitral valve  regurgitation. No evidence of  mitral stenosis.   5. The aortic valve is tricuspid. Aortic valve regurgitation is mild to  moderate. No aortic stenosis is present.   6. Aortic dilatation noted. There is mild dilatation of the ascending  aorta, measuring 43 mm.   7. The inferior vena  cava is normal in size with greater than 50%  respiratory variability, suggesting right atrial pressure of 3 mmHg.   Comparison(s): Changes from prior study are noted. LV is more dilated  compared to prior. Mitral regurgitation is now severe, functional due to  LV dilation.   ECHO 12/08/2020: 1. Left ventricular ejection fraction, by estimation, is 40 to 45%. The left ventricle has mildly decreased function. The left ventricle demonstrates global hypokinesis. The left ventricular internal cavity size was moderately dilated. There is mild concentric left ventricular hypertrophy. Left ventricular diastolic parameters are indeterminate. 2. Right ventricular systolic function is normal. The right ventricular size is normal. 3. The mitral valve is normal in structure. Mild to moderate mitral valve regurgitation. No evidence of mitral stenosis. 4. The aortic valve is normal in structure. Aortic valve regurgitation is mild to moderate. No aortic stenosis is present. 5. Aortic dilatation noted. There is moderate dilatation of the ascending aorta, measuring 45 mm. 6. The inferior vena cava is normal in size with greater than 50% respiratory variability, suggesting right atrial pressure of 3 mmHg.   Cardiac catheterization 05/29/2015: Prox Cx to Mid Cx lesion, 100% stenosed. The lesion was previously treated with a stent (unknown type) . Ramus-1 lesion, 50% stenosed. Ramus-2 lesion, 80% stenosed. 1st Diag lesion, 75% stenosed. Mid LAD lesion, 60% stenosed. LM lesion, 60% stenosed. Prox RCA to Mid RCA lesion, 100% stenosed. Mid RCA lesion, 100% stenosed.   Moderate distal left main (50-60% depending upon views), moderate mid LAD, moderately severe first diagonal, severe obstruction of the ramus intermedius/first obtuse marginal, and total occlusion of the RCA. The previously placed stent in the proximal to mid circumflex is totally occluded. The distal obtuse marginals fill by left to right and right to right  collaterals. Moderate inferior wall hypokinesis. EF 50%     PAST GI PROCEDURES:   Colonoscopy 09/07/2013 by Dr. Debrah:  Sessile polyp measuring 2 mm in size was found in the rectum, polypectomy was performed with cold forceps Mild diverticulosis was noted in the descending colon The colon mucosa was otherwise normal Path report showed 9 colonic mucosa Recall colonoscopy 10 years  Past Medical History:  Diagnosis Date   Anemia 01/01/2017   Ascending aorta dilation    Echocardiogram 2/22: EF 40-45, mild LVH, normal RVSF, mild to mod MR, mild to mod AI, ascending aorta 45 mm.     CAD (coronary artery disease)    a. Overlapping BMS-prox LCx in 2009 b. NSTEMI 2012 due to prox LCx ISR s/p PTCA alone; EF 45% w/ basal inf HK. Had run out of Plavix  1 week prior. c. NSTEMI 05/2015 after running out of Plavix  - cath with 3V CAD, Patient left AMA prior to agreeing to CABG. // d. LHC 8/16: LM 60; LAD mid 60, D1 75; RI 50, 80; LCx prox stent 100; RCA prox 100; EF 45-50 >> seen by [facility] - declined CABG >> Med Rx.   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   Hypertension    Iron  deficiency 09/30/2024   MI (myocardial infarction) (HCC) x 7   Poor compliance    Seizure disorder (HCC)    Seizures (HCC)    TOBACCO ABUSE 02/22/2009   Valvular heart disease    a. Echo 05/2015: mild AI, mod MR.    Past Surgical History:  Procedure Laterality Date   CARDIAC CATHETERIZATION [name]  05/29/2015   Procedure: Left Heart Cath and Coronary Angiography;  Surgeon: Victory LELON Sharps, MD;  Location: [facility] INVASIVE CV LAB;  Service: Cardiovascular;  Laterality: [name];   COLONOSCOPY     CORONARY ANGIOPLASTY  03/2011   PTCA-prox LCx ISR   CORONARY ANGIOPLASTY WITH STENT PLACEMENT  10/2007   BMS-prox LCx   LEFT HEART CATHETERIZATION WITH CORONARY ANGIOGRAM [name] 10/13/2013   Procedure: LEFT HEART CATHETERIZATION WITH CORONARY ANGIOGRAM;  Surgeon: Ozell JONETTA Fell, MD;  Location: [facility] CATH LAB;  Service: Cardiovascular;  Laterality: [name];   TEE WITHOUT CARDIOVERSION [name] 06/16/2015   Procedure: TRANSESOPHAGEAL ECHOCARDIOGRAM (TEE);  Surgeon: Maude JAYSON Emmer, MD;  Location: [facility] ENDOSCOPY;  Service: Cardiovascular;  Laterality: [name];    Current Outpatient Medications  Medication Sig Dispense Refill   Accu-Chek FastClix Lancets MISC USE TO CHECK BLOOD GLUCOSE DAILY 102 each 0   ACCU-CHEK GUIDE TEST test strip USE ONCE DAILY TO CHECK BLOOD SUGAR 100 strip 0   albuterol  (VENTOLIN  HFA) 108 (90 Base) MCG/ACT inhaler Inhale 2 puffs into the lungs every 6 (six) hours as needed for wheezing or shortness of breath. 6.7 g 2   Blood Glucose Monitoring Suppl DEVI 1 each by Does not apply route daily. May substitute to any manufacturer covered by patient's insurance. Dx E11.65 1 each 0   carvedilol  (COREG ) 3.125 MG tablet Take 1 tablet (3.125 mg total) by mouth 2 (two) times daily with a meal. 180 tablet 3   Cholecalciferol (VITAMIN D3) 50 MCG (2000 UT) capsule Take 1 capsule (2,000 Units total) by mouth daily. 90 capsule 99   clopidogrel  (PLAVIX ) 75 MG tablet TAKE 1 TABLET(75 MG) BY MOUTH DAILY 90 tablet 3   ezetimibe  (ZETIA ) 10 MG tablet Take 1 tablet (10 mg total) by mouth daily. 90 tablet 3   furosemide  (LASIX ) 40 MG tablet TAKE 1 TABLET BY MOUTH EVERY DAY AS NEEDED FOR EDEMA(TAKE FOR WEIGHT GAIN OVER 3 POUNDS IN 1 DAY OR LEG SWELLING) (Patient taking differently: Take 40 mg by mouth as needed. TAKE 1 TABLET BY MOUTH  EVERY DAY AS NEEDED FOR EDEMA(TAKE FOR WEIGHT GAIN OVER 3 POUNDS IN 1 DAY OR LEG SWELLING)) 90 tablet 3   glipiZIDE  (GLUCOTROL  XL) 10 MG 24 hr tablet TAKE 2 TABLETS(20 MG) BY MOUTH DAILY WITH BREAKFAST (Patient taking differently: 20 mg daily.) 180 tablet 0   iron  polysaccharides (NU-IRON ) 150 MG capsule Take 1 capsule (150 mg total) by mouth 2 (two) times daily. 180 capsule 1   isosorbide  mononitrate (IMDUR ) 30 MG 24 hr tablet TAKE 1/2 TABLET BY MOUTH DAILY 45 tablet 3   Lancets Misc. MISC 1 each by Does not apply route daily. May substitute to any manufacturer  covered by patient's insurance. Dx E11.65 100 each 0   lisinopril  (ZESTRIL ) 2.5 MG tablet Take 1 tablet (2.5 mg total) by mouth daily. 90 tablet 3   nitroGLYCERIN  (NITROSTAT ) 0.4 MG SL tablet PLACE 1 TABLET UNDER TONGUE EVERY 5 MINUTES AS NEEDED FOR CHEST PAIN. IF NO RELIEF AFTER 3 DOSES, CALL 911 25 tablet 10   PHENobarbital  (LUMINAL) 32.4 MG tablet TAKE 1 TABLET BY MOUTH FOUR TIMES DAILY 360 tablet 3   rosuvastatin  (CRESTOR ) 40 MG tablet TAKE 1 TABLET BY MOUTH EVERY DAY 90 tablet 3   No current facility-administered medications for this visit.    Allergies as of 10/28/2024 - Review Complete 09/06/2024  Allergen Reaction Noted   Metformin  and related Diarrhea 01/29/2024    Family History  Problem Relation Age of Onset   Hypertension Brother    Atrial fibrillation Brother    Coronary artery disease Maternal Aunt    Heart disease Maternal Uncle    Stroke Maternal Uncle    Heart attack Maternal Uncle        X3   Hypertension Other    Colon cancer Neg Hx    Stomach cancer Neg Hx    Rectal cancer Neg Hx    Pancreatic cancer Neg Hx     Social History   Socioeconomic History   Marital status: Single    Spouse name: Not on file   Number of children: 0   Years of education: 12th   Highest education level: Not on file  Occupational History   Occupation: former curator  Tobacco Use   Smoking status: Former    Types:  Cigars   Smokeless tobacco: Never   Tobacco comments:    smoke cigars 2 per day  Vaping Use   Vaping status: Never Used  Substance and Sexual Activity   Alcohol use: No   Drug use: No   Sexual activity: Not Currently  Other Topics Concern   Not on file  Social History Narrative   Lives alone   Right Handed   Drinks 5-6 cups caffeine   Social Drivers of Health   Tobacco Use: Medium Risk (09/06/2024)   Patient History    Smoking Tobacco Use: Former    Smokeless Tobacco Use: Never    Passive Exposure: Not on Actuary Strain: Not on file  Food Insecurity: Not on file  Transportation Needs: Not on file  Physical Activity: Not on file  Stress: Not on file  Social Connections: Not on file  Intimate Partner Violence: Not on file  Depression (PHQ2-9): Low Risk (09/03/2024)   Depression (PHQ2-9)    PHQ-2 Score: 0  Alcohol Screen: Not on file  Housing: Not on file  Utilities: Not on file  Health Literacy: Not on file    Review of Systems:    Constitutional: No weight loss, fever or chills Cardiovascular: No chest pain Respiratory: No SOB Gastrointestinal: See HPI and otherwise negative   Physical Exam:  Vital signs: BP (!) 102/56   Pulse 86   Ht 6' 2 (1.88 m)   Wt 240 lb (108.9 kg)   BMI 30.81 kg/m    Constitutional:  AA male appears to be in NAD, Well developed, Well nourished, alert and cooperative Respiratory: Respirations even and unlabored. Lungs clear to auscultation bilaterally.   No wheezes, crackles, or rhonchi.  Cardiovascular: Normal S1, S2. No MRG. Regular rate and rhythm. No peripheral edema, cyanosis or pallor.  Gastrointestinal:  Soft, nondistended, nontender. No rebound or guarding. Normal  bowel sounds. No appreciable masses or hepatomegaly. Rectal:  Not performed.  Psychiatric: Oriented to person, place and time. Demonstrates good judgement and reason without abnormal affect or behaviors.  RELEVANT LABS AND IMAGING: CBC     Component Value Date/Time   WBC 6.2 09/30/2024 1547   RBC 2.66 (L) 09/30/2024 1547   HGB 7.5 Repeated and verified X2. (LL) 09/30/2024 1547   HGB 10.0 (L) 01/19/2018 1505   HCT 23.2 Repeated and verified X2. (LL) 09/30/2024 1547   HCT 32.4 (L) 12/31/2018 1437   PLT 440.0 (H) 09/30/2024 1547   PLT 462 (H) 01/19/2018 1505   MCV 86.9 09/30/2024 1547   MCV 86 01/19/2018 1505   MCH 28.3 09/06/2024 1802   MCHC 32.3 09/30/2024 1547   RDW 18.3 (H) 09/30/2024 1547   RDW 16.1 (H) 01/19/2018 1505   LYMPHSABS 1.7 09/30/2024 1547   LYMPHSABS 2.4 01/19/2018 1505   MONOABS 0.8 09/30/2024 1547   EOSABS 0.3 09/30/2024 1547   EOSABS 0.3 01/19/2018 1505   BASOSABS 0.1 09/30/2024 1547   BASOSABS 0.0 01/19/2018 1505    CMP     Component Value Date/Time   NA 140 09/06/2024 1420   NA 138 04/16/2024 1557   K 4.5 09/06/2024 1420   CL 106 09/06/2024 1420   CO2 25 09/06/2024 1420   GLUCOSE 155 (H) 09/06/2024 1420   BUN 16 09/06/2024 1420   BUN 15 04/16/2024 1557   CREATININE 1.45 09/06/2024 1420   CREATININE 1.62 (H) 04/22/2022 1641   CALCIUM  8.7 09/06/2024 1420   PROT 7.7 09/06/2024 1420   PROT 7.7 12/31/2018 1614   ALBUMIN 4.2 09/06/2024 1420   ALBUMIN 4.5 12/31/2018 1614   AST 12 09/06/2024 1420   ALT 13 09/06/2024 1420   ALKPHOS 82 09/06/2024 1420   BILITOT 0.2 09/06/2024 1420   BILITOT <0.2 12/31/2018 1614   GFRNONAA 55 (L) 08/24/2024 1909   GFRAA >60 12/31/2018 1437   Assessment & Plan Iron  deficiency anemia Chronic, severe iron  deficiency anemia with recent hemoglobin of 7.5 g/dL on Dec 11, currently asymptomatic but at risk for further decline requiring transfusion if hemoglobin drops below 7 g/dL. Ongoing iron  infusions. - Ordered CBC and CMP today as well as iron  studies - Discussed iron  infusions with the patient, agree that he should have these done, he has to schedule in the coming weeks - Discussed transfusion threshold if hemoglobin decreases below 7 g/dL. - As his  previously discussed with him in October he needs an EGD and colonoscopy for further evaluation.  There was a lot of back-and-forth before and it was decided he needed to have these procedures at the hospital given his strong cardiac history.  Dr. Wonda did approve a Plavix  hold for 5 days.  Again advised the patient to hold his Plavix  for 5 days prior to time procedure.  We have funic it with his prescribed physician to ensure this is acceptable for him.  Will see if we need to get a new cardiac clearance or not. - After a long discussion the patient agreed to do his procedures at [facility] even though he does not trust that hospital, he trusts Dr. Legrand.  He was then explained the latest we could get this scheduled was around 11:00 and he would still have to be there at 9, he tells me his medicines do not allow him to wake up laterally.  He then went on to say that he did not have a ride.  He declined to  schedule today. - If patient's hemoglobin is below 7 he will be required to go urgently to the hospital and I would recommend he be admitted for an EGD and colonoscopy given his barriers to care in the outpatient world.  Suspected gastrointestinal bleeding Ongoing concern for gastrointestinal bleeding as the etiology of persistent iron  deficiency anemia, with increased risk due to clopidogrel  use.  - Discussed need for diagnostic endoscopy and colonoscopy to evaluate for gastrointestinal bleeding. - Discussed outpatient scheduling of procedures at the hospital for safety due to cardiac history. - Reviewed alternative of referral to a different gastroenterology clinic if he prefers not to have procedures at the current hospital.  History of seizure disorder: - Last seizure in 2015  Heart failure MR EF: Last LVEF 40-45%  CAD status post numerous MIs and PCI's - Previously declined CABG, no angina on Plavix   Patient to follow-up in clinic with us  as above for an EGD and colonoscopy at the  hospital.  When he left he did not schedule these.  He needed to find a ride.  If he calls back they need to be scheduled at the hospital.  Again if his labs show steady decline in hemoglobin then he may require admission.   Delon Failing, PA-C [facility] 10/27/2024, 2:59 PM  Cc: Norleen Lynwood ORN, MD  "

## 2024-10-28 ENCOUNTER — Other Ambulatory Visit (INDEPENDENT_AMBULATORY_CARE_PROVIDER_SITE_OTHER)

## 2024-10-28 ENCOUNTER — Encounter: Payer: Self-pay | Admitting: Physician Assistant

## 2024-10-28 ENCOUNTER — Telehealth: Payer: Self-pay

## 2024-10-28 ENCOUNTER — Ambulatory Visit (INDEPENDENT_AMBULATORY_CARE_PROVIDER_SITE_OTHER): Admitting: Physician Assistant

## 2024-10-28 VITALS — BP 102/56 | HR 86 | Ht 74.0 in | Wt 240.0 lb

## 2024-10-28 DIAGNOSIS — D509 Iron deficiency anemia, unspecified: Secondary | ICD-10-CM

## 2024-10-28 DIAGNOSIS — Z7902 Long term (current) use of antithrombotics/antiplatelets: Secondary | ICD-10-CM

## 2024-10-28 DIAGNOSIS — Z87891 Personal history of nicotine dependence: Secondary | ICD-10-CM | POA: Diagnosis not present

## 2024-10-28 DIAGNOSIS — I2510 Atherosclerotic heart disease of native coronary artery without angina pectoris: Secondary | ICD-10-CM

## 2024-10-28 LAB — CBC WITH DIFFERENTIAL/PLATELET
Basophils Absolute: 0.1 K/uL (ref 0.0–0.1)
Basophils Relative: 1.4 % (ref 0.0–3.0)
Eosinophils Absolute: 0.2 K/uL (ref 0.0–0.7)
Eosinophils Relative: 3.6 % (ref 0.0–5.0)
HCT: 23.8 % — CL (ref 39.0–52.0)
Hemoglobin: 7.7 g/dL — CL (ref 13.0–17.0)
Lymphocytes Relative: 24.1 % (ref 12.0–46.0)
Lymphs Abs: 1.4 K/uL (ref 0.7–4.0)
MCHC: 32.5 g/dL (ref 30.0–36.0)
MCV: 85.5 fl (ref 78.0–100.0)
Monocytes Absolute: 0.6 K/uL (ref 0.1–1.0)
Monocytes Relative: 10.3 % (ref 3.0–12.0)
Neutro Abs: 3.4 K/uL (ref 1.4–7.7)
Neutrophils Relative %: 60.6 % (ref 43.0–77.0)
Platelets: 459 K/uL — ABNORMAL HIGH (ref 150.0–400.0)
RBC: 2.78 Mil/uL — ABNORMAL LOW (ref 4.22–5.81)
RDW: 17.7 % — ABNORMAL HIGH (ref 11.5–15.5)
WBC: 5.7 K/uL (ref 4.0–10.5)

## 2024-10-28 LAB — COMPREHENSIVE METABOLIC PANEL WITH GFR
ALT: 10 U/L (ref 3–53)
AST: 10 U/L (ref 5–37)
Albumin: 4 g/dL (ref 3.5–5.2)
Alkaline Phosphatase: 75 U/L (ref 39–117)
BUN: 27 mg/dL — ABNORMAL HIGH (ref 6–23)
CO2: 24 meq/L (ref 19–32)
Calcium: 8.5 mg/dL (ref 8.4–10.5)
Chloride: 101 meq/L (ref 96–112)
Creatinine, Ser: 1.6 mg/dL — ABNORMAL HIGH (ref 0.40–1.50)
GFR: 45.86 mL/min — ABNORMAL LOW
Glucose, Bld: 457 mg/dL — ABNORMAL HIGH (ref 70–99)
Potassium: 4.8 meq/L (ref 3.5–5.1)
Sodium: 133 meq/L — ABNORMAL LOW (ref 135–145)
Total Bilirubin: 0.1 mg/dL — ABNORMAL LOW (ref 0.2–1.2)
Total Protein: 7.8 g/dL (ref 6.0–8.3)

## 2024-10-28 LAB — IBC + FERRITIN
Ferritin: 5.2 ng/mL — ABNORMAL LOW (ref 22.0–322.0)
Iron: 54 ug/dL (ref 42–165)
Saturation Ratios: 12.1 % — ABNORMAL LOW (ref 20.0–50.0)
TIBC: 445.2 ug/dL (ref 250.0–450.0)
Transferrin: 318 mg/dL (ref 212.0–360.0)

## 2024-10-28 NOTE — Telephone Encounter (Signed)
 Request for surgical clearance:     Endoscopy Procedure  What type of surgery is being performed?     Colon /EGD  When is this surgery scheduled?     12/16/24  What type of clearance is required ?   Pharmacy  Are there any medications that need to be held prior to surgery and how long? Plavix  x5 days   Practice name and name of physician performing surgery?      [facility]  What is your office phone and fax number?      Phone- [phone]  Fax- [phone]  Anesthesia type (None, local, MAC, general) ?       MAC  Please route your response to Blondie Barks, CMA

## 2024-10-28 NOTE — Patient Instructions (Addendum)
 We have sent the following medications to your pharmacy for you to pick up at your convenience:  Suprep   Your provider has requested that you go to the basement level for lab work before leaving today. Press B on the elevator. The lab is located at the first door on the left as you exit the elevator.  You have been scheduled for an endoscopy and colonoscopy. Please follow the written instructions given to you at your visit today.  If you use inhalers (even only as needed), please bring them with you on the day of your procedure.  DO NOT TAKE 7 DAYS PRIOR TO TEST- Trulicity (dulaglutide) Ozempic, Wegovy (semaglutide) Mounjaro, Zepbound (tirzepatide) Bydureon Bcise (exanatide extended release)  DO NOT TAKE 1 DAY PRIOR TO YOUR TEST Rybelsus (semaglutide) Adlyxin (lixisenatide) Victoza (liraglutide) Byetta (exanatide) ____________________    Due to recent changes in healthcare laws, you may see the results of your imaging and laboratory studies on MyChart before your provider has had a chance to review them.  We understand that in some cases there may be results that are confusing or concerning to you. Not all laboratory results come back in the same time frame and the provider may be waiting for multiple results in order to interpret others.  Please give us  48 hours in order for your provider to thoroughly review all the results before contacting the office for clarification of your results.   _______________________________________________________  Thank you for choosing me and [facility]. Delon Failing, PA-C

## 2024-10-28 NOTE — Telephone Encounter (Signed)
 Rec'd call from the lab.  Patient has a critical Hgb of 7.7 and a critical Hematocrit of 23.8

## 2024-10-29 ENCOUNTER — Telehealth: Payer: Self-pay

## 2024-10-29 ENCOUNTER — Ambulatory Visit: Payer: Self-pay | Admitting: Physician Assistant

## 2024-10-29 NOTE — Progress Notes (Signed)
 Thanks for sending this case to me, and I have reviewed your note and some additional records.  (Thanks for responding to my other note earlier this morning regarding his lab results and plan).  His hemoglobin is 7.7 and he needs to be directed to the emergency department today for admission to the medical service to manage the anemia and have his endoscopic testing.  This patient was in the ED early November of this year with a similar hemoglobin and left against advice (after exhibiting typical behavior as documented by those providers).   An outpatient plan for EGD and colonoscopy to workup this anemia will not be feasible for him due to the issues you outlined. Hopefully, he will be cooperative with that plan.  Thank you   - Victory Brand, MD    [facility]

## 2024-10-29 NOTE — Telephone Encounter (Signed)
 Disregard clearance request. Patient has cancelled his outpatient endoscopies.

## 2024-10-29 NOTE — Telephone Encounter (Signed)
 Call patient to schedule an televisit for preop clearance. Did not answer left a voice mail to call back.

## 2024-10-29 NOTE — Telephone Encounter (Signed)
" ° °  Name: [name]  DOB: 1961/07/06  MRN: [id number]  Primary Cardiologist: Ozell Fell, MD   Preoperative team, please contact this patient and set up a phone call appointment for further preoperative risk assessment. Please obtain consent and complete medication review. Thank you for your help.  I confirm that guidance regarding antiplatelet and oral anticoagulation therapy has been completed and, if necessary, noted below.  Patient previously cleared to hold Plavix  per Dr. Fell. Per office protocol, if patient is without any new symptoms or concerns at the time of their virtual visit, he may hold Plavix  for 5 days prior to procedure. Please resume Plavix  as soon as possible postprocedure, at the discretion of the surgeon.   I also confirmed the patient resides in the state of [place] . As per [facility] Medical Board telemedicine laws, the patient must reside in the state in which the provider is licensed.   [name], NP 10/29/2024, 8:15 AM [facility]    "

## 2024-10-29 NOTE — Telephone Encounter (Signed)
 Left message on machine to call back   Called EC (Mother) and asked that she get a message to the pt to return call.

## 2024-10-29 NOTE — Telephone Encounter (Signed)
 Tried contacting patient to schedule TELEVISIT no answer left a detailed vm to call back and schedule

## 2024-10-29 NOTE — Telephone Encounter (Signed)
-----   Message from [facility] Rovonda B sent at 10/29/2024 10:08 AM EST ----- Regarding: RE: Please call I cancelled the hospital procedures. ----- Message ----- From: Anitra Odetta CROME, RN Sent: 10/29/2024   9:40 AM EST To: Blondie Barks, CMA Subject: RE: Please call                                Ro I called this pt and left a message but I see that you already have him set up. Do I need to do anything? ----- Message ----- From: Beather Delon Gibson, PA Sent: 10/29/2024   8:25 AM EST To: [facility] Triage Subject: Please call                                    Please call patient and see message from Dr. Legrand.  His hemoglobin is 7.6.  He needs to go to the hospital and be admitted for an EGD and colonoscopy.  His outpatient procedure needs to be canceled.  Please document the phone call as he may decline.  Thanks, JL L ----- Message ----- From: Legrand Victory CROME DOUGLAS, MD Sent: 10/29/2024   6:09 AM EST To: Delon Gibson Beather, PA Subject: RE: RICK Delon,  I do not see your note in my CC'd charts inbox, so it does not look like it was routed to me. Please do so so I can put the addendum on.  His hemoglobin remains 7.7 (critical lab called in to [facility] late yesterday afternoon), and he needs to be admitted to the hospital for his endoscopic workup.  Have clinical assistant call him today and document that conversation and their note (given his past behavior).  I do not know how this is going to work, since he left the ED against advice when he was there in September with a similar hemoglobin.  But all we can do is try and he will make his choice.  HD  It is absolutely NOT going to work out to get this done in the outpatient setting, so please have the CMA take him off my February 26 to schedule. ----- Message ----- From: Beather Delon Gibson, PA Sent: 10/28/2024   4:00 PM EST To: Victory CROME Legrand DOUGLAS, MD Subject: RICK                                             Just for your information this patient return to clinic today to discuss procedures.  It looks like you and Colleen recommended these be done at the hospital and there were some barriers to his care.  These barriers continue to persist.  He is getting labs repeated today, but I am not sure how we will get these procedures done for him here.  He may end up in the hospital with low hemoglobin and would likely need to be admitted in order to get workup completed.  Delon Beather, PA-C

## 2024-11-01 NOTE — Telephone Encounter (Signed)
 Left message on machine to call back - the pt does not check My Chart messages  Attempted to reach pt mother again as well no answer

## 2024-11-02 ENCOUNTER — Telehealth: Payer: Self-pay | Admitting: Cardiovascular Disease

## 2024-11-02 NOTE — Telephone Encounter (Signed)
 Pt is requesting callback about his results and him wanting to know about an upcoming procedure he was supposed to have. He'd like some clarity. Please advise

## 2024-11-03 ENCOUNTER — Inpatient Hospital Stay (HOSPITAL_COMMUNITY)
Admission: EM | Admit: 2024-11-03 | Discharge: 2024-11-06 | DRG: 378 | Disposition: A | Discharge status: Home / Self Care | Source: Ambulatory Visit | Attending: Internal Medicine | Admitting: Internal Medicine

## 2024-11-03 ENCOUNTER — Other Ambulatory Visit: Payer: Self-pay

## 2024-11-03 ENCOUNTER — Encounter (HOSPITAL_COMMUNITY): Payer: Self-pay

## 2024-11-03 DIAGNOSIS — K648 Other hemorrhoids: Secondary | ICD-10-CM | POA: Diagnosis present

## 2024-11-03 DIAGNOSIS — G40909 Epilepsy, unspecified, not intractable, without status epilepticus: Secondary | ICD-10-CM | POA: Diagnosis present

## 2024-11-03 DIAGNOSIS — I130 Hypertensive heart and chronic kidney disease with heart failure and stage 1 through stage 4 chronic kidney disease, or unspecified chronic kidney disease: Secondary | ICD-10-CM | POA: Diagnosis present

## 2024-11-03 DIAGNOSIS — K2960 Other gastritis without bleeding: Secondary | ICD-10-CM | POA: Diagnosis not present

## 2024-11-03 DIAGNOSIS — K635 Polyp of colon: Secondary | ICD-10-CM | POA: Diagnosis not present

## 2024-11-03 DIAGNOSIS — K5730 Diverticulosis of large intestine without perforation or abscess without bleeding: Secondary | ICD-10-CM | POA: Diagnosis not present

## 2024-11-03 DIAGNOSIS — I5042 Chronic combined systolic (congestive) and diastolic (congestive) heart failure: Secondary | ICD-10-CM | POA: Diagnosis present

## 2024-11-03 DIAGNOSIS — Z8674 Personal history of sudden cardiac arrest: Secondary | ICD-10-CM | POA: Diagnosis not present

## 2024-11-03 DIAGNOSIS — K922 Gastrointestinal hemorrhage, unspecified: Principal | ICD-10-CM | POA: Diagnosis present

## 2024-11-03 DIAGNOSIS — I2510 Atherosclerotic heart disease of native coronary artery without angina pectoris: Secondary | ICD-10-CM | POA: Diagnosis present

## 2024-11-03 DIAGNOSIS — Z7984 Long term (current) use of oral hypoglycemic drugs: Secondary | ICD-10-CM | POA: Diagnosis not present

## 2024-11-03 DIAGNOSIS — K3189 Other diseases of stomach and duodenum: Secondary | ICD-10-CM | POA: Diagnosis present

## 2024-11-03 DIAGNOSIS — E1122 Type 2 diabetes mellitus with diabetic chronic kidney disease: Secondary | ICD-10-CM | POA: Diagnosis present

## 2024-11-03 DIAGNOSIS — D123 Benign neoplasm of transverse colon: Secondary | ICD-10-CM | POA: Diagnosis present

## 2024-11-03 DIAGNOSIS — E7800 Pure hypercholesterolemia, unspecified: Secondary | ICD-10-CM | POA: Diagnosis present

## 2024-11-03 DIAGNOSIS — I25110 Atherosclerotic heart disease of native coronary artery with unstable angina pectoris: Secondary | ICD-10-CM

## 2024-11-03 DIAGNOSIS — I10 Essential (primary) hypertension: Secondary | ICD-10-CM | POA: Diagnosis not present

## 2024-11-03 DIAGNOSIS — E1165 Type 2 diabetes mellitus with hyperglycemia: Secondary | ICD-10-CM | POA: Diagnosis present

## 2024-11-03 DIAGNOSIS — Z955 Presence of coronary angioplasty implant and graft: Secondary | ICD-10-CM

## 2024-11-03 DIAGNOSIS — D509 Iron deficiency anemia, unspecified: Secondary | ICD-10-CM | POA: Diagnosis present

## 2024-11-03 DIAGNOSIS — Z79899 Other long term (current) drug therapy: Secondary | ICD-10-CM | POA: Diagnosis not present

## 2024-11-03 DIAGNOSIS — Z7902 Long term (current) use of antithrombotics/antiplatelets: Secondary | ICD-10-CM | POA: Diagnosis not present

## 2024-11-03 DIAGNOSIS — N183 Chronic kidney disease, stage 3 unspecified: Secondary | ICD-10-CM | POA: Diagnosis present

## 2024-11-03 DIAGNOSIS — Z8249 Family history of ischemic heart disease and other diseases of the circulatory system: Secondary | ICD-10-CM

## 2024-11-03 DIAGNOSIS — F17290 Nicotine dependence, other tobacco product, uncomplicated: Secondary | ICD-10-CM | POA: Diagnosis present

## 2024-11-03 DIAGNOSIS — Z87891 Personal history of nicotine dependence: Secondary | ICD-10-CM | POA: Diagnosis not present

## 2024-11-03 DIAGNOSIS — N1831 Chronic kidney disease, stage 3a: Secondary | ICD-10-CM | POA: Diagnosis present

## 2024-11-03 DIAGNOSIS — I25119 Atherosclerotic heart disease of native coronary artery with unspecified angina pectoris: Secondary | ICD-10-CM | POA: Diagnosis present

## 2024-11-03 DIAGNOSIS — E669 Obesity, unspecified: Secondary | ICD-10-CM | POA: Diagnosis present

## 2024-11-03 DIAGNOSIS — K319 Disease of stomach and duodenum, unspecified: Secondary | ICD-10-CM

## 2024-11-03 DIAGNOSIS — Z951 Presence of aortocoronary bypass graft: Secondary | ICD-10-CM

## 2024-11-03 DIAGNOSIS — I252 Old myocardial infarction: Secondary | ICD-10-CM | POA: Diagnosis not present

## 2024-11-03 DIAGNOSIS — Z888 Allergy status to other drugs, medicaments and biological substances status: Secondary | ICD-10-CM

## 2024-11-03 DIAGNOSIS — F32A Depression, unspecified: Secondary | ICD-10-CM | POA: Diagnosis present

## 2024-11-03 DIAGNOSIS — Z6830 Body mass index (BMI) 30.0-30.9, adult: Secondary | ICD-10-CM

## 2024-11-03 LAB — COMPREHENSIVE METABOLIC PANEL WITH GFR
ALT: 11 U/L (ref 0–44)
AST: 16 U/L (ref 15–41)
Albumin: 4.3 g/dL (ref 3.5–5.0)
Alkaline Phosphatase: 104 U/L (ref 38–126)
Anion gap: 12 (ref 5–15)
BUN: 20 mg/dL (ref 8–23)
CO2: 22 mmol/L (ref 22–32)
Calcium: 9.2 mg/dL (ref 8.9–10.3)
Chloride: 102 mmol/L (ref 98–111)
Creatinine, Ser: 1.66 mg/dL — ABNORMAL HIGH (ref 0.61–1.24)
GFR, Estimated: 46 mL/min — ABNORMAL LOW
Glucose, Bld: 329 mg/dL — ABNORMAL HIGH (ref 70–99)
Potassium: 5 mmol/L (ref 3.5–5.1)
Sodium: 136 mmol/L (ref 135–145)
Total Bilirubin: <0.2 mg/dL (ref 0.0–1.2)
Total Protein: 8.6 g/dL — ABNORMAL HIGH (ref 6.5–8.1)

## 2024-11-03 LAB — CBC WITH DIFFERENTIAL/PLATELET
Abs Immature Granulocytes: 0.02 K/uL (ref 0.00–0.07)
Basophils Absolute: 0.1 K/uL (ref 0.0–0.1)
Basophils Relative: 1 %
Eosinophils Absolute: 0.2 K/uL (ref 0.0–0.5)
Eosinophils Relative: 4 %
HCT: 27.4 % — ABNORMAL LOW (ref 39.0–52.0)
Hemoglobin: 8 g/dL — ABNORMAL LOW (ref 13.0–17.0)
Immature Granulocytes: 0 %
Lymphocytes Relative: 22 %
Lymphs Abs: 1.4 K/uL (ref 0.7–4.0)
MCH: 26.8 pg (ref 26.0–34.0)
MCHC: 29.2 g/dL — ABNORMAL LOW (ref 30.0–36.0)
MCV: 91.6 fL (ref 80.0–100.0)
Monocytes Absolute: 0.7 K/uL (ref 0.1–1.0)
Monocytes Relative: 11 %
Neutro Abs: 4 K/uL (ref 1.7–7.7)
Neutrophils Relative %: 62 %
Platelets: 442 K/uL — ABNORMAL HIGH (ref 150–400)
RBC: 2.99 MIL/uL — ABNORMAL LOW (ref 4.22–5.81)
RDW: 16.3 % — ABNORMAL HIGH (ref 11.5–15.5)
WBC: 6.4 K/uL (ref 4.0–10.5)
nRBC: 0 % (ref 0.0–0.2)

## 2024-11-03 LAB — RETICULOCYTES
Immature Retic Fract: 26 % — ABNORMAL HIGH (ref 2.3–15.9)
RBC.: 2.86 MIL/uL — ABNORMAL LOW (ref 4.22–5.81)
Retic Count, Absolute: 71.8 K/uL (ref 19.0–186.0)
Retic Ct Pct: 2.5 % (ref 0.4–3.1)

## 2024-11-03 LAB — TYPE AND SCREEN
ABO/RH(D): A POS
Antibody Screen: NEGATIVE

## 2024-11-03 LAB — CBG MONITORING, ED: Glucose-Capillary: 236 mg/dL — ABNORMAL HIGH (ref 70–99)

## 2024-11-03 MED ORDER — EZETIMIBE 10 MG PO TABS
10.0000 mg | ORAL_TABLET | Freq: Every day | ORAL | Status: DC
  Administered 2024-11-04 – 2024-11-06 (×3): 10 mg via ORAL
  Filled 2024-11-03 (×3): qty 1

## 2024-11-03 MED ORDER — ROSUVASTATIN CALCIUM 20 MG PO TABS
40.0000 mg | ORAL_TABLET | Freq: Every day | ORAL | Status: DC
  Administered 2024-11-04 – 2024-11-06 (×3): 40 mg via ORAL
  Filled 2024-11-03 (×3): qty 2

## 2024-11-03 MED ORDER — SODIUM CHLORIDE 0.9% FLUSH
3.0000 mL | Freq: Two times a day (BID) | INTRAVENOUS | Status: DC
  Administered 2024-11-03 – 2024-11-06 (×6): 3 mL via INTRAVENOUS

## 2024-11-03 MED ORDER — CARVEDILOL 3.125 MG PO TABS
3.1250 mg | ORAL_TABLET | Freq: Two times a day (BID) | ORAL | Status: DC
  Administered 2024-11-04 – 2024-11-05 (×4): 3.125 mg via ORAL
  Filled 2024-11-03 (×5): qty 1

## 2024-11-03 MED ORDER — ACETAMINOPHEN 650 MG RE SUPP: 650.0000 mg | Freq: Four times a day (QID) | RECTAL | Status: DC | PRN

## 2024-11-03 MED ORDER — SODIUM CHLORIDE 0.9 % IV SOLN: 250.0000 mL | INTRAVENOUS | Status: AC | PRN

## 2024-11-03 MED ORDER — ISOSORBIDE MONONITRATE ER 30 MG PO TB24
30.0000 mg | ORAL_TABLET | Freq: Every day | ORAL | Status: DC
  Administered 2024-11-04 – 2024-11-06 (×3): 30 mg via ORAL
  Filled 2024-11-03 (×3): qty 1

## 2024-11-03 MED ORDER — ACETAMINOPHEN 325 MG PO TABS: 650.0000 mg | ORAL_TABLET | Freq: Four times a day (QID) | ORAL | Status: DC | PRN

## 2024-11-03 MED ORDER — INSULIN ASPART 100 UNIT/ML IJ SOLN
0.0000 [IU] | INTRAMUSCULAR | Status: DC
  Administered 2024-11-03: 3 [IU] via SUBCUTANEOUS
  Administered 2024-11-04: 2 [IU] via SUBCUTANEOUS
  Administered 2024-11-04: 3 [IU] via SUBCUTANEOUS
  Administered 2024-11-04: 1 [IU] via SUBCUTANEOUS
  Administered 2024-11-04: 3 [IU] via SUBCUTANEOUS
  Filled 2024-11-03: qty 3
  Filled 2024-11-03: qty 1
  Filled 2024-11-03 (×2): qty 3
  Filled 2024-11-03: qty 2

## 2024-11-03 MED ORDER — PHENOBARBITAL 32.4 MG PO TABS
32.4000 mg | ORAL_TABLET | Freq: Four times a day (QID) | ORAL | Status: DC
  Administered 2024-11-03 – 2024-11-06 (×10): 32.4 mg via ORAL
  Filled 2024-11-03 (×10): qty 1

## 2024-11-03 MED ORDER — SODIUM CHLORIDE 0.9% FLUSH: 3.0000 mL | INTRAVENOUS | Status: DC | PRN

## 2024-11-03 MED ORDER — ONDANSETRON HCL 4 MG/2ML IJ SOLN: 4.0000 mg | Freq: Four times a day (QID) | INTRAMUSCULAR | Status: DC | PRN

## 2024-11-03 MED ORDER — ONDANSETRON HCL 4 MG PO TABS: 4.0000 mg | ORAL_TABLET | Freq: Four times a day (QID) | ORAL | Status: DC | PRN

## 2024-11-03 MED ORDER — PANTOPRAZOLE SODIUM 40 MG IV SOLR
40.0000 mg | Freq: Once | INTRAVENOUS | Status: AC
  Administered 2024-11-03: 40 mg via INTRAVENOUS
  Filled 2024-11-03: qty 10

## 2024-11-03 MED ORDER — HYDROCODONE-ACETAMINOPHEN 5-325 MG PO TABS: 1.0000 | ORAL_TABLET | ORAL | Status: DC | PRN

## 2024-11-03 NOTE — Assessment & Plan Note (Signed)
 Order sliding scale hold p.o. medications

## 2024-11-03 NOTE — Assessment & Plan Note (Signed)
 Monitor CBC transfuse as needed if symptomatic or hemoglobin below 7

## 2024-11-03 NOTE — Assessment & Plan Note (Signed)
 Continue Coreg  3.125 mg bid, zetia  crestor  40 mg po q day

## 2024-11-03 NOTE — Telephone Encounter (Signed)
 Called patient and results given for Echo. Per patient he will reach out to GI provider on GI procedure. Made patient aware to call office for any questions. Patient verbalized an understanding.

## 2024-11-03 NOTE — ED Provider Notes (Signed)
 " [place] EMERGENCY DEPARTMENT AT [facility] Provider Note   CSN: [id number] Arrival date & time: 11/03/24  1416     Patient presents with: Abnormal Lab   [name] is a 64 y.o. male.   64 yo M with a cc of a low blood level.  [name] was sent in by his doctor for admission endoscopy and colonoscopy.  Patient tells me [name] has been doing fine.  Denies chest pain denies difficulty breathing.  [name] does feel like [name] has had dark stools for a while that [name] thought it was due to eating more beets.  [name] had read that beets clean your blood and so had been increasing his dietary amount of beets.   Abnormal Lab      Prior to Admission medications  Medication Sig Start Date End Date Taking? Authorizing Provider  Accu-Chek FastClix Lancets MISC USE TO CHECK BLOOD GLUCOSE DAILY 04/20/24   Norleen Lynwood ORN, MD  ACCU-CHEK GUIDE TEST test strip USE ONCE DAILY TO CHECK BLOOD SUGAR 10/12/24   Norleen Lynwood ORN, MD  albuterol  (VENTOLIN  HFA) 108 (90 Base) MCG/ACT inhaler Inhale 2 puffs into the lungs every 6 (six) hours as needed for wheezing or shortness of breath. 09/03/24   Norleen Lynwood ORN, MD  Blood Glucose Monitoring Suppl DEVI 1 each by Does not apply route daily. May substitute to any manufacturer covered by patient's insurance. Dx E11.65 01/19/24   Norleen Lynwood ORN, MD  carvedilol  (COREG ) 3.125 MG tablet Take 1 tablet (3.125 mg total) by mouth 2 (two) times daily with a meal. 09/03/24   Norleen Lynwood ORN, MD  Cholecalciferol (VITAMIN D3) 50 MCG (2000 UT) capsule Take 1 capsule (2,000 Units total) by mouth daily. 04/11/21   Norleen Lynwood ORN, MD  clopidogrel  (PLAVIX ) 75 MG tablet TAKE 1 TABLET(75 MG) BY MOUTH DAILY 10/01/23   Swinyer, Rosaline HERO, NP  ezetimibe  (ZETIA ) 10 MG tablet Take 1 tablet (10 mg total) by mouth daily. 12/14/23   Norleen Lynwood ORN, MD  furosemide  (LASIX ) 40 MG tablet TAKE 1 TABLET BY MOUTH EVERY DAY AS NEEDED FOR EDEMA(TAKE FOR WEIGHT GAIN OVER 3 POUNDS IN 1 DAY OR LEG SWELLING) Patient  taking differently: Take 40 mg by mouth as needed. TAKE 1 TABLET BY MOUTH EVERY DAY AS NEEDED FOR EDEMA(TAKE FOR WEIGHT GAIN OVER 3 POUNDS IN 1 DAY OR LEG SWELLING) 04/11/21   Norleen Lynwood ORN, MD  glipiZIDE  (GLUCOTROL  XL) 10 MG 24 hr tablet TAKE 2 TABLETS(20 MG) BY MOUTH DAILY WITH BREAKFAST Patient taking differently: 20 mg daily. 08/06/24   Norleen Lynwood ORN, MD  iron  polysaccharides (NU-IRON ) 150 MG capsule Take 1 capsule (150 mg total) by mouth 2 (two) times daily. 09/07/24   Norleen Lynwood ORN, MD  isosorbide  mononitrate (IMDUR ) 30 MG 24 hr tablet TAKE 1/2 TABLET BY MOUTH DAILY 12/14/23   Norleen Lynwood ORN, MD  Lancets Misc. MISC 1 each by Does not apply route daily. May substitute to any manufacturer covered by patient's insurance. Dx E11.65 01/19/24   Norleen Lynwood ORN, MD  lisinopril  (ZESTRIL ) 2.5 MG tablet Take 1 tablet (2.5 mg total) by mouth daily. 12/14/23   Norleen Lynwood ORN, MD  nitroGLYCERIN  (NITROSTAT ) 0.4 MG SL tablet PLACE 1 TABLET UNDER TONGUE EVERY 5 MINUTES AS NEEDED FOR CHEST PAIN. IF NO RELIEF AFTER 3 DOSES, CALL 911 11/03/23   Wonda Sharper, MD  PHENobarbital  (LUMINAL) 32.4 MG tablet TAKE 1 TABLET BY MOUTH FOUR TIMES DAILY 03/26/24   Onita,  Modena, MD  rosuvastatin  (CRESTOR ) 40 MG tablet TAKE 1 TABLET BY MOUTH EVERY DAY 12/14/23   Norleen Lynwood ORN, MD    Allergies: Metformin  and related    Review of Systems  Updated Vital Signs BP (!) 158/89   Pulse 98   Temp 98.3 F (36.8 C)   Resp 16   SpO2 98%   Physical Exam Vitals and nursing note reviewed.  Constitutional:      Appearance: [name] is well-developed.  HENT:     Head: Normocephalic and atraumatic.  Eyes:     Pupils: Pupils are equal, round, and reactive to light.  Neck:     Vascular: No JVD.  Cardiovascular:     Rate and Rhythm: Normal rate and regular rhythm.     Heart sounds: No murmur heard.    No friction rub. No gallop.  Pulmonary:     Effort: No respiratory distress.     Breath sounds: No wheezing.  Abdominal:     General: There  is no distension.     Tenderness: There is no abdominal tenderness. There is no guarding or rebound.  Musculoskeletal:        General: Normal range of motion.     Cervical back: Normal range of motion and neck supple.  Skin:    Coloration: Skin is not pale.     Findings: No rash.  Neurological:     Mental Status: [name] is alert and oriented to person, place, and time.  Psychiatric:        Behavior: Behavior normal.     (all labs ordered are listed, but only abnormal results are displayed) Labs Reviewed  CBC WITH DIFFERENTIAL/PLATELET - Abnormal; Notable for the following components:      Result Value   RBC 2.99 (*)    Hemoglobin 8.0 (*)    HCT 27.4 (*)    MCHC 29.2 (*)    RDW 16.3 (*)    Platelets 442 (*)    All other components within normal limits  COMPREHENSIVE METABOLIC PANEL WITH GFR - Abnormal; Notable for the following components:   Glucose, Bld 329 (*)    Creatinine, Ser 1.66 (*)    Total Protein 8.6 (*)    GFR, Estimated 46 (*)    All other components within normal limits  POC OCCULT BLOOD, ED  TYPE AND SCREEN    EKG: None  Radiology: No results found.   Procedures   Medications Ordered in the ED  pantoprazole  (PROTONIX ) injection 40 mg (has no administration in time range)                                    Medical Decision Making  64 yo M with a chief complaints of a low hemoglobin.  On my record review it looks like the patient's hemoglobin has been low for about a month now.  [name] has not had any real significant change.  There are multiple notes in the system encouraged him to come in for admission for endoscopy and colonoscopy.  The patient is willing to stay.  Secure chat message sent to Dr. Avram, [facility] gastroenterology.  Recommends holding Plavix .  Team will see in the morning.  Will discuss with medicine.  The patients results and plan were reviewed and discussed.   Any x-rays performed were independently reviewed by myself.    Differential diagnosis were considered with the presenting HPI.  Medications  pantoprazole  (  PROTONIX ) injection 40 mg (has no administration in time range)    Vitals:   11/03/24 1436 11/03/24 1800  BP: 126/65 (!) 158/89  Pulse: 85 98  Resp: 16 16  Temp: 98.6 F (37 C) 98.3 F (36.8 C)  TempSrc: Oral   SpO2: 99% 98%    Final diagnoses:  Upper GI bleed    Admission/ observation were discussed with the admitting physician, patient and/or family and they are comfortable with the plan.        Final diagnoses:  Upper GI bleed    ED Discharge Orders     None          Emil Share, DO 11/03/24 2114  "

## 2024-11-03 NOTE — Assessment & Plan Note (Signed)
 Chronic stable continue Crestor  40 mg a day

## 2024-11-03 NOTE — ED Provider Triage Note (Signed)
 Emergency Medicine Provider Triage Evaluation Note  [name] , a 64 y.o. male  was evaluated in triage.  Pt complains of abnormal labs.  Patient reports that he is here after being recommended to arrive due to concerns for hemoglobin at 7.6.  Reports that he was advised that he needs a blood transfusion.  He states that he has scheduled endoscopy and colonoscopy next month with GI.  He does report that he is currently on Plavix  and has been taking his medication as prescribed.  Review of Systems  Positive: As above Negative: As above  Physical Exam  BP 126/65 (BP Location: Right Arm)   Pulse 85   Temp 98.6 F (37 C) (Oral)   Resp 16   SpO2 99%  Gen:   Awake, no distress   Resp:  Normal effort  MSK:   Moves extremities without difficulty  Other:    Medical Decision Making  Medically screening exam initiated at 3:11 PM.  Appropriate orders placed.  [name] was informed that the remainder of the evaluation will be completed by another provider, this initial triage assessment does not replace that evaluation, and the importance of remaining in the ED until their evaluation is complete.     [name] A, PA-C 11/03/24 1512

## 2024-11-03 NOTE — Assessment & Plan Note (Signed)
 Monitor for any symptoms hold Plavix  for now so the patient can proceed to EGD and a colonoscopy in the past has been cleared to hold Plavix  for 5 days

## 2024-11-03 NOTE — Assessment & Plan Note (Signed)
 Restart Coreg  3.125 mg p.o. twice daily

## 2024-11-03 NOTE — Telephone Encounter (Signed)
 The pt did finally call back and he has been advised that he needs to go to the ED. He was hesitant but agrees to go to [facility] ED.  I did explain the importance of going and staying for care.  He says I will see what I can do

## 2024-11-03 NOTE — Assessment & Plan Note (Signed)
-  chronic avoid nephrotoxic medications such as NSAIDs, Vanco Zosyn combo,  avoid hypotension, continue to follow renal function

## 2024-11-03 NOTE — Assessment & Plan Note (Signed)
 In remission

## 2024-11-03 NOTE — Telephone Encounter (Signed)
 Called patient and unable to leave message

## 2024-11-03 NOTE — Assessment & Plan Note (Signed)
"      °  -    ER  Provider spoke to gastroenterology (  [facility]) they will see patient in a.m. appreciate their consult   - serial CBC.    - Monitor for any recurrence,  evidence of hemodynamic instability or significant blood loss  - Transfuse as needed for hemoglobin below 7 or evidence of life-threatening bleeding  - Establish at least 2 PIV and fluid resuscitate   - clear liquids for tonight keep nothing by mouth post midnight,   -  administer Protonix    twice a day     "

## 2024-11-03 NOTE — Assessment & Plan Note (Signed)
Chronic stable avoid fluid overload 

## 2024-11-03 NOTE — Assessment & Plan Note (Signed)
 Chronic stable continue phenobarbital  32.4 mg 4 times daily

## 2024-11-03 NOTE — Subjective & Objective (Signed)
 Patient has known history of iron  deficiency anemia he is known to [facility].  Patient is on Plavix  for history of significant CAD with history of NSTEMI in 2012, STEMI in 2016 with numerous coronary stents up to total of 9 sounds like his last cath was in 2016.  He is on Plavix  Patient has been having significant anemia with hemoglobins ranging between 7.5-8 since late 2025 he has been treated in the past with iron  infusions. In the past was suggested that Plavix  can be held for at least 5 days he was cleared by this by cardiologist in November 2024 and he needs to be admitted for further workup including EGD and colonoscopy. GI has attempted to contact patient to arrange for that but unfortunate was not able to do so until now Patient expressed in the past multiple reservations why he could not stay for the admission and left AMA in the past he also lacked transportation to get to facilities as well  Last colonoscopy was in 2014   by Dr. Debrah: and found Sessile polyp measuring 2 mm in size was found in the rectum, polypectomy was performed with cold forceps Mild diverticulosis was noted in the descending colon The colon mucosa was otherwise normal Path report showed 9 colonic mucosa Recall colonoscopy 10 years  Of note patient does has known history of seizure disorder last seizure was in 2015 he also has known history of systolic CHF with last EF being 40 to 45% December 2025 with severe MR  Today his hemoglobin is 7.6 [facility] has been consulted recommend holding Plavix  will see him in consult and attempt to perform EGD/colonoscopy

## 2024-11-03 NOTE — H&P (Addendum)
 "    [name] FMW:[id number] DOB: 08-26-1961 DOA: 11/03/2024     PCP: Norleen Lynwood ORN, MD   Outpatient Specialists:  CARDS:  Dr. Ozell Fell, MD    GI  Dr. Marinda ( [facility])    Patient arrived to ER on 11/03/24 at 1416 Referred by Attending Emil Share, DO   Patient coming from:    home Lives alone,      Chief Complaint:   Chief Complaint  Patient presents with   Abnormal Lab    HPI: [name] is a 64 y.o. male with medical history significant of CAD status post stenting on Plavix , DM2, history of seizure disorder, tobacco abuse, iron  deficiency anemia, systolic CHF, mitral regurgitation    Presented with   was told to go to emergency department secondary to low hemoglobin Patient has known history of iron  deficiency anemia he is known to [facility].  Patient is on Plavix  for history of significant CAD with history of NSTEMI in 2012, STEMI in 2016 with numerous coronary stents up to total of 9 sounds like his last cath was in 2016.  He is on Plavix  Patient has been having significant anemia with hemoglobins ranging between 7.5-8 since late 2025 he has been treated in the past with iron  infusions. In the past was suggested that Plavix  can be held for at least 5 days he was cleared by this by cardiologist in November 2024 and he needs to be admitted for further workup including EGD and colonoscopy. GI has attempted to contact patient to arrange for that but unfortunate was not able to do so until now Patient expressed in the past multiple reservations why he could not stay for the admission and left AMA in the past he also lacked transportation to get to facilities as well  Last colonoscopy was in 2014   by Dr. Debrah: and found Sessile polyp measuring 2 mm in size was found in the rectum, polypectomy was performed with cold forceps Mild diverticulosis was noted in the descending colon The colon mucosa was otherwise normal Path report showed 9 colonic mucosa Recall colonoscopy 10  years  Of note patient does has known history of seizure disorder last seizure was in 2015 he also has known history of systolic CHF with last EF being 40 to 45% December 2025 with severe MR  Today his hemoglobin is 7.6 [facility] has been consulted recommend holding Plavix  will see him in consult and attempt to perform EGD/colonoscopy    Denies any chest pain reports he only gets short of breath when he has to walk up the hill he usually does not get much exercise, states he has dark stools but that is because he has been eating a lot of beets but no bright red blood in stools no vomiting No fevers no chills no cough  Patient has been getting regular iron  infusions and was supposed to get an infusion tomorrow  Denies significant ETOH intake   Does not smoke     Regarding pertinent Chronic problems:     Hyperlipidemia - on statins Cresto Lipid Panel     Component Value Date/Time   CHOL 108 09/06/2024 1420   CHOL 135 04/16/2024 1557   TRIG 105.0 09/06/2024 1420   HDL 31.90 (L) 09/06/2024 1420   HDL 38 (L) 04/16/2024 1557   CHOLHDL 3 09/06/2024 1420   VLDL 21.0 09/06/2024 1420   LDLCALC 56 09/06/2024 1420   LDLCALC 79 04/16/2024 1557   LDLCALC 74  04/22/2022 1641   LABVLDL 18 04/16/2024 1557     HTN on Coreg  Imdur  Lasix    chronic CHF diastolic/systolic/ combined - last echo  Recent Results (from the past 56199 hours)  ECHOCARDIOGRAM COMPLETE   Collection Time: 10/19/24  4:11 PM  Result Value   Area-P 1/2 5.93   S' Lateral 5.40   P 1/2 time 970   Radius 1.00   MV M vel 5.17   MV Peak grad 106.9   Est EF 40 - 45%   Narrative      ECHOCARDIOGRAM REPORT          1. Left ventricular ejection fraction, by estimation, is 40 to 45%. The left ventricle has mildly decreased function. The left ventricle demonstrates regional wall motion abnormalities (see scoring diagram/findings for description). The left ventricular  internal cavity size was severely dilated. There is moderate  concentric left ventricular hypertrophy. Left ventricular diastolic parameters are consistent with Grade II diastolic dysfunction (pseudonormalization). There is hypokinesis of the left  ventricular, entire anterolateral wall and inferolateral wall.  2. Right ventricular systolic function is normal. The right ventricular size is normal. There is normal pulmonary artery systolic pressure.  3. Left atrial size was moderately dilated.  4. The mitral valve is normal in structure. Severe mitral valve regurgitation. No evidence of mitral stenosis.  5. The aortic valve is tricuspid. Aortic valve regurgitation is mild to moderate. No aortic stenosis is present.  6. Aortic dilatation noted. There is mild dilatation of the ascending aorta, measuring 43 mm.  7. The inferior vena cava is normal in size with greater than 50% respiratory variability, suggesting right atrial pressure of 3 mmHg.  Comparison(s): Changes from prior study are noted. LV is more dilated compared to prior. Mitral regurgitation is now severe, functional due to LV dilation.       Final           CAD  - On   statin, betablocker, Plavix                  -  followed by cardiology                - last cardiac cath 2016        DM 2 -  Lab Results  Component Value Date   HGBA1C 6.7 (H) 09/06/2024    on PO meds only, has not been compliant     CKD stage IIIb   baseline Cr 1.4 Estimated Creatinine Clearance: 60.6 mL/min (A) (by C-G formula based on SCr of 1.66 mg/dL (H)).  Lab Results  Component Value Date   CREATININE 1.66 (H) 11/03/2024   CREATININE 1.60 (H) 10/28/2024   CREATININE 1.45 09/06/2024   Lab Results  Component Value Date   NA 136 11/03/2024   CL 102 11/03/2024   K 5.0 11/03/2024   CO2 22 11/03/2024   BUN 20 11/03/2024   CREATININE 1.66 (H) 11/03/2024   GFRNONAA 46 (L) 11/03/2024   CALCIUM  9.2 11/03/2024   ALBUMIN 4.3 11/03/2024   GLUCOSE 329 (H) 11/03/2024      Chronic anemia - baseline hg Hemoglobin  & Hematocrit  Recent Labs    09/30/24 1547 10/28/24 1551 11/03/24 1529  HGB 7.5 Repeated and verified X2.* 7.7 Repeated and verified X2.* 8.0*   Iron /TIBC/Ferritin/ %Sat    Component Value Date/Time   IRON  54 10/28/2024 1551   IRON  28 (L) 12/04/2016 1219   TIBC 445.2 10/28/2024 1551   TIBC 323 12/04/2016 1219  FERRITIN 5.2 (L) 10/28/2024 1551   FERRITIN 16 (L) 12/04/2016 1219   IRONPCTSAT 12.1 (L) 10/28/2024 1551   IRONPCTSAT 13 (L) 10/30/2018 1546     Seizure DO - las seizure 2019 currently on phenobarbital       While in ER:   Hemoglobin 8.0 patient agreeable to stay ER provider sent message to [facility] They recommend clear diet will see in the morning    Lab Orders         CBC with Differential         Comprehensive metabolic panel         POC occult blood, ED Provider will collect       Following Medications were ordered in ER: Medications  pantoprazole  (PROTONIX ) injection 40 mg (has no administration in time range)    _______________________________________________________ ER Provider Called:     [facility]  Dr Avram They Recommend admit to medicine   Will see in AM        ED Triage Vitals [11/03/24 1436]  Encounter Vitals Group     BP 126/65     Girls Systolic BP Percentile      Girls Diastolic BP Percentile      Boys Systolic BP Percentile      Boys Diastolic BP Percentile      Pulse Rate 85     Resp 16     Temp 98.6 F (37 C)     Temp Source Oral     SpO2 99 %     Weight      Height      Head Circumference      Peak Flow      Pain Score      Pain Loc      Pain Education      Exclude from Growth Chart   TMAX(24)@     _________________________________________ Significant initial  Findings: Abnormal Labs Reviewed  CBC WITH DIFFERENTIAL/PLATELET - Abnormal; Notable for the following components:      Result Value   RBC 2.99 (*)    Hemoglobin 8.0 (*)    HCT 27.4 (*)    MCHC 29.2 (*)    RDW 16.3 (*)    Platelets 442 (*)    All other  components within normal limits  COMPREHENSIVE METABOLIC PANEL WITH GFR - Abnormal; Notable for the following components:   Glucose, Bld 329 (*)    Creatinine, Ser 1.66 (*)    Total Protein 8.6 (*)    GFR, Estimated 46 (*)    All other components within normal limits       ECG: Ordered   BNP (last 3 results) Recent Labs    08/24/24 1909  BNP 42.7       The recent clinical data is shown below. Vitals:   11/03/24 1436 11/03/24 1800  BP: 126/65 (!) 158/89  Pulse: 85 98  Resp: 16 16  Temp: 98.6 F (37 C) 98.3 F (36.8 C)  TempSrc: Oral   SpO2: 99% 98%    WBC     Component Value Date/Time   WBC 6.4 11/03/2024 1529   LYMPHSABS 1.4 11/03/2024 1529   LYMPHSABS 2.4 01/19/2018 1505   MONOABS 0.7 11/03/2024 1529   EOSABS 0.2 11/03/2024 1529   EOSABS 0.3 01/19/2018 1505   BASOSABS 0.1 11/03/2024 1529   BASOSABS 0.0 01/19/2018 1505          __________________________________________________ Recent Labs  Lab 10/28/24 1551 11/03/24 1529  NA 133* 136  K 4.8  5.0  CO2 24 22  GLUCOSE 457* 329*  BUN 27* 20  CREATININE 1.60* 1.66*  CALCIUM  8.5 9.2    Cr Up from baseline see below Lab Results  Component Value Date   CREATININE 1.66 (H) 11/03/2024   CREATININE 1.60 (H) 10/28/2024   CREATININE 1.45 09/06/2024    Recent Labs  Lab 10/28/24 1551 11/03/24 1529  AST 10 16  ALT 10 11  ALKPHOS 75 104  BILITOT 0.1* <0.2  PROT 7.8 8.6*  ALBUMIN 4.0 4.3   Lab Results  Component Value Date   CALCIUM  9.2 11/03/2024    Plt: Lab Results  Component Value Date   PLT 442 (H) 11/03/2024      Recent Labs  Lab 10/28/24 1551 11/03/24 1529  WBC 5.7 6.4  NEUTROABS 3.4 4.0  HGB 7.7 Repeated and verified X2.* 8.0*  HCT 23.8 Repeated and verified X2.* 27.4*  MCV 85.5 91.6  PLT 459.0* 442*    HG/HCT  down      Component Value Date/Time   HGB 8.0 (L) 11/03/2024 1529   HGB 10.0 (L) 01/19/2018 1505   HCT 27.4 (L) 11/03/2024 1529   HCT 32.4 (L) 12/31/2018 1437    MCV 91.6 11/03/2024 1529   MCV 86 01/19/2018 1505     _______________________________________________ Hospitalist was called for admission for   Upper GI bleed ?   The following Work up has been ordered so far:  Orders Placed This Encounter  Procedures   CBC with Differential   Comprehensive metabolic panel   Consult to hospitalist   POC occult blood, ED Provider will collect   Type and screen [facility]     OTHER Significant initial  Findings:  labs showing:     DM  labs:  HbA1C: Recent Labs    11/28/23 1949 12/12/23 1725 09/06/24 1420  HGBA1C >15.5* >14.0* 6.7*       CBG (last 3)  No results for input(s): GLUCAP in the last 72 hours.        Cultures:    Component Value Date/Time   SDES URINE, RANDOM 02/04/2009 0636   SPECREQUEST NONE 02/04/2009 0636   CULT NO GROWTH 02/04/2009 0636   REPTSTATUS 02/05/2009 FINAL 02/04/2009 0636     Radiological Exams on Admission: No results found. _______________________________________________________________________________________________________ Latest  Blood pressure (!) 158/89, pulse 98, temperature 98.3 F (36.8 C), resp. rate 16, SpO2 98%.   Vitals  labs and radiology finding personally reviewed  Review of Systems:    Pertinent positives include:  black stools?  Constitutional:  No weight loss, night sweats, Fevers, chills, fatigue, weight loss  HEENT:  No headaches, Difficulty swallowing,Tooth/dental problems,Sore throat,  No sneezing, itching, ear ache, nasal congestion, post nasal drip,  Cardio-vascular:  No chest pain, Orthopnea, PND, anasarca, dizziness, palpitations.no Bilateral lower extremity swelling  GI:  No heartburn, indigestion, abdominal pain, nausea, vomiting, diarrhea, change in bowel habits, loss of appetite, melena, blood in stool, hematemesis Resp:  no shortness of breath at rest. No dyspnea on exertion, No excess mucus, no productive cough, No non-productive  cough, No coughing up of blood.No change in color of mucus.No wheezing. Skin:  no rash or lesions. No jaundice GU:  no dysuria, change in color of urine, no urgency or frequency. No straining to urinate.  No flank pain.  Musculoskeletal:  No joint pain or no joint swelling. No decreased range of motion. No back pain.  Psych:  No change in mood or affect. No depression or anxiety.  No memory loss.  Neuro: no localizing neurological complaints, no tingling, no weakness, no double vision, no gait abnormality, no slurred speech, no confusion  All systems reviewed and apart from HOPI all are negative _______________________________________________________________________________________________ Past Medical History:   Past Medical History:  Diagnosis Date   Anemia 01/01/2017   Ascending aorta dilation    Echocardiogram 2/22: EF 40-45, mild LVH, normal RVSF, mild to mod MR, mild to mod AI, ascending aorta 45 mm.     CAD (coronary artery disease)    a. Overlapping BMS-prox LCx in 2009 b. NSTEMI 2012 due to prox LCx ISR s/p PTCA alone; EF 45% w/ basal inf HK. Had run out of Plavix  1 week prior. c. NSTEMI 05/2015 after running out of Plavix  - cath with 3V CAD, Patient left AMA prior to agreeing to CABG. // d. LHC 8/16: LM 60; LAD mid 60, D1 75; RI 50, 80; LCx prox stent 100; RCA prox 100; EF 45-50 >> seen by [facility] - declined CABG >> Med Rx.   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   Hypertension    Iron  deficiency 09/30/2024   MI (myocardial infarction) (HCC) x 7   Poor compliance    Seizure disorder (HCC)    Seizures (HCC)    TOBACCO ABUSE 02/22/2009   Valvular heart disease    a. Echo 05/2015: mild AI, mod MR.      Past Surgical History:  Procedure Laterality Date   CARDIAC CATHETERIZATION [name] 05/29/2015   Procedure: Left Heart Cath and Coronary Angiography;  Surgeon: Victory LELON Sharps, MD;  Location: [facility] INVASIVE CV LAB;  Service: Cardiovascular;  Laterality: [name];   COLONOSCOPY     CORONARY ANGIOPLASTY  03/2011   PTCA-prox LCx ISR   CORONARY ANGIOPLASTY WITH STENT PLACEMENT  10/2007   BMS-prox LCx   LEFT HEART CATHETERIZATION WITH CORONARY ANGIOGRAM [name] 10/13/2013   Procedure: LEFT HEART CATHETERIZATION WITH CORONARY ANGIOGRAM;  Surgeon: Ozell JONETTA Fell, MD;  Location[facility] CATH LAB;  Service: Cardiovascular;  Laterality: [name];   TEE WITHOUT CARDIOVERSION [name] 06/16/2015   Procedure: TRANSESOPHAGEAL ECHOCARDIOGRAM (TEE);  Surgeon: Maude JAYSON Emmer, MD;  Location: [facility] ENDOSCOPY;  Service: Cardiovascular;  Laterality: [name];    Social History:  Ambulatory   independently      reports that he has quit smoking. His smoking use included cigars. He has never used smokeless tobacco. He reports that he does not drink alcohol and does not use drugs.     Family History:   Family History  Problem Relation Age of Onset   Hypertension Brother    Atrial fibrillation Brother    Coronary artery disease Maternal Aunt    Heart disease Maternal Uncle    Stroke Maternal Uncle    Heart attack Maternal Uncle        X3   Hypertension Other  Colon cancer Neg Hx    Stomach cancer Neg Hx    Rectal cancer Neg Hx    Pancreatic cancer Neg Hx    ______________________________________________________________________________________________ Allergies: Allergies[1]   Prior to Admission medications  Medication Sig Start Date End Date Taking? Authorizing Provider  Accu-Chek  FastClix Lancets MISC USE TO CHECK BLOOD GLUCOSE DAILY 04/20/24   Norleen Lynwood ORN, MD  ACCU-CHEK GUIDE TEST test strip USE ONCE DAILY TO CHECK BLOOD SUGAR 10/12/24   Norleen Lynwood ORN, MD  albuterol  (VENTOLIN  HFA) 108 (90 Base) MCG/ACT inhaler Inhale 2 puffs into the lungs every 6 (six) hours as needed for wheezing or shortness of breath. 09/03/24   Norleen Lynwood ORN, MD  Blood Glucose Monitoring Suppl DEVI 1 each by Does not apply route daily. May substitute to any manufacturer covered by patient's insurance. Dx E11.65 01/19/24   Norleen Lynwood ORN, MD  carvedilol  (COREG ) 3.125 MG tablet Take 1 tablet (3.125 mg total) by mouth 2 (two) times daily with a meal. 09/03/24   Norleen Lynwood ORN, MD  Cholecalciferol (VITAMIN D3) 50 MCG (2000 UT) capsule Take 1 capsule (2,000 Units total) by mouth daily. 04/11/21   Norleen Lynwood ORN, MD  clopidogrel  (PLAVIX ) 75 MG tablet TAKE 1 TABLET(75 MG) BY MOUTH DAILY 10/01/23   Swinyer, Rosaline HERO, NP  ezetimibe  (ZETIA ) 10 MG tablet Take 1 tablet (10 mg total) by mouth daily. 12/14/23   Norleen Lynwood ORN, MD  furosemide  (LASIX ) 40 MG tablet TAKE 1 TABLET BY MOUTH EVERY DAY AS NEEDED FOR EDEMA(TAKE FOR WEIGHT GAIN OVER 3 POUNDS IN 1 DAY OR LEG SWELLING) Patient taking differently: Take 40 mg by mouth as needed. TAKE 1 TABLET BY MOUTH EVERY DAY AS NEEDED FOR EDEMA(TAKE FOR WEIGHT GAIN OVER 3 POUNDS IN 1 DAY OR LEG SWELLING) 04/11/21   Norleen Lynwood ORN, MD  glipiZIDE  (GLUCOTROL  XL) 10 MG 24 hr tablet TAKE 2 TABLETS(20 MG) BY MOUTH DAILY WITH BREAKFAST Patient taking differently: 20 mg daily. 08/06/24   Norleen Lynwood ORN, MD  iron  polysaccharides (NU-IRON ) 150 MG capsule Take 1 capsule (150 mg total) by mouth 2 (two) times daily. 09/07/24   Norleen Lynwood ORN, MD  isosorbide  mononitrate (IMDUR ) 30 MG 24 hr tablet TAKE 1/2 TABLET BY MOUTH DAILY 12/14/23   Norleen Lynwood ORN, MD  Lancets Misc. MISC 1 each by Does not apply route daily. May substitute to any manufacturer covered by patient's insurance. Dx E11.65 01/19/24   Norleen Lynwood ORN, MD  lisinopril  (ZESTRIL ) 2.5 MG tablet Take 1 tablet (2.5 mg total) by mouth daily. 12/14/23   Norleen Lynwood ORN, MD  nitroGLYCERIN  (NITROSTAT ) 0.4 MG SL tablet PLACE 1 TABLET UNDER TONGUE EVERY 5 MINUTES AS NEEDED FOR CHEST PAIN. IF NO RELIEF AFTER 3 DOSES, CALL 911 11/03/23   Wonda Sharper, MD  PHENobarbital  (LUMINAL) 32.4 MG tablet TAKE 1 TABLET BY MOUTH FOUR TIMES DAILY 03/26/24   Onita Duos, MD  rosuvastatin  (CRESTOR ) 40 MG tablet TAKE 1 TABLET BY MOUTH EVERY DAY 12/14/23   Norleen Lynwood ORN, MD    ___________________________________________________________________________________________________ Physical Exam:    11/03/2024    6:00 PM 11/03/2024    2:36 PM 10/28/2024    2:59 PM  Vitals with BMI  Height   6' 2  Weight   240 lbs  BMI   30.8  Systolic 158 126 897  Diastolic 89 65 56  Pulse 98 85 86     1. General:  in No  Acute distress   Chronically  ill   -appearing 2. Psychological: Alert and   Oriented 3. Head/ENT:    Dry Mucous Membranes                          Head Non traumatic, neck supple                          Poor Dentition 4. SKIN:  decreased Skin turgor,  Skin clean Dry and intact no rash    5. Heart: Regular rate and rhythm no  Murmur, no Rub or gallop 6. Lungs:   no wheezes or crackles   7. Abdomen: Soft,  non-tender,  distended   obese  bowel sounds present 8. Lower extremities: no clubbing, cyanosis, no   edema 9. Neurologically Grossly intact, moving all 4 extremities equally  10. MSK: Normal range of motion    Chart has been reviewed  ______________________________________________________________________________________________  Assessment/Plan  64 y.o. male with medical history significant of CAD status post stenting on Plavix , DM2, history of seizure disorder, tobacco abuse, iron  deficiency anemia, systolic CHF, mitral regurgitation   Admitted for   Upper GI bleed    Present on Admission:  Iron  deficiency anemia  Chronic combined systolic and  diastolic CHF (congestive heart failure) (HCC)  CKD (chronic kidney disease) stage 3, GFR 30-59 ml/min (HCC)  Coronary artery disease involving native coronary artery of native heart with angina pectoris  Diabetes mellitus with hyperglycemia (HCC)  Pure hypercholesterolemia  CAD (coronary artery disease)  Essential hypertension  Upper GI bleed    Chronic combined systolic and diastolic CHF (congestive heart failure) (HCC) Chronic stable avoid fluid overload  CKD (chronic kidney disease) stage 3, GFR 30-59 ml/min (HCC)  -chronic avoid nephrotoxic medications such as NSAIDs, Vanco Zosyn combo,  avoid hypotension, continue to follow renal function   Coronary artery disease involving native coronary artery of native heart with angina pectoris Monitor for any symptoms hold Plavix  for now so the patient can proceed to EGD and a colonoscopy in the past has been cleared to hold Plavix  for 5 days  Diabetes mellitus with hyperglycemia (HCC) Order sliding scale hold p.o. medications  Iron  deficiency anemia Monitor CBC transfuse as needed if symptomatic or hemoglobin below 7  Pure hypercholesterolemia Chronic stable continue Crestor  40 mg a day  Seizure disorder (HCC) Chronic stable continue phenobarbital  32.4 mg 4 times daily  CAD (coronary artery disease) Continue Coreg  3.125 mg bid, zetia  crestor  40 mg po q day  Former smoker In remission  Essential hypertension Restart Coreg  3.125 mg p.o. twice daily   Other plan as per orders.  DVT prophylaxis:  SCD        Code Status:    Code Status: Prior FULL CODE  as per patient   I had personally discussed CODE STATUS with patient   ACP   none   Family Communication:   Family not at  Bedside    Diet clear   Disposition Plan:         To home once workup is complete and patient is stable   Following barriers for discharge:                                                      Electrolytes corrected  Anemia corrected                                                         Will need consultants to evaluate patient prior to discharge                                 Consult Orders  (From admission, onward)           Start     Ordered   11/03/24 2029  Consult to hospitalist  Once       Provider:  (Not yet assigned)  Question Answer Comment  Place call to: [facility]   Reason for Consult Admit      11/03/24 2028                               Consults called: [facility] Treatment Team:  Albertus Gordy HERO, MD  Admission status:  ED Disposition     ED Disposition  Admit   Condition  --   Comment  Hospital [facility]: [facility] [[id number]]  Level of Care: Telemetry [5]  Admit to tele based on following criteria: Other see comments  Comments: anemia  May admit patient to [facility] or [facility] if equivalent level of care is available:: No  Diagnosis: Iron  deficiency anemia [213315]  Admitting Physician: Fabiola Mudgett [[id number]]  Attending Physician: Milla Wahlberg [[id number]]  Certification:: I certify this patient will need inpatient services for at least 2 midnights  Expected Medical Readiness: 11/05/2024            inpatient     I Expect 2 midnight stay secondary to severity of patient's current illness need for inpatient interventions justified by the following:   Severe lab/radiological/exam abnormalities including:    Upper GI bleed    and extensive comorbidities including: DM2    CHF  CAD CKD  That are currently affecting medical management.   I expect  patient to be hospitalized for 2 midnights requiring inpatient medical care.  Patient is at high risk for adverse outcome (such as loss of life or disability) if not treated.  Indication for inpatient stay as follows:   Need for operative/procedural  intervention    Need for IV PPI Frequent labs    Level of care     tele  For   24H     Gretna Bergin 11/03/2024,  11:52 PM    [facility]     after 2 AM please page floor coverage   If 7AM-7PM, please contact the day team taking care of the patient using [email or web address]        [1]  Allergies Allergen Reactions   Metformin  And Related Diarrhea    GI Upset (vomiting, diarrhea)   "

## 2024-11-03 NOTE — ED Triage Notes (Signed)
 Pt arrived via POV, states he was sent to ED for blood transfusion. Hemoglobin was 7.6. Denies any SOB, dizziness or any other issues

## 2024-11-04 ENCOUNTER — Ambulatory Visit

## 2024-11-04 DIAGNOSIS — Z7902 Long term (current) use of antithrombotics/antiplatelets: Secondary | ICD-10-CM

## 2024-11-04 DIAGNOSIS — I2510 Atherosclerotic heart disease of native coronary artery without angina pectoris: Secondary | ICD-10-CM

## 2024-11-04 DIAGNOSIS — I5042 Chronic combined systolic (congestive) and diastolic (congestive) heart failure: Secondary | ICD-10-CM | POA: Diagnosis not present

## 2024-11-04 DIAGNOSIS — D509 Iron deficiency anemia, unspecified: Secondary | ICD-10-CM | POA: Diagnosis not present

## 2024-11-04 LAB — COMPREHENSIVE METABOLIC PANEL WITH GFR
ALT: 9 U/L (ref 0–44)
AST: 18 U/L (ref 15–41)
Albumin: 4.2 g/dL (ref 3.5–5.0)
Alkaline Phosphatase: 98 U/L (ref 38–126)
Anion gap: 11 (ref 5–15)
BUN: 19 mg/dL (ref 8–23)
CO2: 23 mmol/L (ref 22–32)
Calcium: 9.7 mg/dL (ref 8.9–10.3)
Chloride: 105 mmol/L (ref 98–111)
Creatinine, Ser: 1.48 mg/dL — ABNORMAL HIGH (ref 0.61–1.24)
GFR, Estimated: 53 mL/min — ABNORMAL LOW
Glucose, Bld: 169 mg/dL — ABNORMAL HIGH (ref 70–99)
Potassium: 4.3 mmol/L (ref 3.5–5.1)
Sodium: 139 mmol/L (ref 135–145)
Total Bilirubin: <0.2 mg/dL (ref 0.0–1.2)
Total Protein: 8.1 g/dL (ref 6.5–8.1)

## 2024-11-04 LAB — CBC
HCT: 25.2 % — ABNORMAL LOW (ref 39.0–52.0)
Hemoglobin: 7.7 g/dL — ABNORMAL LOW (ref 13.0–17.0)
MCH: 27.4 pg (ref 26.0–34.0)
MCHC: 30.6 g/dL (ref 30.0–36.0)
MCV: 89.7 fL (ref 80.0–100.0)
Platelets: 438 K/uL — ABNORMAL HIGH (ref 150–400)
RBC: 2.81 MIL/uL — ABNORMAL LOW (ref 4.22–5.81)
RDW: 16.3 % — ABNORMAL HIGH (ref 11.5–15.5)
WBC: 6.3 K/uL (ref 4.0–10.5)
nRBC: 0 % (ref 0.0–0.2)

## 2024-11-04 LAB — IRON AND TIBC
Iron: 25 ug/dL — ABNORMAL LOW (ref 45–182)
Saturation Ratios: 5 % — ABNORMAL LOW (ref 17.9–39.5)
TIBC: 459 ug/dL — ABNORMAL HIGH (ref 250–450)
UIBC: 435 ug/dL

## 2024-11-04 LAB — FOLATE: Folate: 8.5 ng/mL

## 2024-11-04 LAB — FERRITIN: Ferritin: 13 ng/mL — ABNORMAL LOW (ref 24–336)

## 2024-11-04 LAB — PHENOBARBITAL LEVEL: Phenobarbital: 19.7 ug/mL (ref 15.0–40.0)

## 2024-11-04 LAB — CBG MONITORING, ED
Glucose-Capillary: 232 mg/dL — ABNORMAL HIGH (ref 70–99)
Glucose-Capillary: 155 mg/dL — ABNORMAL HIGH (ref 70–99)
Glucose-Capillary: 146 mg/dL — ABNORMAL HIGH (ref 70–99)
Glucose-Capillary: 152 mg/dL — ABNORMAL HIGH (ref 70–99)
Glucose-Capillary: 243 mg/dL — ABNORMAL HIGH (ref 70–99)
Glucose-Capillary: 215 mg/dL — ABNORMAL HIGH (ref 70–99)

## 2024-11-04 LAB — VITAMIN B12: Vitamin B-12: 313 pg/mL (ref 180–914)

## 2024-11-04 LAB — GLUCOSE, CAPILLARY
Glucose-Capillary: 209 mg/dL — ABNORMAL HIGH (ref 70–99)
Glucose-Capillary: 410 mg/dL — ABNORMAL HIGH (ref 70–99)
Glucose-Capillary: 357 mg/dL — ABNORMAL HIGH (ref 70–99)

## 2024-11-04 LAB — PHOSPHORUS: Phosphorus: 3.8 mg/dL (ref 2.5–4.6)

## 2024-11-04 LAB — HIV ANTIBODY (ROUTINE TESTING W REFLEX): HIV Screen 4th Generation wRfx: NONREACTIVE

## 2024-11-04 LAB — MAGNESIUM: Magnesium: 2.5 mg/dL — ABNORMAL HIGH (ref 1.7–2.4)

## 2024-11-04 MED ORDER — INSULIN ASPART 100 UNIT/ML IJ SOLN
10.0000 [IU] | Freq: Once | INTRAMUSCULAR | Status: AC
  Administered 2024-11-04: 10 [IU] via SUBCUTANEOUS
  Filled 2024-11-04: qty 10

## 2024-11-04 MED ORDER — INSULIN ASPART 100 UNIT/ML IJ SOLN
0.0000 [IU] | INTRAMUSCULAR | Status: DC
  Administered 2024-11-05: 3 [IU] via SUBCUTANEOUS
  Administered 2024-11-05: 1 [IU] via SUBCUTANEOUS
  Administered 2024-11-05: 5 [IU] via SUBCUTANEOUS
  Administered 2024-11-05: 1 [IU] via SUBCUTANEOUS
  Administered 2024-11-05: 7 [IU] via SUBCUTANEOUS
  Administered 2024-11-05: 3 [IU] via SUBCUTANEOUS
  Administered 2024-11-06 (×2): 1 [IU] via SUBCUTANEOUS
  Filled 2024-11-04 (×3): qty 1
  Filled 2024-11-04: qty 5
  Filled 2024-11-04: qty 2
  Filled 2024-11-04: qty 1
  Filled 2024-11-04: qty 7
  Filled 2024-11-04: qty 2
  Filled 2024-11-04: qty 3

## 2024-11-04 MED ORDER — SODIUM CHLORIDE 0.9 % IV SOLN
125.0000 mg | Freq: Once | INTRAVENOUS | Status: AC
  Administered 2024-11-04: 125 mg via INTRAVENOUS
  Filled 2024-11-04: qty 10

## 2024-11-04 NOTE — Progress Notes (Signed)
 " PROGRESS NOTE    [name]  FMW:[id number] DOB: 1961/03/12 DOA: 11/03/2024 PCP: Norleen Lynwood ORN, MD    Brief Narrative:   64 y.o. male with medical history significant of CAD status post stenting on Plavix , DM2, history of seizure disorder, tobacco abuse, iron  deficiency anemia, systolic CHF, mitral regurgitation.  Admitted for Upper GI bleed?  Assessment and Plan: Anemia-- ? From chronic GI bleeding - Patient instructed to come to the ER by GI - GI consult placed -Will hold on transfusion for now since patient is 7.7 - Patient due for IV iron  so will dose today  Chronic combined systolic and diastolic CHF (congestive heart failure) (HCC) with severe mitral regurg Chronic stable avoid fluid overload -Follows with Dr. Lorelle patient no plan for intervention as long as he is asymptomatic   CKD (chronic kidney disease) stage 3, GFR 30-59 ml/min (HCC)  - Daily labs   Coronary artery disease involving native coronary artery of native heart with angina pectoris -hold Plavix  for now so the patient can proceed to EGD and a colonoscopy if indicated by GI   Diabetes mellitus with hyperglycemia (HCC) -Sliding scale insulin    Iron  deficiency anemia Monitor CBC transfuse as needed if symptomatic or hemoglobin below 7 -Will give dose of IV iron    Pure hypercholesterolemia -Continue Crestor  40 mg a day   Seizure disorder (HCC) -Continue phenobarbital  32.4 mg 4 times daily    Former smoker In remission   Essential hypertension -Coreg  3.125 mg p.o. twice daily     DVT prophylaxis: SCDs Start: 11/03/24 2245    Code Status: Full Code Family Communication:   Disposition Plan:  Level of care: Telemetry Status is: Inpatient   Consultants:  GI   Subjective: Denies frank blood but says his stools have been dark which he attributes to his beet intake  Objective: Vitals:   11/04/24 0715 11/04/24 0747 11/04/24 0751 11/04/24 0752  BP: (!) 140/71  132/72 132/72  Pulse:  81  84 84  Resp: 15  18   Temp:      TempSrc:      SpO2: 94% 97% 99%   Weight:      Height:        Intake/Output Summary (Last 24 hours) at 11/04/2024 0805 Last data filed at 11/04/2024 0800 Gross per 24 hour  Intake --  Output 450 ml  Net -450 ml   Filed Weights   11/03/24 2121  Weight: 108.9 kg    Examination:   General: Appearance:    Obese male in no acute distress     Lungs:     respirations unlabored  Heart:    Normal heart rate    MS:   All extremities are intact.    Neurologic:   Awake, alert       Data Reviewed: I have personally reviewed following labs and imaging studies  CBC: Recent Labs  Lab 10/28/24 1551 11/03/24 1529 11/04/24 0537  WBC 5.7 6.4 6.3  NEUTROABS 3.4 4.0  --   HGB 7.7 Repeated and verified X2.* 8.0* 7.7*  HCT 23.8 Repeated and verified X2.* 27.4* 25.2*  MCV 85.5 91.6 89.7  PLT 459.0* 442* 438*   Basic Metabolic Panel: Recent Labs  Lab 10/28/24 1551 11/03/24 1529 11/04/24 0537  NA 133* 136 139  K 4.8 5.0 4.3  CL 101 102 105  CO2 24 22 23   GLUCOSE 457* 329* 169*  BUN 27* 20 19  CREATININE 1.60* 1.66* 1.48*  CALCIUM  8.5 9.2  9.7  MG  --   --  2.5*  PHOS  --   --  3.8   GFR: Estimated Creatinine Clearance: 68 mL/min (A) (by C-G formula based on SCr of 1.48 mg/dL (H)). Liver Function Tests: Recent Labs  Lab 10/28/24 1551 11/03/24 1529 11/04/24 0537  AST 10 16 18   ALT 10 11 9   ALKPHOS 75 104 98  BILITOT 0.1* <0.2 <0.2  PROT 7.8 8.6* 8.1  ALBUMIN 4.0 4.3 4.2   No results for input(s): LIPASE, AMYLASE in the last 168 hours. No results for input(s): AMMONIA in the last 168 hours. Coagulation Profile: No results for input(s): INR, PROTIME in the last 168 hours. Cardiac Enzymes: No results for input(s): CKTOTAL, CKMB, CKMBINDEX, TROPONINI in the last 168 hours. BNP (last 3 results) No results for input(s): PROBNP in the last 8760 hours. HbA1C: No results for input(s): HGBA1C in the last 72  hours. CBG: Recent Labs  Lab 11/03/24 2149 11/04/24 0114 11/04/24 0550 11/04/24 0758  GLUCAP 236* 232* 155* 146*   Lipid Profile: No results for input(s): CHOL, HDL, LDLCALC, TRIG, CHOLHDL, LDLDIRECT in the last 72 hours. Thyroid  Function Tests: No results for input(s): TSH, T4TOTAL, FREET4, T3FREE, THYROIDAB in the last 72 hours. Anemia Panel: Recent Labs    11/03/24 2228 11/04/24 0537  VITAMINB12  --  313  FOLATE  --  8.5  FERRITIN 13*  --   TIBC 459*  --   IRON  25*  --   RETICCTPCT 2.5  --    Sepsis Labs: No results for input(s): PROCALCITON, LATICACIDVEN in the last 168 hours.  No results found for this or any previous visit (from the past 240 hours).       Radiology Studies: No results found.      Scheduled Meds:  carvedilol   3.125 mg Oral BID WC   ezetimibe   10 mg Oral Daily   insulin  aspart  0-9 Units Subcutaneous Q4H   isosorbide  mononitrate  30 mg Oral Daily   PHENobarbital   32.4 mg Oral QID   rosuvastatin   40 mg Oral Daily   sodium chloride  flush  3 mL Intravenous Q12H   Continuous Infusions:  sodium chloride        LOS: 1 day    Time spent: 45 minutes spent on chart review, discussion with nursing staff, consultants, updating family and interview/physical exam; more than 50% of that time was spent in counseling and/or coordination of care.    Harlene RAYMOND Bowl, DO [facility] Available via Epic secure chat 7am-7pm After these hours, please refer to coverage provider listed on [email or web address] 11/04/2024, 8:05 AM   "

## 2024-11-04 NOTE — ED Notes (Signed)
 ED TO INPATIENT HANDOFF REPORT  Name/Age/Gender [name] Ada 64 y.o. male  Code Status    Code Status Orders  (From admission, onward)           Start     Ordered   11/03/24 2134  Full code  Continuous       Question:  By:  Answer:  Consent: discussion documented in EHR   11/03/24 2134           Code Status History     Date Active Date Inactive Code Status Order ID Comments User Context   05/29/2015 1135 05/30/2015 2224 Full Code [id number]  Claudene Victory ORN, MD Inpatient   05/27/2015 0112 05/29/2015 1135 Full Code [id number]  Tobie Yetta HERO, MD ED   10/13/2013 0230 10/13/2013 2015 Full Code [id number]  Lonzell Emeline HERO, DO ED   11/10/2012 0516 11/10/2012 1335 Full Code [id number]  Royden Isaiah NOVAK, RN Inpatient   09/09/2011 0402 09/09/2011 1843 Full Code [id number]  Assunta Volanda Jansky, RN Inpatient       Home/SNF/Other Home  Chief Complaint Iron  deficiency anemia [D50.9]  Level of Care/Admitting Diagnosis ED Disposition     ED Disposition  Admit   Condition  --   Comment  Hospital Area: [facility] [[id number]]  Level of Care: Telemetry [5]  Admit to tele based on following criteria: Other see comments  Comments: anemia  May admit patient to [facility] or [facility] if equivalent level of care is available:: No  Diagnosis: Iron  deficiency anemia [213315]  Admitting Physician: DOUTOVA, ANASTASSIA [[id number]]  Attending Physician: DOUTOVA, ANASTASSIA [[id number]]  Certification:: I certify this patient will need inpatient services for at least 2 midnights  Expected Medical Readiness: 11/05/2024          Medical History Past Medical History:  Diagnosis Date   Anemia 01/01/2017   Ascending aorta dilation    Echocardiogram 2/22: EF 40-45, mild LVH, normal RVSF, mild to mod MR, mild to mod AI, ascending aorta 45 mm.     CAD (coronary artery disease)    a. Overlapping BMS-prox LCx in 2009 b. NSTEMI 2012 due to prox LCx ISR s/p PTCA alone; EF 45% w/ basal  inf HK. Had run out of Plavix  1 week prior. c. NSTEMI 05/2015 after running out of Plavix  - cath with 3V CAD, Patient left AMA prior to agreeing to CABG. // d. LHC 8/16: LM 60; LAD mid 60, D1 75; RI 50, 80; LCx prox stent 100; RCA prox 100; EF 45-50 >> seen by [facility] - declined CABG >> Med Rx.   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   Hypertension    Iron  deficiency 09/30/2024   MI (myocardial infarction) (HCC) x 7   Poor compliance    Seizure disorder (HCC)    Seizures (HCC)    TOBACCO ABUSE 02/22/2009  Valvular heart disease    a. Echo 05/2015: mild AI, mod MR.    Allergies Allergies[1]  IV Location/Drains/Wounds Patient Lines/Drains/Airways Status     Active Line/Drains/Airways     Name Placement date Placement time Site Days   Peripheral IV 11/03/24 20 G Right Antecubital 11/03/24  2140  Antecubital  1            Labs/Imaging Results for orders placed or performed during the hospital encounter of 11/03/24 (from the past 48 hours)  Type and screen [facility]     Status: None   Collection Time: 11/03/24  3:24 PM  Result Value Ref Range   ABO/RH(D) A POS    Antibody Screen NEG    Sample Expiration      11/06/2024,2359 Performed at [facility], [address] [place], [place] [address]   CBC with Differential     Status: Abnormal   Collection Time: 11/03/24  3:29 PM  Result Value Ref Range   WBC 6.4 4.0  - 10.5 K/uL   RBC 2.99 (L) 4.22 - 5.81 MIL/uL   Hemoglobin 8.0 (L) 13.0 - 17.0 g/dL   HCT 72.5 (L) 60.9 - 47.9 %   MCV 91.6 80.0 - 100.0 fL   MCH 26.8 26.0 - 34.0 pg   MCHC 29.2 (L) 30.0 - 36.0 g/dL   RDW 83.6 (H) 88.4 - 84.4 %   Platelets 442 (H) 150 - 400 K/uL   nRBC 0.0 0.0 - 0.2 %   Neutrophils Relative % 62 %   Neutro Abs 4.0 1.7 - 7.7 K/uL   Lymphocytes Relative 22 %   Lymphs Abs 1.4 0.7 - 4.0 K/uL   Monocytes Relative 11 %   Monocytes Absolute 0.7 0.1 - 1.0 K/uL   Eosinophils Relative 4 %   Eosinophils Absolute 0.2 0.0 - 0.5 K/uL   Basophils Relative 1 %   Basophils Absolute 0.1 0.0 - 0.1 K/uL   Immature Granulocytes 0 %   Abs Immature Granulocytes 0.02 0.00 - 0.07 K/uL    Comment: Performed at [facility], [address], [place], [place] [address]  Comprehensive metabolic panel     Status: Abnormal   Collection Time: 11/03/24  3:29 PM  Result Value Ref Range   Sodium 136 135 - 145 mmol/L   Potassium 5.0 3.5 - 5.1 mmol/L   Chloride 102 98 - 111 mmol/L   CO2 22 22 - 32 mmol/L   Glucose, Bld 329 (H) 70 - 99 mg/dL    Comment: Glucose reference range applies only to samples taken after fasting for at least 8 hours.   BUN 20 8 - 23 mg/dL   Creatinine, Ser 8.33 (H) 0.61 - 1.24 mg/dL   Calcium  9.2 8.9 - 10.3 mg/dL   Total Protein 8.6 (H) 6.5 - 8.1 g/dL   Albumin 4.3 3.5 - 5.0 g/dL   AST 16 15 - 41 U/L   ALT 11 0 - 44 U/L   Alkaline Phosphatase 104 38 - 126 U/L   Total Bilirubin <0.2 0.0 - 1.2 mg/dL   GFR, Estimated 46 (L) >60 mL/min    Comment: (NOTE) Calculated using the CKD-EPI Creatinine Equation (2021)    Anion gap 12 5 - 15    Comment: Performed at [facility], [address] [place], [place] [address]  CBG monitoring, ED     Status: Abnormal   Collection Time: 11/03/24  9:49 PM  Result Value Ref Range  Glucose-Capillary 236 (H) 70 - 99 mg/dL    Comment: Glucose reference range applies only to samples taken after  fasting for at least 8 hours.  Iron  and TIBC     Status: Abnormal   Collection Time: 11/03/24 10:28 PM  Result Value Ref Range   Iron  25 (L) 45 - 182 ug/dL   TIBC 540 (H) 749 - 549 ug/dL   Saturation Ratios 5 (L) 17.9 - 39.5 %   UIBC 435 ug/dL    Comment: Performed at [facility], [address] [place], [place] [address]  Ferritin     Status: Abnormal   Collection Time: 11/03/24 10:28 PM  Result Value Ref Range   Ferritin 13 (L) 24 - 336 ng/mL    Comment: Performed at [facility], [address] [place], [place] [address]  Reticulocytes     Status: Abnormal   Collection Time: 11/03/24 10:28 PM  Result Value Ref Range   Retic Ct Pct 2.5 0.4 - 3.1 %   RBC. 2.86 (L) 4.22 - 5.81 MIL/uL   Retic Count, Absolute 71.8 19.0 - 186.0 K/uL   Immature Retic Fract 26.0 (H) 2.3 - 15.9 %    Comment: Performed at [facility], [address], [place], [place] [address]  Phenobarbital  level     Status: None   Collection Time: 11/03/24 10:28 PM  Result Value Ref Range   Phenobarbital  19.7 15.0 - 40.0 ug/mL    Comment: Performed at [facility], [address] [place], [place] [address]  CBG monitoring, ED     Status: Abnormal   Collection Time: 11/04/24  1:14 AM  Result Value Ref Range   Glucose-Capillary 232 (H) 70 - 99 mg/dL    Comment: Glucose reference range applies only to samples taken after fasting for at least 8 hours.  Vitamin B12     Status: None   Collection Time: 11/04/24  5:37 AM  Result Value Ref Range   Vitamin B-12 313 180 - 914 pg/mL    Comment: Performed at [facility], [address], [place], [place] [address]  Folate     Status: None   Collection Time: 11/04/24  5:37 AM  Result Value Ref Range   Folate 8.5 >5.9 ng/mL    Comment: Performed at [facility], [address] [place], [place] [address]  HIV Antibody (routine testing w rflx)     Status: None   Collection  Time: 11/04/24  5:37 AM  Result Value Ref Range   HIV Screen 4th Generation wRfx Non Reactive Non Reactive    Comment: Performed at [facility], [address] [place], [place] [address]  Magnesium     Status: Abnormal   Collection Time: 11/04/24  5:37 AM  Result Value Ref Range   Magnesium 2.5 (H) 1.7 - 2.4 mg/dL    Comment: Performed at [facility], [address] [place], [place] [address]  Phosphorus     Status: None   Collection Time: 11/04/24  5:37 AM  Result Value Ref Range   Phosphorus 3.8 2.5 - 4.6 mg/dL    Comment: Performed at [facility], [address], [place], [place] [address]  Comprehensive metabolic panel     Status: Abnormal   Collection Time: 11/04/24  5:37 AM  Result Value Ref Range   Sodium 139 135 - 145 mmol/L   Potassium 4.3 3.5 - 5.1 mmol/L  Chloride 105 98 - 111 mmol/L   CO2 23 22 - 32 mmol/L   Glucose, Bld 169 (H) 70 - 99 mg/dL    Comment: Glucose reference range applies only to samples taken after fasting for at least 8 hours.   BUN 19 8 - 23 mg/dL   Creatinine, Ser 8.51 (H) 0.61 - 1.24 mg/dL   Calcium  9.7 8.9 - 10.3 mg/dL   Total Protein 8.1 6.5 - 8.1 g/dL   Albumin 4.2 3.5 - 5.0 g/dL   AST 18 15 - 41 U/L   ALT 9 0 - 44 U/L   Alkaline Phosphatase 98 38 - 126 U/L   Total Bilirubin <0.2 0.0 - 1.2 mg/dL   GFR, Estimated 53 (L) >60 mL/min    Comment: (NOTE) Calculated using the CKD-EPI Creatinine Equation (2021)    Anion gap 11 5 - 15    Comment: Performed at [facility], [address], [place], [place] [address]  CBC     Status: Abnormal   Collection Time: 11/04/24  5:37 AM  Result Value Ref Range   WBC 6.3 4.0 - 10.5 K/uL   RBC 2.81 (L) 4.22 - 5.81 MIL/uL   Hemoglobin 7.7 (L) 13.0 - 17.0 g/dL   HCT 74.7 (L) 60.9 - 47.9 %   MCV 89.7 80.0 - 100.0 fL   MCH 27.4 26.0 - 34.0 pg   MCHC 30.6 30.0 - 36.0 g/dL   RDW 83.6 (H) 88.4 - 84.4 %   Platelets 438 (H) 150 - 400 K/uL   nRBC  0.0 0.0 - 0.2 %    Comment: Performed at [facility], [address], [place], [place] [address]  CBG monitoring, ED     Status: Abnormal   Collection Time: 11/04/24  5:50 AM  Result Value Ref Range   Glucose-Capillary 155 (H) 70 - 99 mg/dL    Comment: Glucose reference range applies only to samples taken after fasting for at least 8 hours.  CBG monitoring, ED     Status: Abnormal   Collection Time: 11/04/24  7:58 AM  Result Value Ref Range   Glucose-Capillary 146 (H) 70 - 99 mg/dL    Comment: Glucose reference range applies only to samples taken after fasting for at least 8 hours.  CBG monitoring, ED     Status: Abnormal   Collection Time: 11/04/24  9:43 AM  Result Value Ref Range   Glucose-Capillary 152 (H) 70 - 99 mg/dL    Comment: Glucose reference range applies only to samples taken after fasting for at least 8 hours.  CBG monitoring, ED     Status: Abnormal   Collection Time: 11/04/24 12:37 PM  Result Value Ref Range   Glucose-Capillary 243 (H) 70 - 99 mg/dL    Comment: Glucose reference range applies only to samples taken after fasting for at least 8 hours.  CBG monitoring, ED     Status: Abnormal   Collection Time: 11/04/24  1:46 PM  Result Value Ref Range   Glucose-Capillary 215 (H) 70 - 99 mg/dL    Comment: Glucose reference range applies only to samples taken after fasting for at least 8 hours.   No results found.  Pending Labs Unresulted Labs (From admission, onward)     Start     Ordered   11/05/24 0500  CBC  Tomorrow morning,   R        11/04/24 0811   11/05/24 0500  Basic metabolic panel  Tomorrow morning,  R        11/04/24 0811            Vitals/Pain Today's Vitals   11/04/24 1330 11/04/24 1345 11/04/24 1448 11/04/24 1449  BP:   112/67   Pulse: 70 75 83 76  Resp: (!) 7 13 17 15   Temp:      TempSrc:      SpO2: 94% 100% 98% 94%  Weight:      Height:      PainSc:        Isolation Precautions No active  isolations  Medications Medications  insulin  aspart (novoLOG ) injection 0-9 Units (3 Units Subcutaneous Given 11/04/24 1352)  carvedilol  (COREG ) tablet 3.125 mg (3.125 mg Oral Given 11/04/24 0752)  ezetimibe  (ZETIA ) tablet 10 mg (10 mg Oral Given 11/04/24 1026)  isosorbide  mononitrate (IMDUR ) 24 hr tablet 30 mg (30 mg Oral Given 11/04/24 1118)  rosuvastatin  (CRESTOR ) tablet 40 mg (40 mg Oral Given 11/04/24 1025)  PHENobarbital  (LUMINAL) tablet 32.4 mg (32.4 mg Oral Given 11/04/24 1345)  acetaminophen  (TYLENOL ) tablet 650 mg (has no administration in time range)    Or  acetaminophen  (TYLENOL ) suppository 650 mg (has no administration in time range)  HYDROcodone -acetaminophen  (NORCO/VICODIN) 5-325 MG per tablet 1-2 tablet (has no administration in time range)  ondansetron  (ZOFRAN ) tablet 4 mg (has no administration in time range)    Or  ondansetron  (ZOFRAN ) injection 4 mg (has no administration in time range)  sodium chloride  flush (NS) 0.9 % injection 3 mL (3 mLs Intravenous Given 11/04/24 0945)  sodium chloride  flush (NS) 0.9 % injection 3 mL (has no administration in time range)  0.9 %  sodium chloride  infusion (has no administration in time range)  pantoprazole  (PROTONIX ) injection 40 mg (40 mg Intravenous Given 11/03/24 2140)  ferric gluconate (FERRLECIT) 125 mg in sodium chloride  0.9 % 100 mL IVPB (0 mg Intravenous Stopped 11/04/24 1142)    Mobility walks    [1]  Allergies Allergen Reactions   Metformin  And Related Diarrhea    GI Upset (vomiting, diarrhea)

## 2024-11-04 NOTE — Progress Notes (Signed)
 OT Cancellation Note  Patient Details Name: [name] MRN: [id number] DOB: [birth year]   Cancelled Treatment:    Reason Eval/Treat Not Completed: OT screened, no needs identified, will sign off  Thank you for this referral.  Tien Spooner OT/L Acute Rehabilitation Department  [phone]   11/04/2024, 10:48 AM

## 2024-11-04 NOTE — Consult Note (Signed)
 "  Referring Provider: EDP Primary Care Physician:  Norleen Lynwood ORN, MD Primary Gastroenterologist:  Dr. Legrand  Reason for Consultation:  Anemia  HPI: [name] is a 64 y.o. male with a past medical history as listed below including CAD status post NSTEMI 2012, status post STEMI 05/2015, status post numerous PCI's, total of 9 coronary stents, 10 heart attacks and cardiac arrested x 3, seizure disorder (no seizures since 09/2014) and a clotting disorder on Plavix , as well as diabetes multiple others.   He was just seen in our office on 10/28/2024 for evaluation of iron  deficiency anemia.  He is scheduled for a couple of upcoming iron  infusions.  Sent to the ED for low hemoglobin although unchanged from previous.  Hemoglobin 7.7 g, which is stable over at least the past two months.  MCV is normal, but iron  studies do indicate iron  deficiency.  Folate and vitamin B12 levels are normal although both on the low end of normal.  Patient reports occasional dark stools related to eating beets, etc., but nothing persistent or concerning.  No red blood in his stool.  No abdominal pain.  No unintentional weight loss.  PAST GI PROCEDURES:   Colonoscopy 09/07/2013 by Dr. Debrah:  Sessile polyp measuring 2 mm in size was found in the rectum, polypectomy was performed with cold forceps Mild diverticulosis was noted in the descending colon The colon mucosa was otherwise normal Path report showed 9 colonic mucosa Recall colonoscopy 10 years  Past Medical History:  Diagnosis Date   Anemia 01/01/2017   Ascending aorta dilation    Echocardiogram 2/22: EF 40-45, mild LVH, normal RVSF, mild to mod MR, mild to mod AI, ascending aorta 45 mm.     CAD (coronary artery disease)    a. Overlapping BMS-prox LCx in 2009 b. NSTEMI 2012 due to prox LCx ISR s/p PTCA alone; EF 45% w/ basal inf HK. Had run out of Plavix  1 week prior. c. NSTEMI 05/2015 after running out of Plavix  - cath with 3V CAD, Patient left AMA prior  to agreeing to CABG. // d. LHC 8/16: LM 60; LAD mid 60, D1 75; RI 50, 80; LCx prox stent 100; RCA prox 100; EF 45-50 >> seen by TCTS - declined CABG >> Med Rx.   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   Hypertension    Iron  deficiency 09/30/2024   MI (myocardial infarction) (HCC) x 7   Poor compliance    Seizure disorder (HCC)    Seizures (HCC)    TOBACCO ABUSE 02/22/2009   Valvular heart disease    a. Echo 05/2015: mild AI, mod MR.    Past Surgical History:  Procedure Laterality Date   CARDIAC CATHETERIZATION [name] 05/29/2015   Procedure: Left Heart Cath and Coronary Angiography;  Surgeon: Victory ORN Sharps, MD;  Location: [facility] INVASIVE CV LAB;  Service:  Cardiovascular;  Laterality: [name];   COLONOSCOPY     CORONARY ANGIOPLASTY  03/2011   PTCA-prox LCx ISR   CORONARY ANGIOPLASTY WITH STENT PLACEMENT  10/2007   BMS-prox LCx   LEFT HEART CATHETERIZATION WITH CORONARY ANGIOGRAM [name] 10/13/2013   Procedure: LEFT HEART CATHETERIZATION WITH CORONARY ANGIOGRAM;  Surgeon: Ozell JONETTA Fell, MD;  Location: [facility] CATH LAB;  Service: Cardiovascular;  Laterality: [name];   TEE WITHOUT CARDIOVERSION [name] 06/16/2015   Procedure: TRANSESOPHAGEAL ECHOCARDIOGRAM (TEE);  Surgeon: Maude JAYSON Emmer, MD;  Location: [facility] ENDOSCOPY;  Service: Cardiovascular;  Laterality: [name];    Prior to Admission medications  Medication Sig Start Date End Date Taking? Authorizing Provider  albuterol  (VENTOLIN  HFA) 108  (90 Base) MCG/ACT inhaler Inhale 2 puffs into the lungs every 6 (six) hours as needed for wheezing or shortness of breath. 09/03/24  Yes Norleen Lynwood ORN, MD  carvedilol  (COREG ) 3.125 MG tablet Take 1 tablet (3.125 mg total) by mouth 2 (two) times daily with a meal. 09/03/24  Yes Norleen Lynwood ORN, MD  clopidogrel  (PLAVIX ) 75 MG tablet TAKE 1 TABLET(75 MG) BY MOUTH DAILY 10/01/23  Yes Swinyer, Rosaline HERO, NP  ezetimibe  (ZETIA ) 10 MG tablet Take 1 tablet (10 mg total) by mouth daily. 12/14/23  Yes Norleen Lynwood ORN, MD  furosemide  (LASIX ) 40 MG tablet TAKE 1 TABLET BY MOUTH EVERY DAY AS NEEDED FOR EDEMA(TAKE FOR WEIGHT GAIN OVER 3 POUNDS IN 1 DAY OR LEG SWELLING) Patient taking differently: Take 40 mg by mouth daily. TAKE 1 TABLET BY MOUTH EVERY DAY AS NEEDED FOR EDEMA(TAKE FOR WEIGHT GAIN OVER 3 POUNDS IN 1 DAY OR LEG SWELLING) 04/11/21  Yes Norleen Lynwood ORN, MD  glipiZIDE  (GLUCOTROL  XL) 10 MG 24 hr tablet TAKE 2 TABLETS(20 MG) BY MOUTH DAILY WITH BREAKFAST Patient taking differently: 20 mg daily. 08/06/24  Yes Norleen Lynwood ORN, MD  iron  polysaccharides (NU-IRON ) 150 MG capsule Take 1 capsule (150 mg total) by mouth 2 (two) times daily. Patient taking differently: Take 300 mg by mouth daily. 09/07/24  Yes Norleen Lynwood ORN, MD  isosorbide  mononitrate (IMDUR ) 30 MG 24 hr tablet TAKE 1/2 TABLET BY MOUTH DAILY 12/14/23  Yes Norleen Lynwood ORN, MD  lisinopril  (ZESTRIL ) 2.5 MG tablet Take 1 tablet (2.5 mg total) by mouth daily. 12/14/23  Yes Norleen Lynwood ORN, MD  nitroGLYCERIN  (NITROSTAT ) 0.4 MG SL tablet PLACE 1 TABLET UNDER TONGUE EVERY 5 MINUTES AS NEEDED FOR CHEST PAIN. IF NO RELIEF AFTER 3 DOSES, CALL 911 11/03/23  Yes Fell Ozell, MD  pantoprazole  (PROTONIX ) 20 MG tablet Take 20 mg by mouth every morning. 10/04/24  Yes [provider]  PHENobarbital  (LUMINAL) 32.4 MG tablet TAKE 1 TABLET BY MOUTH FOUR TIMES DAILY 03/26/24  Yes Onita Duos, MD  rosuvastatin  (CRESTOR ) 40 MG tablet TAKE 1 TABLET BY MOUTH EVERY DAY 12/14/23  Yes  Norleen Lynwood ORN, MD  tamsulosin (FLOMAX) 0.4 MG CAPS capsule Take 0.4 mg by mouth daily. 09/20/24  Yes [provider]  Accu-Chek FastClix Lancets MISC USE TO CHECK BLOOD GLUCOSE DAILY 04/20/24   Norleen Lynwood ORN, MD  ACCU-CHEK GUIDE TEST test strip USE ONCE DAILY TO CHECK BLOOD SUGAR 10/12/24   Norleen Lynwood ORN, MD  Blood Glucose Monitoring Suppl DEVI 1 each by Does not apply route daily. May substitute to any manufacturer covered by patient's insurance. Dx E11.65 01/19/24   Norleen Lynwood ORN, MD  Cholecalciferol (VITAMIN D3) 50 MCG (2000 UT) capsule Take 1 capsule (2,000 Units total) by mouth daily. Patient  not taking: Reported on 11/04/2024 04/11/21   Norleen Lynwood ORN, MD  Lancets Misc. MISC 1 each by Does not apply route daily. May substitute to any manufacturer covered by patient's insurance. Dx E11.65 01/19/24   Norleen Lynwood ORN, MD    Current Facility-Administered Medications  Medication Dose Route Frequency Provider Last Rate Last Admin   0.9 %  sodium chloride  infusion  250 mL Intravenous PRN Doutova, Anastassia, MD       acetaminophen  (TYLENOL ) tablet 650 mg  650 mg Oral Q6H PRN Doutova, Anastassia, MD       Or   acetaminophen  (TYLENOL ) suppository 650 mg  650 mg Rectal Q6H PRN Doutova, Anastassia, MD       carvedilol  (COREG ) tablet 3.125 mg  3.125 mg Oral BID WC Doutova, Anastassia, MD   3.125 mg at 11/04/24 9247   ezetimibe  (ZETIA ) tablet 10 mg  10 mg Oral Daily Doutova, Anastassia, MD       ferric gluconate (FERRLECIT) 125 mg in sodium chloride  0.9 % 100 mL IVPB  125 mg Intravenous Once Vann, Aeisha Minarik U, DO       HYDROcodone -acetaminophen  (NORCO/VICODIN) 5-325 MG per tablet 1-2 tablet  1-2 tablet Oral Q4H PRN Doutova, Anastassia, MD       insulin  aspart (novoLOG ) injection 0-9 Units  0-9 Units Subcutaneous Q4H Doutova, Anastassia, MD   1 Units at 11/04/24 9367   isosorbide  mononitrate (IMDUR ) 24 hr tablet 30 mg  30 mg Oral Daily Doutova, Anastassia, MD       ondansetron  (ZOFRAN ) tablet 4 mg  4 mg  Oral Q6H PRN Doutova, Anastassia, MD       Or   ondansetron  (ZOFRAN ) injection 4 mg  4 mg Intravenous Q6H PRN Doutova, Anastassia, MD       PHENobarbital  (LUMINAL) tablet 32.4 mg  32.4 mg Oral QID Doutova, Anastassia, MD   32.4 mg at 11/03/24 2321   rosuvastatin  (CRESTOR ) tablet 40 mg  40 mg Oral Daily Doutova, Anastassia, MD       sodium chloride  flush (NS) 0.9 % injection 3 mL  3 mL Intravenous Q12H Doutova, Anastassia, MD   3 mL at 11/03/24 2322   sodium chloride  flush (NS) 0.9 % injection 3 mL  3 mL Intravenous PRN Doutova, Anastassia, MD       Current Outpatient Medications  Medication Sig Dispense Refill   albuterol  (VENTOLIN  HFA) 108 (90 Base) MCG/ACT inhaler Inhale 2 puffs into the lungs every 6 (six) hours as needed for wheezing or shortness of breath. 6.7 g 2   carvedilol  (COREG ) 3.125 MG tablet Take 1 tablet (3.125 mg total) by mouth 2 (two) times daily with a meal. 180 tablet 3   clopidogrel  (PLAVIX ) 75 MG tablet TAKE 1 TABLET(75 MG) BY MOUTH DAILY 90 tablet 3   ezetimibe  (ZETIA ) 10 MG tablet Take 1 tablet (10 mg total) by mouth daily. 90 tablet 3   furosemide  (LASIX ) 40 MG tablet TAKE 1 TABLET BY MOUTH EVERY DAY AS NEEDED FOR EDEMA(TAKE FOR WEIGHT GAIN OVER 3 POUNDS IN 1 DAY OR LEG SWELLING) (Patient taking differently: Take 40 mg by mouth daily. TAKE 1 TABLET BY MOUTH EVERY DAY AS NEEDED FOR EDEMA(TAKE FOR WEIGHT GAIN OVER 3 POUNDS IN 1 DAY OR LEG SWELLING)) 90 tablet 3   glipiZIDE  (GLUCOTROL  XL) 10 MG 24 hr tablet TAKE 2 TABLETS(20 MG) BY MOUTH DAILY WITH BREAKFAST (Patient taking differently: 20 mg daily.) 180 tablet 0   iron  polysaccharides (NU-IRON ) 150 MG capsule Take 1 capsule (150  mg total) by mouth 2 (two) times daily. (Patient taking differently: Take 300 mg by mouth daily.) 180 capsule 1   isosorbide  mononitrate (IMDUR ) 30 MG 24 hr tablet TAKE 1/2 TABLET BY MOUTH DAILY 45 tablet 3   lisinopril  (ZESTRIL ) 2.5 MG tablet Take 1 tablet (2.5 mg total) by mouth daily. 90 tablet 3    nitroGLYCERIN  (NITROSTAT ) 0.4 MG SL tablet PLACE 1 TABLET UNDER TONGUE EVERY 5 MINUTES AS NEEDED FOR CHEST PAIN. IF NO RELIEF AFTER 3 DOSES, CALL 911 25 tablet 10   pantoprazole  (PROTONIX ) 20 MG tablet Take 20 mg by mouth every morning.     PHENobarbital  (LUMINAL) 32.4 MG tablet TAKE 1 TABLET BY MOUTH FOUR TIMES DAILY 360 tablet 3   rosuvastatin  (CRESTOR ) 40 MG tablet TAKE 1 TABLET BY MOUTH EVERY DAY 90 tablet 3   tamsulosin (FLOMAX) 0.4 MG CAPS capsule Take 0.4 mg by mouth daily.     Accu-Chek FastClix Lancets MISC USE TO CHECK BLOOD GLUCOSE DAILY 102 each 0   ACCU-CHEK GUIDE TEST test strip USE ONCE DAILY TO CHECK BLOOD SUGAR 100 strip 0   Blood Glucose Monitoring Suppl DEVI 1 each by Does not apply route daily. May substitute to any manufacturer covered by patient's insurance. Dx E11.65 1 each 0   Cholecalciferol (VITAMIN D3) 50 MCG (2000 UT) capsule Take 1 capsule (2,000 Units total) by mouth daily. (Patient not taking: Reported on 11/04/2024) 90 capsule 99   Lancets Misc. MISC 1 each by Does not apply route daily. May substitute to any manufacturer covered by patient's insurance. Dx E11.65 100 each 0    Allergies as of 11/03/2024 - Review Complete 11/03/2024  Allergen Reaction Noted   Metformin  and related Diarrhea 01/29/2024    Family History  Problem Relation Age of Onset   Hypertension Brother    Atrial fibrillation Brother    Coronary artery disease Maternal Aunt    Heart disease Maternal Uncle    Stroke Maternal Uncle    Heart attack Maternal Uncle        X3   Hypertension Other    Colon cancer Neg Hx    Stomach cancer Neg Hx    Rectal cancer Neg Hx    Pancreatic cancer Neg Hx     Social History   Socioeconomic History   Marital status: Single    Spouse name: Not on file   Number of children: 0   Years of education: 12th   Highest education level: Not on file  Occupational History   Occupation: former curator  Tobacco Use   Smoking status: Former    Types:  Cigars   Smokeless tobacco: Never   Tobacco comments:    smoke cigars 2 per day  Vaping Use   Vaping status: Never Used  Substance and Sexual Activity   Alcohol use: No   Drug use: No   Sexual activity: Not Currently  Other Topics Concern   Not on file  Social History Narrative   Lives alone   Right Handed   Drinks 5-6 cups caffeine   Social Drivers of Health   Tobacco Use: Medium Risk (11/03/2024)   Patient History    Smoking Tobacco Use: Former    Smokeless Tobacco Use: Never    Passive Exposure: Not on Actuary Strain: Not on file  Food Insecurity: Not on file  Transportation Needs: Not on file  Physical Activity: Not on file  Stress: Not on file  Social Connections: Not on file  Intimate Partner Violence: Not on file  Depression (PHQ2-9): Low Risk (09/03/2024)   Depression (PHQ2-9)    PHQ-2 Score: 0  Alcohol Screen: Not on file  Housing: Not on file  Utilities: Not on file  Health Literacy: Not on file    Review of Systems: ROS is O/W negative except as mentioned in HPI.  Physical Exam: Vital signs in last 24 hours: Temp:  [97.4 F (36.3 C)-98.6 F (37 C)] 97.9 F (36.6 C) (01/15 0545) Pulse Rate:  [70-113] 79 (01/15 0845) Resp:  [8-24] 17 (01/15 0845) BP: (117-168)/(65-94) 131/74 (01/15 0845) SpO2:  [92 %-100 %] 98 % (01/15 0845) Weight:  [108.9 kg] 108.9 kg (01/14 2121)   General:   Alert,  Well-developed, well-nourished, pleasant and cooperative in NAD Head:  Normocephalic and atraumatic. Eyes:  Sclera clear, no icterus. Conjunctiva pink. Ears:  Normal auditory acuity. Mouth:  No deformity or lesions.   Lungs:  Clear throughout to auscultation.  No wheezes, crackles, or rhonchi.  Heart:  Regular rate and rhythm; no murmurs, clicks, rubs, or gallops. Abdomen:  Soft, non-distended.  Bowel sounds present.  Non-tender. Msk:  Symmetrical without gross deformities.  Pulses:  Normal pulses noted. Extremities:  Without clubbing or  edema. Neurologic:  Alert and oriented x 4;  grossly normal neurologically. Skin:  Intact without significant lesions or rashes. Psych:  Alert and cooperative. Normal mood and affect.  Intake/Output this shift: Total I/O In: -  Out: 450 [Urine:450]  Lab Results: Recent Labs    11/03/24 1529 11/04/24 0537  WBC 6.4 6.3  HGB 8.0* 7.7*  HCT 27.4* 25.2*  PLT 442* 438*   BMET Recent Labs    11/03/24 1529 11/04/24 0537  NA 136 139  K 5.0 4.3  CL 102 105  CO2 22 23  GLUCOSE 329* 169*  BUN 20 19  CREATININE 1.66* 1.48*  CALCIUM  9.2 9.7   LFT Recent Labs    11/04/24 0537  PROT 8.1  ALBUMIN 4.2  AST 18  ALT 9  ALKPHOS 98  BILITOT <0.2   IMPRESSION/PLAN:  Severe iron  deficiency anemia necessitating GI evaluation with EGD and colonoscopy to rule out gastrointestinal bleeding as the etiology in the setting of Plavix  use.  Hemoglobin low at 7.7 g, but stable over the past 2 months. - Monitor hemoglobin and transfuse for Hgb less than 7.5 grams due to cardiac disease. - Will plan for EGD and colonoscopy, timing TBD per Dr. Albertus.  Leave on clear liquids for now. - Consider iron  infusion in house.  He is scheduled for a couple of upcoming iron  infusions as outpatient though.  History of seizure disorder: - Last seizure in 2015   Heart failure and severe MR EF: Last LVEF 40-45% on recent ECHO   CAD status post numerous MIs and PCI's - Previously declined CABG, on Plavix  with last dose was 1/14 AM.  DM type II   [name]  11/04/2024, 9:10 AM      "

## 2024-11-04 NOTE — ED Notes (Signed)
 Pt was able to get himself out of bed without any assistance, pt ambulated around unit hallway, maintaining 98% RA SpO2, pt denied any shortness of breath with exertion, dizziness, weakness, NAD noted. Pt maintains steady and equal gait.

## 2024-11-04 NOTE — ED Notes (Signed)
 PT at bedside.

## 2024-11-04 NOTE — Plan of Care (Signed)

## 2024-11-04 NOTE — Progress Notes (Signed)
" °  PT Cancellation Note  Patient Details Name: [name] MRN: [id number] DOB: Mar 04, 1961   Cancelled Treatment:    Reason Eval/Treat Not Completed: PT screened, no needs identified, will sign off Patient ambulatory and no loss of balance.   Leigh Darice Norris.  Darice Leigh PT Acute Rehabilitation Services Office [phone]   11/04/2024, 10:43 AM "

## 2024-11-05 DIAGNOSIS — K3189 Other diseases of stomach and duodenum: Secondary | ICD-10-CM | POA: Diagnosis not present

## 2024-11-05 DIAGNOSIS — D509 Iron deficiency anemia, unspecified: Secondary | ICD-10-CM | POA: Diagnosis not present

## 2024-11-05 DIAGNOSIS — K635 Polyp of colon: Secondary | ICD-10-CM | POA: Diagnosis not present

## 2024-11-05 DIAGNOSIS — K2960 Other gastritis without bleeding: Secondary | ICD-10-CM | POA: Diagnosis not present

## 2024-11-05 DIAGNOSIS — Z7902 Long term (current) use of antithrombotics/antiplatelets: Secondary | ICD-10-CM | POA: Diagnosis not present

## 2024-11-05 DIAGNOSIS — K5730 Diverticulosis of large intestine without perforation or abscess without bleeding: Secondary | ICD-10-CM | POA: Diagnosis not present

## 2024-11-05 DIAGNOSIS — K648 Other hemorrhoids: Secondary | ICD-10-CM | POA: Diagnosis not present

## 2024-11-05 LAB — GLUCOSE, CAPILLARY
Glucose-Capillary: 266 mg/dL — ABNORMAL HIGH (ref 70–99)
Glucose-Capillary: 150 mg/dL — ABNORMAL HIGH (ref 70–99)
Glucose-Capillary: 123 mg/dL — ABNORMAL HIGH (ref 70–99)
Glucose-Capillary: 169 mg/dL — ABNORMAL HIGH (ref 70–99)
Glucose-Capillary: 210 mg/dL — ABNORMAL HIGH (ref 70–99)
Glucose-Capillary: 313 mg/dL — ABNORMAL HIGH (ref 70–99)
Glucose-Capillary: 221 mg/dL — ABNORMAL HIGH (ref 70–99)

## 2024-11-05 LAB — CBC
HCT: 26.5 % — ABNORMAL LOW (ref 39.0–52.0)
Hemoglobin: 7.9 g/dL — ABNORMAL LOW (ref 13.0–17.0)
MCH: 26.6 pg (ref 26.0–34.0)
MCHC: 29.8 g/dL — ABNORMAL LOW (ref 30.0–36.0)
MCV: 89.2 fL (ref 80.0–100.0)
Platelets: 425 K/uL — ABNORMAL HIGH (ref 150–400)
RBC: 2.97 MIL/uL — ABNORMAL LOW (ref 4.22–5.81)
RDW: 16.2 % — ABNORMAL HIGH (ref 11.5–15.5)
WBC: 6.6 K/uL (ref 4.0–10.5)
nRBC: 0 % (ref 0.0–0.2)

## 2024-11-05 LAB — BASIC METABOLIC PANEL WITH GFR
Anion gap: 12 (ref 5–15)
BUN: 17 mg/dL (ref 8–23)
CO2: 23 mmol/L (ref 22–32)
Calcium: 9.3 mg/dL (ref 8.9–10.3)
Chloride: 101 mmol/L (ref 98–111)
Creatinine, Ser: 1.54 mg/dL — ABNORMAL HIGH (ref 0.61–1.24)
GFR, Estimated: 51 mL/min — ABNORMAL LOW
Glucose, Bld: 119 mg/dL — ABNORMAL HIGH (ref 70–99)
Potassium: 4 mmol/L (ref 3.5–5.1)
Sodium: 136 mmol/L (ref 135–145)

## 2024-11-05 MED ORDER — NA SULFATE-K SULFATE-MG SULF 17.5-3.13-1.6 GM/177ML PO SOLN
0.5000 | Freq: Once | ORAL | Status: AC
  Administered 2024-11-05: 177 mL via ORAL
  Filled 2024-11-05: qty 1

## 2024-11-05 MED ORDER — BOOST / RESOURCE BREEZE PO LIQD CUSTOM
1.0000 | Freq: Three times a day (TID) | ORAL | Status: DC
  Administered 2024-11-05 – 2024-11-06 (×3): 1 via ORAL

## 2024-11-05 MED ORDER — NA SULFATE-K SULFATE-MG SULF 17.5-3.13-1.6 GM/177ML PO SOLN
0.5000 | Freq: Once | ORAL | Status: AC
  Administered 2024-11-06: 177 mL via ORAL

## 2024-11-05 MED ORDER — SODIUM CHLORIDE 0.9 % IV SOLN: INTRAVENOUS | Status: AC
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
## 2024-11-05 NOTE — Progress Notes (Signed)
" ° ° °  PROCEDURAL EXPEDITER PROGRESS NOTE  Patient Name: [name]  DOB:1961-06-13 Date of Admission: 11/03/2024  Date of Assessment:11/05/24   -------------------------------------------------------------------------------------------------------------------   Brief clinical summary: Pt to Endo tomorrow for upper endoscopy and colonoscopy   Orders in place:  Yes   Labs, test, and orders reviewed:  Y  Requires surgical clearance:  No  Barriers noted: [name]  -------------------------------------------------------------------------------------------------------------------  [facility], [place], [place] Please contact us  directly via secure chat (search for [facility]) or by calling us  at [phone] [facility]).  "

## 2024-11-05 NOTE — H&P (View-Only) (Signed)
" ° ° ° °  [facility] Progress Note  CC:  Anemia   Subjective:  Feels fine.  Anxious to get procedures and get out of the hospital.  Had a normal BM this AM.  Up sitting in chair.    Objective:  Vital signs in last 24 hours: Temp:  [97.4 F (36.3 C)-98.7 F (37.1 C)] 98.5 F (36.9 C) (01/16 0414) Pulse Rate:  [69-98] 83 (01/16 0414) Resp:  [7-21] 20 (01/16 0414) BP: (112-146)/(60-82) 132/82 (01/16 0818) SpO2:  [92 %-100 %] 97 % (01/16 0414) Weight:  [106.2 kg] 106.2 kg (01/15 1606) Last BM Date : 11/04/24 General:  Alert, Well-developed, in NAD Heart:  Regular rate and rhythm Pulm:  CTAB.  No W/R/R. Abdomen:  Soft, non-distended.  BS present.  Non-tender. Extremities:  Without edema. Neurologic:  Alert and oriented x 4;  grossly normal neurologically. Psych:  Alert and cooperative. Normal mood and affect.  Intake/Output from previous day: 01/15 0701 - 01/16 0700 In: 340 [P.O.:240; IV Piggyback:100] Out: 850 [Urine:850]  Lab Results: Recent Labs    11/03/24 1529 11/04/24 0537 11/05/24 0757  WBC 6.4 6.3 6.6  HGB 8.0* 7.7* 7.9*  HCT 27.4* 25.2* 26.5*  PLT 442* 438* 425*   BMET Recent Labs    11/03/24 1529 11/04/24 0537 11/05/24 0757  NA 136 139 136  K 5.0 4.3 4.0  CL 102 105 101  CO2 22 23 23   GLUCOSE 329* 169* 119*  BUN 20 19 17   CREATININE 1.66* 1.48* 1.54*  CALCIUM  9.2 9.7 9.3   LFT Recent Labs    11/04/24 0537  PROT 8.1  ALBUMIN 4.2  AST 18  ALT 9  ALKPHOS 98  BILITOT <0.2   Assessment / Plan: Severe iron  deficiency anemia necessitating GI evaluation with EGD and colonoscopy to rule out gastrointestinal bleeding as the etiology in the setting of Plavix  use.  Hemoglobin low at 7.7 g, but stable over the past 2 months.  Hgb is 7.9 grams this AM. - Monitor hemoglobin and transfuse for Hgb less than 7.5 grams due to cardiac disease. - Will plan for EGD and colonoscopy with Dr. Albertus on 1/17. - Consider iron  infusion in house.  He is  scheduled for a couple of upcoming iron  infusions as outpatient though.   History of seizure disorder: - Last seizure in 2015   Heart failure and severe MR EF: Last LVEF 40-45% on recent ECHO   CAD status post numerous MIs and PCI's - Previously declined CABG, on Plavix  with last dose ?? I do not think he is sure/is confused about when he took it last.   DM type II    LOS: 2 days   [name]. [name]  11/05/2024, 9:09 AM    "

## 2024-11-05 NOTE — Progress Notes (Signed)
" ° ° ° °  [facility] Progress Note  CC:  Anemia   Subjective:  Feels fine.  Anxious to get procedures and get out of the hospital.  Had a normal BM this AM.  Up sitting in chair.    Objective:  Vital signs in last 24 hours: Temp:  [97.4 F (36.3 C)-98.7 F (37.1 C)] 98.5 F (36.9 C) (01/16 0414) Pulse Rate:  [69-98] 83 (01/16 0414) Resp:  [7-21] 20 (01/16 0414) BP: (112-146)/(60-82) 132/82 (01/16 0818) SpO2:  [92 %-100 %] 97 % (01/16 0414) Weight:  [106.2 kg] 106.2 kg (01/15 1606) Last BM Date : 11/04/24 General:  Alert, Well-developed, in NAD Heart:  Regular rate and rhythm Pulm:  CTAB.  No W/R/R. Abdomen:  Soft, non-distended.  BS present.  Non-tender. Extremities:  Without edema. Neurologic:  Alert and oriented x 4;  grossly normal neurologically. Psych:  Alert and cooperative. Normal mood and affect.  Intake/Output from previous day: 01/15 0701 - 01/16 0700 In: 340 [P.O.:240; IV Piggyback:100] Out: 850 [Urine:850]  Lab Results: Recent Labs    11/03/24 1529 11/04/24 0537 11/05/24 0757  WBC 6.4 6.3 6.6  HGB 8.0* 7.7* 7.9*  HCT 27.4* 25.2* 26.5*  PLT 442* 438* 425*   BMET Recent Labs    11/03/24 1529 11/04/24 0537 11/05/24 0757  NA 136 139 136  K 5.0 4.3 4.0  CL 102 105 101  CO2 22 23 23   GLUCOSE 329* 169* 119*  BUN 20 19 17   CREATININE 1.66* 1.48* 1.54*  CALCIUM  9.2 9.7 9.3   LFT Recent Labs    11/04/24 0537  PROT 8.1  ALBUMIN 4.2  AST 18  ALT 9  ALKPHOS 98  BILITOT <0.2   Assessment / Plan: Severe iron  deficiency anemia necessitating GI evaluation with EGD and colonoscopy to rule out gastrointestinal bleeding as the etiology in the setting of Plavix  use.  Hemoglobin low at 7.7 g, but stable over the past 2 months.  Hgb is 7.9 grams this AM. - Monitor hemoglobin and transfuse for Hgb less than 7.5 grams due to cardiac disease. - Will plan for EGD and colonoscopy with Dr. Albertus on 1/17. - Consider iron  infusion in house.  He is  scheduled for a couple of upcoming iron  infusions as outpatient though.   History of seizure disorder: - Last seizure in 2015   Heart failure and severe MR EF: Last LVEF 40-45% on recent ECHO   CAD status post numerous MIs and PCI's - Previously declined CABG, on Plavix  with last dose ?? I do not think he is sure/is confused about when he took it last.   DM type II    LOS: 2 days   Harlene BIRCH. Gwendola Hornaday  11/05/2024, 9:09 AM    "

## 2024-11-05 NOTE — Progress Notes (Signed)
 Initial Nutrition Assessment  DOCUMENTATION CODES:   Not applicable  INTERVENTION:  Boost Breeze po TID, each supplement provides 250 kcal and 9 grams of protein Advance oral supplements with advancement of diet order post colonoscopy/EGD Encourage adequate intake of meals and supplements to meet increased nutrient needs  Monitor recommendations of GI   NUTRITION DIAGNOSIS:   Increased nutrient needs related to acute illness (symptomatic anemia, possible GI bleed) as evidenced by estimated needs.  GOAL:   Patient will meet greater than or equal to 90% of their needs  MONITOR:   PO intake, Supplement acceptance, Diet advancement  REASON FOR ASSESSMENT:   Consult Assessment of nutrition requirement/status  ASSESSMENT:   Pt with hx of CAD s/p stent placement, diabetes, seizures, IDA, CHF, and tobacco use. Admitted per MD recommendations with low hemoglobin.  Tentative plan for EGD/colonoscopy once Plavix  can be held to assess if there is an active GI bleed. GI consulted. Pt's last colonoscopy in 2014.   Pt has been receiving regular iron  infusions.   RD working remotely at time of assessment, unable to reach pt or obtain diet/wt hx. All information per chart review. Pt reports no longer using tobacco products. Reports recent increase in beets in diet but no bright red coloring in stools. Suspected upper GI bleed.  Pt currently on CLD, recommend adding supplement to boost intake while on liquids. Added Boost Breeze. Recommend Ensure if advanced to FLD.  Weight fluctuations seen in weight hx over the last year which may be related to heart failure and fluid retention. Unsure if weight at admission is UBW or dry weight.   Pt ate 100% of tray last night (while on soft diet), no documented intake today. Reports of bloating and distension per nursing.   Average Meal Completion: 1/15: 100% average intake x 1 recorded meals  Nutritionally Relevant Medications: SSI 0-9 units  q4h Suprep kit Phenobarbital    Labs reviewed: Hgb 7.9<--7.7 CBG 123-410 mg/dL J8r 6.7  Admit weight: 108.9 kg Current weight: 106.2 kg   NUTRITION - FOCUSED PHYSICAL EXAM:  Deferred to follow up, RD working remotely  Diet Order:   Diet Order             Diet NPO time specified  Diet effective midnight           Diet clear liquid Room service appropriate? Yes; Fluid consistency: Thin  Diet effective midnight                   EDUCATION NEEDS:   Not appropriate for education at this time  Skin:  Skin Assessment: Reviewed RN Assessment  Last BM:  1/15  Height:   Ht Readings from Last 1 Encounters:  11/04/24 6' 2 (1.88 m)    Weight:   Wt Readings from Last 1 Encounters:  11/04/24 106.2 kg    Ideal Body Weight:  86.36 kg  BMI:  Body mass index is 30.06 kg/m.  Estimated Nutritional Needs:   Kcal:  2100-2300  Protein:  90-110g  Fluid:  >2L    Josette Glance, MS, RDN, LDN Clinical Dietitian I Please reach out via secure chat

## 2024-11-05 NOTE — Progress Notes (Signed)
 " PROGRESS NOTE    [name]  FMW:[id number] DOB: [birth year] DOA: 11/03/2024 PCP: Norleen Lynwood ORN, MD    Brief Narrative:  Patient is a 64 year old African-American male with past medical history significant for CAD status post stenting on Plavix , DM2, history of seizure disorder, tobacco abuse, iron  deficiency anemia, systolic CHF, mitral regurgitation.  Patient was admitted with likely upper GI bleed.    Assessment and Plan: Anemia-- ? From chronic GI bleeding - Patient instructed to come to the ER by GI - GI consult placed -Will hold on transfusion for now since patient is 7.7 - Patient due for IV iron  so will dose today 11/05/2024: Possible upper GI scope tomorrow.  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     CKD (chronic kidney disease) stage 3, GFR 30-59 ml/min (HCC)  - Stable.   Coronary artery disease involving native coronary artery of native heart with angina pectoris -hold Plavix  for now so the patient can proceed to EGD and a colonoscopy if indicated by GI 11/05/2024: Likely EGD tomorrow.   Diabetes mellitus with hyperglycemia (HCC) -Sliding scale insulin    Iron  deficiency anemia Continue to monitor CBC transfuse as needed if symptomatic or hemoglobin below 7   Pure hypercholesterolemia -Continue Crestor  40 mg a day   Seizure disorder (HCC) -Continue phenobarbital  32.4 mg 4 times daily    Former smoker In remission   Essential hypertension -Coreg  3.125 mg p.o. twice daily     DVT prophylaxis: SCDs Start: 11/03/24 2245    Code Status: Full Code Family Communication:   Disposition Plan:  Level of care: Telemetry Status is: Inpatient   Consultants:  GI   Subjective: -No new complaints.  Objective: Vitals:   11/05/24 0414 11/05/24 0818 11/05/24 1421 11/05/24 1636  BP: 124/69 132/82 111/66 115/65  Pulse: 83  88 83  Resp: 20  20 16   Temp: 98.5 F (36.9 C)  98.2 F  (36.8 C) 98.1 F (36.7 C)  TempSrc:   Oral Oral  SpO2: 97%  98% 98%  Weight:      Height:        Intake/Output Summary (Last 24 hours) at 11/05/2024 1756 Last data filed at 11/04/2024 1830 Gross per 24 hour  Intake 240 ml  Output --  Net 240 ml   Filed Weights   11/03/24 2121 11/04/24 1606  Weight: 108.9 kg 106.2 kg    Examination:   General: Appearance:  Awake and alert.  Not in any distress.       Lungs:   Clear to auscultation.  Heart:   S1-S2    MS:   All extremities are intact.    Neurologic:   Awake, alert       Data Reviewed: I have personally reviewed following labs and imaging studies  CBC: Recent Labs  Lab 11/03/24 1529 11/04/24 0537 11/05/24 0757  WBC 6.4 6.3 6.6  NEUTROABS 4.0  --   --   HGB 8.0* 7.7* 7.9*  HCT 27.4* 25.2* 26.5*  MCV 91.6 89.7 89.2  PLT 442* 438* 425*   Basic Metabolic Panel: Recent Labs  Lab 11/03/24 1529 11/04/24 0537 11/05/24 0757  NA 136 139 136  K 5.0 4.3 4.0  CL 102 105 101  CO2 22 23 23   GLUCOSE 329* 169* 119*  BUN 20 19 17   CREATININE 1.66* 1.48* 1.54*  CALCIUM  9.2 9.7 9.3  MG  --  2.5*  --   PHOS  --  3.8  --    GFR: Estimated Creatinine Clearance: 64.6 mL/min (A) (by C-G formula based on SCr of 1.54 mg/dL (H)). Liver Function Tests: Recent Labs  Lab 11/03/24 1529 11/04/24 0537  AST 16 18  ALT 11 9  ALKPHOS 104 98  BILITOT <0.2 <0.2  PROT 8.6* 8.1  ALBUMIN 4.3 4.2   No results for input(s): LIPASE, AMYLASE in the last 168 hours. No results for input(s): AMMONIA in the last 168 hours. Coagulation Profile: No results for input(s): INR, PROTIME in the last 168 hours. Cardiac Enzymes: No results for input(s): CKTOTAL, CKMB, CKMBINDEX, TROPONINI in the last 168 hours. BNP (last 3 results) No results for input(s): PROBNP in the last 8760 hours. HbA1C: No results for input(s): HGBA1C in the last 72 hours. CBG: Recent Labs  Lab 11/05/24 0415 11/05/24 0724 11/05/24 1123  11/05/24 1227 11/05/24 1606  GLUCAP 150* 123* 169* 210* 313*   Lipid Profile: No results for input(s): CHOL, HDL, LDLCALC, TRIG, CHOLHDL, LDLDIRECT in the last 72 hours. Thyroid  Function Tests: No results for input(s): TSH, T4TOTAL, FREET4, T3FREE, THYROIDAB in the last 72 hours. Anemia Panel: Recent Labs    11/03/24 2228 11/04/24 0537  VITAMINB12  --  313  FOLATE  --  8.5  FERRITIN 13*  --   TIBC 459*  --   IRON  25*  --   RETICCTPCT 2.5  --    Sepsis Labs: No results for input(s): PROCALCITON, LATICACIDVEN in the last 168 hours.  No results found for this or any previous visit (from the past 240 hours).       Radiology Studies: No results found.      Scheduled Meds:  carvedilol   3.125 mg Oral BID WC   ezetimibe   10 mg Oral Daily   feeding supplement  1 Container Oral TID BM   insulin  aspart  0-9 Units Subcutaneous Q4H   isosorbide  mononitrate  30 mg Oral Daily   [START ON 11/06/2024] Na Sulfate-K Sulfate-Mg Sulfate concentrate  0.5 kit Oral Once   PHENobarbital   32.4 mg Oral QID   rosuvastatin   40 mg Oral Daily   sodium chloride  flush  3 mL Intravenous Q12H   Continuous Infusions:  sodium chloride        LOS: 2 days    Time spent: 35 Minutes.   [name], MD [facility] Available via Epic secure chat 7am-7pm After these hours, please refer to coverage provider listed on [email or web address] 11/05/2024, 5:56 PM   "

## 2024-11-05 NOTE — Plan of Care (Signed)
  Problem: Coping: Goal: Ability to adjust to condition or change in health will improve Outcome: Progressing   Problem: Fluid Volume: Goal: Ability to maintain a balanced intake and output will improve Outcome: Progressing   Problem: Health Behavior/Discharge Planning: Goal: Ability to identify and utilize available resources and services will improve Outcome: Progressing Goal: Ability to manage health-related needs will improve Outcome: Progressing   Problem: Nutritional: Goal: Maintenance of adequate nutrition will improve Outcome: Progressing   Problem: Skin Integrity: Goal: Risk for impaired skin integrity will decrease Outcome: Progressing

## 2024-11-05 NOTE — Progress Notes (Signed)
" °   11/05/24 1551  TOC Brief Assessment  Insurance and Status Reviewed  Patient has primary care physician Yes Vilinda Agent MD)  Home environment has been reviewed home alone  Prior level of function: from home alone  Prior/Current Home Services No current home services  Social Drivers of Health Review SDOH reviewed no interventions necessary  Readmission risk has been reviewed Yes  Transition of care needs no transition of care needs at this time    "

## 2024-11-06 ENCOUNTER — Encounter (HOSPITAL_COMMUNITY): Payer: Self-pay | Admitting: Internal Medicine

## 2024-11-06 ENCOUNTER — Inpatient Hospital Stay (HOSPITAL_COMMUNITY): Admitting: Anesthesiology

## 2024-11-06 ENCOUNTER — Encounter (HOSPITAL_COMMUNITY)
Admission: EM | Disposition: A | Discharge status: Home / Self Care | Payer: Self-pay | Source: Ambulatory Visit | Attending: Internal Medicine

## 2024-11-06 DIAGNOSIS — D509 Iron deficiency anemia, unspecified: Secondary | ICD-10-CM | POA: Diagnosis not present

## 2024-11-06 DIAGNOSIS — D123 Benign neoplasm of transverse colon: Secondary | ICD-10-CM

## 2024-11-06 DIAGNOSIS — K635 Polyp of colon: Secondary | ICD-10-CM

## 2024-11-06 DIAGNOSIS — I2510 Atherosclerotic heart disease of native coronary artery without angina pectoris: Secondary | ICD-10-CM | POA: Diagnosis not present

## 2024-11-06 DIAGNOSIS — K5730 Diverticulosis of large intestine without perforation or abscess without bleeding: Secondary | ICD-10-CM | POA: Diagnosis not present

## 2024-11-06 DIAGNOSIS — K2960 Other gastritis without bleeding: Secondary | ICD-10-CM | POA: Diagnosis not present

## 2024-11-06 DIAGNOSIS — K648 Other hemorrhoids: Secondary | ICD-10-CM

## 2024-11-06 DIAGNOSIS — K3189 Other diseases of stomach and duodenum: Secondary | ICD-10-CM

## 2024-11-06 DIAGNOSIS — K319 Disease of stomach and duodenum, unspecified: Secondary | ICD-10-CM

## 2024-11-06 HISTORY — PX: ESOPHAGOGASTRODUODENOSCOPY: SHX5428

## 2024-11-06 HISTORY — PX: COLONOSCOPY: SHX5424

## 2024-11-06 LAB — CBC WITH DIFFERENTIAL/PLATELET
Abs Immature Granulocytes: 0.02 K/uL (ref 0.00–0.07)
Basophils Absolute: 0.1 K/uL (ref 0.0–0.1)
Basophils Relative: 1 %
Eosinophils Absolute: 0.3 K/uL (ref 0.0–0.5)
Eosinophils Relative: 4 %
HCT: 26.6 % — ABNORMAL LOW (ref 39.0–52.0)
Hemoglobin: 8.1 g/dL — ABNORMAL LOW (ref 13.0–17.0)
Immature Granulocytes: 0 %
Lymphocytes Relative: 28 %
Lymphs Abs: 1.9 K/uL (ref 0.7–4.0)
MCH: 26.6 pg (ref 26.0–34.0)
MCHC: 30.5 g/dL (ref 30.0–36.0)
MCV: 87.2 fL (ref 80.0–100.0)
Monocytes Absolute: 0.9 K/uL (ref 0.1–1.0)
Monocytes Relative: 13 %
Neutro Abs: 3.7 K/uL (ref 1.7–7.7)
Neutrophils Relative %: 54 %
Platelets: 450 K/uL — ABNORMAL HIGH (ref 150–400)
RBC: 3.05 MIL/uL — ABNORMAL LOW (ref 4.22–5.81)
RDW: 16.1 % — ABNORMAL HIGH (ref 11.5–15.5)
WBC: 6.8 K/uL (ref 4.0–10.5)
nRBC: 0 % (ref 0.0–0.2)

## 2024-11-06 LAB — RENAL FUNCTION PANEL
Albumin: 4.5 g/dL (ref 3.5–5.0)
Anion gap: 14 (ref 5–15)
BUN: 13 mg/dL (ref 8–23)
CO2: 23 mmol/L (ref 22–32)
Calcium: 9.7 mg/dL (ref 8.9–10.3)
Chloride: 100 mmol/L (ref 98–111)
Creatinine, Ser: 1.49 mg/dL — ABNORMAL HIGH (ref 0.61–1.24)
GFR, Estimated: 53 mL/min — ABNORMAL LOW
Glucose, Bld: 132 mg/dL — ABNORMAL HIGH (ref 70–99)
Phosphorus: 3.4 mg/dL (ref 2.5–4.6)
Potassium: 4.3 mmol/L (ref 3.5–5.1)
Sodium: 137 mmol/L (ref 135–145)

## 2024-11-06 LAB — GLUCOSE, CAPILLARY
Glucose-Capillary: 120 mg/dL — ABNORMAL HIGH (ref 70–99)
Glucose-Capillary: 136 mg/dL — ABNORMAL HIGH (ref 70–99)
Glucose-Capillary: 137 mg/dL — ABNORMAL HIGH (ref 70–99)
Glucose-Capillary: 141 mg/dL — ABNORMAL HIGH (ref 70–99)

## 2024-11-06 LAB — MAGNESIUM: Magnesium: 2.2 mg/dL (ref 1.7–2.4)

## 2024-11-06 MED ORDER — GLYCOPYRROLATE 0.2 MG/ML IJ SOLN
INTRAMUSCULAR | Status: DC | PRN
  Administered 2024-11-06: .2 mg via INTRAVENOUS

## 2024-11-06 MED ORDER — SODIUM CHLORIDE 0.9 % IV SOLN: INTRAVENOUS | Status: DC

## 2024-11-06 MED ORDER — LACTATED RINGERS IV SOLN: INTRAVENOUS | Status: DC | PRN

## 2024-11-06 MED ORDER — LIDOCAINE 2% (20 MG/ML) 5 ML SYRINGE
INTRAMUSCULAR | Status: DC | PRN
  Administered 2024-11-06: 100 mg via INTRAVENOUS

## 2024-11-06 MED ORDER — PROPOFOL 10 MG/ML IV BOLUS
INTRAVENOUS | Status: DC | PRN
  Administered 2024-11-06: 50 mg via INTRAVENOUS

## 2024-11-06 MED ORDER — ONDANSETRON HCL 4 MG/2ML IJ SOLN
INTRAMUSCULAR | Status: DC | PRN
  Administered 2024-11-06: 4 mg via INTRAVENOUS

## 2024-11-06 MED ORDER — SODIUM CHLORIDE 0.9 % IV SOLN: INTRAVENOUS | Status: DC | PRN

## 2024-11-06 MED ORDER — PROPOFOL 500 MG/50ML IV EMUL
INTRAVENOUS | Status: DC | PRN
  Administered 2024-11-06: 150 ug/kg/min via INTRAVENOUS

## 2024-11-06 NOTE — Discharge Instructions (Signed)
 Follow with [name] ORN, MD in 5-7 days  Please get a complete blood count and chemistry panel checked by your Primary MD at your next visit, and again as instructed by your Primary MD. Please get your medications reviewed and adjusted by your Primary MD.  Please request your Primary MD to go over all Hospital Tests and Procedure/Radiological results at the follow up, please get all Hospital records sent to your Prim MD by signing hospital release before you go home.  In some cases, there will be blood work, cultures and biopsy results pending at the time of your discharge. Please request that your primary care M.D. goes through all the records of your hospital data and follows up on these results.  If you had Pneumonia of Lung problems at the Hospital: Please get a 2 view Chest X ray done in 6-8 weeks after hospital discharge or sooner if instructed by your Primary MD.  If you have Congestive Heart Failure: Please call your Cardiologist or Primary MD anytime you have any of the following symptoms:  1) 3 pound weight gain in 24 hours or 5 pounds in 1 week  2) shortness of breath, with or without a dry hacking cough  3) swelling in the hands, feet or stomach  4) if you have to sleep on extra pillows at night in order to breathe  Follow cardiac low salt diet and 1.5 lit/day fluid restriction.  If you have diabetes Accuchecks 4 times/day, Once in AM empty stomach and then before each meal. Log in all results and show them to your primary doctor at your next visit. If any glucose reading is under 80 or above 300 call your primary MD immediately.  If you have Seizure/Convulsions/Epilepsy: Please do not drive, operate heavy machinery, participate in activities at heights or participate in high speed sports until you have seen by Primary MD or a Neurologist and advised to do so again. Per [place]  DMV statutes, patients with seizures are not allowed to drive until they have been  seizure-free for six months.  Use caution when using heavy equipment or power tools. Avoid working on ladders or at heights. Take showers instead of baths. Ensure the water temperature is not too high on the home water heater. Do not go swimming alone. Do not lock yourself in a room alone (i.e. bathroom). When caring for infants or small children, sit down when holding, feeding, or changing them to minimize risk of injury to the child in the event you have a seizure. Maintain good sleep hygiene. Avoid alcohol.   If you had Gastrointestinal Bleeding: Please ask your Primary MD to check a complete blood count within one week of discharge or at your next visit. Your endoscopic/colonoscopic biopsies that are pending at the time of discharge, will also need to followed by your Primary MD.  Get Medicines reviewed and adjusted. Please take all your medications with you for your next visit with your Primary MD  Please request your Primary MD to go over all hospital tests and procedure/radiological results at the follow up, please ask your Primary MD to get all Hospital records sent to his/her office.  If you experience worsening of your admission symptoms, develop shortness of breath, life threatening emergency, suicidal or homicidal thoughts you must seek medical attention immediately by calling 911 or calling your MD immediately  if symptoms less severe.  You must read complete instructions/literature along with all the possible adverse reactions/side effects for all the Medicines you  take and that have been prescribed to you. Take any new Medicines after you have completely understood and accpet all the possible adverse reactions/side effects.   Do not drive or operate heavy machinery when taking Pain medications.   Do not take more than prescribed Pain, Sleep and Anxiety Medications  Special Instructions: If you have smoked or chewed Tobacco  in the last 2 yrs please stop smoking, stop any regular  Alcohol  and or any Recreational drug use.  Wear Seat belts while driving.  Please note You were cared for by a hospitalist during your hospital stay. If you have any questions about your discharge medications or the care you received while you were in the hospital after you are discharged, you can call the unit and asked to speak with the hospitalist on call if the hospitalist that took care of you is not available. Once you are discharged, your primary care physician will handle any further medical issues. Please note that NO REFILLS for any discharge medications will be authorized once you are discharged, as it is imperative that you return to your primary care physician (or establish a relationship with a primary care physician if you do not have one) for your aftercare needs so that they can reassess your need for medications and monitor your lab values.  You can reach the hospitalist office at phone [phone] or fax [phone]   If you do not have a primary care physician, you can call [phone] for a physician referral.  Activity: As tolerated with Full fall precautions use walker/cane & assistance as needed    Diet: regular  Disposition Home

## 2024-11-06 NOTE — Op Note (Addendum)
 [facility] Patient Name: [name] Procedure Date: 11/06/2024 MRN: [id number] Attending MD: Gordy CHRISTELLA Starch , MD, [id number] Date of Birth: 1960-12-03 CSN: [id number] Age: 64 Admit Type: Inpatient Procedure:                Colonoscopy Indications:              Iron  deficiency anemia Providers:                Gordy CHRISTELLA. Starch, MD, Collene Edu, RN, Haskel Chris,                            Technician Referring MD:             [facility] Medicines:                Monitored Anesthesia Care Complications:            No immediate complications. Estimated Blood Loss:     Estimated blood loss: none. Procedure:                Pre-Anesthesia Assessment:                           - Prior to the procedure, a History and Physical                            was performed, and patient medications and                            allergies were reviewed. The patient's tolerance of                            previous anesthesia was also reviewed. The risks                            and benefits of the procedure and the sedation                            options and risks were discussed with the patient.                            All questions were answered, and informed consent                            was obtained. Prior Anticoagulants: The patient has                            taken Plavix  (clopidogrel ), last dose was 5 days                            prior to procedure. ASA Grade Assessment: III - A                            patient with severe systemic disease. After  reviewing the risks and benefits, the patient was                            deemed in satisfactory condition to undergo the                            procedure.                           After obtaining informed consent, the colonoscope                            was passed under direct vision. Throughout the                            procedure, the patient's blood  pressure, pulse, and                            oxygen saturations were monitored continuously. The                            CF-HQ190L ([id number]) Olympus colonoscope was                            introduced through the anus and advanced to the                            cecum, identified by appendiceal orifice and                            ileocecal valve. Scope In: 8:30:22 AM Scope Out: 8:48:03 AM Scope Withdrawal Time: 0 hours 14 minutes 13 seconds  Total Procedure Duration: 0 hours 17 minutes 41 seconds  Findings:      The digital rectal exam was normal.      Two sessile polyps were found in the transverse colon. The polyps were 3       to 5 mm in size. These polyps were removed with a cold snare. Resection       and retrieval were complete.      A few small-mouthed diverticula were found in the sigmoid colon.      Internal hemorrhoids were found during retroflexion. The hemorrhoids       were medium-sized. Impression:               - Two 3 to 5 mm polyps in the transverse colon,                            removed with a cold snare. Resected and retrieved.                           - Mild diverticulosis in the sigmoid colon.                           - Internal hemorrhoids.                           -  No explanation for IDA. Moderate Sedation:      [name] Recommendation:           - Return patient to hospital ward for possible                            discharge same day.                           - Resume regular diet.                           - Continue present medications.                           - Resume Plavix  (clopidogrel ) at prior dose today.                            Refer to managing physician for further adjustment                            of therapy.                           - Await pathology results.                           - Proceed with outpatient IV iron  as scheduled.                           - Outpatient video capsule endoscopy can be                             arranged with Dr. Legrand to complete the GI                            evaluation of IDA.                           - Repeat colonoscopy is recommended. The                            colonoscopy date will be determined after pathology                            results from today's exam become available for                            review. Procedure Code(s):        --- Professional ---                           [phone], Colonoscopy, flexible; with removal of                            tumor(s), polyp(s), or other lesion(s) by snare  technique Diagnosis Code(s):        --- Professional ---                           D12.3, Benign neoplasm of transverse colon (hepatic                            flexure or splenic flexure)                           K64.8, Other hemorrhoids                           D50.9, Iron  deficiency anemia, unspecified                           K57.30, Diverticulosis of large intestine without                            perforation or abscess without bleeding CPT copyright 2022 [employer]. All rights reserved. The codes documented in this report are preliminary and upon coder review may  be revised to meet current compliance requirements. Gordy CHRISTELLA Starch, MD 11/06/2024 8:54:41 AM This report has been signed electronically. Number of Addenda: 0

## 2024-11-06 NOTE — Plan of Care (Signed)
  Problem: Fluid Volume: Goal: Ability to maintain a balanced intake and output will improve Outcome: Progressing   Problem: Health Behavior/Discharge Planning: Goal: Ability to manage health-related needs will improve Outcome: Progressing   Problem: Metabolic: Goal: Ability to maintain appropriate glucose levels will improve Outcome: Progressing   Problem: Skin Integrity: Goal: Risk for impaired skin integrity will decrease Outcome: Progressing   Problem: Tissue Perfusion: Goal: Adequacy of tissue perfusion will improve Outcome: Progressing

## 2024-11-06 NOTE — Plan of Care (Addendum)
 Patient discharged home. All medications and upcoming appointments explained to patient. PIV and tele removed, patient awaiting for lunch to see how well he tolerates it. No further needs atm.  Problem: Education: Goal: Ability to describe self-care measures that may prevent or decrease complications (Diabetes Survival Skills Education) will improve Outcome: Adequate for Discharge Goal: Individualized Educational Video(s) Outcome: Adequate for Discharge   Problem: Coping: Goal: Ability to adjust to condition or change in health will improve Outcome: Adequate for Discharge   Problem: Fluid Volume: Goal: Ability to maintain a balanced intake and output will improve Outcome: Adequate for Discharge   Problem: Health Behavior/Discharge Planning: Goal: Ability to identify and utilize available resources and services will improve Outcome: Adequate for Discharge Goal: Ability to manage health-related needs will improve Outcome: Adequate for Discharge   Problem: Metabolic: Goal: Ability to maintain appropriate glucose levels will improve Outcome: Adequate for Discharge   Problem: Nutritional: Goal: Maintenance of adequate nutrition will improve Outcome: Adequate for Discharge Goal: Progress toward achieving an optimal weight will improve Outcome: Adequate for Discharge   Problem: Skin Integrity: Goal: Risk for impaired skin integrity will decrease Outcome: Adequate for Discharge   Problem: Tissue Perfusion: Goal: Adequacy of tissue perfusion will improve Outcome: Adequate for Discharge   Problem: Education: Goal: Knowledge of General Education information will improve Description: Including pain rating scale, medication(s)/side effects and non-pharmacologic comfort measures Outcome: Adequate for Discharge   Problem: Health Behavior/Discharge Planning: Goal: Ability to manage health-related needs will improve Outcome: Adequate for Discharge   Problem: Clinical  Measurements: Goal: Ability to maintain clinical measurements within normal limits will improve Outcome: Adequate for Discharge Goal: Will remain free from infection Outcome: Adequate for Discharge Goal: Diagnostic test results will improve Outcome: Adequate for Discharge Goal: Respiratory complications will improve Outcome: Adequate for Discharge Goal: Cardiovascular complication will be avoided Outcome: Adequate for Discharge   Problem: Activity: Goal: Risk for activity intolerance will decrease Outcome: Adequate for Discharge   Problem: Nutrition: Goal: Adequate nutrition will be maintained Outcome: Adequate for Discharge   Problem: Coping: Goal: Level of anxiety will decrease Outcome: Adequate for Discharge   Problem: Elimination: Goal: Will not experience complications related to bowel motility Outcome: Adequate for Discharge Goal: Will not experience complications related to urinary retention Outcome: Adequate for Discharge   Problem: Pain Managment: Goal: General experience of comfort will improve and/or be controlled Outcome: Adequate for Discharge   Problem: Safety: Goal: Ability to remain free from injury will improve Outcome: Adequate for Discharge   Problem: Skin Integrity: Goal: Risk for impaired skin integrity will decrease Outcome: Adequate for Discharge

## 2024-11-06 NOTE — Transfer of Care (Signed)
 Immediate Anesthesia Transfer of Care Note  Patient: [name]  Procedure(s) Performed: EGD (ESOPHAGOGASTRODUODENOSCOPY) COLONOSCOPY  Patient Location: PACU  Anesthesia Type:MAC  Level of Consciousness: awake and alert   Airway & Oxygen Therapy: Patient Spontanous Breathing and Patient connected to nasal cannula oxygen  Post-op Assessment: Report given to RN and Post -op Vital signs reviewed and stable  Post vital signs: Reviewed and stable  Last Vitals:  Vitals Value Taken Time  BP 104/77 11/06/24 09:02  Temp 36.1 C 11/06/24 09:02  Pulse 97 11/06/24 09:05  Resp 16 11/06/24 09:05  SpO2 96 % 11/06/24 09:05  Vitals shown include unfiled device data.  Last Pain:  Vitals:   11/06/24 0902  TempSrc:   PainSc: 0-No pain         Complications: No notable events documented.

## 2024-11-06 NOTE — Anesthesia Preprocedure Evaluation (Addendum)
"                                    Anesthesia Evaluation  Patient identified by MRN, date of birth, ID band Patient awake    Reviewed: Allergy & Precautions, NPO status , Patient's Chart, lab work & pertinent test results  History of Anesthesia Complications Negative for: history of anesthetic complications  Airway Mallampati: II  TM Distance: >3 FB Neck ROM: Full    Dental  (+) Edentulous Upper, Edentulous Lower   Pulmonary former smoker   Pulmonary exam normal        Cardiovascular hypertension, Pt. on medications and Pt. on home beta blockers + CAD, + Past MI, + Cardiac Stents and +CHF  Normal cardiovascular exam+ Valvular Problems/Murmurs MR    '25 TTE - EF 40 to 45%. The left ventricular  internal cavity size was severely dilated. There is moderate concentric left ventricular hypertrophy. Grade II diastolic dysfunction (pseudonormalization). There is hypokinesis of the left ventricular, entire anterolateral wall and inferolateral wall. Left atrial size was moderately dilated. Severe mitral valve  regurgitation. Aortic valve regurgitation is mild to moderate. There is mild dilatation of the ascending aorta, measuring 43 mm.     Neuro/Psych Seizures -, Well Controlled,   negative psych ROS   GI/Hepatic negative GI ROS, Neg liver ROS,,,  Endo/Other  diabetes, Type 2, Oral Hypoglycemic Agents   Obesity   Renal/GU CRFRenal disease     Musculoskeletal negative musculoskeletal ROS (+)    Abdominal   Peds  Hematology  (+) Blood dyscrasia, anemia , REFUSES BLOOD PRODUCTS On plavix     Anesthesia Other Findings H/O noncompliance with medical treatment  Reproductive/Obstetrics                              Anesthesia Physical Anesthesia Plan  ASA: 4  Anesthesia Plan: MAC   Post-op Pain Management: Minimal or no pain anticipated   Induction:   PONV Risk Score and Plan: 1 and Propofol  infusion and Treatment may vary due to  age or medical condition  Airway Management Planned: Nasal Cannula and Natural Airway  Additional Equipment: None  Intra-op Plan:   Post-operative Plan:   Informed Consent: I have reviewed the patients History and Physical, chart, labs and discussed the procedure including the risks, benefits and alternatives for the proposed anesthesia with the patient or authorized representative who has indicated his/her understanding and acceptance.       Plan Discussed with: CRNA and Anesthesiologist  Anesthesia Plan Comments:          Anesthesia Quick Evaluation  "

## 2024-11-06 NOTE — Anesthesia Postprocedure Evaluation (Signed)
"   Anesthesia Post Note  Patient: [name]  Procedure(s) Performed: EGD (ESOPHAGOGASTRODUODENOSCOPY) COLONOSCOPY     Patient location during evaluation: PACU Anesthesia Type: MAC Level of consciousness: awake and alert Pain management: pain level controlled Vital Signs Assessment: post-procedure vital signs reviewed and stable Respiratory status: spontaneous breathing, nonlabored ventilation and respiratory function stable Cardiovascular status: stable and blood pressure returned to baseline Anesthetic complications: no   No notable events documented.  Last Vitals:  Vitals:   11/06/24 0915 11/06/24 0921  BP: 126/81 120/80  Pulse: 89 84  Resp: 12 17  Temp:  36.4 C  SpO2: 95% 98%    Last Pain:  Vitals:   11/06/24 1000  TempSrc:   PainSc: 0-No pain                 Debby FORBES Like      "

## 2024-11-06 NOTE — Interval H&P Note (Signed)
 History and Physical Interval Note: For EGD/colonoscopy this morning with MAC to evaluate iron  deficiency anemia. Plavix  has been on hold The nature of the procedure, as well as the risks, benefits, and alternatives were carefully and thoroughly reviewed with the patient. Ample time for discussion and questions allowed. The patient understood, was satisfied, and agreed to proceed.       Latest Ref Rng & Units 11/06/2024    6:27 AM 11/05/2024    7:57 AM 11/04/2024    5:37 AM  CBC  WBC 4.0 - 10.5 K/uL 6.8  6.6  6.3   Hemoglobin 13.0 - 17.0 g/dL 8.1  7.9  7.7   Hematocrit 39.0 - 52.0 % 26.6  26.5  25.2   Platelets 150 - 400 K/uL 450  425  438      11/06/2024 8:03 AM  [name]  has presented today for surgery, with the diagnosis of IDA.  The various methods of treatment have been discussed with the patient and family. After consideration of risks, benefits and other options for treatment, the patient has consented to  Procedures: EGD (ESOPHAGOGASTRODUODENOSCOPY) ([name]) COLONOSCOPY ([name]) as a surgical intervention.  The patient's history has been reviewed, patient examined, no change in status, stable for surgery.  I have reviewed the patient's chart and labs.  Questions were answered to the patient's satisfaction.     Gordy [name]

## 2024-11-06 NOTE — Discharge Summary (Signed)
 "  Physician Discharge Summary  [name] FMW:[id number] DOB: Jan 15, 1961 DOA: 11/03/2024  PCP: Norleen Lynwood ORN, MD  Admit date: 11/03/2024 Discharge date: 11/06/2024  Admitted From: home Disposition:  home  Recommendations for Outpatient Follow-up:  Follow up with PCP in 1-2 weeks Please obtain BMP/CBC in one week Follow-up with outpatient MD for IV iron  infusion  Home Health: none Equipment/Devices: none  Discharge Condition: stable CODE STATUS: Full code Diet Orders (From admission, onward)     Start     Ordered   11/06/24 0857  Diet heart healthy/carb modified Room service appropriate? Yes; Fluid consistency: Thin  Diet effective now       Question Answer Comment  Diet-HS Snack? Nothing   Room service appropriate? Yes   Fluid consistency: Thin      11/06/24 0856            HPI: 64 year old male patient of Dr. Legrand with severe iron  deficiency anemia, complicated cardiac history including CAD status post multiple prior PCI, HFrEF, severe mitral regurgitation on Plavix , diabetes, hypertension and seizure disorder admitted with iron -deficiency anemia in the setting of his complicated coronary history for upper endoscopy and colonoscopy.   Hospital Course / Discharge diagnoses: Principal problem Anemia -iron  deficiency, with concerns for GI bleeding.  Patient was seen in the GI office, had blood work done and was instructed to come to the emergency room.  He was admitted and GI was consulted.  He underwent an upper endoscopy which was fairly unremarkable, showing normal esophagus and mild erosive gastropathy with no bleeding and no stigmata of recent bleeding.  Duodenum was normal as well.  In addition, he underwent a colonoscopy which showed two 3-5 mm polyps in the transverse colon, status post removal.  His hemoglobin has remained stable while hospitalized, actually improving, and has not required any blood transfusions.  Discussed with gastroenterology, without clear  source of bleed patient is okay to resume Plavix  and okay for discharge home.  He is to continue his iron  supplementation, and it is my understanding that IV iron  has been arranged as an outpatient  Active problems  Chronic combined systolic and diastolic CHF with severe mitral regurg -this is stable, he is seen by cardiology as an outpatient, currently appears euvolemic.  Resume home medications CKD 3A -creatinine is stable, at baseline, outpatient follow-up Coronary artery disease involving native coronary artery of native heart with angina pectoris -no chest pain, this is stable.  Resume Plavix  as cleared by GI Diabetes mellitus with hyperglycemia -Resume home medications at discharge Iron  deficiency anemia - Continue to monitor CBC done outpatient, he will have iron  infusion set up  HLD - Continue home medications on discharge Seizure disorder - Continue home medications Former smoker - In remission Essential hypertension -Continue home medications  Sepsis ruled out   Discharge Instructions   Allergies as of 11/06/2024       Reactions   Metformin  And Related Diarrhea   GI Upset (vomiting, diarrhea)        Medication List     TAKE these medications    Accu-Chek FastClix Lancets Misc USE TO CHECK BLOOD GLUCOSE DAILY   Accu-Chek Guide Test test strip Generic drug: glucose blood USE ONCE DAILY TO CHECK BLOOD SUGAR   albuterol  108 (90 Base) MCG/ACT inhaler Commonly known as: VENTOLIN  HFA Inhale 2 puffs into the lungs every 6 (six) hours as needed for wheezing or shortness of breath.   Blood Glucose Monitoring Suppl Devi 1 each by Does not  apply route daily. May substitute to any manufacturer covered by patient's insurance. Dx E11.65   carvedilol  3.125 MG tablet Commonly known as: COREG  Take 1 tablet (3.125 mg total) by mouth 2 (two) times daily with a meal.   clopidogrel  75 MG tablet Commonly known as: PLAVIX  TAKE 1 TABLET(75 MG) BY MOUTH DAILY   ezetimibe  10 MG  tablet Commonly known as: ZETIA  Take 1 tablet (10 mg total) by mouth daily.   furosemide  40 MG tablet Commonly known as: LASIX  TAKE 1 TABLET BY MOUTH EVERY DAY AS NEEDED FOR EDEMA(TAKE FOR WEIGHT GAIN OVER 3 POUNDS IN 1 DAY OR LEG SWELLING) What changed:  how much to take how to take this when to take this   glipiZIDE  10 MG 24 hr tablet Commonly known as: GLUCOTROL  XL TAKE 2 TABLETS(20 MG) BY MOUTH DAILY WITH BREAKFAST What changed: See the new instructions.   iron  polysaccharides 150 MG capsule Commonly known as: Nu-Iron  Take 1 capsule (150 mg total) by mouth 2 (two) times daily. What changed:  how much to take when to take this   isosorbide  mononitrate 30 MG 24 hr tablet Commonly known as: IMDUR  TAKE 1/2 TABLET BY MOUTH DAILY   Lancets Misc. Misc 1 each by Does not apply route daily. May substitute to any manufacturer covered by patient's insurance. Dx E11.65   lisinopril  2.5 MG tablet Commonly known as: ZESTRIL  Take 1 tablet (2.5 mg total) by mouth daily.   nitroGLYCERIN  0.4 MG SL tablet Commonly known as: NITROSTAT  PLACE 1 TABLET UNDER TONGUE EVERY 5 MINUTES AS NEEDED FOR CHEST PAIN. IF NO RELIEF AFTER 3 DOSES, CALL 911   pantoprazole  20 MG tablet Commonly known as: PROTONIX  Take 20 mg by mouth every morning.   PHENobarbital  32.4 MG tablet Commonly known as: LUMINAL TAKE 1 TABLET BY MOUTH FOUR TIMES DAILY   rosuvastatin  40 MG tablet Commonly known as: CRESTOR  TAKE 1 TABLET BY MOUTH EVERY DAY   tamsulosin 0.4 MG Caps capsule Commonly known as: FLOMAX Take 0.4 mg by mouth daily.   Vitamin D3 50 MCG (2000 UT) capsule Take 1 capsule (2,000 Units total) by mouth daily.         Consultations: GI  Procedures/Studies:  ECHOCARDIOGRAM COMPLETE Result Date: 10/20/2024    ECHOCARDIOGRAM REPORT   Patient Name:   [name] Date of Exam: 10/19/2024 Medical Rec #:  [id number]      Height:       74.5 in Accession #:    [id number]     Weight:       231.0  lb Date of Birth:  Apr 03, 1961      BSA:          2.321 m Patient Age:    63 years       BP:           118/66 mmHg Patient Gender: M              HR:           85 bpm. Exam Location:  [address] Procedure: 2D Echo, 3D Echo, Cardiac Doppler and Color Doppler (Both Spectral            and Color Flow Doppler were utilized during procedure). Indications:    I50.2 CHF  History:        Patient has prior history of Echocardiogram examinations, most                 recent 12/08/2020. Ischemic Cardiomyopathy, NSTEMI, Prior  CABG,                 CKD; Risk Factors:Hypertension and HLD.  Sonographer:    Waldo Guadalajara RCS Referring Phys: [phone] MICHAEL COOPER IMPRESSIONS  1. Left ventricular ejection fraction, by estimation, is 40 to 45%. The left ventricle has mildly decreased function. The left ventricle demonstrates regional wall motion abnormalities (see scoring diagram/findings for description). The left ventricular  internal cavity size was severely dilated. There is moderate concentric left ventricular hypertrophy. Left ventricular diastolic parameters are consistent with Grade II diastolic dysfunction (pseudonormalization). There is hypokinesis of the left ventricular, entire anterolateral wall and inferolateral wall.  2. Right ventricular systolic function is normal. The right ventricular size is normal. There is normal pulmonary artery systolic pressure.  3. Left atrial size was moderately dilated.  4. The mitral valve is normal in structure. Severe mitral valve regurgitation. No evidence of mitral stenosis.  5. The aortic valve is tricuspid. Aortic valve regurgitation is mild to moderate. No aortic stenosis is present.  6. Aortic dilatation noted. There is mild dilatation of the ascending aorta, measuring 43 mm.  7. The inferior vena cava is normal in size with greater than 50% respiratory variability, suggesting right atrial pressure of 3 mmHg. Comparison(s): Changes from prior study are noted. LV is more dilated  compared to prior. Mitral regurgitation is now severe, functional due to LV dilation. FINDINGS  Left Ventricle: Left ventricular ejection fraction, by estimation, is 40 to 45%. The left ventricle has mildly decreased function. The left ventricle demonstrates regional wall motion abnormalities. The left ventricular internal cavity size was severely  dilated. There is moderate concentric left ventricular hypertrophy. Left ventricular diastolic parameters are consistent with Grade II diastolic dysfunction (pseudonormalization). Right Ventricle: The right ventricular size is normal. No increase in right ventricular wall thickness. Right ventricular systolic function is normal. There is normal pulmonary artery systolic pressure. The tricuspid regurgitant velocity is 2.27 m/s, and  with an assumed right atrial pressure of 3 mmHg, the estimated right ventricular systolic pressure is 23.6 mmHg. Left Atrium: Left atrial size was moderately dilated. Right Atrium: Right atrial size was normal in size. Pericardium: There is no evidence of pericardial effusion. Mitral Valve: The mitral valve is normal in structure. Severe mitral valve regurgitation. No evidence of mitral valve stenosis. Tricuspid Valve: The tricuspid valve is normal in structure. Tricuspid valve regurgitation is trivial. No evidence of tricuspid stenosis. Aortic Valve: The aortic valve is tricuspid. Aortic valve regurgitation is mild to moderate. Aortic regurgitation PHT measures 970 msec. No aortic stenosis is present. Pulmonic Valve: The pulmonic valve was not well visualized. Pulmonic valve regurgitation is trivial. No evidence of pulmonic stenosis. Aorta: Aortic dilatation noted. There is mild dilatation of the ascending aorta, measuring 43 mm. Venous: The inferior vena cava is normal in size with greater than 50% respiratory variability, suggesting right atrial pressure of 3 mmHg. IAS/Shunts: The atrial septum is grossly normal. Additional Comments: 3D was  performed not requiring image post processing on an independent workstation and was abnormal.  LEFT VENTRICLE PLAX 2D LVIDd:         6.75 cm Diastology LVIDs:         5.40 cm LV e' medial:    10.40 cm/s LV PW:         1.80 cm LV E/e' medial:  12.1 LV IVS:        1.44 cm LV e' lateral:   6.96 cm/s  LV E/e' lateral: 18.1                         3D Volume EF:                        3D EF:        47 %                        LV EDV:       228 ml                        LV ESV:       121 ml                        LV SV:        107 ml RIGHT VENTRICLE RV Basal diam:  3.90 cm RV Mid diam:    2.90 cm RV S prime:     15.70 cm/s TAPSE (M-mode): 2.1 cm RVSP:           23.6 mmHg LEFT ATRIUM             Index        RIGHT ATRIUM           Index LA diam:        4.60 cm 1.98 cm/m   RA Pressure: 3.00 mmHg LA Vol (A2C):   93.5 ml 40.28 ml/m  RA Area:     18.70 cm LA Vol (A4C):   80.2 ml 34.55 ml/m  RA Volume:   52.00 ml  22.40 ml/m LA Biplane Vol: 86.1 ml 37.09 ml/m  AORTIC VALVE LVOT Vmax:   89.10 cm/s LVOT Vmean:  64.100 cm/s LVOT VTI:    0.160 m AI PHT:      970 msec  AORTA Ao Root diam: 3.65 cm Ao Asc diam:  4.30 cm MITRAL VALVE                  TRICUSPID VALVE MV Area (PHT):                TR Peak grad:   20.6 mmHg MV Decel Time:                TR Vmax:        227.00 cm/s MR Peak grad:    106.9 mmHg   Estimated RAP:  3.00 mmHg MR Mean grad:    78.0 mmHg    RVSP:           23.6 mmHg MR Vmax:         517.00 cm/s MR Vmean:        418.0 cm/s   SHUNTS MR PISA:         6.28 cm     Systemic VTI: 0.16 m MR PISA Eff ROA: 38 mm MR PISA Radius:  1.00 cm MV E velocity: 126.00 cm/s MV A velocity: 111.00 cm/s MV E/A ratio:  1.14 Shelda Bruckner MD Electronically signed by Shelda Bruckner MD Signature Date/Time: 10/20/2024/11:46:39 AM    Final      Subjective: - no chest pain, shortness of breath, no abdominal pain, nausea or vomiting.   Discharge Exam: BP 120/80 (BP Location: Left Arm)   Pulse  84   Temp 97.6 F (36.4 C)   Resp 17   Ht  6' 2 (1.88 m)   Wt 106.2 kg   SpO2 98%   BMI 30.06 kg/m   General: Pt is alert, awake, not in acute distress Cardiovascular: RRR, S1/S2 +, no rubs, no gallops Respiratory: CTA bilaterally, no wheezing, no rhonchi Abdominal: Soft, NT, ND, bowel sounds + Extremities: no edema, no cyanosis    The results of significant diagnostics from this hospitalization (including imaging, microbiology, ancillary and laboratory) are listed below for reference.     Microbiology: No results found for this or any previous visit (from the past 240 hours).   Labs: Basic Metabolic Panel: Recent Labs  Lab 11/03/24 1529 11/04/24 0537 11/05/24 0757 11/06/24 0627  NA 136 139 136 137  K 5.0 4.3 4.0 4.3  CL 102 105 101 100  CO2 22 23 23 23   GLUCOSE 329* 169* 119* 132*  BUN 20 19 17 13   CREATININE 1.66* 1.48* 1.54* 1.49*  CALCIUM  9.2 9.7 9.3 9.7  MG  --  2.5*  --  2.2  PHOS  --  3.8  --  3.4   Liver Function Tests: Recent Labs  Lab 11/03/24 1529 11/04/24 0537 11/06/24 0627  AST 16 18  --   ALT 11 9  --   ALKPHOS 104 98  --   BILITOT <0.2 <0.2  --   PROT 8.6* 8.1  --   ALBUMIN 4.3 4.2 4.5   CBC: Recent Labs  Lab 11/03/24 1529 11/04/24 0537 11/05/24 0757 11/06/24 0627  WBC 6.4 6.3 6.6 6.8  NEUTROABS 4.0  --   --  3.7  HGB 8.0* 7.7* 7.9* 8.1*  HCT 27.4* 25.2* 26.5* 26.6*  MCV 91.6 89.7 89.2 87.2  PLT 442* 438* 425* 450*   CBG: Recent Labs  Lab 11/05/24 1606 11/05/24 2021 11/06/24 0023 11/06/24 0354 11/06/24 0752  GLUCAP 313* 221* 141* 120* 137*   Hgb A1c No results for input(s): HGBA1C in the last 72 hours. Lipid Profile No results for input(s): CHOL, HDL, LDLCALC, TRIG, CHOLHDL, LDLDIRECT in the last 72 hours. Thyroid  function studies No results for input(s): TSH, T4TOTAL, T3FREE, THYROIDAB in the last 72 hours.  Invalid input(s): FREET3 Urinalysis    Component Value Date/Time   COLORURINE  YELLOW 09/06/2024 1420   APPEARANCEUR CLEAR 09/06/2024 1420   LABSPEC 1.020 09/06/2024 1420   PHURINE 6.0 09/06/2024 1420   GLUCOSEU NEGATIVE 09/06/2024 1420   HGBUR NEGATIVE 09/06/2024 1420   BILIRUBINUR NEGATIVE 09/06/2024 1420   KETONESUR NEGATIVE 09/06/2024 1420   PROTEINUR NEGATIVE 11/28/2023 2304   UROBILINOGEN 1.0 09/06/2024 1420   NITRITE NEGATIVE 09/06/2024 1420   LEUKOCYTESUR NEGATIVE 09/06/2024 1420    FURTHER DISCHARGE INSTRUCTIONS:   Get Medicines reviewed and adjusted: Please take all your medications with you for your next visit with your Primary MD   Laboratory/radiological data: Please request your Primary MD to go over all hospital tests and procedure/radiological results at the follow up, please ask your Primary MD to get all Hospital records sent to his/her office.   In some cases, they will be blood work, cultures and biopsy results pending at the time of your discharge. Please request that your primary care M.D. goes through all the records of your hospital data and follows up on these results.   Also Note the following: If you experience worsening of your admission symptoms, develop shortness of breath, life threatening emergency, suicidal or homicidal thoughts you must seek medical attention immediately by calling 911 or calling your MD immediately  if symptoms less severe.  You must read complete instructions/literature along with all the possible adverse reactions/side effects for all the Medicines you take and that have been prescribed to you. Take any new Medicines after you have completely understood and accpet all the possible adverse reactions/side effects.    Do not drive when taking Pain medications or sleeping medications (Benzodaizepines)   Do not take more than prescribed Pain, Sleep and Anxiety Medications. It is not advisable to combine anxiety,sleep and pain medications without talking with your primary care practitioner   Special Instructions:  If you have smoked or chewed Tobacco  in the last 2 yrs please stop smoking, stop any regular Alcohol  and or any Recreational drug use.   Wear Seat belts while driving.   Please note: You were cared for by a hospitalist during your hospital stay. Once you are discharged, your primary care physician will handle any further medical issues. Please note that NO REFILLS for any discharge medications will be authorized once you are discharged, as it is imperative that you return to your primary care physician (or establish a relationship with a primary care physician if you do not have one) for your post hospital discharge needs so that they can reassess your need for medications and monitor your lab values.  Time coordinating discharge: 35 minutes  SIGNED:  Nilda Fendt, MD, PhD 11/06/2024, 9:47 AM   "

## 2024-11-06 NOTE — Op Note (Signed)
 [facility] Patient Name: [name] Procedure Date: 11/06/2024 MRN: [id number] Attending MD: Gordy CHRISTELLA Starch , MD, [id number] Date of Birth: 09/10/1961 CSN: [id number] Age: 64 Admit Type: Inpatient Procedure:                Upper GI endoscopy Indications:              Iron  deficiency anemia Providers:                Gordy CHRISTELLA. Starch, MD, Collene Edu, RN, Haskel Chris,                            Technician Referring MD:             [facility] Medicines:                Monitored Anesthesia Care Complications:            No immediate complications. Estimated Blood Loss:     Estimated blood loss was minimal. Procedure:                Pre-Anesthesia Assessment:                           - Prior to the procedure, a History and Physical                            was performed, and patient medications and                            allergies were reviewed. The patient's tolerance of                            previous anesthesia was also reviewed. The risks                            and benefits of the procedure and the sedation                            options and risks were discussed with the patient.                            All questions were answered, and informed consent                            was obtained. Prior Anticoagulants: The patient has                            taken Plavix  (clopidogrel ), last dose was 5 days                            prior to procedure. ASA Grade Assessment: III - A                            patient with severe systemic disease. After  reviewing the risks and benefits, the patient was                            deemed in satisfactory condition to undergo the                            procedure.                           After obtaining informed consent, the endoscope was                            passed under direct vision. Throughout the                            procedure, the  patient's blood pressure, pulse, and                            oxygen saturations were monitored continuously. The                            [id number]-H190 ([id number]) Olympus endoscope was introduced                            through the mouth, and advanced to the second part                            of duodenum. The upper GI endoscopy was                            accomplished without difficulty. The patient                            tolerated the procedure well. Scope In: Scope Out: Findings:      The examined esophagus was normal.      A few diminutive erosions with no bleeding and no stigmata of recent       bleeding were found in the prepyloric region of the stomach.      The exam of the stomach was otherwise normal.      Biopsies were taken with a cold forceps in the gastric body, at the       incisura, in the gastric antrum and in the prepyloric region of the       stomach for histology and Helicobacter pylori testing.      The examined duodenum was normal. Biopsies for histology were taken with       a cold forceps for evaluation of celiac disease. Impression:               - Normal esophagus.                           - Mild erosive gastropathy with no bleeding and no                            stigmata of recent bleeding.                           -  Normal examined duodenum. Biopsied.                           - Biopsies were taken with a cold forceps for                            histology and Helicobacter pylori testing. Moderate Sedation:      [name] Recommendation:           - Patient has a contact number available for                            emergencies. The signs and symptoms of potential                            delayed complications were discussed with the                            patient. Return to normal activities tomorrow.                            Written discharge instructions were provided to the                            patient.                            - Resume previous diet.                           - Continue present medications.                           - Await pathology results.                           - See the other procedure note for documentation of                            additional recommendations. Procedure Code(s):        --- Professional ---                           [phone], Esophagogastroduodenoscopy, flexible,                            transoral; with biopsy, single or multiple Diagnosis Code(s):        --- Professional ---                           K31.89, Other diseases of stomach and duodenum                           D50.9, Iron  deficiency anemia, unspecified CPT copyright 2022 [employer] Medical Association. All rights reserved. The codes documented in this report are preliminary and upon coder review may  be revised to meet current compliance requirements. Gordy CHRISTELLA Starch, MD 11/06/2024  8:28:16 AM This report has been signed electronically. Number of Addenda: 0

## 2024-11-07 ENCOUNTER — Encounter (HOSPITAL_COMMUNITY): Payer: Self-pay | Admitting: Internal Medicine

## 2024-11-09 ENCOUNTER — Telehealth: Payer: Self-pay | Admitting: Gastroenterology

## 2024-11-09 NOTE — Telephone Encounter (Signed)
 Understood.  Sounds like he has made his choice.  Appreciate the update  - HD

## 2024-11-09 NOTE — Telephone Encounter (Signed)
 This man with chronic iron  deficiency anemia recently had his EGD and colonoscopy done as inpatient.  No clear source of IDA found  Arrangements were made for outpatient IV iron  (has an upcoming infusion later this week)  Dr. Albertus conveyed to me the plan for outpatient video capsule study  Please arrange a capsule study at our office  H Danis

## 2024-11-09 NOTE — Telephone Encounter (Signed)
 Patient contacted. Capsule endoscopy explained. Patient declines the procedure. He will not consider it and does not want to discuss it. He feels he should have had this as an inpatient. He further states his faith in God will overcome this issue.

## 2024-11-10 ENCOUNTER — Ambulatory Visit: Payer: Self-pay | Admitting: Internal Medicine

## 2024-11-10 LAB — SURGICAL PATHOLOGY

## 2024-11-11 ENCOUNTER — Ambulatory Visit

## 2024-11-14 ENCOUNTER — Other Ambulatory Visit: Payer: Self-pay | Admitting: Cardiovascular Disease

## 2024-11-18 ENCOUNTER — Ambulatory Visit

## 2024-11-18 VITALS — BP 130/76 | HR 74 | Temp 97.4°F | Resp 16 | Ht 74.0 in | Wt 236.8 lb

## 2024-11-18 DIAGNOSIS — E611 Iron deficiency: Secondary | ICD-10-CM

## 2024-11-18 DIAGNOSIS — D509 Iron deficiency anemia, unspecified: Secondary | ICD-10-CM | POA: Diagnosis not present

## 2024-11-18 MED ORDER — SODIUM CHLORIDE 0.9 % IV SOLN
510.0000 mg | Freq: Once | INTRAVENOUS | Status: AC
  Administered 2024-11-18: 510 mg via INTRAVENOUS
  Filled 2024-11-18: qty 17

## 2024-11-18 NOTE — Progress Notes (Signed)
 Diagnosis: Iron  Deficiency Anemia  Provider:  Praveen Mannam MD  Procedure: IV Infusion  IV Type: Peripheral, IV Location: R Forearm  Feraheme  (Ferumoxytol ), Dose: 510 mg  Infusion Start Time: 1523  Infusion Stop Time: 1541  Post Infusion IV Care: Observation period completed and Peripheral IV Discontinued  Discharge: Condition: Good, Destination: Home . AVS Provided  Performed by:  Jacynda Brunke, RN

## 2024-11-25 ENCOUNTER — Ambulatory Visit

## 2024-11-25 VITALS — BP 135/67 | HR 84 | Temp 98.3°F | Resp 18 | Ht 74.0 in | Wt 240.6 lb

## 2024-11-25 DIAGNOSIS — D509 Iron deficiency anemia, unspecified: Secondary | ICD-10-CM

## 2024-11-25 DIAGNOSIS — E611 Iron deficiency: Secondary | ICD-10-CM

## 2024-11-25 MED ORDER — SODIUM CHLORIDE 0.9 % IV SOLN
510.0000 mg | Freq: Once | INTRAVENOUS | Status: AC
  Administered 2024-11-25: 510 mg via INTRAVENOUS
  Filled 2024-11-25: qty 17

## 2024-11-25 NOTE — Progress Notes (Signed)
 Diagnosis: Iron  Deficiency Anemia  Provider:  Praveen Mannam MD  Procedure: IV Infusion  IV Type: Peripheral, IV Location: R Antecubital   Feraheme  (Ferumoxytol ), Dose: 510 mg  Infusion Start Time: 1528  Infusion Stop Time: 1546  Post Infusion IV Care: Patient declined observation and Peripheral IV Discontinued  Discharge: Condition: Good, Destination: Home . AVS Provided  Performed by:  Leita FORBES Miles, LPN

## 2024-12-16 ENCOUNTER — Encounter (HOSPITAL_COMMUNITY): Payer: Self-pay

## 2024-12-16 ENCOUNTER — Ambulatory Visit (HOSPITAL_COMMUNITY): Admit: 2024-12-16 | Admitting: Gastroenterology

## 2024-12-16 SURGERY — COLONOSCOPY
Anesthesia: Monitor Anesthesia Care

## 2024-12-31 ENCOUNTER — Ambulatory Visit: Admitting: Physician Assistant

## 2025-01-04 ENCOUNTER — Ambulatory Visit: Admitting: Gastroenterology

## 2025-03-15 ENCOUNTER — Ambulatory Visit: Admitting: Neurology
